# Patient Record
Sex: Female | Born: 1980
Health system: Southern US, Community
[De-identification: ages and names within clinical notes are randomized; demographics above are authoritative.]

## PROBLEM LIST (undated history)

## (undated) ENCOUNTER — Ambulatory Visit: Admission: EM | Payer: Commercial Managed Care - PPO | Source: Home / Self Care

## (undated) DIAGNOSIS — Z87442 Personal history of urinary calculi: Secondary | ICD-10-CM

## (undated) DIAGNOSIS — N941 Unspecified dyspareunia: Secondary | ICD-10-CM

## (undated) DIAGNOSIS — N946 Dysmenorrhea, unspecified: Secondary | ICD-10-CM

## (undated) DIAGNOSIS — R87629 Unspecified abnormal cytological findings in specimens from vagina: Secondary | ICD-10-CM

## (undated) DIAGNOSIS — M329 Systemic lupus erythematosus, unspecified: Secondary | ICD-10-CM

## (undated) DIAGNOSIS — M5030 Other cervical disc degeneration, unspecified cervical region: Secondary | ICD-10-CM

## (undated) DIAGNOSIS — J383 Other diseases of vocal cords: Secondary | ICD-10-CM

## (undated) DIAGNOSIS — N920 Excessive and frequent menstruation with regular cycle: Secondary | ICD-10-CM

## (undated) DIAGNOSIS — N809 Endometriosis, unspecified: Secondary | ICD-10-CM

## (undated) DIAGNOSIS — G4730 Sleep apnea, unspecified: Secondary | ICD-10-CM

## (undated) DIAGNOSIS — R51 Headache: Secondary | ICD-10-CM

## (undated) DIAGNOSIS — Z842 Family history of other diseases of the genitourinary system: Secondary | ICD-10-CM

## (undated) DIAGNOSIS — Z720 Tobacco use: Secondary | ICD-10-CM

## (undated) DIAGNOSIS — F419 Anxiety disorder, unspecified: Secondary | ICD-10-CM

## (undated) DIAGNOSIS — T7840XA Allergy, unspecified, initial encounter: Secondary | ICD-10-CM

## (undated) DIAGNOSIS — IMO0002 Reserved for concepts with insufficient information to code with codable children: Secondary | ICD-10-CM

## (undated) DIAGNOSIS — K449 Diaphragmatic hernia without obstruction or gangrene: Secondary | ICD-10-CM

## (undated) DIAGNOSIS — A749 Chlamydial infection, unspecified: Secondary | ICD-10-CM

## (undated) DIAGNOSIS — I10 Essential (primary) hypertension: Secondary | ICD-10-CM

## (undated) DIAGNOSIS — R519 Headache, unspecified: Secondary | ICD-10-CM

## (undated) DIAGNOSIS — E669 Obesity, unspecified: Secondary | ICD-10-CM

## (undated) DIAGNOSIS — K219 Gastro-esophageal reflux disease without esophagitis: Secondary | ICD-10-CM

## (undated) DIAGNOSIS — D649 Anemia, unspecified: Secondary | ICD-10-CM

## (undated) HISTORY — DX: Family history of other diseases of the genitourinary system: Z84.2

## (undated) HISTORY — DX: Headache, unspecified: R51.9

## (undated) HISTORY — DX: Unspecified dyspareunia: N94.10

## (undated) HISTORY — PX: WISDOM TOOTH EXTRACTION: SHX21

## (undated) HISTORY — DX: Endometriosis, unspecified: N80.9

## (undated) HISTORY — DX: Dysmenorrhea, unspecified: N94.6

## (undated) HISTORY — DX: Other diseases of vocal cords: J38.3

## (undated) HISTORY — DX: Anemia, unspecified: D64.9

## (undated) HISTORY — PX: DILATION AND CURETTAGE OF UTERUS: SHX78

## (undated) HISTORY — DX: Systemic lupus erythematosus, unspecified: M32.9

## (undated) HISTORY — DX: Excessive and frequent menstruation with regular cycle: N92.0

## (undated) HISTORY — DX: Headache: R51

## (undated) HISTORY — DX: Diaphragmatic hernia without obstruction or gangrene: K44.9

## (undated) HISTORY — DX: Obesity, unspecified: E66.9

## (undated) HISTORY — DX: Tobacco use: Z72.0

## (undated) HISTORY — DX: Reserved for concepts with insufficient information to code with codable children: IMO0002

## (undated) HISTORY — DX: Sleep apnea, unspecified: G47.30

## (undated) HISTORY — DX: Personal history of urinary calculi: Z87.442

## (undated) HISTORY — DX: Gastro-esophageal reflux disease without esophagitis: K21.9

## (undated) HISTORY — PX: ROOT CANAL: SHX2363

## (undated) HISTORY — DX: Allergy, unspecified, initial encounter: T78.40XA

## (undated) HISTORY — PX: BONE MARROW BIOPSY: SHX199

## (undated) HISTORY — DX: Chlamydial infection, unspecified: A74.9

## (undated) HISTORY — DX: Anxiety disorder, unspecified: F41.9

## (undated) HISTORY — DX: Essential (primary) hypertension: I10

## (undated) HISTORY — PX: OTHER SURGICAL HISTORY: SHX169

## (undated) HISTORY — DX: Unspecified abnormal cytological findings in specimens from vagina: R87.629

## (undated) HISTORY — PX: DIAGNOSTIC LAPAROSCOPY: SUR761

## (undated) NOTE — ED Provider Notes (Signed)
 Formatting of this note is different from the original.   EMERGENCY DEPARTMENT ENCOUNTER    CHIEF COMPLAINT   Chief Complaint  Patient presents with  ? Cough   HPI    Molly Stone is a 16 y.o. female who presents with cough, wheezing over the past week.  Reports that she has a history of asthma, but was recently diagnosed with COVID-19.  Reports that her grandmother had COVID-19 and just passed away last weekend from a stroke.  Believes she got COVID from her.  Reports that she has been wheezing, has now loss of voice and worsening cough, bronchospasms.  Has not Advair inhaler which she has been using daily as prescribed.  Also has an albuterol  inhaler, but has not been using it.  Reports that she is changing physicians and was not sure if this medication is something she should take or not.  No chest pain or shortness of breath.  No nausea, vomiting or diarrhea.  No abdominal pain.  No urinary symptoms.  Patient with a history of asthma, SVT.  PAST MEDICAL HISTORY   Past Medical History:  Diagnosis Date  ? Asthma   ? SVT (supraventricular tachycardia)    SURGICAL HISTORY   No past surgical history on file.  CURRENT MEDICATIONS   Current Facility-Administered Medications  Medication Dose Route Frequency Provider Last Rate Last Admin  ? predniSONE  (DELTASONE ) tablet 50 mg  50 mg Oral Once Susy LITTIE Bihari, FNP       Current Outpatient Medications  Medication Sig Dispense Refill  ? albuterol  HFA 90 mcg/actuation inhaler Inhale 2 puffs into the lungs every 6 (six) hours as needed for Wheezing.    ? azelastine  (ASTELIN ) 137 mcg (0.1 %) nasal spray 1 spray by Nasal route 2 (two) times daily. Use in each nostril as directed    ? benzonatate  (TESSALON ) 100 MG capsule Take 1 capsule (100 mg total) by mouth every 8 (eight) hours for 7 days. 21 capsule 0  ? fluticasone  propion-salmeteroL (ADVAIR) 250-50 mcg/dose diskus inhaler Inhale 1 puff into the lungs every 12 (twelve) hours.    ?  fluticasone  propionate (FLONASE ) 50 mcg/actuation nasal spray 1 spray by Nasal route daily. Spray in each nostril.    ? montelukast  (SINGULAIR ) 10 mg tablet Take 10 mg by mouth nightly.    ? nebivoloL  (BYSTOLIC ) 10 MG tablet Take 10 mg by mouth daily.    ? pantoprazole  (PROTONIX ) 20 MG tablet Take 20 mg by mouth daily.     ALLERGIES   Allergies  Allergen Reactions  ? Buprenorphine Hcl Other (See Comments)  ? Morphine  Other (See Comments)  ? Prednisone  Other (See Comments)  ? Tramadol  Other (See Comments)   FAMILY HISTORY   No family history on file.  SOCIAL HISTORY   Social History   Socioeconomic History  ? Marital status: Married    Spouse name: Not on file  ? Number of children: Not on file  ? Years of education: Not on file  ? Highest education level: Not on file  Occupational History  ? Not on file  Tobacco Use  ? Smoking status: Not on file  ? Smokeless tobacco: Not on file  Substance and Sexual Activity  ? Alcohol use: Not on file  ? Drug use: Not on file  ? Sexual activity: Not on file  Other Topics Concern  ? Not on file  Social History Narrative  ? Not on file   Social Determinants of Health   Financial Resource Strain:  Not on file  Food Insecurity: Not on file  Transportation Needs: Not on file  Social Connections: Not on file  Housing Stability: Not on file   REVIEW OF SYSTEMS   See HPI for further details.  All other systems reviewed and otherwise negative.  PHYSICAL EXAM   VITAL SIGNS: BP 123/76   Pulse 92   Temp 98.8 F (37.1 C)   Resp 20   SpO2 95%  CONST: appears comfortable.  No distress VS are noted HEAD:   [place]/AT EYES:  PERRL.EOMI. Conjunctiva normal bilaterally. Sclera anicteric.  ENT:  NP- no discharge or epistaxis. OP-mmm. No erythema or exudate. NECK:  supple. No significant lymphadenopathy. No midline tenderness, crepitus, or step-off. CV:  RRR. No M,R,G. 2 + radial and pedal pulses bilaterally.  RESPIRATORY CHEST:  Diminished  breath sounds, wheezes to the lower lobes bilaterally.  No increased work of breathing. No chestwall TTP, crepitus, or step off. ABD:  soft, NT, ND, NABS. No HSM BACK:  No midline tenderness, crepitus, or step-off. No CVAT UPPER EXTREMITY:  No deformity, clubbing, cyanosis, edema. LOWER EXTREMITY:  No deformity, clubbing, cyanosis, edema. Equal calf circumference bilaterally. NEURO: Normal mental status, grossly non-focal. LYMPHATIC:  No cervical lymphadenopathy. SKIN:  Warm and dry. No rashes.  RADIOLOGY, LABORATORY STUDIES, AND PROCEDURES  Laboratory studies reviewed and interpreted by myself with the following notable findings:    COVID positive, influenza, RSV negative.  Strep negative.  Radiology studies reviewed and interpreted by myself with the following notable findings:    CXR:  IMPRESSION:   MINIMAL INTERSTITIAL PROMINENCE OF THE LUNG BASES.  ED COURSE & MEDICAL DECISION MAKING   14 F reports today for wheezing, cough, congestion, bronchospasm, loss of voice over the past several days.  Recently had a loss of a family member last weekend, her grandmother who was positive for COVID-19.  Patient reports she tested positive few days later.  Patient reports that she is having worsening cough, congestion, wheezing.  Felt she should be checked out.  She denies any chest pain or shortness of breath.  Has been using her Advair inhaler.  On exam patient with diminished breath sounds, few expiratory wheezes.  She is positive for COVID-19.  Have recommended to her to do her inhaler treatments at home.  She has in no distress.  She is stable.  Also discussed with her that we will give her prednisone  for the next few days for asthma exacerbation.  Also Tessalon  for cough.  Patient verbalizes understanding for return precautions, she is appreciative.  She is stable. Pertinent labs & imaging studies reviewed. (See chart for details)  Medications Provided in Emergency Department:   Prednisone   Prescriptions Provided at Discharge:  Prednisone   Social Determinants of Health/Special Considerations:  None reported  Disposition:  Home  FINAL IMPRESSION   COVID-19  Wheezing Cough   Portions of this note may be dictated using Dragon Naturally Speaking voice recognition software.  Variances in spelling and vocabulary are possible and unintentional.  Not all errors are caught/corrected.  Please notify the dino if any discrepancies are noted or if the meaning of any statement is not clear.  Susy LITTIE Bihari, FNP 01/18/23 1229  Cosigned by Lamar LITTIE Salt, DO at 01/19/2023 12:14 PM EDT Electronically signed by Susy LITTIE Bihari, FNP at 01/18/2023 12:29 PM EDT Electronically signed by Lamar LITTIE Salt, DO at 01/19/2023 12:14 PM EDT

---
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
## 2004-08-25 ENCOUNTER — Emergency Department: Payer: Self-pay | Admitting: Emergency Medicine

## 2004-08-26 ENCOUNTER — Emergency Department: Payer: Self-pay | Admitting: Emergency Medicine

## 2004-09-23 ENCOUNTER — Emergency Department: Payer: Self-pay | Admitting: Emergency Medicine

## 2004-10-01 ENCOUNTER — Emergency Department: Payer: Self-pay | Admitting: Emergency Medicine

## 2005-01-29 ENCOUNTER — Observation Stay: Payer: Self-pay

## 2005-01-30 ENCOUNTER — Observation Stay: Payer: Self-pay | Admitting: Unknown Physician Specialty

## 2005-02-11 ENCOUNTER — Observation Stay: Payer: Self-pay

## 2005-02-24 ENCOUNTER — Observation Stay: Payer: Self-pay

## 2005-03-04 ENCOUNTER — Observation Stay: Payer: Self-pay

## 2005-03-07 ENCOUNTER — Observation Stay: Payer: Self-pay

## 2005-03-16 ENCOUNTER — Observation Stay: Payer: Self-pay | Admitting: Unknown Physician Specialty

## 2005-03-22 ENCOUNTER — Observation Stay: Payer: Self-pay

## 2005-03-26 ENCOUNTER — Observation Stay: Payer: Self-pay

## 2005-04-02 ENCOUNTER — Observation Stay: Payer: Self-pay | Admitting: Unknown Physician Specialty

## 2005-04-05 ENCOUNTER — Observation Stay: Payer: Self-pay | Admitting: Obstetrics & Gynecology

## 2005-04-07 ENCOUNTER — Inpatient Hospital Stay: Payer: Self-pay | Admitting: Unknown Physician Specialty

## 2005-04-10 ENCOUNTER — Emergency Department: Payer: Self-pay | Admitting: Emergency Medicine

## 2005-04-10 ENCOUNTER — Other Ambulatory Visit: Payer: Self-pay

## 2006-03-18 ENCOUNTER — Observation Stay: Payer: Self-pay

## 2006-04-15 ENCOUNTER — Observation Stay: Payer: Self-pay

## 2006-04-18 ENCOUNTER — Inpatient Hospital Stay: Payer: Self-pay

## 2006-06-14 ENCOUNTER — Emergency Department: Payer: Self-pay | Admitting: Unknown Physician Specialty

## 2006-11-02 ENCOUNTER — Emergency Department: Payer: Self-pay | Admitting: Emergency Medicine

## 2007-01-20 ENCOUNTER — Emergency Department: Payer: Self-pay | Admitting: Emergency Medicine

## 2007-01-28 ENCOUNTER — Emergency Department: Payer: Self-pay | Admitting: General Practice

## 2007-02-19 ENCOUNTER — Other Ambulatory Visit: Payer: Self-pay

## 2007-02-19 ENCOUNTER — Emergency Department: Payer: Self-pay | Admitting: Emergency Medicine

## 2007-04-09 ENCOUNTER — Emergency Department: Payer: Self-pay | Admitting: Emergency Medicine

## 2007-05-27 ENCOUNTER — Ambulatory Visit: Payer: Self-pay

## 2007-08-14 ENCOUNTER — Ambulatory Visit: Payer: Self-pay | Admitting: Family Medicine

## 2007-08-22 ENCOUNTER — Emergency Department: Payer: Self-pay | Admitting: Emergency Medicine

## 2007-08-27 ENCOUNTER — Ambulatory Visit: Payer: Self-pay | Admitting: Family Medicine

## 2007-09-15 ENCOUNTER — Ambulatory Visit: Payer: Self-pay | Admitting: Family Medicine

## 2007-09-26 ENCOUNTER — Emergency Department: Payer: Self-pay | Admitting: Emergency Medicine

## 2008-05-31 ENCOUNTER — Emergency Department: Payer: Self-pay | Admitting: Emergency Medicine

## 2009-02-02 ENCOUNTER — Emergency Department: Payer: Self-pay | Admitting: Unknown Physician Specialty

## 2009-06-09 ENCOUNTER — Emergency Department: Payer: Self-pay | Admitting: Emergency Medicine

## 2010-08-18 ENCOUNTER — Emergency Department: Payer: Self-pay | Admitting: Unknown Physician Specialty

## 2012-03-18 ENCOUNTER — Ambulatory Visit (INDEPENDENT_AMBULATORY_CARE_PROVIDER_SITE_OTHER): Payer: BC Managed Care – PPO | Admitting: Internal Medicine

## 2012-03-18 ENCOUNTER — Encounter: Payer: Self-pay | Admitting: Internal Medicine

## 2012-03-18 VITALS — BP 120/70 | HR 110 | Temp 98.7°F | Ht 68.0 in | Wt 275.2 lb

## 2012-03-18 DIAGNOSIS — N920 Excessive and frequent menstruation with regular cycle: Secondary | ICD-10-CM

## 2012-03-18 DIAGNOSIS — N63 Unspecified lump in unspecified breast: Secondary | ICD-10-CM

## 2012-03-18 DIAGNOSIS — Z8639 Personal history of other endocrine, nutritional and metabolic disease: Secondary | ICD-10-CM

## 2012-03-18 DIAGNOSIS — Z87898 Personal history of other specified conditions: Secondary | ICD-10-CM

## 2012-03-18 DIAGNOSIS — E559 Vitamin D deficiency, unspecified: Secondary | ICD-10-CM

## 2012-03-18 DIAGNOSIS — R928 Other abnormal and inconclusive findings on diagnostic imaging of breast: Secondary | ICD-10-CM

## 2012-03-18 DIAGNOSIS — E669 Obesity, unspecified: Secondary | ICD-10-CM

## 2012-03-18 DIAGNOSIS — N6459 Other signs and symptoms in breast: Secondary | ICD-10-CM

## 2012-03-18 DIAGNOSIS — K219 Gastro-esophageal reflux disease without esophagitis: Secondary | ICD-10-CM

## 2012-03-18 DIAGNOSIS — I10 Essential (primary) hypertension: Secondary | ICD-10-CM

## 2012-03-18 NOTE — Patient Instructions (Addendum)
We will refer you to Dermatology and Dr Luella Cook following your breast mammogram.  Return for fasting lipids as soon as it is convenient        Consider the Low Glycemic Index Diet and 6 smaller meals daily .  This boosts your metabolism and regulates your sugars:   7 AM Low carbohydrate Protein  Shakes (EAS Carb Control  Or Atkins ,  Available everywhere,   In  cases at [employer] )  2.5 carbs  (Add or substitute a toasted sandwhich thin w/ peanut butter)  10 AM: Protein bar by Atkins (snack size,  Chocolate lover's variety at  [employer])    Lunch: sandwich on pita bread or flatbread (Joseph's makes a pita bread and a flat bread , available at [employer] and [employer]; Toufayah makes a low carb flatbread available at [employer] and [employer]) Mission makes a low carb whole wheat tortilla available at [employer] most grocery stores   3 PM:  Mid day :  Another protein bar,  Or a  cheese stick, 1/4 cup of almonds, walnuts, pistachios, pecans, peanuts,  Macadamia nuts  6 PM  Dinner:  "mean and green:"  Meat/chicken/fish, salad, and green veggie : use ranch, vinagrette,  Blue cheese, etc  9 PM snack : Breyer's low carb fudgsicle or  ice cream bar (Carb Smart), or  Weight Watcher's ice cream bar , or another protein shake
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
## 2012-03-19 ENCOUNTER — Other Ambulatory Visit (INDEPENDENT_AMBULATORY_CARE_PROVIDER_SITE_OTHER): Payer: BC Managed Care – PPO | Admitting: *Deleted

## 2012-03-19 ENCOUNTER — Ambulatory Visit: Payer: Self-pay | Admitting: Internal Medicine

## 2012-03-19 DIAGNOSIS — E669 Obesity, unspecified: Secondary | ICD-10-CM

## 2012-03-19 DIAGNOSIS — N920 Excessive and frequent menstruation with regular cycle: Secondary | ICD-10-CM

## 2012-03-19 DIAGNOSIS — E559 Vitamin D deficiency, unspecified: Secondary | ICD-10-CM

## 2012-03-19 LAB — CBC WITH DIFFERENTIAL/PLATELET
Basophils Relative: 0.3 % (ref 0.0–3.0)
Eosinophils Absolute: 0.9 10*3/uL — ABNORMAL HIGH (ref 0.0–0.7)
Eosinophils Relative: 11.2 % — ABNORMAL HIGH (ref 0.0–5.0)
HCT: 41 % (ref 36.0–46.0)
Hemoglobin: 13.7 g/dL (ref 12.0–15.0)
Lymphs Abs: 2.3 10*3/uL (ref 0.7–4.0)
MCHC: 33.5 g/dL (ref 30.0–36.0)
MCV: 89.4 fl (ref 78.0–100.0)
Monocytes Absolute: 0.5 10*3/uL (ref 0.1–1.0)
Neutro Abs: 4.1 10*3/uL (ref 1.4–7.7)
Neutrophils Relative %: 52.2 % (ref 43.0–77.0)
Platelets: 155 10*3/uL (ref 150.0–400.0)
WBC: 7.8 10*3/uL (ref 4.5–10.5)

## 2012-03-19 LAB — LIPID PANEL
Cholesterol: 188 mg/dL (ref 0–200)
LDL Cholesterol: 125 mg/dL — ABNORMAL HIGH (ref 0–99)
Triglycerides: 51 mg/dL (ref 0.0–149.0)
VLDL: 10.2 mg/dL (ref 0.0–40.0)

## 2012-03-19 LAB — TSH: TSH: 1.39 u[IU]/mL (ref 0.35–5.50)

## 2012-03-20 LAB — COMPLETE METABOLIC PANEL WITH GFR
ALT: 16 U/L (ref 0–35)
BUN: 11 mg/dL (ref 6–23)
CO2: 24 mEq/L (ref 19–32)
Calcium: 8.8 mg/dL (ref 8.4–10.5)
Chloride: 107 mEq/L (ref 96–112)
Creat: 0.68 mg/dL (ref 0.50–1.10)
GFR, Est African American: >89 mL/min
GFR, Est Non African American: >89 mL/min
Sodium: 140 mEq/L (ref 135–145)
Total Bilirubin: 0.4 mg/dL (ref 0.3–1.2)
Total Protein: 6.6 g/dL (ref 6.0–8.3)

## 2012-03-20 LAB — VITAMIN D 25 HYDROXY (VIT D DEFICIENCY, FRACTURES): Vit D, 25-Hydroxy: 27 ng/mL — ABNORMAL LOW (ref 30–89)

## 2012-03-21 ENCOUNTER — Encounter: Payer: Self-pay | Admitting: Internal Medicine

## 2012-03-21 DIAGNOSIS — E669 Obesity, unspecified: Secondary | ICD-10-CM | POA: Insufficient documentation

## 2012-03-22 ENCOUNTER — Encounter: Payer: Self-pay | Admitting: Internal Medicine

## 2012-03-22 DIAGNOSIS — N6459 Other signs and symptoms in breast: Secondary | ICD-10-CM | POA: Insufficient documentation

## 2012-03-22 DIAGNOSIS — K219 Gastro-esophageal reflux disease without esophagitis: Secondary | ICD-10-CM | POA: Insufficient documentation

## 2012-03-22 DIAGNOSIS — I10 Essential (primary) hypertension: Secondary | ICD-10-CM | POA: Insufficient documentation

## 2012-03-22 DIAGNOSIS — Z87442 Personal history of urinary calculi: Secondary | ICD-10-CM | POA: Insufficient documentation

## 2012-03-22 NOTE — Assessment & Plan Note (Signed)
Her Pre conception weight was 180, and she has had subsequent gain with each pregnancy .  I have addressed  BMI and recommended a low glycemic index diet utilizing smaller more frequent meals to increase metabolism.  I have also recommended that patient start exercising with a goal of 30 minutes of aerobic exercise a minimum of 5 days per week. Screening for lipid disorders, thyroid and diabetes to be done today.

## 2012-03-22 NOTE — Assessment & Plan Note (Signed)
Per patient, history of.  No signs on today's exam of hypertension.,

## 2012-03-22 NOTE — Assessment & Plan Note (Addendum)
Due to the spreading macular rash on her right breast, she was sent for diagnositc mammogram and ultrasound which were negative for massess.  She will need a skin punch biopsy to rule out both inflammatory breast cancer and other nonmalignant skin conditions.  Will refer to Dr. Anthonette Legato in [place].

## 2012-03-22 NOTE — Assessment & Plan Note (Addendum)
Recommended weight loss, avoidance of fruit juices and trial of OTC omeprazole prior to swallow evaluation

## 2012-03-23 ENCOUNTER — Other Ambulatory Visit: Payer: Self-pay | Admitting: Internal Medicine

## 2012-03-23 DIAGNOSIS — R21 Rash and other nonspecific skin eruption: Secondary | ICD-10-CM

## 2012-03-25 ENCOUNTER — Telehealth: Payer: Self-pay | Admitting: *Deleted

## 2012-03-25 NOTE — Telephone Encounter (Signed)
Patient notified

## 2012-03-25 NOTE — Telephone Encounter (Signed)
Patient called and is asking if you have seen her mamm and ultrasound from last week.

## 2012-03-25 NOTE — Telephone Encounter (Signed)
Yes they were both normal.  No masses, referral to dermatology is underway

## 2012-03-25 NOTE — Telephone Encounter (Signed)
Left message asking patient to return call. 

## 2012-03-30 ENCOUNTER — Encounter: Payer: Self-pay | Admitting: Internal Medicine

## 2012-04-07 ENCOUNTER — Telehealth: Payer: Self-pay | Admitting: Internal Medicine

## 2012-04-07 NOTE — Telephone Encounter (Signed)
Caller: Liel/Patient; PCP: Duncan Dull; CB#: [phone]; ; ; Call regarding Back Pain;  Onset- 04/06/12  Afebrile. Pt c/o of back pain. Emergent s/s of Back pain s/s protocol r/o. Pt to see the provider within 24 hrs. Call disconnected. Called pt back several times no answer left her a detailed message to call for an appt and care advice.

## 2012-04-08 ENCOUNTER — Ambulatory Visit (INDEPENDENT_AMBULATORY_CARE_PROVIDER_SITE_OTHER): Payer: BC Managed Care – PPO | Admitting: Internal Medicine

## 2012-04-08 ENCOUNTER — Encounter: Payer: Self-pay | Admitting: Internal Medicine

## 2012-04-08 VITALS — BP 118/80 | HR 98 | Temp 97.9°F | Resp 18 | Wt 274.2 lb

## 2012-04-08 DIAGNOSIS — J309 Allergic rhinitis, unspecified: Secondary | ICD-10-CM

## 2012-04-08 DIAGNOSIS — M545 Low back pain, unspecified: Secondary | ICD-10-CM | POA: Insufficient documentation

## 2012-04-08 DIAGNOSIS — N6459 Other signs and symptoms in breast: Secondary | ICD-10-CM

## 2012-04-08 DIAGNOSIS — E559 Vitamin D deficiency, unspecified: Secondary | ICD-10-CM

## 2012-04-08 MED ORDER — PREDNISONE (PAK) 10 MG PO TABS: ORAL_TABLET | ORAL | Status: AC

## 2012-04-08 MED ORDER — TRAMADOL HCL 50 MG PO TABS: 50.0000 mg | ORAL_TABLET | Freq: Four times a day (QID) | ORAL | Status: AC | PRN

## 2012-04-08 MED ORDER — METHOCARBAMOL 750 MG PO TABS: 750.0000 mg | ORAL_TABLET | Freq: Every evening | ORAL | Status: AC | PRN

## 2012-04-08 NOTE — Progress Notes (Signed)
Patient ID: Molly Stone, female   DOB: 23-Mar-1981, 31 y.o.   MRN: [id number] Patient Active Problem List  Diagnosis  . Obesity (BMI 30-39.9)  . History of nephrolithiasis  . GERD (gastroesophageal reflux disease)  . Abnormal breast exam  . Lumbago syndrome  . Rhinitis, allergic  . Vitamin d deficiency    Subjective:  CC:   Chief Complaint  Patient presents with  . Back Pain    HPI:   Molly Stone is a 31 y.o. female who presents  With new onset low back pain and follow up on multiple prior issues.  The back pain started 2 days prior to presentation.  There is no history of fall or unusual activity.  She describes it as severe, , persistent,  Dull in nature and non radiating except to both sacroiliac joints.  She has no urinary frequency or dysuria and no flank pain.  She is not constipated.  No prior episodes of LBP but used to see a chiropractor for upper back issues.  Pain is improved with lying flat on back in bed. Aggravated and becomes a sharp pain with sneeze or cough.  No cough, shortness of breath.,  No numbness in legs. 2nd issue is follow up on breast rash which was evaluated 2 weeks ago.  She has been referred to dermatology after mammogram was negative for masses, but has not seen the dermatologist yet.  The rash is still present but less bothersome. Her liver and kidney dysfunction previously noted during an ER visit were reevaluated with labs and were not present.  She was noted to be slightly low in vitamin D  Deficient/  She does have some sun exposure  And no history of fracture or falls.     Past Medical History  Diagnosis Date  . Allergy   . Asthma   . Obesity (BMI 30-39.9)   . History of nephrolithiasis   . GERD (gastroesophageal reflux disease)   . Hypertension   . Jaundice, physiologic, newborn     Past Surgical History  Procedure Date  . Dilation and curettage of uterus   . Wisdom tooth extraction          The following portions of the  patient's history were reviewed and updated as appropriate: Allergies, current medications, and problem list.    Review of Systems:  The remainder of the  review of systems was negative except those addressed in the HPI,     History   Social History  . Marital Status: Married    Spouse Name: N/A    Number of Children: N/A  . Years of Education: N/A   Occupational History  . Not on file.   Social History Main Topics  . Smoking status: Current Some Day Smoker    Types: Cigarettes  . Smokeless tobacco: Never Used  . Alcohol Use: Yes     wine occassionally  . Drug Use: No  . Sexually Active: Not on file   Other Topics Concern  . Not on file   Social History Narrative  . No narrative on file    Objective:  BP 118/80  Pulse 98  Temp 97.9 F (36.6 C) (Oral)  Resp 18  Wt 274 lb 4 oz (124.399 kg)  SpO2 97%  LMP 02/29/2012  General appearance: obese, alert, cooperative and appears stated age Neck: no adenopathy, no carotid bruit, supple, symmetrical, trachea midline and thyroid not enlarged, symmetric, no tenderness/mass/nodules Back: symmetric, some paraspinous muscle spasm bilaterally thoracolumbar area,  No vertebral tenderness.  ROM restricted.Marland Kitchen No CVA tenderness. Lungs: clear to auscultation bilaterally Heart: regular rate and rhythm, S1, S2 normal, no murmur, click, rub or gallop Abdomen: soft, non-tender; bowel sounds normal; no masses,  no organomegaly Pulses: 2+ and symmetric Skin: Skin color, texture, turgor normal. Persistent breast rash noted.   Assessment and Plan:  Lumbago syndrome She has non radiating pain, likely due to strain resulting in muscle spasm, aggravated by weight and frequent lifting of her small children.  Prednisone, MR., pain reliever.  If no improvement in 2 weeks,  Plain films.   Rhinitis, allergic She is currently having congestion, sneezing and watery eyes   Resume zyrtec.   Abnormal breast exam Mammogram/ultrasound was  negative for masses given concern for inflammatory breast disease.  She has eosinophilia, relative on recent CBC.,  Referral to dermatology for biopsy made.   Vitamin d deficiency Mild but persistent.  Recommended increasing dose of Vit d to 2000 unis daily and recheck in 3 months.    Updated Medication List Outpatient Encounter Prescriptions as of 04/08/2012  Medication Sig Dispense Refill  . cetirizine (ZYRTEC) 10 MG tablet Take 10 mg by mouth daily.      . cholecalciferol (VITAMIN D) 1000 UNITS tablet Take 1,000 Units by mouth daily.      . methocarbamol (ROBAXIN-750) 750 MG tablet Take 1 tablet (750 mg total) by mouth at bedtime as needed (muscle spasm).  30 tablet  0  . predniSONE (STERAPRED UNI-PAK) 10 MG tablet 6 tablets on Day 1 , then reduce by 1 tablet daily until gone  21 tablet  0  . traMADol (ULTRAM) 50 MG tablet Take 1 tablet (50 mg total) by mouth every 6 (six) hours as needed for pain. Maximum of 6 daily  120 tablet  0     No orders of the defined types were placed in this encounter.    Return in about 2 weeks (around 04/22/2012).

## 2012-04-08 NOTE — Patient Instructions (Addendum)
I am prescribing a prednisone taper  , a muscle relaxer for bedtime use,  And tramadol to use for pain.  You may add tylenol but not mrotrin to this  Resume zyrtec for allergies.

## 2012-04-12 ENCOUNTER — Encounter: Payer: Self-pay | Admitting: Internal Medicine

## 2012-04-12 DIAGNOSIS — J309 Allergic rhinitis, unspecified: Secondary | ICD-10-CM | POA: Insufficient documentation

## 2012-04-12 DIAGNOSIS — E559 Vitamin D deficiency, unspecified: Secondary | ICD-10-CM | POA: Insufficient documentation

## 2012-04-12 DIAGNOSIS — J3089 Other allergic rhinitis: Secondary | ICD-10-CM | POA: Insufficient documentation

## 2012-04-12 NOTE — Assessment & Plan Note (Signed)
She is currently having congestion, sneezing and watery eyes   Resume zyrtec.

## 2012-04-12 NOTE — Assessment & Plan Note (Signed)
Mammogram/ultrasound was negative for masses given concern for inflammatory breast disease.  She has eosinophilia, relative on recent CBC.,  Referral to dermatology for biopsy made.

## 2012-04-12 NOTE — Assessment & Plan Note (Signed)
She has non radiating pain, likely due to strain resulting in muscle spasm, aggravated by weight and frequent lifting of her small children.  Prednisone, MR., pain reliever.  If no improvement in 2 weeks,  Plain films.

## 2012-04-12 NOTE — Assessment & Plan Note (Signed)
Mild but persistent.  Recommended increasing dose of Vit d to 2000 unis daily and recheck in 3 months.

## 2012-04-13 ENCOUNTER — Telehealth: Payer: Self-pay | Admitting: Internal Medicine

## 2012-04-13 DIAGNOSIS — M545 Low back pain, unspecified: Secondary | ICD-10-CM

## 2012-04-13 NOTE — Telephone Encounter (Signed)
Please have her get lumbar spine films prior to appt.  Ordered in [employer]

## 2012-04-13 NOTE — Telephone Encounter (Signed)
Caller: [place]/Patient is calling about lower Back Pain not improving after meds given by MD on 6-19.  PCP [place]. Pain is more intense at night. Pt has finished Prednisone. Afebrile. Pt has f/u appt on 7-1. All emergent sxs r/o per Back Symptoms Protocol.  Advised Pt to continue w/ prescribed Tramadol, apply ice switching to heat, avoid lifting and c/b if sxs worsen or didn't improve w/n 72 hrs.  Pt verbalized understanding.

## 2012-04-13 NOTE — Telephone Encounter (Signed)
Left message notifying patient that order has been entered and that she can go to [facility] office.

## 2012-04-14 ENCOUNTER — Ambulatory Visit (INDEPENDENT_AMBULATORY_CARE_PROVIDER_SITE_OTHER)
Admission: RE | Admit: 2012-04-14 | Discharge: 2012-04-14 | Disposition: A | Discharge status: Home / Self Care | Payer: BC Managed Care – PPO | Source: Ambulatory Visit | Attending: Internal Medicine | Admitting: Internal Medicine

## 2012-04-14 DIAGNOSIS — M545 Low back pain, unspecified: Secondary | ICD-10-CM

## 2012-04-20 ENCOUNTER — Ambulatory Visit (INDEPENDENT_AMBULATORY_CARE_PROVIDER_SITE_OTHER): Payer: BC Managed Care – PPO | Admitting: Internal Medicine

## 2012-04-20 ENCOUNTER — Telehealth: Payer: Self-pay | Admitting: Internal Medicine

## 2012-04-20 ENCOUNTER — Encounter: Payer: Self-pay | Admitting: Internal Medicine

## 2012-04-20 VITALS — BP 112/76 | HR 97 | Temp 98.0°F | Resp 16 | Wt 276.5 lb

## 2012-04-20 DIAGNOSIS — E669 Obesity, unspecified: Secondary | ICD-10-CM

## 2012-04-20 DIAGNOSIS — M545 Low back pain, unspecified: Secondary | ICD-10-CM

## 2012-04-20 DIAGNOSIS — N6459 Other signs and symptoms in breast: Secondary | ICD-10-CM

## 2012-04-20 NOTE — Telephone Encounter (Signed)
Does either [place] or [place] has a Psychologist, counselling programs for non diabetics?

## 2012-04-20 NOTE — Assessment & Plan Note (Addendum)
Low glycemic index diet recommended again.  Referral to [facility] tried but she does not have diabetes,  Will look for program in [place] or [place]

## 2012-04-20 NOTE — Telephone Encounter (Signed)
YES [facility] DOES.  I WILL TRY TO SCHEDULE THE PATIENT THERE

## 2012-04-20 NOTE — Assessment & Plan Note (Addendum)
With normal  X rays,  Referred to chiropractor since she has no copay with them.  The bilateral subjective  numbness, given her pannus, may be lateral cutaneous femoral nerve syndrome, given her normal exam. .

## 2012-04-20 NOTE — Patient Instructions (Addendum)
I do recommend seeing your chiropractor for your low back and losing weight which will help,  We will call you when we get the referral to the [facility]

## 2012-04-20 NOTE — Telephone Encounter (Signed)
[facility] will not see the patient for obesity.  She has to have diabetes for them to see her.  Where else would you like me to refer her

## 2012-04-20 NOTE — Assessment & Plan Note (Signed)
Mammogram was normal and reash is resolved.  Currently appears reticular with capillary rash.  Not using any steroid cream in weeks.  Dermatology appt in July

## 2012-04-20 NOTE — Progress Notes (Signed)
Patient ID: Molly Stone, female   DOB: Mar 05, 1981, 31 y.o.   MRN: [id number]   Patient Active Problem List  Diagnosis  . Obesity (BMI 30-39.9)  . History of nephrolithiasis  . GERD (gastroesophageal reflux disease)  . Abnormal breast exam  . Lumbago syndrome  . Rhinitis, allergic  . Vitamin d deficiency    Subjective:  CC:   Chief Complaint  Patient presents with  . Follow-up    one month    HPI:   Molly Stone is a 31 y.o. female who presents for follow up on multiple chronic problems. Her back pain has resolved but she continues to have episodes of bilateral numbness in thighs.  No history of trauma, falls or malignancy.  Lumbar spine films were completely normal. 2) Breast rash has improved significantly, appointment  with dermatology was moved out to July by derm office.  Not using anything on rash currently.Marland Kitchen  3) Having trouble losing weight.  She was able to maintain a normal weight until her last pregnancy.  She has not started the ow glycemic index diet due to family stressors. No signs of diabetes or thyroid abnormalities on screening labs. Not exercising regularly due to care of 4 children but is active physically without chest pain, dyspnea.    Past Medical History  Diagnosis Date  . Allergy   . Asthma   . Obesity (BMI 30-39.9)   . History of nephrolithiasis   . GERD (gastroesophageal reflux disease)   . Hypertension   . Jaundice, physiologic, newborn     Past Surgical History  Procedure Date  . Dilation and curettage of uterus   . Wisdom tooth extraction          The following portions of the patient's history were reviewed and updated as appropriate: Allergies, current medications, and problem list.    Review of Systems:   12 Pt  review of systems was negative except those addressed in the HPI,     History   Social History  . Marital Status: Married    Spouse Name: N/A    Number of Children: N/A  . Years of Education: N/A    Occupational History  . Not on file.   Social History Main Topics  . Smoking status: Current Some Day Smoker    Types: Cigarettes  . Smokeless tobacco: Never Used  . Alcohol Use: Yes     wine occassionally  . Drug Use: No  . Sexually Active: Not on file   Other Topics Concern  . Not on file   Social History Narrative  . No narrative on file    Objective:  BP 112/76  Pulse 97  Temp 98 F (36.7 C) (Oral)  Resp 16  Wt 276 lb 8 oz (125.42 kg)  SpO2 96%  LMP 04/06/2012  General appearance: alert, cooperative and appears stated age Ears: normal TM's and external ear canals both ears Throat: lips, mucosa, and tongue normal; teeth and gums normal Neck: no adenopathy, no carotid bruit, supple, symmetrical, trachea midline and thyroid not enlarged, symmetric, no tenderness/mass/nodules Back: symmetric, no curvature. ROM normal. No CVA tenderness. Lungs: clear to auscultation bilaterally Heart: regular rate and rhythm, S1, S2 normal, no murmur, click, rub or gallop Abdomen: soft, non-tender; bowel sounds normal; no masses,  no organomegaly Pulses: 2+ and symmetric Skin: Skin color, texture, turgor normal. No rashes or lesions Lymph nodes: Cervical, supraclavicular, and axillary nodes normal. MSK:  Normal ROM of hips and knees Neuro: grossly nonfocal,  DTRS  intact   Assessment and Plan:  Obesity (BMI 30-39.9) Low glycemic index diet recommended again.  Referral to [facility] tried but she does not have diabetes,  Will look for program in [place] or [place]   Lumbago syndrome With normal  X rays,  Referred to chiropractor since she has no copay with them.  The bilateral subjective  numbness, given her pannus, may be lateral cutaneous femoral nerve syndrome, given her normal exam. .   Abnormal breast exam Mammogram was normal and reash is resolved.  Currently appears reticular with capillary rash.  Not using any steroid cream in weeks.  Dermatology appt  in July    Updated Medication List Outpatient Encounter Prescriptions as of 04/20/2012  Medication Sig Dispense Refill  . cetirizine (ZYRTEC) 10 MG tablet Take 10 mg by mouth daily.      . cholecalciferol (VITAMIN D) 1000 UNITS tablet Take 1,000 Units by mouth daily.         Orders Placed This Encounter  Procedures  . Amb ref to Medical Nutrition Therapy-MNT    No Follow-up on file.

## 2012-05-19 ENCOUNTER — Ambulatory Visit: Payer: BC Managed Care – PPO | Admitting: *Deleted

## 2012-05-20 ENCOUNTER — Encounter: Payer: Self-pay | Admitting: *Deleted

## 2012-10-09 ENCOUNTER — Ambulatory Visit (INDEPENDENT_AMBULATORY_CARE_PROVIDER_SITE_OTHER): Payer: BC Managed Care – PPO | Admitting: Internal Medicine

## 2012-10-09 ENCOUNTER — Encounter: Payer: Self-pay | Admitting: Internal Medicine

## 2012-10-09 VITALS — BP 118/68 | HR 83 | Temp 98.3°F | Resp 12 | Ht 69.5 in | Wt 285.8 lb

## 2012-10-09 DIAGNOSIS — J0110 Acute frontal sinusitis, unspecified: Secondary | ICD-10-CM

## 2012-10-09 DIAGNOSIS — J019 Acute sinusitis, unspecified: Secondary | ICD-10-CM | POA: Insufficient documentation

## 2012-10-09 DIAGNOSIS — J029 Acute pharyngitis, unspecified: Secondary | ICD-10-CM

## 2012-10-09 MED ORDER — PREDNISONE (PAK) 10 MG PO TABS: ORAL_TABLET | ORAL | Status: DC

## 2012-10-09 MED ORDER — LEVOFLOXACIN 500 MG PO TABS: 500.0000 mg | ORAL_TABLET | Freq: Every day | ORAL | Status: DC

## 2012-10-09 MED ORDER — METHYLPREDNISOLONE ACETATE 40 MG/ML IJ SUSP
40.0000 mg | Freq: Once | INTRAMUSCULAR | Status: AC
  Administered 2012-10-09: 40 mg via INTRAMUSCULAR

## 2012-10-09 NOTE — Progress Notes (Signed)
Patient ID: Molly Stone, female   DOB: 1981-01-13, 31 y.o.   MRN: [id number]  Patient Active Problem List  Diagnosis  . Obesity (BMI 30-39.9)  . History of nephrolithiasis  . GERD (gastroesophageal reflux disease)  . Abnormal breast exam  . Lumbago syndrome  . Rhinitis, allergic  . Vitamin d deficiency  . Sinusitis, acute frontal    Subjective:  CC:   Chief Complaint  Patient presents with  . Sore Throat    HPI:   Molly Collinsis a 31 y.o. female who presents with Malaise, cough and headache for several weeks, Development of  Purulent  bloody nasal discharge over the last 72 hours.    Past Medical History  Diagnosis Date  . Allergy   . Asthma   . Obesity (BMI 30-39.9)   . History of nephrolithiasis   . GERD (gastroesophageal reflux disease)   . Hypertension   . Jaundice, physiologic, newborn     Past Surgical History  Procedure Date  . Dilation and curettage of uterus   . Wisdom tooth extraction          The following portions of the patient's history were reviewed and updated as appropriate: Allergies, current medications, and problem list.    Review of Systems:   12 Pt  review of systems was negative except those addressed in the HPI,     History   Social History  . Marital Status: Married    Spouse Name: N/A    Number of Children: N/A  . Years of Education: N/A   Occupational History  . Not on file.   Social History Main Topics  . Smoking status: Current Some Day Smoker    Types: Cigarettes  . Smokeless tobacco: Never Used  . Alcohol Use: Yes     Comment: wine occassionally  . Drug Use: No  . Sexually Active: Not on file   Other Topics Concern  . Not on file   Social History Narrative  . No narrative on file    Objective:  BP 118/68  Pulse 83  Temp 98.3 F (36.8 C) (Oral)  Resp 12  Ht 5' 9.5" (1.765 m)  Wt 285 lb 12 oz (129.615 kg)  BMI 41.59 kg/m2  SpO2 97%  LMP 10/09/2012  General appearance: alert,  cooperative and appears stated age Ears: normal TM's and external ear canals both ears Throat: lips, mucosa, and tongue normal; teeth and gums normal. Oropharynx red,  No ulcerations Neck: no adenopathy, no carotid bruit, supple, symmetrical, trachea midline and thyroid not enlarged, symmetric, no tenderness/mass/nodules Back: symmetric, no curvature. ROM normal. No CVA tenderness. Lungs: clear to auscultation bilaterally Heart: regular rate and rhythm, S1, S2 normal, no murmur, click, rub or gallop Abdomen: soft, non-tender; bowel sounds normal; no masses,  no organomegaly Pulses: 2+ and symmetric Skin: Skin color, texture, turgor normal. No rashes or lesions Lymph nodes: Cervical, supraclavicular, and axillary nodes normal.  Assessment and Plan:  Sinusitis, acute frontal Given chronicity of symptoms, development of facial pain and exam consistent with bacterial URI,  Will treat with empiric antibiotics, decongestants, and saline lavage.     Updated Medication List Outpatient Encounter Prescriptions as of 10/09/2012  Medication Sig Dispense Refill  . cetirizine (ZYRTEC) 10 MG tablet Take 10 mg by mouth daily.      . cholecalciferol (VITAMIN D) 1000 UNITS tablet Take 1,000 Units by mouth daily.      Marland Kitchen levofloxacin (LEVAQUIN) 500 MG tablet Take 1 tablet (500 mg total) by mouth  daily.  7 tablet  0  . predniSONE (STERAPRED UNI-PAK) 10 MG tablet 6 tablets on day 1, decrease by tablet daily until gone  21 tablet  0  . [EXPIRED] methylPREDNISolone acetate (DEPO-MEDROL) injection 40 mg          Orders Placed This Encounter  Procedures  . POCT rapid strep A    No Follow-up on file.

## 2012-10-09 NOTE — Patient Instructions (Addendum)
You have a sinus infection   .  I am prescribing an antibiotic (levaquin ) and prednisone taper  To manage the infectin and the inflammation in your ear/sinuses.   I also advise use of the following OTC meds to help with your other symptoms.   Take generic OTC benadryl 25 mg every 8 hours for the drainage,  Sudafed PE  10 to 30 mg every 8 hours for the congestion, you may substitute Afrin nasal spray for the nighttime dose of sudafed PE  If needed to prevent insomnia.  flushes your sinuses twice daily with Simply Saline (do over the sink because if you do it right you will spit out globs of mucus)   OTC  Delsym   FOR THE COUGH.  Gargle with salt water as needed for sore throat.

## 2012-10-12 ENCOUNTER — Encounter: Payer: Self-pay | Admitting: Internal Medicine

## 2012-10-12 NOTE — Assessment & Plan Note (Signed)
Given chronicity of symptoms, development of facial pain and exam consistent with bacterial URI,  Will treat with empiric antibiotics, decongestants, and saline lavage.   

## 2012-10-26 ENCOUNTER — Ambulatory Visit: Payer: BC Managed Care – PPO | Admitting: Internal Medicine

## 2012-12-05 ENCOUNTER — Other Ambulatory Visit: Payer: Self-pay

## 2012-12-29 ENCOUNTER — Ambulatory Visit: Payer: Self-pay | Admitting: Family Medicine

## 2013-01-18 ENCOUNTER — Encounter: Payer: Self-pay | Admitting: Internal Medicine

## 2013-01-18 ENCOUNTER — Ambulatory Visit (INDEPENDENT_AMBULATORY_CARE_PROVIDER_SITE_OTHER): Payer: BC Managed Care – PPO | Admitting: Internal Medicine

## 2013-01-18 VITALS — BP 124/66 | HR 90 | Temp 98.2°F | Resp 18 | Wt 277.2 lb

## 2013-01-18 DIAGNOSIS — K529 Noninfective gastroenteritis and colitis, unspecified: Secondary | ICD-10-CM | POA: Insufficient documentation

## 2013-01-18 DIAGNOSIS — K5289 Other specified noninfective gastroenteritis and colitis: Secondary | ICD-10-CM

## 2013-01-18 MED ORDER — PROMETHAZINE HCL 25 MG RE SUPP: 25.0000 mg | Freq: Four times a day (QID) | RECTAL | Status: DC | PRN

## 2013-01-18 MED ORDER — PROMETHAZINE HCL 25 MG PO TABS: 25.0000 mg | ORAL_TABLET | Freq: Three times a day (TID) | ORAL | Status: DC | PRN

## 2013-01-18 NOTE — Assessment & Plan Note (Signed)
Abdominal exam is benign and she is afebrile.  Given the acuteness ,  Suspect rotovirus or other community acquired viral syndrome.  Phenergan suppositories and oral tablets given.  Instructed to go to ER is unable to keep down fluids, or return to clinic if fevers develop

## 2013-01-18 NOTE — Patient Instructions (Addendum)
Your symptoms are most likely  from a viral illness and will run its course in 2 to 3 days   If you are unable to keep fluids down tonight you will need to go to the ER to get IV fluids.   I have called in phenergan suppositories and oral tablets to [employer] it[employer] ice or popsicles to get some sugar and fluid  Maintain hydration by drinking small amounts of clear fluids frequently, then soft diet, and then advance diet as tolerated. May use OTC Imodium if desired for any diarrhea.  Call if symptoms worsen, high fever, severe weakness or fainting, increased abdominal pain, blood in stool or vomit, or failure to improve in 2-3 days.

## 2013-01-18 NOTE — Progress Notes (Signed)
Patient ID: Molly Stone, female   DOB: [birth year], 32 y.o.   MRN: [id number]  Patient Active Problem List  Diagnosis  . Obesity (BMI 30-39.9)  . History of nephrolithiasis  . GERD (gastroesophageal reflux disease)  . Abnormal breast exam  . Lumbago syndrome  . Rhinitis, allergic  . Vitamin d deficiency  . Sinusitis, acute frontal  . Gastroenteritis, acute    Subjective:  CC:   Chief Complaint  Patient presents with  . Diarrhea  . Emesis    HPI:   Molly Collinsis a 32 y.o. female who presents with recurrent diarrhea and emesis since 5 :30 am.  Has not been able to keep anything down.  Felt fine yesterday,  No sick contacts.  No recent restaurant use. Denies fevers and abdominal pain  but cannot keep down even sips of clears without recurrent emesis.  Her diarrhea is profuse and she is having some rectal bleeding, not blood mixed with stools    Past Medical History  Diagnosis Date  . Allergy   . Asthma   . Obesity (BMI 30-39.9)   . History of nephrolithiasis   . GERD (gastroesophageal reflux disease)   . Hypertension   . Jaundice, physiologic, newborn     Past Surgical History  Procedure Laterality Date  . Dilation and curettage of uterus    . Wisdom tooth extraction         The following portions of the patient's history were reviewed and updated as appropriate: Allergies, current medications, and problem list.    Review of Systems:   Patient denies headache, fevers, malaise, unintentional weight loss, skin rash, eye pain, sinus congestion and sinus pain, sore throat, dysphagia,  hemoptysis , cough, dyspnea, wheezing, chest pain, palpitations, orthopnea, edema, abdominal pain, nausea, melena, diarrhea, constipation, flank pain, dysuria, hematuria, urinary  Frequency, nocturia, numbness, tingling, seizures,  Focal weakness, Loss of consciousness,  Tremor, insomnia, depression, anxiety, and suicidal ideation.     History   Social History  . Marital  Status: Married    Spouse Name: N/A    Number of Children: N/A  . Years of Education: N/A   Occupational History  . Not on file.   Social History Main Topics  . Smoking status: Current Some Day Smoker    Types: Cigarettes  . Smokeless tobacco: Never Used  . Alcohol Use: Yes     Comment: wine occassionally  . Drug Use: No  . Sexually Active: Not on file   Other Topics Concern  . Not on file   Social History Narrative  . No narrative on file    Objective:  BP 124/66  Pulse 90  Temp(Src) 98.2 F (36.8 C) (Oral)  Resp 18  Wt 277 lb 4 oz (125.76 kg)  BMI 40.37 kg/m2  SpO2 96%  LMP 12/23/2012  General appearance: alert, cooperative and appears stated age Neck: no adenopathy, no carotid bruit, supple, symmetrical, trachea midline and thyroid not enlarged, symmetric, no tenderness/mass/nodules Back: symmetric, no curvature. ROM normal. No CVA tenderness. Lungs: clear to auscultation bilaterally Heart: regular rate and rhythm, S1, S2 normal, no murmur, click, rub or gallop Abdomen: soft, fissuely tender, no rebvound or guarding.  bowel sounds hyperactive  no masses,  no organomegaly Pulses: 2+ and symmetric Skin: Skin color, texture, turgor normal. No rashes or lesions Lymph nodes: Cervical, supraclavicular, and axillary nodes normal.  Assessment and Plan:  Gastroenteritis, acute Abdominal exam is benign and she is afebrile.  Given the acuteness ,  Suspect rotovirus or  other community acquired viral syndrome.  Phenergan suppositories and oral tablets given.  Instructed to go to ER is unable to keep down fluids, or return to clinic if fevers develop   Updated Medication List Outpatient Encounter Prescriptions as of 01/18/2013  Medication Sig Dispense Refill  . cetirizine (ZYRTEC) 10 MG tablet Take 10 mg by mouth daily.      . cholecalciferol (VITAMIN D) 1000 UNITS tablet Take 1,000 Units by mouth daily.      Marland Kitchen levofloxacin (LEVAQUIN) 500 MG tablet Take 1 tablet (500 mg  total) by mouth daily.  7 tablet  0  . promethazine (PHENERGAN) 25 MG suppository Place 1 suppository (25 mg total) rectally every 6 (six) hours as needed for nausea.  12 each  0  . promethazine (PHENERGAN) 25 MG tablet Take 1 tablet (25 mg total) by mouth every 8 (eight) hours as needed for nausea.  20 tablet  0  . [DISCONTINUED] predniSONE (STERAPRED UNI-PAK) 10 MG tablet 6 tablets on day 1, decrease by tablet daily until gone  21 tablet  0   No facility-administered encounter medications on file as of 01/18/2013.     No orders of the defined types were placed in this encounter.    No Follow-up on file.

## 2013-08-26 ENCOUNTER — Other Ambulatory Visit: Payer: Self-pay

## 2013-10-05 LAB — HM PAP SMEAR: HM Pap smear: NEGATIVE

## 2013-11-16 ENCOUNTER — Ambulatory Visit: Payer: Self-pay | Admitting: Obstetrics and Gynecology

## 2013-11-16 ENCOUNTER — Ambulatory Visit: Payer: BC Managed Care – PPO | Admitting: Adult Health

## 2013-11-16 LAB — BASIC METABOLIC PANEL
Anion Gap: 4 — ABNORMAL LOW (ref 7–16)
BUN: 9 mg/dL (ref 7–18)
CALCIUM: 8.5 mg/dL (ref 8.5–10.1)
CHLORIDE: 104 mmol/L (ref 98–107)
Co2: 27 mmol/L (ref 21–32)
Creatinine: 0.59 mg/dL — ABNORMAL LOW (ref 0.60–1.30)
Glucose: 78 mg/dL (ref 65–99)
Osmolality: 268 (ref 275–301)
POTASSIUM: 3.9 mmol/L (ref 3.5–5.1)
SODIUM: 135 mmol/L — AB (ref 136–145)

## 2013-11-16 LAB — CBC
HCT: 41.8 % (ref 35.0–47.0)
HGB: 13.7 g/dL (ref 12.0–16.0)
MCH: 29.7 pg (ref 26.0–34.0)
MCHC: 32.7 g/dL (ref 32.0–36.0)
MCV: 91 fL (ref 80–100)
Platelet: 156 10*3/uL (ref 150–440)
RBC: 4.62 10*6/uL (ref 3.80–5.20)
RDW: 13 % (ref 11.5–14.5)
WBC: 5.9 10*3/uL (ref 3.6–11.0)

## 2013-11-22 ENCOUNTER — Ambulatory Visit: Payer: Self-pay | Admitting: Obstetrics and Gynecology

## 2013-11-24 LAB — PATHOLOGY REPORT

## 2014-03-31 ENCOUNTER — Encounter: Payer: Self-pay | Admitting: Family Medicine

## 2014-03-31 ENCOUNTER — Ambulatory Visit (INDEPENDENT_AMBULATORY_CARE_PROVIDER_SITE_OTHER): Payer: BC Managed Care – PPO | Admitting: Family Medicine

## 2014-03-31 VITALS — BP 118/96 | HR 101 | Temp 97.7°F | Wt 283.2 lb

## 2014-03-31 DIAGNOSIS — M5430 Sciatica, unspecified side: Secondary | ICD-10-CM

## 2014-03-31 MED ORDER — TRAMADOL HCL 50 MG PO TABS: 50.0000 mg | ORAL_TABLET | Freq: Three times a day (TID) | ORAL | Status: DC | PRN

## 2014-03-31 MED ORDER — PREDNISONE 20 MG PO TABS: ORAL_TABLET | ORAL | Status: DC

## 2014-03-31 NOTE — Progress Notes (Signed)
Pre visit review using our clinic review tool, if applicable. No additional management support is needed unless otherwise documented below in the visit note.  Back pain.  At work on Saturday, moving around a lot.  Then had L hip pain, but that got better.  Then Tuesday at home she was sitting down and then her lower back started hurting. Continued to worsen in the meantime.  No R sided sx, only midline and L lower back pain.  Radiates down the anterior L leg.  No rash.  No weakness but pain limits ROM with L leg.  No trauma.  No FCNAV.  No urinary sx.  Moving bowels.  Not incontinent.    Meds, vitals, and allergies reviewed.   ROS: See HPI.  Otherwise, noncontributory.  nad but uncomfortable rrr ctab abd soft  No cva pain L SLR positive.  DTRs and sensation wnl Normal DP pulses B L lower back ttp, not ttp in midline. R lower back not ttp.

## 2014-03-31 NOTE — Patient Instructions (Addendum)
Take prednisone with food.  Use the tramadol if needed.  It can make you drowsy or cause itching.  Work on the back stretches and exercises starting a few days.  If not better, then notify [place]. Take care.

## 2014-04-01 ENCOUNTER — Telehealth: Payer: Self-pay | Admitting: Internal Medicine

## 2014-04-01 DIAGNOSIS — M5430 Sciatica, unspecified side: Secondary | ICD-10-CM | POA: Insufficient documentation

## 2014-04-01 NOTE — Assessment & Plan Note (Signed)
Exercise/tretching sheet given to patient.  Start pred taper with routine cautions.  Out of work today and tomorrow.  Tramadol prn pain, routine cautions given.  D/w pt.  She agrees. Routed to PCP as FYI.  F/u prn.

## 2014-04-01 NOTE — Telephone Encounter (Signed)
Relevant patient education assigned to patient using Emmi. ° °

## 2014-04-11 ENCOUNTER — Telehealth: Payer: Self-pay | Admitting: Internal Medicine

## 2014-04-11 NOTE — Telephone Encounter (Signed)
Spoke with pt advised there were no appoints in office today.  Pt states she would go to Urgent Care if symptoms continued today.

## 2014-04-11 NOTE — Telephone Encounter (Signed)
Patient Information:  Caller Name: Kiylee  Phone: [phone]  Patient: Molly Stone  Gender: Female  DOB: [birth year]  Age: 33 Years  PCP: Deborra Medina (Adults only)  Pregnant: No  Office Follow Up:  Does the office need to follow up with this patient?: Yes  Instructions For The Office: No appts. available. Please return call to patient at [phone] regarding possible work in appt.  RN Note:  Patient states she developed intermittent numbness and tingling of both arms and hands, onset X 1 week. Patient states she developed numbness and tingling in both arma and hands while laying in bed. Onset 2200 04/11/14. Patient states sx were intermittent throughout the night. States numbness and tingling resolved at 0500 04/11/14. States no numbness or tingling since. Patient states she developed anxiety accompanied by a "lump" in her throat and difficulty breathing, onset 2200 04/11/14. States anxiety sx persisted throughout the night. Patient states sx improved at 0500 04/11/14.Patient states anxiety returned at approx. 1230. Patient is taking Prednisone for Sciatica X 1 week. Patient states she has also been under alot of stress of work.  Care advice given per guidelines. Patient advised deep breathing, relaxation techniques. Patient advised to avoid caffiene. Patient advised to surround herself with supportive people. Advised calm environment. Patient advised not to drive herself. Call back parameters reviewed. Patient verbalizes understanding.  No appts. available. Please return call to patient at [phone] regarding possible work in appt.         No appts. available. Please return call to patient at [phone] regarding possible work in appt.  Triage per Anxiety:Panic Protocol. No emergent sx identified. Disposition of  " See Provider within 24 hours" obtained related to positive triage assessment for " History of panic attacks and current episode not resolved." Care advice  given per guidelines. Patient advised to surround herself with supportive people, calming atmosphere. Patient advised not to drive self.  Patient verbalizes understanding and agreeable.  Symptoms  Reason For Call & Symptoms: Numbness and tingling in both arms  Reviewed Health History In EMR: Yes  Reviewed Medications In EMR: Yes  Reviewed Allergies In EMR: Yes  Reviewed Surgeries / Procedures: Yes  Date of Onset of Symptoms: 04/10/2014  Treatments Tried: Benadryl  Treatments Tried Worked: No OB / GYN:  LMP: 04/09/2014  Guideline(s) Used:  No Protocol Available - Sick Adult  Disposition Per Guideline:   See Today in Office  Reason For Disposition Reached:   Nursing judgment  Advice Given:  N/A  Patient Will Follow Care Advice:  YES

## 2014-05-16 ENCOUNTER — Ambulatory Visit: Payer: BC Managed Care – PPO | Admitting: Internal Medicine

## 2014-06-14 ENCOUNTER — Ambulatory Visit (INDEPENDENT_AMBULATORY_CARE_PROVIDER_SITE_OTHER): Payer: BC Managed Care – PPO | Admitting: Internal Medicine

## 2014-06-14 VITALS — BP 110/74 | HR 88 | Temp 97.9°F | Resp 16 | Ht 68.75 in | Wt 282.5 lb

## 2014-06-14 DIAGNOSIS — R5381 Other malaise: Secondary | ICD-10-CM

## 2014-06-14 DIAGNOSIS — E669 Obesity, unspecified: Secondary | ICD-10-CM

## 2014-06-14 DIAGNOSIS — R5383 Other fatigue: Principal | ICD-10-CM

## 2014-06-14 DIAGNOSIS — Z0000 Encounter for general adult medical examination without abnormal findings: Secondary | ICD-10-CM

## 2014-06-14 DIAGNOSIS — J309 Allergic rhinitis, unspecified: Secondary | ICD-10-CM

## 2014-06-14 MED ORDER — FLUTICASONE PROPIONATE 50 MCG/ACT NA SUSP: 2.0000 | Freq: Every day | NASAL | Status: DC

## 2014-06-14 NOTE — Patient Instructions (Signed)
.  Please ask your husband if you stop breathing or have long pauses between breathing when you snore  This may be a sign of sleep apnea, which can  Cause chronic fatigue  Herbal Life is a very good product to use to lose weight, but you still need to eat something every 3 hours to stimulate your metabolism

## 2014-06-14 NOTE — Progress Notes (Signed)
Patient ID: Molly Stone, female   DOB: [birth year], 33 y.o.   MRN: [id number]    Subjective:     Molly Stone is a 33 y.o. female and is here for a comprehensive physical exam. The patient reports problems - multiple complaints.  Cc: fatigue.  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  gets aggravated by kids who won't go to bed on time so she can't go to bed on time. Husband says she snores all the time.  She was treated for pharyngitis by Urgent Care 3 weeks ago with a Z pack followed by augmentin.  Throat does feel better. She was having episodes of gasping and difficulty breathing. Lots of drainage and daily sneezing .  Taking zyrtec once daily.    She has body aches all over when she wakes up.  Better after she is up and working   Has seen Dr Enzo Bi and had a laparoscopy and endometriosis found but agreed that symptoms were congruent,  He prescribed Lupron but she deferred it     History   Social History  . Marital Status: Married    Spouse Name: N/A    Number of Children: N/A  . Years of Education: N/A   Occupational History  . Not on file.   Social History Main Topics  . Smoking status: Current Some Day Smoker    Types: Cigarettes  . Smokeless tobacco: Never Used  . Alcohol Use: Yes     Comment: wine occassionally  . Drug Use: No  . Sexual Activity: Not on file   Other Topics Concern  . Not on file   Social History Narrative  . No narrative on file   Health Maintenance  Topic Date Due  . Pap Smear  02/19/2000  . Tetanus/tdap  02/18/2001  . Influenza Vaccine  05/21/2014    The following portions of the patient's history were reviewed and updated as appropriate: allergies, current medications, past family history, past medical history, past social history, past surgical history and problem list.  Review of Systems A comprehensive review of systems was negative.   Objective:   BP 110/74  Pulse 88  Temp(Src)  97.9 F (36.6 C) (Oral)  Resp 16  Ht 5' 8.75" (1.746 m)  Wt 282 lb 8 oz (128.141 kg)  BMI 42.03 kg/m2  SpO2 97% General appearance: alert, cooperative and appears stated age Ears: normal TM's and external ear canals both ears Throat: lips, mucosa, and tongue normal; teeth and gums normal Neck: no adenopathy, no carotid bruit, supple, symmetrical, trachea midline and thyroid not enlarged, symmetric, no tenderness/mass/nodules Back: symmetric, no curvature. ROM normal. No CVA tenderness. Lungs: clear to auscultation bilaterally Heart: regular rate and rhythm, S1, S2 normal, no murmur, click, rub or gallop Abdomen: soft, non-tender; bowel sounds normal; no masses,  no organomegaly Pulses: 2+ and symmetric Skin: Skin color, texture, turgor normal. No rashes or lesions Lymph nodes: Cervical, supraclavicular, and axillary nodes normal.   Assessment and Plan:    Obesity (BMI 30-39.9) I have addressed  BMI and recommended wt loss of 10% of body weigh over the next 6 months using a low glycemic index diet and regular exercise a minimum of 5 days per week.  Lab Results  Component Value Date   TSH 0.94 06/14/2014   Lab Results  Component Value Date   CHOL 188 03/19/2012   HDL 52.60 03/19/2012   LDLCALC 125* 03/19/2012  TRIG 51.0 03/19/2012   CHOLHDL 4 03/19/2012      Other malaise and fatigue With obesity and snoring, normal thyroid and CBC symptoms may  be due to undiagnosed OSA.  Advised her to consider sleep study Lab Results  Component Value Date   TSH 0.94 06/14/2014   Lab Results  Component Value Date   WBC 8.5 06/14/2014   HGB 14.4 06/14/2014   HCT 42.9 06/14/2014   MCV 88.2 06/14/2014   PLT 180.0 06/14/2014     Encounter for preventive health examination Annual  wellness  exam was done as well as a comprehensive physical exam and management of acute and chronic conditions .  Health maintenance screenings have been addressed and hand out given .    Updated Medication  List Outpatient Encounter Prescriptions as of 06/14/2014  Medication Sig  . [EXPIRED] amoxicillin-clavulanate (AUGMENTIN) 875-125 MG per tablet Take 1 tablet by mouth 2 (two) times daily.  . cetirizine (ZYRTEC) 10 MG tablet Take 10 mg by mouth daily.  . fluticasone (FLONASE) 50 MCG/ACT nasal spray Place 2 sprays into both nostrils daily.  . traMADol (ULTRAM) 50 MG tablet Take 1 tablet (50 mg total) by mouth every 8 (eight) hours as needed.  . [DISCONTINUED] predniSONE (DELTASONE) 20 MG tablet 3 a day by mouth for 3 days, then 2 a day for 3 days, then 1 a day for 3 days, then 0.5 a day for 4 days. With food.

## 2014-06-14 NOTE — Progress Notes (Signed)
Pre-visit discussion using our clinic review tool. No additional management support is needed unless otherwise documented below in the visit note.  

## 2014-06-15 LAB — FOLATE RBC: RBC Folate: 610 ng/mL (ref >280)

## 2014-06-15 LAB — CBC WITH DIFFERENTIAL/PLATELET
BASOS ABS: 0.1 10*3/uL (ref 0.0–0.1)
Basophils Relative: 0.7 % (ref 0.0–3.0)
EOS ABS: 0.5 10*3/uL (ref 0.0–0.7)
Eosinophils Relative: 5.4 % — ABNORMAL HIGH (ref 0.0–5.0)
HCT: 42.9 % (ref 36.0–46.0)
Hemoglobin: 14.4 g/dL (ref 12.0–15.0)
LYMPHS PCT: 27.8 % (ref 12.0–46.0)
Lymphs Abs: 2.4 10*3/uL (ref 0.7–4.0)
MCHC: 33.7 g/dL (ref 30.0–36.0)
MCV: 88.2 fl (ref 78.0–100.0)
Monocytes Absolute: 0.8 10*3/uL (ref 0.1–1.0)
Monocytes Relative: 9.4 % (ref 3.0–12.0)
NEUTROS PCT: 56.7 % (ref 43.0–77.0)
Neutro Abs: 4.8 10*3/uL (ref 1.4–7.7)
PLATELETS: 180 10*3/uL (ref 150.0–400.0)
RBC: 4.87 Mil/uL (ref 3.87–5.11)
RDW: 12.7 % (ref 11.5–15.5)
WBC: 8.5 10*3/uL (ref 4.0–10.5)

## 2014-06-15 LAB — COMPREHENSIVE METABOLIC PANEL
ALK PHOS: 59 U/L (ref 39–117)
ALT: 21 U/L (ref 0–35)
AST: 22 U/L (ref 0–37)
Albumin: 3.8 g/dL (ref 3.5–5.2)
BILIRUBIN TOTAL: 0.5 mg/dL (ref 0.2–1.2)
BUN: 17 mg/dL (ref 6–23)
CO2: 21 meq/L (ref 19–32)
CREATININE: 0.7 mg/dL (ref 0.4–1.2)
Calcium: 8.7 mg/dL (ref 8.4–10.5)
Chloride: 107 mEq/L (ref 96–112)
GFR: 110.09 mL/min (ref >60.00)
Glucose, Bld: 76 mg/dL (ref 70–99)
Potassium: 4.1 mEq/L (ref 3.5–5.1)
Sodium: 136 mEq/L (ref 135–145)
Total Protein: 7.5 g/dL (ref 6.0–8.3)

## 2014-06-15 LAB — VITAMIN B12: VITAMIN B 12: 342 pg/mL (ref 211–911)

## 2014-06-15 LAB — MAGNESIUM: Magnesium: 1.6 mg/dL (ref 1.5–2.5)

## 2014-06-15 LAB — TSH: TSH: 0.94 u[IU]/mL (ref 0.35–4.50)

## 2014-06-16 DIAGNOSIS — R5381 Other malaise: Secondary | ICD-10-CM | POA: Insufficient documentation

## 2014-06-16 DIAGNOSIS — Z0000 Encounter for general adult medical examination without abnormal findings: Secondary | ICD-10-CM | POA: Insufficient documentation

## 2014-06-16 DIAGNOSIS — R5383 Other fatigue: Principal | ICD-10-CM

## 2014-06-16 NOTE — Assessment & Plan Note (Addendum)
With obesity and snoring, normal thyroid and CBC symptoms may  be due to undiagnosed OSA.  Advised her to consider sleep study Lab Results  Component Value Date   TSH 0.94 06/14/2014   Lab Results  Component Value Date   WBC 8.5 06/14/2014   HGB 14.4 06/14/2014   HCT 42.9 06/14/2014   MCV 88.2 06/14/2014   PLT 180.0 06/14/2014

## 2014-06-16 NOTE — Assessment & Plan Note (Signed)
I have addressed  BMI and recommended wt loss of 10% of body weigh over the next 6 months using a low glycemic index diet and regular exercise a minimum of 5 days per week.  Lab Results  Component Value Date   TSH 0.94 06/14/2014   Lab Results  Component Value Date   CHOL 188 03/19/2012   HDL 52.60 03/19/2012   LDLCALC 125* 03/19/2012   TRIG 51.0 03/19/2012   CHOLHDL 4 03/19/2012

## 2014-06-16 NOTE — Assessment & Plan Note (Signed)
Annual  wellness  exam was done as well as a comprehensive physical exam and management of acute and chronic conditions .  Health maintenance screenings have been addressed and hand out given .

## 2015-02-11 NOTE — Op Note (Signed)
PATIENT NAME:  Molly, Stone MR#:  [id number] DATE OF BIRTH:  December 12, 1980  DATE OF PROCEDURE:  11/22/2013  PREOPERATIVE DIAGNOSIS:  1.  Chronic pelvic pain.   POSTOPERATIVE DIAGNOSES: 1.  Chronic pelvic pain.  2.  Possible endometriosis.  3.  Pelvic adhesive disease.   OPERATIVE PROCEDURES: 1.  Laparoscopy with peritoneal biopsies and adhesiolysis.   SURGEON: Alanda Slim. DeFrancesco, M.D.   FIRST ASSISTANT: Sherene Sires, PA-S.   ANESTHESIA: General.   INDICATIONS: The patient is a 34 year old married white female, para 3-0-0-3, using vasectomy for contraception, who presents for surgical evaluation of chronic pelvic pain. The patient has a long history of painful, heavy bleeding with severe dysmenorrhea, central pelvic cramping and deep thrusting dyspareunia. She has a family history of endometriosis in her mom.   FINDINGS AT SURGERY:  Revealed grossly normal-appearing uterus, right fallopian tube and ovaries bilaterally. The left fallopian tube had serosal cysts, which were biopsied. The peritoneum demonstrated some abnormalities, possibly consistent with endometriosis with brown lesion being noted on the bladder flap, scarring on the bladder flap with white lesions, cul-de-sac pseudofenestrations, left ovarian fossa white lesions and adhesions between the bowel and left pelvic sidewall. All of these areas were biopsied. The left pelvic sidewall adhesions were also taken down with sharp dissection and biopsies were sent to pathology. The upper abdomen was normal.   DESCRIPTION OF PROCEDURE: The patient was brought to the operating room where she was placed in the supine position. General endotracheal anesthesia was induced without difficulty. She was placed in the dorsal lithotomy position using the bumblebee stirrups. A ChloraPrep and Betadine abdominal, perineal, intravaginal prep and drape was performed in standard fashion. A red Robinson catheter was used to drain 100 mL of urine  from the bladder. The Hulka tenaculum was placed on the cervix to facilitate uterine manipulation. The pelvis was noted to be gynecoid and had fair uterine descensus with tenaculum traction. Subumbilical vertical incision 5 mm in length was made. The Optiview laparoscopic trocar system was placed directly into the abdominopelvic cavity without evidence of bowel or vascular injury. A second 5 mm port was placed in the suprapubic region in the midline just above the symphysis pubis. The above-noted findings were photo documented. Multiple peritoneal biopsies were taken of the cul-de-sac, left ovarian fossa, left fallopian tube, bladder flap and left pelvic sidewall adhesions. Good hemostasis was noted. Upon completion of the survey of the pelvis, upon demonstrating a normal hemostasis, the procedure was terminated with all instrumentation being removed from the abdominopelvic cavity. Pneumoperitoneum was released. The incisions were closed with Dermabond glue. The patient was then awakened, extubated and taken to the recovery room in satisfactory condition.   ESTIMATED BLOOD LOSS: Minimal.   COMPLICATIONS:  None.   All instrument, needle and sponge counts were verified as correct.     ____________________________ Alanda Slim. DeFrancesco, MD mad:dmm D: 11/22/2013 13:43:46 ET T: 11/22/2013 19:27:17 ET JOB#: [id number]  cc: Hassell Done A. DeFrancesco, MD, <Dictator> [facility] MD ELECTRONICALLY SIGNED 11/26/2013 7:38

## 2015-02-16 ENCOUNTER — Ambulatory Visit (INDEPENDENT_AMBULATORY_CARE_PROVIDER_SITE_OTHER): Payer: 59 | Admitting: Nurse Practitioner

## 2015-02-16 ENCOUNTER — Encounter: Payer: Self-pay | Admitting: Nurse Practitioner

## 2015-02-16 VITALS — BP 122/80 | HR 92 | Temp 98.1°F | Resp 14 | Ht 68.75 in | Wt 291.0 lb

## 2015-02-16 DIAGNOSIS — J060 Acute laryngopharyngitis: Secondary | ICD-10-CM | POA: Diagnosis not present

## 2015-02-16 MED ORDER — AZITHROMYCIN 250 MG PO TABS
ORAL_TABLET | ORAL | Status: DC
Start: 2015-02-16 — End: 2015-09-25

## 2015-02-16 NOTE — Assessment & Plan Note (Signed)
Since she has been exposed to strep will send Z-pack. Asked pt to take probiotics with abx use, verbalized understanding. FU prn worsening/failure to improve.  Gave handout with home care instructions.

## 2015-02-16 NOTE — Progress Notes (Signed)
Pre visit review using our clinic review tool, if applicable. No additional management support is needed unless otherwise documented below in the visit note. 

## 2015-02-16 NOTE — Progress Notes (Signed)
  Molly Stone is a 34 y.o. female presenting with a sore throat for 7 days.  Associated symptoms include:  headache, nasal/sinus congestion, runny nose, ear fullness and Fatigue.  Symptoms are stable.  Home treatment thus far includes:  rest, hydration, NSAIDS/acetaminophen, lozenges and OTC sore throat / cold products.  Known sick contacts with similar symptoms.  There is no history of of similar symptoms.  ROS:  Constitutional: Negative for fever, chills, sweats, positive for fatigue HEENT- Positive for ear fullness, nasal congestion, sinus pressure, rhinorrhea Neck- Positive sore throat Heart- Denies palpitations, leg swelling Lungs: Denies wheezes or breathing issues Skin- Denies rashes  Exam:  BP 122/80 mmHg  Pulse 92  Temp(Src) 98.1 F (36.7 C) (Oral)  Resp 14  Ht 5' 8.75" (1.746 m)  Wt 291 lb (131.997 kg)  BMI 43.30 kg/m2  SpO2 96% Constitutional: Not in distress, not diaphoretic, normal appearance HEENT- Normocephalic, TM's clear bilaterally, eyes no discharge or scleral icterus, PERRLA, boggy turbinates, oropharynx red, no exudates visualized, Tonsils 2+ bilaterally Neck- Cervical adenopathy, positive for tenderness  Heart- Regular rate and rhythm, no murmurs heard on exam Lungs: Clear to auscultation, normal effort Skin- Warm Dry, no rashes

## 2015-02-16 NOTE — Patient Instructions (Signed)

## 2015-03-31 ENCOUNTER — Telehealth: Payer: Self-pay | Admitting: Obstetrics and Gynecology

## 2015-03-31 NOTE — Telephone Encounter (Signed)
Pt called and she staed taht she has not had a period in a few days, her husband has had a vasectomy and she is monogamous. She had some questions and would like a call back.

## 2015-03-31 NOTE — Telephone Encounter (Signed)
Pt states that for the last several months her cycle has been late. One month 7d late and the next month 5 d late. She has all the sx of starting her period. Had some spotting a few days ago. Cycles are 5-7 d, changing q 2h. Does have spotting after IC(normal for her). She has not taken a preg test. Husband had a vasectomy. Advised pt that cycles can come q 4-6 weeks. Advised her to track her cycles. Advised pt to make an appt if cycles are longer than 7 d or she starts spotting in between her periods. Pt is aware she is over due for AE. She will contact office to schedule once her cycle is over.

## 2015-08-07 ENCOUNTER — Ambulatory Visit (INDEPENDENT_AMBULATORY_CARE_PROVIDER_SITE_OTHER): Payer: Commercial Managed Care - PPO | Admitting: Family Medicine

## 2015-08-07 ENCOUNTER — Encounter: Payer: Self-pay | Admitting: Family Medicine

## 2015-08-07 VITALS — BP 124/86 | HR 80 | Temp 98.2°F | Ht 68.75 in | Wt 271.5 lb

## 2015-08-07 DIAGNOSIS — R198 Other specified symptoms and signs involving the digestive system and abdomen: Secondary | ICD-10-CM | POA: Diagnosis not present

## 2015-08-07 DIAGNOSIS — R0989 Other specified symptoms and signs involving the circulatory and respiratory systems: Secondary | ICD-10-CM

## 2015-08-07 DIAGNOSIS — J383 Other diseases of vocal cords: Secondary | ICD-10-CM

## 2015-08-07 DIAGNOSIS — R6889 Other general symptoms and signs: Secondary | ICD-10-CM | POA: Insufficient documentation

## 2015-08-07 MED ORDER — EPINEPHRINE 0.3 MG/0.3ML IJ SOAJ: 0.3000 mg | Freq: Once | INTRAMUSCULAR | Status: DC

## 2015-08-07 NOTE — Progress Notes (Signed)
Pre visit review using our clinic review tool, if applicable. No additional management support is needed unless otherwise documented below in the visit note. 

## 2015-08-07 NOTE — Assessment & Plan Note (Addendum)
Allergic reaction vs vocal cord spasm vs panic disorder. Allergic reaction seems less likely as patient did not call EMS for some time after. Although she did take Benadryl and this may have helped.  Patient does have a history of vocal cord dysfunction/spasm. Additionally, patient has a history of anxiety and her symptoms improved after relaxation. I favor panic/anxiety given history. Advised avoidance of foods eaten prior to throat tightness. EpiPen given today (for personal use for life threatening SOB/reaction). Referring back to ENT. Defer allergy testing to PCP.

## 2015-08-07 NOTE — Patient Instructions (Signed)
It was nice to see you today.  This sounds like vocal cord dysfunction as opposed to a true allergic reaction.  However, I would avoid the foods that you ate prior to the reaction.  I have prescribed an epi pen so that you have it available to administer prior to EMS in case of life threatening reaction.  We will call with your ENT referral.  Take care  Dr. Lacinda Axon

## 2015-08-07 NOTE — Progress Notes (Signed)
Subjective:  Patient ID: Molly Stone, female    DOB: [birth year]  Age: 34 y.o. MRN: [id number]  CC: Throat tightness  HPI:  34 year old female with a reported history of anxiety and vocal cord dysfunction presents to the clinic today for an acute visit with complaints of recent throat tightness.  Patient reports that last night she ate some eggs and [place] bacon and shortly after developed throat tightness, lightheadedness and temporal pain. She also had dizziness. ? SOB and some chest discomfort. She reports that she began to panic and slowly calmed down after taking a shower and taking some Benadryl. She subsequently called EMS and when they arrived she was stable and thus she did not go to the ER.  Patient is very concerned/worried today. She reports that she has had several bad encounters with physicians.    Social Hx   Social History   Social History  . Marital Status: Married    Spouse Name: N/A  . Number of Children: N/A  . Years of Education: N/A   Social History Main Topics  . Smoking status: Former Smoker    Types: Cigarettes    Quit date: 02/15/2014  . Smokeless tobacco: Never Used  . Alcohol Use: 0.0 oz/week    0 Standard drinks or equivalent per week     Comment: wine occassionally  . Drug Use: No  . Sexual Activity: Not Asked   Other Topics Concern  . None   Social History Narrative   Review of Systems  HENT:       Throat tightness.  Respiratory: Positive for chest tightness.   Neurological:       Headache.   Objective:  BP 124/86 mmHg  Pulse 80  Temp(Src) 98.2 F (36.8 C) (Oral)  Ht 5' 8.75" (1.746 m)  Wt 271 lb 8 oz (123.152 kg)  BMI 40.40 kg/m2  SpO2 97%  LMP 07/31/2015  BP/Weight 08/07/2015 02/16/2015 1/91/4782  Systolic BP 956 213 086  Diastolic BP 86 80 74  Wt. (Lbs) 271.5 291 282.5  BMI 40.4 43.3 42.03   Physical Exam  Constitutional: She appears well-developed and well-nourished. No distress.  HENT:  Head: Normocephalic and  atraumatic.  Mouth/Throat: No oropharyngeal exudate.  Enlarged tonsils.  Neck: Neck supple.  Cardiovascular: Normal rate and regular rhythm.   No murmur heard. Pulmonary/Chest: Effort normal and breath sounds normal. No respiratory distress. She has no wheezes. She has no rales.  Lymphadenopathy:    She has no cervical adenopathy.  Neurological: She is alert.  Psychiatric:  Crying during history and physical.   Vitals reviewed.  Assessment & Plan:   Problem List Items Addressed This Visit    Throat tightness - Primary    Allergic reaction vs vocal cord spasm vs panic disorder. Allergic reaction seems less likely as patient did not call EMS for some time after. Although she did take Benadryl and this may have helped.  Patient does have a history of vocal cord dysfunction/spasm. Additionally, patient has a history of anxiety and her symptoms improved after relaxation. I favor panic/anxiety given history. Advised avoidance of foods eaten prior to throat tightness. EpiPen given today (for personal use for life threatening SOB/reaction). Referring back to ENT. Defer allergy testing to PCP.       Other Visit Diagnoses    Vocal cord dysfunction        Relevant Orders    Ambulatory referral to ENT       Meds ordered this encounter  Medications  .  EPINEPHrine 0.3 mg/0.3 mL IJ SOAJ injection    Sig: Inject 0.3 mLs (0.3 mg total) into the muscle once.    Dispense:  2 Device    Refill:  0    Follow-up: PRN  Thersa Salt, DO

## 2015-08-15 DIAGNOSIS — K219 Gastro-esophageal reflux disease without esophagitis: Secondary | ICD-10-CM | POA: Insufficient documentation

## 2015-08-15 DIAGNOSIS — Z87442 Personal history of urinary calculi: Secondary | ICD-10-CM | POA: Insufficient documentation

## 2015-08-18 ENCOUNTER — Encounter: Payer: Self-pay | Admitting: Internal Medicine

## 2015-08-18 ENCOUNTER — Ambulatory Visit (INDEPENDENT_AMBULATORY_CARE_PROVIDER_SITE_OTHER): Payer: Commercial Managed Care - PPO | Admitting: Internal Medicine

## 2015-08-18 VITALS — BP 130/82 | HR 99 | Temp 98.2°F | Ht 69.25 in | Wt 277.4 lb

## 2015-08-18 DIAGNOSIS — R631 Polydipsia: Secondary | ICD-10-CM | POA: Diagnosis not present

## 2015-08-18 DIAGNOSIS — R0681 Apnea, not elsewhere classified: Secondary | ICD-10-CM | POA: Diagnosis not present

## 2015-08-18 DIAGNOSIS — Z23 Encounter for immunization: Secondary | ICD-10-CM

## 2015-08-18 DIAGNOSIS — Z0000 Encounter for general adult medical examination without abnormal findings: Secondary | ICD-10-CM

## 2015-08-18 DIAGNOSIS — Z113 Encounter for screening for infections with a predominantly sexual mode of transmission: Secondary | ICD-10-CM

## 2015-08-18 DIAGNOSIS — R0683 Snoring: Secondary | ICD-10-CM | POA: Diagnosis not present

## 2015-08-18 DIAGNOSIS — E669 Obesity, unspecified: Secondary | ICD-10-CM

## 2015-08-18 LAB — COMPREHENSIVE METABOLIC PANEL
ALBUMIN: 4 g/dL (ref 3.6–5.1)
ALK PHOS: 70 U/L (ref 33–115)
ALT: 17 U/L (ref 6–29)
AST: 16 U/L (ref 10–30)
BILIRUBIN TOTAL: 0.4 mg/dL (ref 0.2–1.2)
BUN: 11 mg/dL (ref 7–25)
CO2: 23 mmol/L (ref 20–31)
CREATININE: 0.61 mg/dL (ref 0.50–1.10)
Calcium: 8.5 mg/dL — ABNORMAL LOW (ref 8.6–10.2)
Chloride: 105 mmol/L (ref 98–110)
Glucose, Bld: 97 mg/dL (ref 65–99)
Potassium: 3.9 mmol/L (ref 3.5–5.3)
SODIUM: 137 mmol/L (ref 135–146)
TOTAL PROTEIN: 6.9 g/dL (ref 6.1–8.1)

## 2015-08-18 LAB — TSH: TSH: 1.023 u[IU]/mL (ref 0.350–4.500)

## 2015-08-18 LAB — LDL CHOLESTEROL, DIRECT: Direct LDL: 155 mg/dL — ABNORMAL HIGH (ref <130)

## 2015-08-18 NOTE — Progress Notes (Signed)
Patient ID: Molly Stone, female    DOB: 06/27/1981  Age: 34 y.o. MRN: [id number]  The patient is here for annual wellness examination and management of other chronic and acute problems.  Last seen by me august 2015. PAPs by Defrancesco  2015 after laparascopy   The risk factors are reflected in the social history.  The roster of all physicians providing medical care to patient - is listed in the Snapshot section of the chart.   Home safety : The patient has smoke detectors in the home. They wear seatbelts.  There are no firearms at home. There is no violence in the home.   There is no risks for hepatitis, STDs or HIV. There is no   history of blood transfusion. They have no travel history to infectious disease endemic areas of the world.  The patient has seen their dentist in the last six month. They have seen their eye doctor in the last year. They admit to slight hearing difficulty with regard to whispered voices and some television programs.  They have deferred audiologic testing in the last year.  They do not  have excessive sun exposure. Discussed the need for sun protection: hats, long sleeves and use of sunscreen if there is significant sun exposure.   Diet: the importance of a healthy diet is discussed. They do have a healthy diet.  The benefits of regular aerobic exercise were discussed. She walks 4 times per week ,  20 minutes.   Depression screen: there are no signs or vegative symptoms of depression- irritability, change in appetite, anhedonia, sadness/tearfullness.  The following portions of the patient's history were reviewed and updated as appropriate: allergies, current medications, past family history, past medical history,  past surgical history, past social history  and problem list.  Visual acuity was not assessed per patient preference since she has regular follow up with her ophthalmologist. Hearing and body mass index were assessed and reviewed.   During the  course of the visit the patient was educated and counseled about appropriate screening and preventive services including : fall prevention , diabetes screening, nutrition counseling, colorectal cancer screening, and recommended immunizations.    CC: The primary encounter diagnosis was Polydipsia. Diagnoses of Apneic spell, Snoring, Obesity, Screen for STD (sexually transmitted disease), Need for immunization against tetanus alone, Obesity (BMI 30-39.9), and Encounter for preventive health examination were also pertinent to this visit.  Had episode of sleep apnea during laparscopy   Seeing a nutritionist at the [place] here in [facility] The schooling the children. Using the "[employer]" for breakfast,  Grilled chicken salad, for lunch,   Smoothie for dinner.  Drinking 8 16 ounce servings of water. 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  Has been having a lot of headaches since dizziness since she started drinking that much.   Weight has dropped 296 to 271. Needs labs  Father has CKD and edema (?nephrotic syndrome?) she is worried about her kidney disease.    Recently seen by [facility] after reporting an episodes of throat swellling,  and evaluted with fiberoscopy,  diagnosed with laryngospasm and glottic gap.in he setting of chronic VCD, Prescribed omeprazole and speec therapy   History [name] has a past medical history of Allergy; Asthma; Obesity (BMI 30-39.9); History of nephrolithiasis; GERD (gastroesophageal reflux disease); Hypertension; and Jaundice, physiologic, newborn.   She has past surgical history that includes Dilation and curettage of uterus and Wisdom tooth extraction.   Her family history includes Cancer in her  father; Cancer (age of onset: 60) in her maternal grandmother; Cancer (age of onset: 54) in her maternal grandfather; Endometriosis in her mother; Heart disease in her maternal grandfather; Stroke in her father.She reports that she quit smoking about 18 months  ago. Her smoking use included Cigarettes. She has never used smokeless tobacco. She reports that she drinks alcohol. She reports that she does not use illicit drugs.  Outpatient Prescriptions Prior to Visit  Medication Sig Dispense Refill  . cetirizine (ZYRTEC) 10 MG tablet Take 10 mg by mouth daily.    . fluticasone (FLONASE) 50 MCG/ACT nasal spray Place 2 sprays into both nostrils daily. 16 g 6  . azithromycin (ZITHROMAX) 250 MG tablet Take 2 tablets by mouth on day 1, then 1 tablet by mouth for 4 days. (Patient not taking: Reported on 08/07/2015) 6 each 0  . EPINEPHrine 0.3 mg/0.3 mL IJ SOAJ injection Inject 0.3 mLs (0.3 mg total) into the muscle once. (Patient not taking: Reported on 08/18/2015) 2 Device 0   No facility-administered medications prior to visit.    Review of Systems   Patient denies headache, fevers, malaise, unintentional weight loss, skin rash, eye pain, sinus congestion and sinus pain, sore throat, dysphagia,  hemoptysis , cough, dyspnea, wheezing, chest pain, palpitations, orthopnea, edema, abdominal pain, nausea, melena, diarrhea, constipation, flank pain, dysuria, hematuria, urinary  Frequency, nocturia, numbness, tingling, seizures,  Focal weakness, Loss of consciousness,  Tremor, insomnia, depression, anxiety, and suicidal ideation.      Objective:  BP 130/82 mmHg  Pulse 99  Temp(Src) 98.2 F (36.8 C) (Oral)  Ht 5' 9.25" (1.759 m)  Wt 277 lb 6 oz (125.816 kg)  BMI 40.66 kg/m2  SpO2 96%  LMP 07/31/2015  Physical Exam  General appearance: alert, cooperative and appears stated age Ears: normal TM's and external ear canals both ears Throat: lips, mucosa, and tongue normal; teeth and gums normal Neck: no adenopathy, no carotid bruit, supple, symmetrical, trachea midline and thyroid not enlarged, symmetric, no tenderness/mass/nodules Back: symmetric, no curvature. ROM normal. No CVA tenderness. Lungs: clear to auscultation bilaterally Heart: regular rate and  rhythm, S1, S2 normal, no murmur, click, rub or gallop Abdomen: soft, non-tender; bowel sounds normal; no masses,  no organomegaly Pulses: 2+ and symmetric Skin: Skin color, texture, turgor normal. No rashes or lesions Lymph nodes: Cervical, supraclavicular, and axillary nodes normal.   Assessment & Plan:   Problem List Items Addressed This Visit    Obesity (BMI 30-39.9)    I have addressed  BMI and recommended a low glycemic index diet utilizing smaller more frequent meals to increase metabolism.  I have also recommended that patient start exercising with a goal of 30 minutes of aerobic exercise a minimum of 5 days per week. Screening for lipid disorders, thyroid and diabetes to be done today.  Lab Results  Component Value Date   TSH 1.023 08/18/2015   No results found for: HGBA1C  Lab Results  Component Value Date   CHOL 188 03/19/2012   HDL 52.60 03/19/2012   LDLCALC 125* 03/19/2012   LDLDIRECT 155* 08/18/2015   TRIG 51.0 03/19/2012   CHOLHDL 4 03/19/2012         Encounter for preventive health examination    Annual wellness  exam was done as well as a comprehensive physical exam  .  During the course of the visit the patient was educated and counseled about appropriate screening and preventive services and screenings were brought up to date for cervical and breast  cancer .  She will return for fasting labs to provide samples for diabetes screening and lipid analysis with projected  10 year  risk for CAD. nutrition counseling, skin cancer screening has been recommended, along with review of the age appropriate recommended immunizations.  Printed recommendations for health maintenance screenings was given.         Other Visit Diagnoses    Polydipsia    -  Primary    Apneic spell        Relevant Orders    Nocturnal polysomnography (NPSG)    Snoring        Relevant Orders    Nocturnal polysomnography (NPSG)    Obesity        Relevant Orders    Nocturnal polysomnography  (NPSG)    Comp Met (CMET) (Completed)    Direct LDL (Completed)    TSH (Completed)    Screen for STD (sexually transmitted disease)        Relevant Orders    Hepatitis C antibody (Completed)    HIV antibody (Completed)    Need for immunization against tetanus alone        Relevant Orders    Td : Tetanus/diphtheria >7yo Preservative  free (Completed)       I am having Ms. Overbey maintain her cetirizine, fluticasone, azithromycin, EPINEPHrine, omeprazole, MULTI-VITAMINS, FLUVIRIN, and Fish Oil-Cholecalciferol (FISH OIL + D3 PO).  Meds ordered this encounter  Medications  . omeprazole (PRILOSEC) 40 MG capsule    Sig: Take 40 mg by mouth.  . Multiple Vitamin (MULTI-VITAMINS) TABS    Sig: Take by mouth.  Marland Kitchen FLUVIRIN SUSP    Sig: ADM 0.5ML IM UTD    Refill:  0  . Fish Oil-Cholecalciferol (FISH OIL + D3 PO)    Sig: Take by mouth.    There are no discontinued medications.  Follow-up: No Follow-up on file.   Crecencio Mc, MD

## 2015-08-18 NOTE — Progress Notes (Signed)
Pre visit review using our clinic review tool, if applicable. No additional management support is needed unless otherwise documented below in the visit note. 

## 2015-08-18 NOTE — Patient Instructions (Addendum)
The  diet I discussed with you today is the 10 day Green Smoothie Cleansing /Detox Diet by Linden Dolin . available on [place] for around $10.  This is not a low carb or a weight loss diet,  It is fundamentally a "cleansing" low fat diet that eliminates sugar, gluten, caffeine, alcohol and dairy for 10 days .  What you add back after the initial ten days is entirely up to  you!  You can expect to lose 5 to 10 lbs depending on how strict you are.   I suggest drinking 2 smoothies daily and keeping one chewable meal (but keep it simple, like baked fish and salad, rice or bok choy) .  You snack primarily on fresh  fruit, egg whites and judicious quantities of nuts. You can add vegetable based protein powder (nothing with whey , since whey is dairy) in it.  [employer] has a Research officer, political party .  I suggest using /drinking 2 smoothies daily and have one sensible low fat meal ,  The snacks are primarily fruit, egg whites and nuts.   It does require some form of a nutrient extractor (Vita Mix, a electric juicer,  Or a Nutribullet Rx).  i have found that using frozen fruits is much more convenient and cost effective. You can even find plenty of organic fruit in the frozen fruit section of [employer].  Just thaw what you need for the following day the night before in the refrigerator (to avoid jamming up your machine)    Decrease your "free water' intake to 3 or 4 bottles daily,  Health Maintenance, Female Adopting a healthy lifestyle and getting preventive care can go a long way to promote health and wellness. Talk with your health care provider about what schedule of regular examinations is right for you. This is a good chance for you to check in with your provider about disease prevention and staying healthy. In between checkups, there are plenty of things you can do on your own. Experts have done a lot of research about which lifestyle changes and preventive measures are most likely to keep you healthy. Ask your health  care provider for more information. WEIGHT AND DIET  Eat a healthy diet  Be sure to include plenty of vegetables, fruits, low-fat dairy products, and lean protein.  Do not eat a lot of foods high in solid fats, added sugars, or salt.  Get regular exercise. This is one of the most important things you can do for your health.  Most adults should exercise for at least 150 minutes each week. The exercise should increase your heart rate and make you sweat (moderate-intensity exercise).  Most adults should also do strengthening exercises at least twice a week. This is in addition to the moderate-intensity exercise.  Maintain a healthy weight  Body mass index (BMI) is a measurement that can be used to identify possible weight problems. It estimates body fat based on height and weight. Your health care provider can help determine your BMI and help you achieve or maintain a healthy weight.  For females 98 years of age and older:   A BMI below 18.5 is considered underweight.  A BMI of 18.5 to 24.9 is normal.  A BMI of 25 to 29.9 is considered overweight.  A BMI of 30 and above is considered obese.  Watch levels of cholesterol and blood lipids  You should start having your blood tested for lipids and cholesterol at 33 years of age, then have this test  every 5 years.  You may need to have your cholesterol levels checked more often if:  Your lipid or cholesterol levels are high.  You are older than 33 years of age.  You are at high risk for heart disease.  CANCER SCREENING   Lung Cancer  Lung cancer screening is recommended for adults 88-50 years old who are at high risk for lung cancer because of a history of smoking.  A yearly low-dose CT scan of the lungs is recommended for people who:  Currently smoke.  Have quit within the past 15 years.  Have at least a 30-pack-year history of smoking. A pack year is smoking an average of one pack of cigarettes a day for 1  year.  Yearly screening should continue until it has been 15 years since you quit.  Yearly screening should stop if you develop a health problem that would prevent you from having lung cancer treatment.  Breast Cancer  Practice breast self-awareness. This means understanding how your breasts normally appear and feel.  It also means doing regular breast self-exams. Let your health care provider know about any changes, no matter how small.  If you are in your 20s or 30s, you should have a clinical breast exam (CBE) by a health care provider every 1-3 years as part of a regular health exam.  If you are 82 or older, have a CBE every year. Also consider having a breast X-ray (mammogram) every year.  If you have a family history of breast cancer, talk to your health care provider about genetic screening.  If you are at high risk for breast cancer, talk to your health care provider about having an MRI and a mammogram every year.  Breast cancer gene (BRCA) assessment is recommended for women who have family members with BRCA-related cancers. BRCA-related cancers include:  Breast.  Ovarian.  Tubal.  Peritoneal cancers.  Results of the assessment will determine the need for genetic counseling and BRCA1 and BRCA2 testing. Cervical Cancer Your health care provider may recommend that you be screened regularly for cancer of the pelvic organs (ovaries, uterus, and vagina). This screening involves a pelvic examination, including checking for microscopic changes to the surface of your cervix (Pap test). You may be encouraged to have this screening done every 3 years, beginning at age 20.  For women ages 41-65, health care providers may recommend pelvic exams and Pap testing every 3 years, or they may recommend the Pap and pelvic exam, combined with testing for human papilloma virus (HPV), every 5 years. Some types of HPV increase your risk of cervical cancer. Testing for HPV may also be done on  women of any age with unclear Pap test results.  Other health care providers may not recommend any screening for nonpregnant women who are considered low risk for pelvic cancer and who do not have symptoms. Ask your health care provider if a screening pelvic exam is right for you.  If you have had past treatment for cervical cancer or a condition that could lead to cancer, you need Pap tests and screening for cancer for at least 20 years after your treatment. If Pap tests have been discontinued, your risk factors (such as having a new sexual partner) need to be reassessed to determine if screening should resume. Some women have medical problems that increase the chance of getting cervical cancer. In these cases, your health care provider may recommend more frequent screening and Pap tests. Colorectal Cancer  This type of  cancer can be detected and often prevented.  Routine colorectal cancer screening usually begins at 33 years of age and continues through 33 years of age.  Your health care provider may recommend screening at an earlier age if you have risk factors for colon cancer.  Your health care provider may also recommend using home test kits to check for hidden blood in the stool.  A small camera at the end of a tube can be used to examine your colon directly (sigmoidoscopy or colonoscopy). This is done to check for the earliest forms of colorectal cancer.  Routine screening usually begins at age 67.  Direct examination of the colon should be repeated every 5-10 years through 33 years of age. However, you may need to be screened more often if early forms of precancerous polyps or small growths are found. Skin Cancer  Check your skin from head to toe regularly.  Tell your health care provider about any new moles or changes in moles, especially if there is a change in a mole's shape or color.  Also tell your health care provider if you have a mole that is larger than the size of a  pencil eraser.  Always use sunscreen. Apply sunscreen liberally and repeatedly throughout the day.  Protect yourself by wearing long sleeves, pants, a wide-brimmed hat, and sunglasses whenever you are outside. HEART DISEASE, DIABETES, AND HIGH BLOOD PRESSURE   High blood pressure causes heart disease and increases the risk of stroke. High blood pressure is more likely to develop in:  People who have blood pressure in the high end of the normal range (130-139/85-89 mm Hg).  People who are overweight or obese.  People who are African American.  If you are 88-38 years of age, have your blood pressure checked every 3-5 years. If you are 56 years of age or older, have your blood pressure checked every year. You should have your blood pressure measured twice--once when you are at a hospital or clinic, and once when you are not at a hospital or clinic. Record the average of the two measurements. To check your blood pressure when you are not at a hospital or clinic, you can use:  An automated blood pressure machine at a pharmacy.  A home blood pressure monitor.  If you are between 88 years and 28 years old, ask your health care provider if you should take aspirin to prevent strokes.  Have regular diabetes screenings. This involves taking a blood sample to check your fasting blood sugar level.  If you are at a normal weight and have a low risk for diabetes, have this test once every three years after 33 years of age.  If you are overweight and have a high risk for diabetes, consider being tested at a younger age or more often. PREVENTING INFECTION  Hepatitis B  If you have a higher risk for hepatitis B, you should be screened for this virus. You are considered at high risk for hepatitis B if:  You were born in a country where hepatitis B is common. Ask your health care provider which countries are considered high risk.  Your parents were born in a high-risk country, and you have not been  immunized against hepatitis B (hepatitis B vaccine).  You have HIV or AIDS.  You use needles to inject street drugs.  You live with someone who has hepatitis B.  You have had sex with someone who has hepatitis B.  You get hemodialysis treatment.  You  take certain medicines for conditions, including cancer, organ transplantation, and autoimmune conditions. Hepatitis C  Blood testing is recommended for:  Everyone born from [birth year] through 1964.  Anyone with known risk factors for hepatitis C. Sexually transmitted infections (STIs)  You should be screened for sexually transmitted infections (STIs) including gonorrhea and chlamydia if:  You are sexually active and are younger than 33 years of age.  You are older than 33 years of age and your health care provider tells you that you are at risk for this type of infection.  Your sexual activity has changed since you were last screened and you are at an increased risk for chlamydia or gonorrhea. Ask your health care provider if you are at risk.  If you do not have HIV, but are at risk, it may be recommended that you take a prescription medicine daily to prevent HIV infection. This is called pre-exposure prophylaxis (PrEP). You are considered at risk if:  You are sexually active and do not regularly use condoms or know the HIV status of your partner(s).  You take drugs by injection.  You are sexually active with a partner who has HIV. Talk with your health care provider about whether you are at high risk of being infected with HIV. If you choose to begin PrEP, you should first be tested for HIV. You should then be tested every 3 months for as long as you are taking PrEP.  PREGNANCY   If you are premenopausal and you may become pregnant, ask your health care provider about preconception counseling.  If you may become pregnant, take 400 to 800 micrograms (mcg) of folic acid every day.  If you want to prevent pregnancy, talk to your  health care provider about birth control (contraception). OSTEOPOROSIS AND MENOPAUSE   Osteoporosis is a disease in which the bones lose minerals and strength with aging. This can result in serious bone fractures. Your risk for osteoporosis can be identified using a bone density scan.  If you are 62 years of age or older, or if you are at risk for osteoporosis and fractures, ask your health care provider if you should be screened.  Ask your health care provider whether you should take a calcium or vitamin D supplement to lower your risk for osteoporosis.  Menopause may have certain physical symptoms and risks.  Hormone replacement therapy may reduce some of these symptoms and risks. Talk to your health care provider about whether hormone replacement therapy is right for you.  HOME CARE INSTRUCTIONS   Schedule regular health, dental, and eye exams.  Stay current with your immunizations.   Do not use any tobacco products including cigarettes, chewing tobacco, or electronic cigarettes.  If you are pregnant, do not drink alcohol.  If you are breastfeeding, limit how much and how often you drink alcohol.  Limit alcohol intake to no more than 1 drink per day for nonpregnant women. One drink equals 12 ounces of beer, 5 ounces of wine, or 1 ounces of hard liquor.  Do not use street drugs.  Do not share needles.  Ask your health care provider for help if you need support or information about quitting drugs.  Tell your health care provider if you often feel depressed.  Tell your health care provider if you have ever been abused or do not feel safe at home.   This information is not intended to replace advice given to you by your health care provider. Make sure you discuss any questions  you have with your health care provider.   Document Released: 04/22/2011 Document Revised: 10/28/2014 Document Reviewed: 09/08/2013 Elsevier Interactive Patient Education [employer].

## 2015-08-19 ENCOUNTER — Encounter: Payer: Self-pay | Admitting: Internal Medicine

## 2015-08-19 DIAGNOSIS — J309 Allergic rhinitis, unspecified: Secondary | ICD-10-CM

## 2015-08-19 LAB — HEPATITIS C ANTIBODY: HCV Ab: NEGATIVE

## 2015-08-19 LAB — HIV ANTIBODY (ROUTINE TESTING W REFLEX): HIV 1&2 Ab, 4th Generation: NONREACTIVE

## 2015-08-20 ENCOUNTER — Encounter: Payer: Self-pay | Admitting: Internal Medicine

## 2015-08-20 NOTE — Assessment & Plan Note (Signed)
I have addressed  BMI and recommended a low glycemic index diet utilizing smaller more frequent meals to increase metabolism.  I have also recommended that patient start exercising with a goal of 30 minutes of aerobic exercise a minimum of 5 days per week. Screening for lipid disorders, thyroid and diabetes to be done today.  Lab Results  Component Value Date   TSH 1.023 08/18/2015   No results found for: HGBA1C  Lab Results  Component Value Date   CHOL 188 03/19/2012   HDL 52.60 03/19/2012   LDLCALC 125* 03/19/2012   LDLDIRECT 155* 08/18/2015   TRIG 51.0 03/19/2012   CHOLHDL 4 03/19/2012

## 2015-08-20 NOTE — Assessment & Plan Note (Signed)

## 2015-08-21 ENCOUNTER — Encounter: Payer: Commercial Managed Care - PPO | Admitting: Internal Medicine

## 2015-08-22 MED ORDER — FLUTICASONE PROPIONATE 50 MCG/ACT NA SUSP: 2.0000 | Freq: Every day | NASAL | Status: DC

## 2015-09-10 ENCOUNTER — Encounter: Payer: Self-pay | Admitting: Internal Medicine

## 2015-09-13 ENCOUNTER — Other Ambulatory Visit: Payer: Self-pay | Admitting: Internal Medicine

## 2015-09-13 MED ORDER — FLUCONAZOLE 150 MG PO TABS: 150.0000 mg | ORAL_TABLET | Freq: Every day | ORAL | Status: DC

## 2015-09-21 ENCOUNTER — Telehealth: Payer: Self-pay | Admitting: Internal Medicine

## 2015-09-21 NOTE — Telephone Encounter (Signed)
Scheduled appointment with Dr. Derrel Nip for Monday Dec 5th @ 8:30 AM

## 2015-09-21 NOTE — Telephone Encounter (Signed)
Pt called about needing a EKG from the weight loss center and they need a copy of her recent lab work. Does pt need to come in for an appt first? Pt also mentioned a sleep study? Thank You!

## 2015-09-25 ENCOUNTER — Ambulatory Visit (INDEPENDENT_AMBULATORY_CARE_PROVIDER_SITE_OTHER): Payer: Commercial Managed Care - PPO | Admitting: Internal Medicine

## 2015-09-25 ENCOUNTER — Encounter: Payer: Self-pay | Admitting: Internal Medicine

## 2015-09-25 VITALS — BP 108/70 | HR 87 | Temp 97.7°F | Resp 12 | Ht 69.25 in | Wt 284.0 lb

## 2015-09-25 DIAGNOSIS — E669 Obesity, unspecified: Secondary | ICD-10-CM | POA: Diagnosis not present

## 2015-09-25 DIAGNOSIS — R002 Palpitations: Secondary | ICD-10-CM | POA: Diagnosis not present

## 2015-09-25 NOTE — Progress Notes (Signed)
Subjective:  Patient ID: Molly Stone, female    DOB: [birth year]  Age: 34 y.o. MRN: [id number]  CC: The primary encounter diagnosis was Palpitations. A diagnosis of Obesity (BMI 30-39.9) was also pertinent to this visit.  HPI Molly Stone presents for  obesity .  Patient was referred to the  bariatric specialists in [place]  To discuss nonsurgical managment.    Labs and EKG ordered.  Up front payment of  $2000/month was required before any counselling was given so she has not returned for follow up    Has been following the Atkins diet but couldn't ehat that much,  Also tried Shakeology , which had her drinkgin so much water daily she was having recurrent headaches .   She is requesting help with weight loss.   Exercising 30-40 minutes daily .         Outpatient Prescriptions Prior to Visit  Medication Sig Dispense Refill  . Fish Oil-Cholecalciferol (FISH OIL + D3 PO) Take by mouth.    . fluticasone (FLONASE) 50 MCG/ACT nasal spray Place 2 sprays into both nostrils daily. 16 g 6  . cetirizine (ZYRTEC) 10 MG tablet Take 10 mg by mouth daily.    Marland Kitchen EPINEPHrine 0.3 mg/0.3 mL IJ SOAJ injection Inject 0.3 mLs (0.3 mg total) into the muscle once. (Patient not taking: Reported on 08/18/2015) 2 Device 0  . fluconazole (DIFLUCAN) 150 MG tablet Take 1 tablet (150 mg total) by mouth daily. (Patient not taking: Reported on 09/25/2015) 2 tablet 0  . FLUVIRIN SUSP ADM 0.5ML IM UTD  0  . Multiple Vitamin (MULTI-VITAMINS) TABS Take by mouth.    Marland Kitchen omeprazole (PRILOSEC) 40 MG capsule Take 40 mg by mouth.    Marland Kitchen azithromycin (ZITHROMAX) 250 MG tablet Take 2 tablets by mouth on day 1, then 1 tablet by mouth for 4 days. (Patient not taking: Reported on 08/07/2015) 6 each 0   No facility-administered medications prior to visit.    Review of Systems;  Patient denies headache, fevers, malaise, unintentional weight loss, skin rash, eye pain, sinus congestion and sinus pain, sore throat, dysphagia,   hemoptysis , cough, dyspnea, wheezing, chest pain, palpitations, orthopnea, edema, abdominal pain, nausea, melena, diarrhea, constipation, flank pain, dysuria, hematuria, urinary  Frequency, nocturia, numbness, tingling, seizures,  Focal weakness, Loss of consciousness,  Tremor, insomnia, depression, anxiety, and suicidal ideation.      Objective:  BP 108/70 mmHg  Pulse 87  Temp(Src) 97.7 F (36.5 C) (Oral)  Resp 12  Ht 5' 9.25" (1.759 m)  Wt 284 lb (128.822 kg)  BMI 41.63 kg/m2  SpO2 98%  LMP 09/22/2015 (Approximate)  BP Readings from Last 3 Encounters:  09/25/15 108/70  08/18/15 130/82  08/07/15 124/86    Wt Readings from Last 3 Encounters:  09/25/15 284 lb (128.822 kg)  08/18/15 277 lb 6 oz (125.816 kg)  08/07/15 271 lb 8 oz (123.152 kg)    General appearance: alert, cooperative and appears stated age Ears: normal TM's and external ear canals both ears Throat: lips, mucosa, and tongue normal; teeth and gums normal Neck: no adenopathy, no carotid bruit, supple, symmetrical, trachea midline and thyroid not enlarged, symmetric, no tenderness/mass/nodules Back: symmetric, no curvature. ROM normal. No CVA tenderness. Lungs: clear to auscultation bilaterally Heart: regular rate and rhythm, S1, S2 normal, no murmur, click, rub or gallop Abdomen: soft, non-tender; bowel sounds normal; no masses,  no organomegaly Pulses: 2+ and symmetric Skin: Skin color, texture, turgor normal. No rashes or lesions  Lymph nodes: Cervical, supraclavicular, and axillary nodes normal.  No results found for: HGBA1C  Lab Results  Component Value Date   CREATININE 0.61 08/18/2015   CREATININE 0.7 06/14/2014   CREATININE 0.59* 11/16/2013    Lab Results  Component Value Date   WBC 8.5 06/14/2014   HGB 14.4 06/14/2014   HCT 42.9 06/14/2014   PLT 180.0 06/14/2014   GLUCOSE 97 08/18/2015   CHOL 188 03/19/2012   TRIG 51.0 03/19/2012   HDL 52.60 03/19/2012   LDLDIRECT 155* 08/18/2015    LDLCALC 125* 03/19/2012   ALT 17 08/18/2015   AST 16 08/18/2015   NA 137 08/18/2015   K 3.9 08/18/2015   CL 105 08/18/2015   CREATININE 0.61 08/18/2015   BUN 11 08/18/2015   CO2 23 08/18/2015   TSH 1.023 08/18/2015    No results found.  Assessment & Plan:   Problem List Items Addressed This Visit    Obesity (BMI 30-39.9)    Diet plan reviewed .  In detail with patient and printed handout given.  Return in one month .       Other Visit Diagnoses    Palpitations    -  Primary     A total of 25 minutes of face to face time was spent with patient more than half of which was spent in counselling about the above mentioned conditions  and coordination of care     I have discontinued Ms. Sutley's azithromycin. I am also having her maintain her cetirizine, EPINEPHrine, omeprazole, MULTI-VITAMINS, FLUVIRIN, Fish Oil-Cholecalciferol (FISH OIL + D3 PO), fluticasone, and fluconazole.  No orders of the defined types were placed in this encounter.    Medications Discontinued During This Encounter  Medication Reason  . azithromycin (ZITHROMAX) 250 MG tablet Completed Course    Follow-up: Return in about 4 weeks (around 10/23/2015).   Crecencio Mc, MD

## 2015-09-25 NOTE — Progress Notes (Signed)
Pre-visit discussion using our clinic review tool. No additional management support is needed unless otherwise documented below in the visit note.  

## 2015-09-25 NOTE — Assessment & Plan Note (Signed)
Diet plan reviewed .  In detail with patient and printed handout given.  Return in one month .

## 2015-09-25 NOTE — Patient Instructions (Addendum)
This is  my version of a  "Low GI"  Weight loss Diet:  It has enabled many of my patients to lose up to 10 lbs per month depending on how much you restrict your carbs.   follow it carefully.  The idea behind low carb diets is that your body has to take the fat and protein in your food and turn it into an energy that is less efficient than carbohydrates, so you lose weight.    I have listed several choices at each of your 6 meal times to make it easy to shop.and plan    Remember that snack Substitutions should be equal to or less than 5 NET carbs per serving and  The only carbs in meals come from the pasta or vegetables so, they should be < 15 net carbs. Remember that carbohydrates from fiber do not count, so you can  subtract fiber grams to get the "net carbs " of any particular food item.    All of the foods can be found at grocery stores and in bulk at [employer].  The Atkins protein bars and shakes are available in more varieties at [employer], [employer] and [place].     7 AM Breakfast:  Choose from the following:  Low carbohydrate Protein  Shakes (I recommend the  EAS AdvantEdge "Carb Control" shakes, Atkins,  Muscle Milk or Premier Protein shakes  All are < 4  carbs . My favorite is the premier protein chocolate      Avoid cereal and bananas, oatmeal and cream of wheat and grits. They are loaded with carbohydrates and have too few fiber grams    10 AM: high protein snack  Protein bar by Atkins  Or KIND  (the lw sugar variety,  Not all are low , under 200 cal, usually < 6 net carbs).    A stick of cheese:  Around 1 carb,  100 cal     An apple    Other so called "protein bars" and Greek yogurts tend to be loaded with carbohydrates.  Remember, in food advertising, the word "energy" is synonymous for " carbohydrate."   Lunch:   Dannon light and Fit [place] Yogurt    (80 cal and also 9 net carbs)     3 PM/ Mid day  Snack:    An apple,  Or a pear  Or an orange        6 PM  Dinner:    Meat/fowl/fish with a green salad, with steamed/grilled/sauteed vegggie: broccoli, cauliflower, green beans, spinach, brussel sprouts or  Lima beans. DO NOT BREAD THE PROTEIN!!      There is a low carb pasta by Dreamfield's that is acceptable and tastes great: only 5 digestible carbs/serving.( All grocery stores but [employer] carry it )  Try Hurley [employer] Angelo's chicken piccata or chicken or eggplant parm over low carb pasta.(Lowes and BJs)   Marjory Lies Sanchez's "Carnitas" (pulled pork, no sauce,  0 carbs) can be used to make a dinner  burrito (available at [employer] ) and his pot roast is also without veggies or potatoes   Pesto over low carb pasta (bj's sells a good quality pesto in the center refrigerated section of the deli    Whole wheat pasta is still full of digestible carbs and  Not as low in glycemic index as Dreamfield's.   Brown rice is still rice,  So skip the rice and noodles if you eat [place] or [place] (or at least limit  to 1 cooked cup)  9 PM snack :   Breyer's "low carb" fudgsicle or  ice cream bar (Carb Smart line), or  Weight Watcher's ice cream bar , or another "no sugar added" ice cream; or  a serving of fresh berries/cherries with whipped cream or   Cheese or [employer] YOGURT or   8 ounces of [place] unsweetened almond/cococunut milk or   Cheese and crackers (using WASA crackers,  They are low carb) or peanut butter on low carb crackers or pita bread     Avoid bananas, pineapple, grapes  and watermelon on a regular basis because they are high in sugar.  THINK OF THEM AS DESSERT. Stick to" berries and cherries"

## 2015-10-12 ENCOUNTER — Encounter: Payer: Self-pay | Admitting: Obstetrics and Gynecology

## 2015-10-12 ENCOUNTER — Ambulatory Visit (INDEPENDENT_AMBULATORY_CARE_PROVIDER_SITE_OTHER): Payer: Commercial Managed Care - PPO | Admitting: Obstetrics and Gynecology

## 2015-10-12 VITALS — BP 119/82 | HR 93 | Ht 69.0 in | Wt 282.2 lb

## 2015-10-12 DIAGNOSIS — N946 Dysmenorrhea, unspecified: Secondary | ICD-10-CM

## 2015-10-12 DIAGNOSIS — Z8489 Family history of other specified conditions: Secondary | ICD-10-CM

## 2015-10-12 DIAGNOSIS — Z01419 Encounter for gynecological examination (general) (routine) without abnormal findings: Secondary | ICD-10-CM

## 2015-10-12 DIAGNOSIS — Z842 Family history of other diseases of the genitourinary system: Secondary | ICD-10-CM

## 2015-10-12 DIAGNOSIS — Z0000 Encounter for general adult medical examination without abnormal findings: Secondary | ICD-10-CM | POA: Diagnosis not present

## 2015-10-12 MED ORDER — NYSTATIN-TRIAMCINOLONE 100000-0.1 UNIT/GM-% EX OINT: 1.0000 "application " | TOPICAL_OINTMENT | Freq: Two times a day (BID) | CUTANEOUS | Status: DC

## 2015-10-12 NOTE — Patient Instructions (Signed)
1.  No Pap smear is done; due next year. 2.  Continue with healthy eating and exercise with weight loss. 3.  Return in 1 year for annual.

## 2015-10-12 NOTE — Progress Notes (Signed)
Patient ID: Molly Stone, female   DOB: [birth year], 34 y.o.   MRN: [id number] ANNUAL PREVENTATIVE CARE GYN  ENCOUNTER NOTE  Subjective:       Molly Stone is a 34 y.o. [id number] female here for a routine annual gynecologic exam.  Current complaints: 1.  A lot of d/c at times- clear pos odor    Gynecologic History Patient's last menstrual period was 09/22/2015 (approximate). Contraception: vasectomy and Nexplanon Last Pap: 09/2013 asucs/neg. Results were: abnormal Last mammogram: n/a. Results were: n/a  Obstetric History OB History  Gravida Para Term Preterm AB SAB TAB Ectopic Multiple Living  6 3 3  3 2 1   2     # Outcome Date GA Lbr Len/2nd Weight Sex Delivery Anes PTL Lv  6 Term 2007   6 lb 14.4 oz (3.13 kg) F Vag-Spont   Y  5 Term 2006   6 lb 1.6 oz (2.767 kg) M Vag-Spont     4 Term 2004   6 lb 14.4 oz (3.13 kg) F Vag-Spont   Y  3 SAB           2 SAB           1 TAB               Past Medical History  Diagnosis Date  . Allergy   . Asthma   . Obesity (BMI 30-39.9)   . History of nephrolithiasis   . GERD (gastroesophageal reflux disease)   . Hypertension   . Jaundice, physiologic, newborn   . Vaginal Pap smear, abnormal   . Anemia   . Chlamydia     h/o  . Headache     migraine  . Disorder of vocal cord     spasmodic dysphonia  . Dyspareunia, female   . Family history of endometriosis   . Heavy periods   . Dysmenorrhea   . Tobacco user   . Endometriosis     Past Surgical History  Procedure Laterality Date  . Dilation and curettage of uterus    . Wisdom tooth extraction    . Laparoscopy      Current Outpatient Prescriptions on File Prior to Visit  Medication Sig Dispense Refill  . Fish Oil-Cholecalciferol (FISH OIL + D3 PO) Take by mouth.    . fluticasone (FLONASE) 50 MCG/ACT nasal spray Place 2 sprays into both nostrils daily. 16 g 6  . Multiple Vitamin (MULTI-VITAMINS) TABS Take by mouth.     No current facility-administered medications on file  prior to visit.    Allergies  Allergen Reactions  . Morphine And Related Itching    And shaking  . Buprenorphine Hcl Itching    And shaking  . Prednisone Anxiety    Social History   Social History  . Marital Status: Married    Spouse Name: N/A  . Number of Children: N/A  . Years of Education: N/A   Occupational History  . Not on file.   Social History Main Topics  . Smoking status: Former Smoker    Types: Cigarettes    Quit date: 02/15/2014  . Smokeless tobacco: Never Used  . Alcohol Use: No     Comment: wine occassionally  . Drug Use: No  . Sexual Activity: Yes     Comment: vasectomy   Other Topics Concern  . Not on file   Social History Narrative    Family History  Problem Relation Age of Onset  . Endometriosis Mother   .  Cancer Father     benign liver CA  . Stroke Father     recurrent blood clots on the brain   . Cancer Maternal Grandmother 30    ovarian Ca, still surviving   . Osteoporosis Maternal Grandmother   . Heart disease Maternal Grandfather   . Cancer Maternal Grandfather 83    colon CA. metastatic  . Diabetes Maternal Uncle     The following portions of the patient's history were reviewed and updated as appropriate: allergies, current medications, past family history, past medical history, past social history, past surgical history and problem list.  Review of Systems ROS Review of Systems - General ROS: negative for - chills, fatigue, fever, hot flashes, night sweats, weight gain or weight loss Psychological ROS: negative for - anxiety, decreased libido, depression, mood swings, physical abuse or sexual abuse Ophthalmic ROS: negative for - blurry vision, eye pain or loss of vision ENT ROS: negative for - headaches, hearing change, visual changes or vocal changes Allergy and Immunology ROS: negative for - hives, itchy/watery eyes or seasonal allergies Hematological and Lymphatic ROS: negative for - bleeding problems, bruising, swollen  lymph nodes or weight loss Endocrine ROS: negative for - galactorrhea, hair pattern changes, hot flashes, malaise/lethargy, mood swings, palpitations, polydipsia/polyuria, skin changes, temperature intolerance or unexpected weight changes Breast ROS: negative for - new or changing breast lumps or nipple discharge Respiratory ROS: negative for - cough or shortness of breath Cardiovascular ROS: negative for - chest pain, irregular heartbeat, palpitations or shortness of breath Gastrointestinal ROS: no abdominal pain, change in bowel habits, or black or bloody stools Genito-Urinary ROS: no dysuria, trouble voiding, or hematuria Musculoskeletal ROS: negative for - joint pain or joint stiffness Neurological ROS: negative for - bowel and bladder control changes Dermatological ROS: negative for rash and skin lesion changes   Objective:   BP 119/82 mmHg  Pulse 93  Ht 5\' 9"  (1.753 m)  Wt 282 lb 3.2 oz (128.005 kg)  BMI 41.65 kg/m2  LMP 09/22/2015 (Approximate) CONSTITUTIONAL: Well-developed, well-nourished female in no acute distress.  PSYCHIATRIC: Normal mood and affect. Normal behavior. Normal judgment and thought content. [place]: Alert and oriented to person, place, and time. Normal muscle tone coordination. No cranial nerve deficit noted. HENT:  Normocephalic, atraumatic EYES: Conjunctivae and EOM are normal. NECK: Normal range of motion, supple, no masses.  Normal thyroid.  SKIN: Skin is warm and dry. No rash noted. Not diaphoretic. No erythema. No pallor. CARDIOVASCULAR: Normal heart rate noted, regular rhythm, no murmur. RESPIRATORY: Clear to auscultation bilaterally. Effort and breath sounds normal, no problems with respiration noted. BREASTS: Symmetric in size. No masses, skin changes, nipple drainage, or lymphadenopathy. ABDOMEN: Soft, normal bowel sounds, no distention noted.  No tenderness, rebound or guarding.  BLADDER: Normal PELVIC:  External Genitalia: Normal  BUS:  Normal  Vagina: Normal  Cervix: minimal cervical motion  Uterus: Normal  Adnexa: Normal  RV: External Exam NormaI  MUSCULOSKELETAL: Normal range of motion. No tenderness.  No cyanosis, clubbing, or edema.  2+ distal pulses. LYMPHATIC: No Axillary, Supraclavicular, or Inguinal Adenopathy.    Assessment:   Annual gynecologic examination 34 y.o. Contraception: vasectomy; Nexplanon bmi- 90 History of endometriosis, stable  Plan:  Pap: ? Mammogram: Not Indicated Stool Guaiac Testing:  Not Indicated Labs: thru pcp Routine preventative health maintenance measures emphasized: Exercise/Diet/Weight control, Tobacco Warnings and Alcohol/Substance use risks Return to [place], CMA Brayton Mars, MD  Note: This dictation was prepared  with Dragon dictation along with smaller phrase technology. Any transcriptional errors that result from this process are unintentional.

## 2015-10-24 ENCOUNTER — Encounter: Payer: Self-pay | Admitting: Obstetrics and Gynecology

## 2015-10-24 ENCOUNTER — Ambulatory Visit (INDEPENDENT_AMBULATORY_CARE_PROVIDER_SITE_OTHER): Payer: Commercial Managed Care - PPO | Admitting: Internal Medicine

## 2015-10-24 ENCOUNTER — Encounter: Payer: Self-pay | Admitting: Internal Medicine

## 2015-10-24 VITALS — BP 118/78 | HR 83 | Temp 98.3°F | Resp 12 | Ht 69.0 in | Wt 285.5 lb

## 2015-10-24 DIAGNOSIS — N920 Excessive and frequent menstruation with regular cycle: Secondary | ICD-10-CM | POA: Diagnosis not present

## 2015-10-24 DIAGNOSIS — R0683 Snoring: Secondary | ICD-10-CM

## 2015-10-24 DIAGNOSIS — G44229 Chronic tension-type headache, not intractable: Secondary | ICD-10-CM

## 2015-10-24 DIAGNOSIS — F411 Generalized anxiety disorder: Secondary | ICD-10-CM

## 2015-10-24 DIAGNOSIS — R5381 Other malaise: Secondary | ICD-10-CM

## 2015-10-24 DIAGNOSIS — R5383 Other fatigue: Secondary | ICD-10-CM

## 2015-10-24 MED ORDER — ESCITALOPRAM OXALATE 10 MG PO TABS: 10.0000 mg | ORAL_TABLET | Freq: Every day | ORAL | Status: DC

## 2015-10-24 MED ORDER — METHOCARBAMOL 500 MG PO TABS
500.0000 mg | ORAL_TABLET | Freq: Every evening | ORAL | Status: DC | PRN
Start: 2015-10-24 — End: 2016-07-23

## 2015-10-24 NOTE — Progress Notes (Signed)
Pre-visit discussion using our clinic review tool. No additional management support is needed unless otherwise documented below in the visit note.  

## 2015-10-24 NOTE — Patient Instructions (Addendum)
  I am starting you on a medication to help your anxiety and mood problems .  This is NOT A SEDATIVE.  It is meant to be taken daily to help improve your irritability  Please start the Lexapro (escitalopram) at 1/2 tablet daily in the evening for the first few days to avoid nausea.  You can increase to a full tablet after 4 days if you havenot developed side effects of nausea.  If the lexapro interferes with your sleep, take it in the morning instead    I have also prescribed a muscle relaxer to take at night or during the day (if it does not cause drowsiness) your headaches  Please return in  4 weeks ,  Or e mail me to let me know how it is helping your mood   If you do not get a call about the sleep study by the end of the week, et me know

## 2015-10-24 NOTE — Progress Notes (Signed)
Subjective:  Patient ID: Molly Stone, female    DOB: 1980/11/29  Age: 35 y.o. MRN: [id number]  CC: The primary encounter diagnosis was Snoring. Diagnoses of Morbid obesity, unspecified obesity type ([place]), Other fatigue, Menorrhagia with regular cycle, Malaise and fatigue, Generalized anxiety disorder, and Chronic tension-type headache, not intractable were also pertinent to this visit.  HPI Molly Stone presents for EVALUATION OF PERSISTENT HEADACHE  Has been recurrent for the past month, describes as a dull band acess forehead.  Becomes Sharp if she coughs or sneezes..  No neck stiffness,  No dizziness or visual  changes. Does not take NSAID s   FATIGUE. Feels tired all the time drained by the end of the day. Goes to bed at 10 pm,  Finding it harder to get up in the morning  Not sleeping well.  Feeling very irritable and stressed out, has 3 adolescent children,  Home schooling them,  Husband 's job takes hi away from home more than half of the week.  Patient frustrated at not being able to manage her day and participate in regular exercise.  Waking up gasping.  Husband says she snores.  sleep study has been ordered but not done .   Outpatient Prescriptions Prior to Visit  Medication Sig Dispense Refill  . Fish Oil-Cholecalciferol (FISH OIL + D3 PO) Take by mouth.    . fluticasone (FLONASE) 50 MCG/ACT nasal spray Place 2 sprays into both nostrils daily. 16 g 6  . Multiple Vitamin (MULTI-VITAMINS) TABS Take by mouth.    . nystatin-triamcinolone ointment (MYCOLOG) Apply 1 application topically 2 (two) times daily. 30 g 0  . omeprazole (PRILOSEC) 40 MG capsule TK 1 C PO D 30 MIN PRIOR TO A SUBSTANTIAL MEAL.  4   No facility-administered medications prior to visit.    Review of Systems;  Patient denies headache, fevers, malaise, unintentional weight loss, skin rash, eye pain, sinus congestion and sinus pain, sore throat, dysphagia,  hemoptysis , cough, dyspnea, wheezing, chest  pain, palpitations, orthopnea, edema, abdominal pain, nausea, melena, diarrhea, constipation, flank pain, dysuria, hematuria, urinary  Frequency, nocturia, numbness, tingling, seizures,  Focal weakness, Loss of consciousness,  Tremor, insomnia, depression, anxiety, and suicidal ideation.      Objective:  BP 118/78 mmHg  Pulse 83  Temp(Src) 98.3 F (36.8 C) (Oral)  Resp 12  Ht 5\' 9"  (1.753 m)  Wt 285 lb 8 oz (129.502 kg)  BMI 42.14 kg/m2  SpO2 97%  LMP 10/20/2015 (Exact Date)  BP Readings from Last 3 Encounters:  10/24/15 118/78  10/12/15 119/82  09/25/15 108/70    Wt Readings from Last 3 Encounters:  10/24/15 285 lb 8 oz (129.502 kg)  10/12/15 282 lb 3.2 oz (128.005 kg)  09/25/15 284 lb (128.822 kg)    General appearance: alert, cooperative and appears stated age Ears: normal TM's and external ear canals both ears Throat: lips, mucosa, and tongue normal; teeth and gums normal Neck: no adenopathy, no carotid bruit, supple, symmetrical, trachea midline and thyroid not enlarged, symmetric, no tenderness/mass/nodules Back: symmetric, no curvature. ROM normal. No CVA tenderness. Lungs: clear to auscultation bilaterally Heart: regular rate and rhythm, S1, S2 normal, no murmur, click, rub or gallop Abdomen: soft, non-tender; bowel sounds normal; no masses,  no organomegaly Pulses: 2+ and symmetric Skin: Skin color, texture, turgor normal. No rashes or lesions Lymph nodes: Cervical, supraclavicular, and axillary nodes normal.  No results found for: HGBA1C  Lab Results  Component Value Date   CREATININE  0.61 08/18/2015   CREATININE 0.7 06/14/2014   CREATININE 0.59* 11/16/2013    Lab Results  Component Value Date   WBC 6.6 10/24/2015   HGB 14.0 10/24/2015   HCT 42.2 10/24/2015   PLT 173.0 10/24/2015   GLUCOSE 97 08/18/2015   CHOL 188 03/19/2012   TRIG 51.0 03/19/2012   HDL 52.60 03/19/2012   LDLDIRECT 155* 08/18/2015   LDLCALC 125* 03/19/2012   ALT 17 08/18/2015    AST 16 08/18/2015   NA 137 08/18/2015   K 3.9 08/18/2015   CL 105 08/18/2015   CREATININE 0.61 08/18/2015   BUN 11 08/18/2015   CO2 23 08/18/2015   TSH 1.023 08/18/2015    No results found.  Assessment & Plan:   Problem List Items Addressed This Visit    Malaise and fatigue    Likely secondary to undiagnosed OSA  Causing headaches .  Sleep study ordered.       Generalized anxiety disorder    Aggravated by stress of home schooling her three children with little assistance from husband.  Trial of Lexapro       Tension headache, chronic    Adding muscle relaxer.  Avoiding nsaids given chronicity.       Relevant Medications   escitalopram (LEXAPRO) 10 MG tablet   methocarbamol (ROBAXIN) 500 MG tablet    Other Visit Diagnoses    Snoring    -  Primary    Relevant Orders    Ambulatory referral to Sleep Studies    Morbid obesity, unspecified obesity type [facility])        Relevant Orders    Ambulatory referral to Sleep Studies    Other fatigue        Relevant Orders    Ambulatory referral to Sleep Studies    Menorrhagia with regular cycle        Relevant Orders    CBC with Differential/Platelet (Completed)    IBC panel (Completed)       I am having Molly Stone start on escitalopram and methocarbamol. I am also having her maintain her MULTI-VITAMINS, Fish Oil-Cholecalciferol (FISH OIL + D3 PO), fluticasone, omeprazole, and nystatin-triamcinolone ointment.  Meds ordered this encounter  Medications  . escitalopram (LEXAPRO) 10 MG tablet    Sig: Take 1 tablet (10 mg total) by mouth daily.    Dispense:  30 tablet    Refill:  3  . methocarbamol (ROBAXIN) 500 MG tablet    Sig: Take 1 tablet (500 mg total) by mouth at bedtime as needed and may repeat dose one time if needed for muscle spasms.    Dispense:  30 tablet    Refill:  1    There are no discontinued medications.  Follow-up: No Follow-up on file.   Crecencio Mc, MD

## 2015-10-25 ENCOUNTER — Telehealth: Payer: Self-pay

## 2015-10-25 DIAGNOSIS — F411 Generalized anxiety disorder: Secondary | ICD-10-CM | POA: Insufficient documentation

## 2015-10-25 DIAGNOSIS — G44229 Chronic tension-type headache, not intractable: Secondary | ICD-10-CM | POA: Insufficient documentation

## 2015-10-25 LAB — CBC WITH DIFFERENTIAL/PLATELET
BASOS PCT: 1.6 % (ref 0.0–3.0)
Basophils Absolute: 0.1 10*3/uL (ref 0.0–0.1)
EOS PCT: 4.9 % (ref 0.0–5.0)
Eosinophils Absolute: 0.3 10*3/uL (ref 0.0–0.7)
HCT: 42.2 % (ref 36.0–46.0)
Hemoglobin: 14 g/dL (ref 12.0–15.0)
Lymphocytes Relative: 36 % (ref 12.0–46.0)
Lymphs Abs: 2.4 10*3/uL (ref 0.7–4.0)
MCHC: 33.1 g/dL (ref 30.0–36.0)
MCV: 88.2 fl (ref 78.0–100.0)
MONO ABS: 0.5 10*3/uL (ref 0.1–1.0)
MONOS PCT: 7.6 % (ref 3.0–12.0)
Neutro Abs: 3.3 10*3/uL (ref 1.4–7.7)
Neutrophils Relative %: 49.9 % (ref 43.0–77.0)
Platelets: 173 10*3/uL (ref 150.0–400.0)
RBC: 4.78 Mil/uL (ref 3.87–5.11)
RDW: 13 % (ref 11.5–15.5)
WBC: 6.6 10*3/uL (ref 4.0–10.5)

## 2015-10-25 LAB — IBC PANEL
Iron: 74 ug/dL (ref 42–145)
Saturation Ratios: 20.7 % (ref 20.0–50.0)
Transferrin: 255 mg/dL (ref 212.0–360.0)

## 2015-10-25 MED ORDER — NORETHINDRONE ACET-ETHINYL EST 1-20 MG-MCG PO TABS: 1.0000 | ORAL_TABLET | Freq: Every day | ORAL | Status: DC

## 2015-10-25 NOTE — Telephone Encounter (Signed)
Per pt she does NOT have a nexplanon.

## 2015-10-25 NOTE — Assessment & Plan Note (Signed)
Adding muscle relaxer.  Avoiding nsaids given chronicity.

## 2015-10-25 NOTE — Telephone Encounter (Signed)
Pt aware per mad micorgestin 1/20 erx. Appt for 3 month f/u made for 01/25/16.

## 2015-10-25 NOTE — Assessment & Plan Note (Signed)
Likely secondary to undiagnosed OSA  Causing headaches .  Sleep study ordered.

## 2015-10-25 NOTE — Assessment & Plan Note (Signed)
Aggravated by stress of home schooling her three children with little assistance from husband.  Trial of Lexapro

## 2015-10-28 ENCOUNTER — Encounter: Payer: Self-pay | Admitting: Internal Medicine

## 2015-10-31 ENCOUNTER — Other Ambulatory Visit: Payer: Self-pay | Admitting: Internal Medicine

## 2015-10-31 MED ORDER — ZOLPIDEM TARTRATE ER 6.25 MG PO TBCR: 6.2500 mg | EXTENDED_RELEASE_TABLET | Freq: Every evening | ORAL | Status: DC | PRN

## 2015-10-31 NOTE — Progress Notes (Signed)
Script faxed and patient notified.

## 2015-11-01 ENCOUNTER — Ambulatory Visit: Payer: Commercial Managed Care - PPO | Attending: Specialist

## 2015-11-01 DIAGNOSIS — G4733 Obstructive sleep apnea (adult) (pediatric): Secondary | ICD-10-CM | POA: Insufficient documentation

## 2015-11-15 ENCOUNTER — Telehealth: Payer: Self-pay | Admitting: Internal Medicine

## 2015-11-15 DIAGNOSIS — R0600 Dyspnea, unspecified: Secondary | ICD-10-CM

## 2015-11-15 NOTE — Telephone Encounter (Signed)
Her sleep study did not show any significant sleep apnea, but she did not sleep very well. Did she take the [place] that night? Has she taken it since then?

## 2015-11-15 NOTE — Telephone Encounter (Signed)
Patient stated the night of sleep test,she did take her Ambien, she has not take the Ambien since,she felt agitated.  Contact [phone]

## 2015-11-15 NOTE — Telephone Encounter (Signed)
Patient stated the Molly Stone had the opposite effect on her, she could not stay a sleep. Patient has not taken since.

## 2015-11-15 NOTE — Telephone Encounter (Signed)
If she did not sleep during he sleep study we may not have an accurate result.  Is she sleeping well without the [place]?

## 2015-11-15 NOTE — Telephone Encounter (Signed)
Left message for patient to return call to office. 

## 2015-11-15 NOTE — Telephone Encounter (Signed)
Patient stated that it still feels like when she is trying to sleep her throat closes up she constantly has to clear her throat.

## 2015-11-16 NOTE — Telephone Encounter (Signed)
I'd like to send her  to ENT for an evaluation before we try any more sedating medications, becaue I think she really does have sleep apnea but was not asleep long enough to qualify

## 2015-11-16 NOTE — Telephone Encounter (Signed)
Your referral is in process as requested. Our referral coordinator will call you when the appointment has been made.  

## 2015-11-16 NOTE — Telephone Encounter (Signed)
Spoke with the patient and she is agreeable to the ENT referral.  She asked to see an ENT in this area, had seen a prior one in [place] and was not impressed.  Thanks Please advise.

## 2015-11-28 ENCOUNTER — Ambulatory Visit: Payer: Commercial Managed Care - PPO | Admitting: Internal Medicine

## 2016-01-25 ENCOUNTER — Ambulatory Visit: Payer: Commercial Managed Care - PPO | Admitting: Obstetrics and Gynecology

## 2016-03-05 ENCOUNTER — Ambulatory Visit: Payer: Commercial Managed Care - PPO | Attending: Otolaryngology

## 2016-03-05 DIAGNOSIS — G4733 Obstructive sleep apnea (adult) (pediatric): Secondary | ICD-10-CM | POA: Insufficient documentation

## 2016-03-05 DIAGNOSIS — F5101 Primary insomnia: Secondary | ICD-10-CM | POA: Diagnosis not present

## 2016-03-08 ENCOUNTER — Other Ambulatory Visit: Payer: Self-pay

## 2016-03-08 MED ORDER — OMEPRAZOLE 40 MG PO CPDR: DELAYED_RELEASE_CAPSULE | ORAL | Status: DC

## 2016-03-11 ENCOUNTER — Other Ambulatory Visit: Payer: Self-pay

## 2016-03-11 MED ORDER — ESCITALOPRAM OXALATE 10 MG PO TABS: 10.0000 mg | ORAL_TABLET | Freq: Every day | ORAL | Status: DC

## 2016-05-29 ENCOUNTER — Ambulatory Visit: Payer: Commercial Managed Care - PPO | Attending: Otolaryngology

## 2016-05-29 DIAGNOSIS — F5101 Primary insomnia: Secondary | ICD-10-CM | POA: Diagnosis not present

## 2016-05-29 DIAGNOSIS — G4733 Obstructive sleep apnea (adult) (pediatric): Secondary | ICD-10-CM | POA: Diagnosis present

## 2016-07-23 ENCOUNTER — Encounter: Payer: Self-pay | Admitting: Internal Medicine

## 2016-07-23 ENCOUNTER — Ambulatory Visit (INDEPENDENT_AMBULATORY_CARE_PROVIDER_SITE_OTHER): Payer: Commercial Managed Care - PPO | Admitting: Internal Medicine

## 2016-07-23 DIAGNOSIS — Z23 Encounter for immunization: Secondary | ICD-10-CM

## 2016-07-23 DIAGNOSIS — Z0000 Encounter for general adult medical examination without abnormal findings: Secondary | ICD-10-CM

## 2016-07-23 DIAGNOSIS — N946 Dysmenorrhea, unspecified: Secondary | ICD-10-CM | POA: Diagnosis not present

## 2016-07-23 DIAGNOSIS — R5382 Chronic fatigue, unspecified: Secondary | ICD-10-CM

## 2016-07-23 DIAGNOSIS — Z9989 Dependence on other enabling machines and devices: Secondary | ICD-10-CM

## 2016-07-23 DIAGNOSIS — G4733 Obstructive sleep apnea (adult) (pediatric): Secondary | ICD-10-CM

## 2016-07-23 DIAGNOSIS — E559 Vitamin D deficiency, unspecified: Secondary | ICD-10-CM | POA: Diagnosis not present

## 2016-07-23 DIAGNOSIS — IMO0001 Reserved for inherently not codable concepts without codable children: Secondary | ICD-10-CM

## 2016-07-23 DIAGNOSIS — E6609 Other obesity due to excess calories: Secondary | ICD-10-CM

## 2016-07-23 DIAGNOSIS — Z6841 Body Mass Index (BMI) 40.0 and over, adult: Secondary | ICD-10-CM

## 2016-07-23 MED ORDER — ALPRAZOLAM 0.5 MG PO TABS: 0.5000 mg | ORAL_TABLET | Freq: Every evening | ORAL | 3 refills | Status: DC | PRN

## 2016-07-23 NOTE — Progress Notes (Signed)
Patient ID: Molly Stone, female    DOB: 10/09/1981  Age: 35 y.o. MRN: [id number]  The patient is here for annual  Non gyn examination and management of other chronic and acute problems.   The risk factors are reflected in the social history.  The roster of all physicians providing medical care to patient - is listed in the Snapshot section of the chart.  Home safety : The patient has smoke detectors in the home. They wear seatbelts.  There are no firearms at home. There is no violence in the home.   There is no risks for hepatitis, STDs or HIV. There is no   history of blood transfusion. They have no travel history to infectious disease endemic areas of the world.  The patient has seen their dentist in the last six month. They have seen their eye doctor in the last year.   Discussed the need for sun protection: hats, long sleeves and use of sunscreen if there is significant sun exposure.   Diet: the importance of a healthy diet is discussed. They do  Not have a healthy diet.  The benefits of regular aerobic exercise were discussed. She does not exercise . Regularly and only eats once daily. Rejected by the [facility]   Depression screen: there are no signs or vegative symptoms of depression- irritability, change in appetite, anhedonia, sadness/tearfullness.   The following portions of the patient's history were reviewed and updated as appropriate: allergies, current medications, past family history, past medical history,  past surgical history, past social history  and problem list.  Visual acuity was not assessed per patient preference since she has regular follow up with her ophthalmologist. Hearing and body mass index were assessed and reviewed.   During the course of the visit the patient was educated and counseled about appropriate screening and preventive services including : fall prevention , diabetes screening, nutrition counseling, colorectal cancer screening, and  recommended immunizations.    CC: The primary encounter diagnosis was Obesity, morbid ([place]). Diagnoses of Dysmenorrhea, Vitamin D deficiency, Chronic fatigue, Encounter for immunization, Encounter for preventive health examination, Class 3 obesity due to excess calories with serious comorbidity and body mass index (BMI) of 40.0 to 44.9 in adult ([place]), and OSA on CPAP were also pertinent to this visit.   OSA.  Did not sleep with ambien.  2nd time took Benadryl and slept,  But did not go into REM stage.  Went back for 3rd study no REM sleep . Wearing CPAP but wakes up with it off.  Only averaging 2 hours   History Uri has a past medical history of Allergy; Anemia; Asthma; Chlamydia; Disorder of vocal cord; Dysmenorrhea; Dyspareunia, female; Endometriosis; Family history of endometriosis; GERD (gastroesophageal reflux disease); Headache; Heavy periods; History of nephrolithiasis; Hypertension; Jaundice, physiologic, newborn; Obesity (BMI 30-39.9); Tobacco user; and Vaginal Pap smear, abnormal.   She has a past surgical history that includes Dilation and curettage of uterus; Wisdom tooth extraction; and laparoscopy.   Her family history includes Cancer in her father; Cancer (age of onset: 51) in her maternal grandmother; Cancer (age of onset: 79) in her maternal grandfather; Diabetes in her maternal uncle; Endometriosis in her mother; Heart disease in her maternal grandfather; Osteoporosis in her maternal grandmother; Stroke in her father.She reports that she has been smoking Cigarettes.  She has never used smokeless tobacco. She reports that she does not drink alcohol or use drugs.  Outpatient Medications Prior to Visit  Medication Sig Dispense Refill  .  escitalopram (LEXAPRO) 10 MG tablet Take 1 tablet (10 mg total) by mouth daily. 30 tablet 3  . Fish Oil-Cholecalciferol (FISH OIL + D3 PO) Take by mouth.    . fluticasone (FLONASE) 50 MCG/ACT nasal spray Place 2 sprays into both nostrils daily.  16 g 6  . Multiple Vitamin (MULTI-VITAMINS) TABS Take by mouth.    Marland Kitchen omeprazole (PRILOSEC) 40 MG capsule TK 1 C PO D 30 MIN PRIOR TO A SUBSTANTIAL MEAL. 90 capsule 2  . methocarbamol (ROBAXIN) 500 MG tablet Take 1 tablet (500 mg total) by mouth at bedtime as needed and may repeat dose one time if needed for muscle spasms. (Patient not taking: Reported on 07/23/2016) 30 tablet 1  . norethindrone-ethinyl estradiol (MICROGESTIN) 1-20 MG-MCG tablet Take 1 tablet by mouth daily. (Patient not taking: Reported on 07/23/2016) 3 Package 0  . nystatin-triamcinolone ointment (MYCOLOG) Apply 1 application topically 2 (two) times daily. (Patient not taking: Reported on 07/23/2016) 30 g 0  . zolpidem (AMBIEN CR) 6.25 MG CR tablet Take 1 tablet (6.25 mg total) by mouth at bedtime as needed for sleep. (Patient not taking: Reported on 07/23/2016) 15 tablet 0   No facility-administered medications prior to visit.     Review of Systems   Patient denies headache, fevers, malaise, unintentional weight loss, skin rash, eye pain, sinus congestion and sinus pain, sore throat, dysphagia,  hemoptysis , cough, dyspnea, wheezing, chest pain, palpitations, orthopnea, edema, abdominal pain, nausea, melena, diarrhea, constipation, flank pain, dysuria, hematuria, urinary  Frequency, nocturia, numbness, tingling, seizures,  Focal weakness, Loss of consciousness,  Tremor, insomnia, depression, anxiety, and suicidal ideation.      Objective:  BP 108/78   Pulse 89   Temp 98.2 F (36.8 C) (Oral)   Resp 10   Ht 5' 8.75" (1.746 m)   Wt 284 lb 8 oz (129 kg)   SpO2 97%   BMI 42.32 kg/m   Physical Exam  General appearance: alert, cooperative and appears stated age Head: Normocephalic, without obvious abnormality, atraumatic Eyes: conjunctivae/corneas clear. PERRL, EOM's intact. Fundi benign. Ears: normal TM's and external ear canals both ears Nose: Nares normal. Septum midline. Mucosa normal. No drainage or sinus  tenderness. Throat: lips, mucosa, and tongue normal; teeth and gums normal Neck: no adenopathy, no carotid bruit, no JVD, supple, symmetrical, trachea midline and thyroid not enlarged, symmetric, no tenderness/mass/nodules Lungs: clear to auscultation bilaterally Breasts: normal appearance, no masses or tenderness Heart: regular rate and rhythm, S1, S2 normal, no murmur, click, rub or gallop Abdomen: soft, non-tender; bowel sounds normal; no masses,  no organomegaly Extremities: extremities normal, atraumatic, no cyanosis or edema Pulses: 2+ and symmetric Skin: Skin color, texture, turgor normal. No rashes or lesions Neurologic: Alert and oriented X 3, normal strength and tone. Normal symmetric reflexes. Normal coordination and gait.     Assessment & Plan:   Problem List Items Addressed This Visit    Encounter for preventive health examination    Annual comprehensive preventive exam was done as well as an evaluation and management of chronic conditions .  During the course of the visit the patient was educated and counseled about appropriate screening and preventive services including :  diabetes screening, lipid analysis with projected  10 year  risk for CAD , nutrition counseling, breast, cervical and colorectal cancer screening, and recommended immunizations.  Printed recommendations for health maintenance screenings was given.      Obesity    Body mass index is 42.32 kg/m. I have addressed  BMI and recommended wt loss of 10% of body weight over the next 6 months using a low fat, fruit/vegetable based [place] diet and regular exercise a minimum of 5 days per week.        OSA on CPAP    Sleep study confirmed that patient has sleep apnea,  but will need alprazolam to help her tolerate CPAP       Dysmenorrhea    Lasting  A week per period ,  7 days of heavy bleeding,  Lighter on the last day,  Occurring usually every 28 days but sometimes off .  Has severe contractions.        Vitamin D deficiency   Relevant Orders   VITAMIN D 25 Hydroxy (Vit-D Deficiency, Fractures) (Completed)    Other Visit Diagnoses    Obesity, morbid ([place])    -  Primary   Relevant Orders   Comprehensive metabolic panel (Completed)   Lipid panel (Completed)   Chronic fatigue       Relevant Orders   TSH (Completed)   CBC w/Diff (Completed)   Encounter for immunization       Relevant Orders   Flu Vaccine QUAD 36+ mos IM (Completed)      I have discontinued Ms. Crossen's nystatin-triamcinolone ointment, methocarbamol, norethindrone-ethinyl estradiol, and zolpidem. I am also having her start on ALPRAZolam. Additionally, I am having her maintain her MULTI-VITAMINS, Fish Oil-Cholecalciferol (FISH OIL + D3 PO), fluticasone, omeprazole, and escitalopram.  Meds ordered this encounter  Medications  . ALPRAZolam (XANAX) 0.5 MG tablet    Sig: Take 1 tablet (0.5 mg total) by mouth at bedtime as needed for anxiety.    Dispense:  30 tablet    Refill:  3    Medications Discontinued During This Encounter  Medication Reason  . zolpidem (AMBIEN CR) 6.25 MG CR tablet   . nystatin-triamcinolone ointment (MYCOLOG)   . norethindrone-ethinyl estradiol (MICROGESTIN) 1-20 MG-MCG tablet   . methocarbamol (ROBAXIN) 500 MG tablet     Follow-up: Return in about 6 months (around 01/21/2017).   Crecencio Mc, MD

## 2016-07-23 NOTE — Progress Notes (Signed)
Pre-visit discussion using our clinic review tool. No additional management support is needed unless otherwise documented below in the visit note.  

## 2016-07-23 NOTE — Patient Instructions (Addendum)
You have to eat more frequently to lose weight,  And regular exercise (30 minutes 5 days per week) is BETTER than 2 hours 3 days per week  I recommend either using the Premier Protein shakes two times daily and one sensible meals  Carrots and celery in between as snacks   OR  Weight watchers.   I think the reason Molly Stone did not do your PAP was because the guidelines have changed  I am prescribign alprazolam to help you tolerate CPAP at night

## 2016-07-23 NOTE — Assessment & Plan Note (Signed)
Lasting  A week per period ,  7 days of heavy bleeding,  Lighter on the last day,  Occurring usually every 28 days but sometimes off .  Has severe contractions.

## 2016-07-24 DIAGNOSIS — G4733 Obstructive sleep apnea (adult) (pediatric): Secondary | ICD-10-CM | POA: Insufficient documentation

## 2016-07-24 LAB — CBC WITH DIFFERENTIAL/PLATELET
BASOS PCT: 0.4 % (ref 0.0–3.0)
Basophils Absolute: 0 10*3/uL (ref 0.0–0.1)
EOS ABS: 0.4 10*3/uL (ref 0.0–0.7)
Eosinophils Relative: 7.4 % — ABNORMAL HIGH (ref 0.0–5.0)
HCT: 42.9 % (ref 36.0–46.0)
HEMOGLOBIN: 14.6 g/dL (ref 12.0–15.0)
LYMPHS PCT: 31 % (ref 12.0–46.0)
Lymphs Abs: 1.9 10*3/uL (ref 0.7–4.0)
MCHC: 34.1 g/dL (ref 30.0–36.0)
MCV: 88.5 fl (ref 78.0–100.0)
MONOS PCT: 7.1 % (ref 3.0–12.0)
Monocytes Absolute: 0.4 10*3/uL (ref 0.1–1.0)
Neutro Abs: 3.2 10*3/uL (ref 1.4–7.7)
Neutrophils Relative %: 54.1 % (ref 43.0–77.0)
Platelets: 178 10*3/uL (ref 150.0–400.0)
RBC: 4.85 Mil/uL (ref 3.87–5.11)
RDW: 13.4 % (ref 11.5–15.5)
WBC: 6 10*3/uL (ref 4.0–10.5)

## 2016-07-24 LAB — COMPREHENSIVE METABOLIC PANEL
ALBUMIN: 3.9 g/dL (ref 3.5–5.2)
ALT: 16 U/L (ref 0–35)
AST: 18 U/L (ref 0–37)
Alkaline Phosphatase: 58 U/L (ref 39–117)
BILIRUBIN TOTAL: 0.5 mg/dL (ref 0.2–1.2)
BUN: 10 mg/dL (ref 6–23)
CALCIUM: 9 mg/dL (ref 8.4–10.5)
CHLORIDE: 103 meq/L (ref 96–112)
CO2: 28 mEq/L (ref 19–32)
CREATININE: 0.75 mg/dL (ref 0.40–1.20)
GFR: 93.78 mL/min (ref >60.00)
Glucose, Bld: 81 mg/dL (ref 70–99)
POTASSIUM: 4.3 meq/L (ref 3.5–5.1)
Sodium: 139 mEq/L (ref 135–145)
Total Protein: 7.4 g/dL (ref 6.0–8.3)

## 2016-07-24 LAB — LIPID PANEL
CHOLESTEROL: 195 mg/dL (ref 0–200)
HDL: 40.9 mg/dL (ref >39.00)
LDL CALC: 133 mg/dL — AB (ref 0–99)
NonHDL: 153.84
Total CHOL/HDL Ratio: 5
Triglycerides: 105 mg/dL (ref 0.0–149.0)
VLDL: 21 mg/dL (ref 0.0–40.0)

## 2016-07-24 LAB — TSH: TSH: 0.75 u[IU]/mL (ref 0.35–4.50)

## 2016-07-24 LAB — VITAMIN D 25 HYDROXY (VIT D DEFICIENCY, FRACTURES): VITD: 17.03 ng/mL — ABNORMAL LOW (ref 30.00–100.00)

## 2016-07-24 NOTE — Assessment & Plan Note (Signed)
Sleep study confirmed that patient has sleep apnea,  but will need alprazolam to help her tolerate CPAP

## 2016-07-24 NOTE — Assessment & Plan Note (Signed)
Body mass index is 42.32 kg/m. I have addressed  BMI and recommended wt loss of 10% of body weight over the next 6 months using a low fat, fruit/vegetable based [place] diet and regular exercise a minimum of 5 days per week.

## 2016-07-24 NOTE — Assessment & Plan Note (Signed)
Annual comprehensive preventive exam was done as well as an evaluation and management of chronic conditions .  During the course of the visit the patient was educated and counseled about appropriate screening and preventive services including :  diabetes screening, lipid analysis with projected  10 year  risk for CAD , nutrition counseling, breast, cervical and colorectal cancer screening, and recommended immunizations.  Printed recommendations for health maintenance screenings was given 

## 2016-07-25 ENCOUNTER — Other Ambulatory Visit: Payer: Self-pay | Admitting: Internal Medicine

## 2016-07-25 ENCOUNTER — Encounter: Payer: Self-pay | Admitting: Internal Medicine

## 2016-07-25 MED ORDER — ERGOCALCIFEROL 1.25 MG (50000 UT) PO CAPS: 50000.0000 [IU] | ORAL_CAPSULE | ORAL | 0 refills | Status: DC

## 2016-07-25 NOTE — Progress Notes (Signed)
d 

## 2016-07-26 DIAGNOSIS — S161XXA Strain of muscle, fascia and tendon at neck level, initial encounter: Secondary | ICD-10-CM | POA: Insufficient documentation

## 2016-07-26 DIAGNOSIS — Y92410 Unspecified street and highway as the place of occurrence of the external cause: Secondary | ICD-10-CM | POA: Diagnosis not present

## 2016-07-26 DIAGNOSIS — Y939 Activity, unspecified: Secondary | ICD-10-CM | POA: Diagnosis not present

## 2016-07-26 DIAGNOSIS — F17210 Nicotine dependence, cigarettes, uncomplicated: Secondary | ICD-10-CM | POA: Insufficient documentation

## 2016-07-26 DIAGNOSIS — S199XXA Unspecified injury of neck, initial encounter: Secondary | ICD-10-CM | POA: Diagnosis present

## 2016-07-26 DIAGNOSIS — I10 Essential (primary) hypertension: Secondary | ICD-10-CM | POA: Insufficient documentation

## 2016-07-26 DIAGNOSIS — M25512 Pain in left shoulder: Secondary | ICD-10-CM | POA: Diagnosis not present

## 2016-07-26 DIAGNOSIS — J45909 Unspecified asthma, uncomplicated: Secondary | ICD-10-CM | POA: Insufficient documentation

## 2016-07-26 DIAGNOSIS — Y999 Unspecified external cause status: Secondary | ICD-10-CM | POA: Diagnosis not present
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
## 2016-07-27 ENCOUNTER — Emergency Department
Admission: EM | Admit: 2016-07-27 | Discharge: 2016-07-27 | Disposition: A | Discharge status: Home / Self Care | Payer: Commercial Managed Care - PPO | Attending: Emergency Medicine | Admitting: Emergency Medicine

## 2016-07-27 ENCOUNTER — Emergency Department: Payer: Commercial Managed Care - PPO

## 2016-07-27 ENCOUNTER — Encounter: Payer: Self-pay | Admitting: Emergency Medicine

## 2016-07-27 DIAGNOSIS — S161XXA Strain of muscle, fascia and tendon at neck level, initial encounter: Secondary | ICD-10-CM | POA: Diagnosis not present

## 2016-07-27 DIAGNOSIS — M25512 Pain in left shoulder: Secondary | ICD-10-CM

## 2016-07-27 MED ORDER — CYCLOBENZAPRINE HCL 10 MG PO TABS
5.0000 mg | ORAL_TABLET | Freq: Once | ORAL | Status: AC
  Administered 2016-07-27: 5 mg via ORAL
  Filled 2016-07-27: qty 1

## 2016-07-27 MED ORDER — KETOROLAC TROMETHAMINE 30 MG/ML IJ SOLN
30.0000 mg | Freq: Once | INTRAMUSCULAR | Status: AC
  Administered 2016-07-27: 30 mg via INTRAMUSCULAR
  Filled 2016-07-27: qty 1

## 2016-07-27 MED ORDER — CYCLOBENZAPRINE HCL 5 MG PO TABS: ORAL_TABLET | ORAL | 0 refills | Status: DC

## 2016-07-27 MED ORDER — IBUPROFEN 800 MG PO TABS: 800.0000 mg | ORAL_TABLET | Freq: Three times a day (TID) | ORAL | 0 refills | Status: DC | PRN

## 2016-07-27 NOTE — Discharge Instructions (Signed)
1. Take medicines as needed for pain and muscle spasms (Motrin/Flexeril #15). 2. Wear sling as needed for comfort. 3. Return to the ER for worsening symptoms, persistent vomiting, difficulty breathing or other concerns.
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
## 2016-07-27 NOTE — ED Notes (Signed)
Upon walking into room doing hourly rounding pt stated "neck hurts". When asked pain level, pt stated "between 6 and 7." Pt states "hurts worse when I turn my head to the left." Informed pt that I will let Wells Guiles know about pain.

## 2016-07-31 ENCOUNTER — Encounter: Payer: Self-pay | Admitting: Internal Medicine

## 2016-07-31 ENCOUNTER — Telehealth: Payer: Self-pay | Admitting: *Deleted

## 2016-07-31 NOTE — Telephone Encounter (Signed)
Please schedule patient for 11;30  Friday>

## 2016-07-31 NOTE — Telephone Encounter (Signed)
Scheduled

## 2016-07-31 NOTE — Telephone Encounter (Signed)
Can we use Friday at 11.30?
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
## 2016-07-31 NOTE — Telephone Encounter (Signed)
yes

## 2016-08-02 ENCOUNTER — Ambulatory Visit (INDEPENDENT_AMBULATORY_CARE_PROVIDER_SITE_OTHER): Payer: Self-pay | Admitting: Internal Medicine

## 2016-08-02 ENCOUNTER — Encounter: Payer: Self-pay | Admitting: Internal Medicine

## 2016-08-02 VITALS — BP 140/80 | HR 98 | Temp 98.2°F | Wt 243.0 lb

## 2016-08-02 DIAGNOSIS — S134XXD Sprain of ligaments of cervical spine, subsequent encounter: Secondary | ICD-10-CM

## 2016-08-02 DIAGNOSIS — S060X0D Concussion without loss of consciousness, subsequent encounter: Secondary | ICD-10-CM

## 2016-08-02 MED ORDER — HYDROCODONE-ACETAMINOPHEN 10-325 MG PO TABS: 1.0000 | ORAL_TABLET | Freq: Three times a day (TID) | ORAL | 0 refills | Status: DC | PRN
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
## 2016-08-02 NOTE — Progress Notes (Signed)
Pre visit review using our clinic review tool, if applicable. No additional management support is needed unless otherwise documented below in the visit note. 
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
## 2016-08-04 DIAGNOSIS — S060X0A Concussion without loss of consciousness, initial encounter: Secondary | ICD-10-CM

## 2016-08-04 DIAGNOSIS — S134XXD Sprain of ligaments of cervical spine, subsequent encounter: Secondary | ICD-10-CM | POA: Insufficient documentation

## 2016-08-04 HISTORY — DX: Concussion without loss of consciousness, initial encounter: S06.0X0A

## 2016-08-04 NOTE — Assessment & Plan Note (Signed)
Continue steroid taper,  Muscle relaxer , and add soft cervical collar for neck muscle support. Addidnd vicdoin for headache pain
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
## 2016-08-05 ENCOUNTER — Encounter: Payer: Self-pay | Admitting: Internal Medicine

## 2016-10-15 ENCOUNTER — Other Ambulatory Visit: Payer: Self-pay | Admitting: Internal Medicine

## 2016-10-23 ENCOUNTER — Ambulatory Visit (INDEPENDENT_AMBULATORY_CARE_PROVIDER_SITE_OTHER): Payer: Commercial Managed Care - PPO | Admitting: Family

## 2016-10-23 ENCOUNTER — Encounter: Payer: Self-pay | Admitting: Family

## 2016-10-23 VITALS — BP 128/72 | HR 92 | Temp 98.1°F | Resp 14 | Wt 289.0 lb

## 2016-10-23 DIAGNOSIS — J0120 Acute ethmoidal sinusitis, unspecified: Secondary | ICD-10-CM

## 2016-10-23 DIAGNOSIS — J029 Acute pharyngitis, unspecified: Secondary | ICD-10-CM | POA: Diagnosis not present

## 2016-10-23 LAB — POCT RAPID STREP A (OFFICE): Rapid Strep A Screen: NEGATIVE

## 2016-10-23 MED ORDER — AMOXICILLIN 500 MG PO CAPS: 500.0000 mg | ORAL_CAPSULE | Freq: Two times a day (BID) | ORAL | 0 refills | Status: DC

## 2016-10-23 NOTE — Patient Instructions (Addendum)
Let's treat sinus infection  PROBIOTICS   However concerned that loss of grandmother and depression is playing a role  Follow up one month, sooner, if symptoms dont improve  Increase intake of clear fluids. Congestion is best treated by hydration, when mucus is wetter, it is thinner, less sticky, and easier to expel from the body, either through coughing up drainage, or by blowing your nose.   Get plenty of rest.   Use saline nasal drops and blow your nose frequently. Run a humidifier at night and elevate the head of the bed. Vicks Vapor rub will help with congestion and cough. Steam showers and sinus massage for congestion.   Use Acetaminophen or Ibuprofen as needed for fever or pain. Avoid second hand smoke. Even the smallest exposure will worsen symptoms.   Over the counter medications you can try include Delsym for cough, a decongestant for congestion, and Mucinex or Robitussin as an expectorant. Be sure to just get the plain Mucinex or Robitussin that just has one medication (Guaifenesen). We don't recommend the combination products. Note, be sure to drink two glasses of water with each dose of Mucinex as the medication will not work well without adequate hydration.   You can also try a teaspoon of honey to see if this will help reduce cough. Throat lozenges can sometimes be beneficial as well.    This illness will typically last 7 - 10 days.   Please follow up with our clinic if you develop a fever greater than 101 F, symptoms worsen, or do not resolve in the next week.

## 2016-10-23 NOTE — Progress Notes (Signed)
Subjective:    Patient ID: Molly Stone, female    DOB: December 02, 1980, 36 y.o.   MRN: [id number]  CC: Molly Stone is a 36 y.o. female who presents today for an acute visit.    HPI: CC: cough, thin congestion x one month , however 'didn't pay attention to it'. Endorses jaw pain, sinus pressure, nasal congestion, post nasal drip. No cavity, abscesses in mouth; UTD dentist.  Tried advil cold and sinus, mucinex , albuterol with some relief. No wheezing , SOB, fever.   [facility], had CXR, had antibiotic.   Grandmother recently passed away whom she cared for for past year.   Feeling better as weeks have passed. Sleeping a lot. Uses xanax for cipap machine.  No thoughts of hurting herself of anyone else.   H/o asthma as child Seasonal allergies, take zyrtec everyday.          HISTORY:  Past Medical History:  Diagnosis Date  . Allergy   . Anemia   . Asthma   . Chlamydia    h/o  . Disorder of vocal cord    spasmodic dysphonia  . Dysmenorrhea   . Dyspareunia, female   . Endometriosis   . Family history of endometriosis   . GERD (gastroesophageal reflux disease)   . Headache    migraine  . Heavy periods   . History of nephrolithiasis   . Hypertension   . Jaundice, physiologic, newborn   . Obesity (BMI 30-39.9)   . Tobacco user   . Vaginal Pap smear, abnormal    Past Surgical History:  Procedure Laterality Date  . DILATION AND CURETTAGE OF UTERUS    . laparoscopy    . WISDOM TOOTH EXTRACTION     Family History  Problem Relation Age of Onset  . Endometriosis Mother   . Cancer Father     benign liver CA  . Stroke Father     recurrent blood clots on the brain   . Cancer Maternal Grandmother 30    ovarian Ca, still surviving   . Osteoporosis Maternal Grandmother   . Heart disease Maternal Grandfather   . Cancer Maternal Grandfather 80    colon CA. metastatic  . Diabetes Maternal Uncle     Allergies: Morphine and related;  Buprenorphine hcl; and Prednisone Current Outpatient Prescriptions on File Prior to Visit  Medication Sig Dispense Refill  . ALPRAZolam (XANAX) 0.5 MG tablet Take 1 tablet (0.5 mg total) by mouth at bedtime as needed for anxiety. 30 tablet 3  . cyclobenzaprine (FLEXERIL) 5 MG tablet 1 tablet every 8 hours as needed for muscle spasms 15 tablet 0  . escitalopram (LEXAPRO) 10 MG tablet Take 1 tablet (10 mg total) by mouth daily. 30 tablet 3  . Fish Oil-Cholecalciferol (FISH OIL + D3 PO) Take by mouth.    . fluticasone (FLONASE) 50 MCG/ACT nasal spray Place 2 sprays into both nostrils daily. 16 g 6  . HYDROcodone-acetaminophen (NORCO) 10-325 MG tablet Take 1 tablet by mouth every 8 (eight) hours as needed. For severe pain 30 tablet 0  . ibuprofen (ADVIL,MOTRIN) 800 MG tablet Take 1 tablet (800 mg total) by mouth every 8 (eight) hours as needed for moderate pain. 15 tablet 0  . Multiple Vitamin (MULTI-VITAMINS) TABS Take by mouth.    Marland Kitchen omeprazole (PRILOSEC) 40 MG capsule TK 1 C PO D 30 MIN PRIOR TO A SUBSTANTIAL MEAL. 90 capsule 2  . Vitamin D, Ergocalciferol, (DRISDOL)  50000 units CAPS capsule TAKE 1 CAPSULE ONCE A WEEK 12 capsule 0   No current facility-administered medications on file prior to visit.     Social History  Substance Use Topics  . Smoking status: Current Some Day Smoker    Types: Cigarettes    Last attempt to quit: 02/15/2014  . Smokeless tobacco: Never Used  . Alcohol use No     Comment: wine occassionally    Review of Systems  Constitutional: Positive for fatigue. Negative for chills and fever.  HENT: Positive for congestion. Negative for ear pain, postnasal drip, sinus pressure, sore throat and trouble swallowing.   Respiratory: Positive for cough. Negative for shortness of breath and wheezing.   Cardiovascular: Negative for chest pain and palpitations.  Gastrointestinal: Negative for nausea and vomiting.  Psychiatric/Behavioral: Positive for sleep disturbance. Negative  for suicidal ideas. The patient is nervous/anxious.       Objective:    BP 128/72   Pulse 92   Temp 98.1 F (36.7 C) (Oral)   Resp 14   Wt 289 lb (131.1 kg)   SpO2 97%   BMI 43.94 kg/m   Wt Readings from Last 3 Encounters:  10/23/16 289 lb (131.1 kg)  08/02/16 243 lb (110.2 kg)  07/27/16 284 lb (128.8 kg)    Physical Exam  Constitutional: She appears well-developed and well-nourished.  HENT:  Head: Normocephalic and atraumatic.  Right Ear: Hearing, tympanic membrane, external ear and ear canal normal. No drainage, swelling or tenderness. No foreign bodies. Tympanic membrane is not erythematous and not bulging. No middle ear effusion. No decreased hearing is noted.  Left Ear: Hearing, tympanic membrane, external ear and ear canal normal. No drainage, swelling or tenderness. No foreign bodies. Tympanic membrane is not erythematous and not bulging.  No middle ear effusion. No decreased hearing is noted.  Nose: Nose normal. No rhinorrhea. Right sinus exhibits no maxillary sinus tenderness and no frontal sinus tenderness. Left sinus exhibits no maxillary sinus tenderness and no frontal sinus tenderness.  Mouth/Throat: Uvula is midline, oropharynx is clear and moist and mucous membranes are normal. No oropharyngeal exudate, posterior oropharyngeal edema, posterior oropharyngeal erythema or tonsillar abscesses.  Eyes: Conjunctivae are normal.  Cardiovascular: Regular rhythm, normal heart sounds and normal pulses.   Pulmonary/Chest: Effort normal and breath sounds normal. She has no wheezes. She has no rhonchi. She has no rales.  Lymphadenopathy:       Head (right side): No submental, no submandibular, no tonsillar, no preauricular, no posterior auricular and no occipital adenopathy present.       Head (left side): No submental, no submandibular, no tonsillar, no preauricular, no posterior auricular and no occipital adenopathy present.    She has no cervical adenopathy.  Neurological: She  is alert.  Skin: Skin is warm and dry.  Psychiatric: She has a normal mood and affect. Her speech is normal and behavior is normal. Thought content normal.  Vitals reviewed.      Assessment & Plan:   1. Sore throat Strep negative.  - POCT rapid strep A  2. Acute non-recurrent ethmoidal sinusitis Afebrile. Based on description of jaw pain and duration of symptoms, patient I jointly agreed to treat with antibiotics for acute sinusitis. She does not recall the prior antibiotic she had approximately 1 one month ago and I am unable to see those records. I encouraged probiotics due to recent antibiotic exposure.  We also discussed follow-up visit in one month, as I have concerns that patient grandmother's death  and fatigue is related to depression, which she agreed with. Follow-up in one month.   - amoxicillin (AMOXIL) 500 MG capsule; Take 1 capsule (500 mg total) by mouth 2 (two) times daily.  Dispense: 14 capsule; Refill: 0    I am having Ms. Schranz start on amoxicillin. I am also having her maintain her MULTI-VITAMINS, Fish Oil-Cholecalciferol (FISH OIL + D3 PO), fluticasone, omeprazole, escitalopram, ALPRAZolam, cyclobenzaprine, ibuprofen, HYDROcodone-acetaminophen, and Vitamin D (Ergocalciferol).   Meds ordered this encounter  Medications  . amoxicillin (AMOXIL) 500 MG capsule    Sig: Take 1 capsule (500 mg total) by mouth 2 (two) times daily.    Dispense:  14 capsule    Refill:  0    Order Specific Question:   Supervising Provider    Answer:   Crecencio Mc [[id number]]    Return precautions given.   Risks, benefits, and alternatives of the medications and treatment plan prescribed today were discussed, and patient expressed understanding.   Education regarding symptom management and diagnosis given to patient on AVS.  Continue to follow with TULLO, Aris Everts, MD for routine health maintenance.   Molly Stone and I agreed with plan.   Mable Paris, FNP

## 2016-10-23 NOTE — Progress Notes (Signed)
Pre visit review using our clinic review tool, if applicable. No additional management support is needed unless otherwise documented below in the visit note. 

## 2016-12-05 ENCOUNTER — Telehealth: Payer: Self-pay | Admitting: Internal Medicine

## 2016-12-05 DIAGNOSIS — S134XXD Sprain of ligaments of cervical spine, subsequent encounter: Secondary | ICD-10-CM

## 2016-12-05 DIAGNOSIS — S134XXS Sprain of ligaments of cervical spine, sequela: Secondary | ICD-10-CM

## 2016-12-05 DIAGNOSIS — R202 Paresthesia of skin: Secondary | ICD-10-CM

## 2016-12-05 DIAGNOSIS — R200 Anesthesia of skin: Secondary | ICD-10-CM
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
## 2016-12-05 NOTE — Telephone Encounter (Signed)
Patient call states she is having bilateral leg numbness in legs comes and goes all throughout the day, back pain constant and achey, pain if sit or stand too long, Neck still bothering has  numbness in arms but not all the time, still having headaches.  , sometimes feels like migraine. Ibuprofen , aleve no help.   Seen Physical therapy tried stretches, heat therapy/ice no help. Patient aware your out office will not be back in until tomorrow.  Please advise.

## 2016-12-05 NOTE — Telephone Encounter (Signed)
I have not seen her since October,  So I cannot recommend anything without seeing her  But  I Recommend she have an MRI of her cervical spine  And follow up with  me after the MRI. I will order the MRI

## 2016-12-06 NOTE — Telephone Encounter (Signed)
Patient advised of below and verbalized an understanding  

## 2016-12-18 ENCOUNTER — Ambulatory Visit
Admission: RE | Admit: 2016-12-18 | Discharge: 2016-12-18 | Disposition: A | Discharge status: Home / Self Care | Payer: Commercial Managed Care - PPO | Source: Ambulatory Visit | Attending: Internal Medicine | Admitting: Internal Medicine

## 2016-12-18 DIAGNOSIS — M50221 Other cervical disc displacement at C4-C5 level: Secondary | ICD-10-CM | POA: Diagnosis not present

## 2016-12-18 DIAGNOSIS — R200 Anesthesia of skin: Secondary | ICD-10-CM

## 2016-12-18 DIAGNOSIS — S134XXS Sprain of ligaments of cervical spine, sequela: Secondary | ICD-10-CM

## 2016-12-18 DIAGNOSIS — R202 Paresthesia of skin: Secondary | ICD-10-CM

## 2016-12-18 DIAGNOSIS — M50222 Other cervical disc displacement at C5-C6 level: Secondary | ICD-10-CM | POA: Diagnosis not present

## 2016-12-20 ENCOUNTER — Encounter: Payer: Self-pay | Admitting: Family Medicine

## 2016-12-20 ENCOUNTER — Other Ambulatory Visit: Payer: Self-pay | Admitting: Family Medicine

## 2016-12-20 ENCOUNTER — Ambulatory Visit (INDEPENDENT_AMBULATORY_CARE_PROVIDER_SITE_OTHER): Payer: Commercial Managed Care - PPO | Admitting: Family Medicine

## 2016-12-20 ENCOUNTER — Ambulatory Visit
Admission: RE | Admit: 2016-12-20 | Discharge: 2016-12-20 | Disposition: A | Discharge status: Home / Self Care | Payer: Commercial Managed Care - PPO | Source: Ambulatory Visit | Attending: Family Medicine | Admitting: Family Medicine

## 2016-12-20 VITALS — BP 120/78 | HR 95 | Temp 98.5°F | Wt 295.6 lb

## 2016-12-20 DIAGNOSIS — J42 Unspecified chronic bronchitis: Secondary | ICD-10-CM

## 2016-12-20 DIAGNOSIS — R0602 Shortness of breath: Secondary | ICD-10-CM

## 2016-12-20 DIAGNOSIS — J209 Acute bronchitis, unspecified: Secondary | ICD-10-CM | POA: Insufficient documentation

## 2016-12-20 DIAGNOSIS — R059 Cough, unspecified: Secondary | ICD-10-CM

## 2016-12-20 DIAGNOSIS — R918 Other nonspecific abnormal finding of lung field: Secondary | ICD-10-CM | POA: Diagnosis not present

## 2016-12-20 DIAGNOSIS — R002 Palpitations: Secondary | ICD-10-CM

## 2016-12-20 DIAGNOSIS — R05 Cough: Secondary | ICD-10-CM

## 2016-12-20 LAB — POCT INFLUENZA A/B
Influenza A, POC: NEGATIVE
Influenza B, POC: NEGATIVE

## 2016-12-20 LAB — POCT URINE PREGNANCY: PREG TEST UR: NEGATIVE

## 2016-12-20 MED ORDER — IOPAMIDOL (ISOVUE-370) INJECTION 76%
75.0000 mL | Freq: Once | INTRAVENOUS | Status: AC | PRN
  Administered 2016-12-20: 75 mL via INTRAVENOUS

## 2016-12-20 MED ORDER — BENZONATATE 200 MG PO CAPS: 200.0000 mg | ORAL_CAPSULE | Freq: Two times a day (BID) | ORAL | 0 refills | Status: DC | PRN

## 2016-12-20 NOTE — Patient Instructions (Signed)
Nice to see you. We are going to get a CT scan of your chest to rule out pulmonary embolism given your symptoms. If you develop any worsening symptoms, chest pain, cough productive of blood, or any new or change in symptoms please seek medical attention immediately.

## 2016-12-20 NOTE — Progress Notes (Signed)
Tommi Rumps, MD Phone: [phone]  Molly Stone is a 36 y.o. female who presents today for same-day visit.  Patient notes 6 days ago sudden onset left sided thoracic stabbing back discomfort associated with shortness of breath and palpitations. Has had a heavy sensation in her chest at times. Has had some cough though has no blood in it. Also some sneezing. No unilateral leg swelling. No recent surgeries. She did go on vacation last week and they drove 4 hours but they did get out and walk around at times. She does not have a personal history of blood clot though has family history of PE in her father and multiple PEs in a grandparent. She has had no fevers but did have some chills and felt warm at times. She has no history of hyperlipidemia, diabetes, or hypertension.  PMH: Smoker   ROS see history of present illness  Objective  Physical Exam Vitals:   12/20/16 1010  BP: 120/78  Pulse: 95  Temp: 98.5 F (36.9 C)    BP Readings from Last 3 Encounters:  12/20/16 120/78  10/23/16 128/72  08/02/16 140/80   Wt Readings from Last 3 Encounters:  12/20/16 295 lb 9.6 oz (134.1 kg)  10/23/16 289 lb (131.1 kg)  08/02/16 243 lb (110.2 kg)    Physical Exam  Constitutional: No distress.  Cardiovascular: Normal rate, regular rhythm and normal heart sounds.   Pulmonary/Chest: Effort normal and breath sounds normal. No respiratory distress. She has no wheezes. She has no rales.  Musculoskeletal: She exhibits no edema.  No midline spine tenderness, no midline spine step-off, no muscular back tenderness, not reproducible on rotation or movement of her thorax, no calf tenderness or cords palpated  Neurological: She is alert. Gait normal.  5/5 strength in bilateral biceps, triceps, grip, quads, hamstrings, plantar and dorsiflexion, sensation to light touch intact in bilateral UE and LE  Skin: She is not diaphoretic.   EKG: Normal sinus rhythm, rate 92, no ischemic  changes  Assessment/Plan: Please see individual problem list.  Shortness of breath Patient with sudden onset shortness of breath and left-sided thoracic back discomfort. This is not reproducible on exam. She does have a strong family history for VTE. Wells score of 3 given PE equally as likely to any other diagnosis given history. She had a negative rapid flu. Negative pregnancy test. Benign lung exam. Benign musculoskeletal exam. EKG was reassuring. Given concern for PE will proceed with CT angiogram of the chest. She will have a creatinine checked at the imaging center. I did discuss that if her CT was positive for PE she would be directed to the emergency room for further evaluation. She was given return precautions.   Orders Placed This Encounter  Procedures  . CT Angio Chest W/Cm &/Or Wo Cm    Standing Status:   Future    Number of Occurrences:   1    Standing Expiration Date:   03/22/2018    Order Specific Question:   If indicated for the ordered procedure, I authorize the administration of contrast media per Radiology protocol    Answer:   Yes    Order Specific Question:   Reason for Exam (SYMPTOM  OR DIAGNOSIS REQUIRED)    Answer:   sudden onset left sided thoracic back pain with shortness of breath, chest heaviness, and palpitations    Order Specific Question:   Is patient pregnant?    Answer:   No    Order Specific Question:   Preferred  imaging location?    Answer:   [facility]    Order Specific Question:   Call Results- Best Contact Number?    Answer:   [phone] hold patient    Order Specific Question:   Radiology Contrast Protocol will attach to exam    Answer:   [email or web address]  . POCT Influenza A/B  . POCT urine pregnancy  . EKG 12-Lead   Tommi Rumps, MD [place]

## 2016-12-20 NOTE — Progress Notes (Signed)
Pre visit review using our clinic review tool, if applicable. No additional management support is needed unless otherwise documented below in the visit note. 

## 2016-12-20 NOTE — Assessment & Plan Note (Addendum)
Patient with sudden onset shortness of breath and left-sided thoracic back discomfort. This is not reproducible on exam. She does have a strong family history for VTE. Wells score of 3 given PE equally as likely to any other diagnosis given history. She had a negative rapid flu. Negative pregnancy test. Benign lung exam. Benign musculoskeletal exam. EKG was reassuring. Given concern for PE will proceed with CT angiogram of the chest. She will have a creatinine checked at the imaging center. I did discuss that if her CT was positive for PE she would be directed to the emergency room for further evaluation. She was given return precautions.

## 2016-12-21 ENCOUNTER — Encounter: Payer: Self-pay | Admitting: Internal Medicine

## 2016-12-21 DIAGNOSIS — S134XXD Sprain of ligaments of cervical spine, subsequent encounter: Secondary | ICD-10-CM

## 2016-12-23 NOTE — Telephone Encounter (Signed)
  Your referral is in process to Neurology as discussed. Our referral coordinator will call you when the appointment has been made.  If you do not hear from [facility] in our office in a week,,  Please call us back

## 2017-01-07 ENCOUNTER — Encounter: Payer: Self-pay | Admitting: Internal Medicine

## 2017-01-07 ENCOUNTER — Ambulatory Visit (INDEPENDENT_AMBULATORY_CARE_PROVIDER_SITE_OTHER): Payer: Commercial Managed Care - PPO | Admitting: Internal Medicine

## 2017-01-07 VITALS — BP 102/76 | HR 89 | Temp 98.1°F | Resp 15 | Ht 68.0 in | Wt 295.2 lb

## 2017-01-07 DIAGNOSIS — R6889 Other general symptoms and signs: Secondary | ICD-10-CM | POA: Diagnosis not present

## 2017-01-07 DIAGNOSIS — B9789 Other viral agents as the cause of diseases classified elsewhere: Secondary | ICD-10-CM

## 2017-01-07 DIAGNOSIS — J069 Acute upper respiratory infection, unspecified: Secondary | ICD-10-CM

## 2017-01-07 MED ORDER — OSELTAMIVIR PHOSPHATE 75 MG PO CAPS: 75.0000 mg | ORAL_CAPSULE | Freq: Every day | ORAL | 0 refills | Status: DC

## 2017-01-07 MED ORDER — PREDNISONE 10 MG PO TABS: ORAL_TABLET | ORAL | 0 refills | Status: DC

## 2017-01-07 MED ORDER — AEROCHAMBER PLUS MISC: 2 refills | Status: DC

## 2017-01-07 MED ORDER — MONTELUKAST SODIUM 10 MG PO TABS: 10.0000 mg | ORAL_TABLET | Freq: Every day | ORAL | 3 refills | Status: DC

## 2017-01-07 MED ORDER — BENZONATATE 200 MG PO CAPS: 200.0000 mg | ORAL_CAPSULE | Freq: Two times a day (BID) | ORAL | 0 refills | Status: DC | PRN

## 2017-01-07 MED ORDER — ALBUTEROL SULFATE HFA 108 (90 BASE) MCG/ACT IN AERS: 2.0000 | INHALATION_SPRAY | Freq: Four times a day (QID) | RESPIRATORY_TRACT | 0 refills | Status: DC | PRN

## 2017-01-07 NOTE — Patient Instructions (Addendum)
You have been exposed to the flu, so you should start the tamiflu as a preventive measure TONIGHT  Adding singulair for allergies  Stop flonase given  Your  Recent nose bleeds  I will refill the albuterol MDI  FOR YOUR WHEEZING  YOU CAN ADD BENADRYL TO THE COUGH CAPSULES TO HELOP YOU REST TONIGHT  IF THE COUGH GETS WORSE YOU AND START THE PREDNISONE TAPER

## 2017-01-07 NOTE — Progress Notes (Signed)
Pre visit review using our clinic review tool, if applicable. No additional management support is needed unless otherwise documented below in the visit note. 

## 2017-01-07 NOTE — Progress Notes (Signed)
Subjective:  Patient ID: Molly Stone, female    DOB: 11-Apr-1981  Age: 36 y.o. MRN: [id number]  CC: The primary encounter diagnosis was Flu-like symptoms. A diagnosis of Viral URI with cough was also pertinent to this visit.  HPI Molly Stone presents for flu like symptoms in setting of recent exposure to infected grandfather  Who tested positive yesterday.  Flu test is negative .  Having a nosebleed . ADVISED TO STOP FLONASE.   Some wheezing when exposed to husband's smoke NEEDS TO MDI  .    Treated for bronchitis in early March by ES.  CT negative for infiltrate.  MRI  cervical spine also done in March  bc of persistent whiplash symptoms which  showed a C3 disk bulge  2 weeks ago,  Neurology referral placed. appt with [facility]ter April 16      Outpatient Medications Prior to Visit  Medication Sig Dispense Refill  . Fish Oil-Cholecalciferol (FISH OIL + D3 PO) Take by mouth.    Marland Kitchen ibuprofen (ADVIL,MOTRIN) 800 MG tablet Take 1 tablet (800 mg total) by mouth every 8 (eight) hours as needed for moderate pain. 15 tablet 0  . Multiple Vitamin (MULTI-VITAMINS) TABS Take by mouth.    . fluticasone (FLONASE) 50 MCG/ACT nasal spray Place 2 sprays into both nostrils daily. 16 g 6  . benzonatate (TESSALON) 200 MG capsule Take 1 capsule (200 mg total) by mouth 2 (two) times daily as needed for cough. (Patient not taking: Reported on 01/07/2017) 20 capsule 0  . omeprazole (PRILOSEC) 40 MG capsule TK 1 C PO D 30 MIN PRIOR TO A SUBSTANTIAL MEAL. (Patient not taking: Reported on 01/07/2017) 90 capsule 2  . Vitamin D, Ergocalciferol, (DRISDOL) 50000 units CAPS capsule TAKE 1 CAPSULE ONCE A WEEK (Patient not taking: Reported on 01/07/2017) 12 capsule 0   No facility-administered medications prior to visit.     Review of Systems;  Patient denies headache, fevers, malaise, unintentional weight loss, skin rash, eye pain, sinus congestion and sinus pain, sore throat, dysphagia,  hemoptysis , cough,  dyspnea, wheezing, chest pain, palpitations, orthopnea, edema, abdominal pain, nausea, melena, diarrhea, constipation, flank pain, dysuria, hematuria, urinary  Frequency, nocturia, numbness, tingling, seizures,  Focal weakness, Loss of consciousness,  Tremor, insomnia, depression, anxiety, and suicidal ideation.      Objective:  BP 102/76 (BP Location: Left Arm, Patient Position: Sitting, Cuff Size: Large)   Pulse 89   Temp 98.1 F (36.7 C) (Oral)   Resp 15   Ht 5\' 8"  (1.727 m)   Wt 295 lb 3.2 oz (133.9 kg)   LMP 12/08/2016 (Exact Date) Comment: neg preg test today  SpO2 97%   BMI 44.89 kg/m   BP Readings from Last 3 Encounters:  01/07/17 102/76  12/20/16 120/78  10/23/16 128/72    Wt Readings from Last 3 Encounters:  01/07/17 295 lb 3.2 oz (133.9 kg)  12/20/16 295 lb 9.6 oz (134.1 kg)  10/23/16 289 lb (131.1 kg)    General appearance: alert, cooperative and appears stated age Ears: normal TM's and external ear canals both ears Throat: lips, mucosa, and tongue normal; teeth and gums normal. Tonsillar erythema Neck: no adenopathy, no carotid bruit, supple, symmetrical, trachea midline and thyroid not enlarged, symmetric, no tenderness/mass/nodules Back: symmetric, no curvature. ROM normal. No CVA tenderness. Lungs: clear to auscultation bilaterally, no wheezing  Heart: regular rate and rhythm, S1, S2 normal, no murmur, click, rub or gallop Abdomen: soft, non-tender; bowel sounds normal; no masses,  no organomegaly Pulses: 2+ and symmetric Skin: Skin color, texture, turgor normal. No rashes or lesions Lymph nodes: Cervical, supraclavicular, and axillary nodes normal.  No results found for: HGBA1C  Lab Results  Component Value Date   CREATININE 0.75 07/23/2016   CREATININE 0.61 08/18/2015   CREATININE 0.7 06/14/2014    Lab Results  Component Value Date   WBC 6.0 07/23/2016   HGB 14.6 07/23/2016   HCT 42.9 07/23/2016   PLT 178.0 07/23/2016   GLUCOSE 81 07/23/2016    CHOL 195 07/23/2016   TRIG 105.0 07/23/2016   HDL 40.90 07/23/2016   LDLDIRECT 155 (H) 08/18/2015   LDLCALC 133 (H) 07/23/2016   ALT 16 07/23/2016   AST 18 07/23/2016   NA 139 07/23/2016   K 4.3 07/23/2016   CL 103 07/23/2016   CREATININE 0.75 07/23/2016   BUN 10 07/23/2016   CO2 28 07/23/2016   TSH 0.75 07/23/2016    Ct Angio Chest W/cm &/or Wo Cm  Result Date: 12/20/2016 CLINICAL DATA:  Short of breath, chest pain EXAM: CT ANGIOGRAPHY CHEST WITH CONTRAST TECHNIQUE: Multidetector CT imaging of the chest was performed using the standard protocol during bolus administration of intravenous contrast. Multiplanar CT image reconstructions and MIPs were obtained to evaluate the vascular anatomy. CONTRAST:  75 mL Isovue 370 IV COMPARISON:  CT chest 08/18/2010 FINDINGS: Cardiovascular: Negative for pulmonary embolism. Normal thoracic aorta. Negative for atherosclerotic calcification. Heart size normal. No pericardial effusion Mediastinum/Nodes: Negative Lungs/Pleura: 2 nodules in the right middle lobe, measuring 5 mm each. These are unchanged from the prior CT. Negative for pneumonia. Right lower lobe airspace disease has cleared since the prior study. Negative for pleural effusion or mass lesion. Upper Abdomen: Negative Musculoskeletal: Negative Review of the MIP images confirms the above findings. IMPRESSION: Negative for pulmonary embolism.  No acute abnormality in the chest Two small nodules in the right middle lobe are stable since 2011. Electronically Signed   By: Franchot Gallo M.D.   On: 12/20/2016 11:44    Assessment & Plan:   Problem List Items Addressed This Visit    Viral URI with cough    URI is most likely viral given the mild HEENT Symptoms  And normal exam.  Flu test was negative, but given her exposure,  Will treat with tamiflu x 10 days .   have explained that in viral URIS, an antibiotic will not help the symptoms and will increase the risk of developing diarrhea.,  Continue oral  and nasal decongestants,prn tylenol 650 mq 8 hrs for aches and pains,  Sinus flushes with Milta Deiters Med's rinse,  prednisone  taper for inflammation, and cough suppressant.   Advised to start antibiotic only if symptoms of bacterial sinusitis occur, and advised to take probiotic if the antibiotic is taken,  For a minimum of 3 weeks.       Relevant Medications   oseltamivir (TAMIFLU) 75 MG capsule    Other Visit Diagnoses    Flu-like symptoms    -  Primary   Relevant Orders   POC Influenza A&B (Binax test)      I have discontinued Ms. Koren's fluticasone, omeprazole, and Vitamin D (Ergocalciferol). I am also having her start on oseltamivir, montelukast, albuterol, AEROCHAMBER PLUS, and predniSONE. Additionally, I am having her maintain her MULTI-VITAMINS, Fish Oil-Cholecalciferol (FISH OIL + D3 PO), ibuprofen, RaNITidine HCl (RANITIDINE 75 PO), and benzonatate.  Meds ordered this encounter  Medications  . RaNITidine HCl (RANITIDINE 75 PO)    Sig: Take  1 tablet by mouth daily.  Marland Kitchen oseltamivir (TAMIFLU) 75 MG capsule    Sig: Take 1 capsule (75 mg total) by mouth daily.    Dispense:  10 capsule    Refill:  0  . montelukast (SINGULAIR) 10 MG tablet    Sig: Take 1 tablet (10 mg total) by mouth at bedtime.    Dispense:  30 tablet    Refill:  3  . albuterol (PROVENTIL HFA;VENTOLIN HFA) 108 (90 Base) MCG/ACT inhaler    Sig: Inhale 2 puffs into the lungs every 6 (six) hours as needed for wheezing or shortness of breath.    Dispense:  1 Inhaler    Refill:  0    Generic   Please  Use with spacer  . Spacer/Aero-Holding Chambers (AEROCHAMBER PLUS) inhaler    Sig: Use as instructed    Dispense:  1 each    Refill:  2  . predniSONE (DELTASONE) 10 MG tablet    Sig: 6 tablets on Day 1 , then reduce by 1 tablet daily until gone    Dispense:  21 tablet    Refill:  0  . benzonatate (TESSALON) 200 MG capsule    Sig: Take 1 capsule (200 mg total) by mouth 2 (two) times daily as needed for cough.     Dispense:  20 capsule    Refill:  0    Medications Discontinued During This Encounter  Medication Reason  . omeprazole (PRILOSEC) 40 MG capsule Patient has not taken in last 30 days  . Vitamin D, Ergocalciferol, (DRISDOL) 50000 units CAPS capsule Patient has not taken in last 30 days  . fluticasone (FLONASE) 50 MCG/ACT nasal spray   . benzonatate (TESSALON) 200 MG capsule Reorder    Follow-up: No Follow-up on file.   Crecencio Mc, MD

## 2017-01-08 DIAGNOSIS — B9789 Other viral agents as the cause of diseases classified elsewhere: Secondary | ICD-10-CM

## 2017-01-08 DIAGNOSIS — J069 Acute upper respiratory infection, unspecified: Secondary | ICD-10-CM | POA: Insufficient documentation

## 2017-01-08 LAB — POC INFLUENZA A&B (BINAX/QUICKVUE)
Influenza A, POC: NEGATIVE
Influenza B, POC: NEGATIVE

## 2017-01-08 NOTE — Assessment & Plan Note (Addendum)
URI is most likely viral given the mild HEENT Symptoms  And normal exam.  Flu test was negative, but given her exposure,  Will treat with tamiflu x 10 days .   have explained that in viral URIS, an antibiotic will not help the symptoms and will increase the risk of developing diarrhea.,  Continue oral and nasal decongestants,prn tylenol 650 mq 8 hrs for aches and pains,  Sinus flushes with Milta Deiters Med's rinse,  prednisone  taper for inflammation, and cough suppressant.   Advised to start antibiotic only if symptoms of bacterial sinusitis occur, and advised to take probiotic if the antibiotic is taken,  For a minimum of 3 weeks.

## 2017-01-09 ENCOUNTER — Telehealth: Payer: Self-pay | Admitting: Internal Medicine

## 2017-01-09 MED ORDER — OSELTAMIVIR PHOSPHATE 75 MG PO CAPS: 75.0000 mg | ORAL_CAPSULE | Freq: Every day | ORAL | 0 refills | Status: DC

## 2017-01-09 NOTE — Telephone Encounter (Signed)
Pt was seen this week by Dr. Derrel Nip, she has the flu. Her tamiflu was sent to her mail order and not to her pharmacy. Please send to her pharmacy, [facility] in [place].  Thanks.

## 2017-01-09 NOTE — Telephone Encounter (Signed)
Spoke with pt and informed her that her rx was resent to her local pharmacy.

## 2017-01-17 DIAGNOSIS — G4733 Obstructive sleep apnea (adult) (pediatric): Secondary | ICD-10-CM | POA: Diagnosis not present

## 2017-01-21 ENCOUNTER — Ambulatory Visit (INDEPENDENT_AMBULATORY_CARE_PROVIDER_SITE_OTHER): Payer: Commercial Managed Care - PPO | Admitting: Internal Medicine

## 2017-01-21 ENCOUNTER — Encounter: Payer: Self-pay | Admitting: Internal Medicine

## 2017-01-21 VITALS — BP 104/76 | HR 102 | Temp 98.1°F | Resp 15 | Ht 68.0 in | Wt 295.2 lb

## 2017-01-21 DIAGNOSIS — R062 Wheezing: Secondary | ICD-10-CM

## 2017-01-21 DIAGNOSIS — E559 Vitamin D deficiency, unspecified: Secondary | ICD-10-CM

## 2017-01-21 DIAGNOSIS — K2100 Gastro-esophageal reflux disease with esophagitis, without bleeding: Secondary | ICD-10-CM

## 2017-01-21 DIAGNOSIS — F411 Generalized anxiety disorder: Secondary | ICD-10-CM

## 2017-01-21 DIAGNOSIS — IMO0001 Reserved for inherently not codable concepts without codable children: Secondary | ICD-10-CM

## 2017-01-21 DIAGNOSIS — R1110 Vomiting, unspecified: Secondary | ICD-10-CM

## 2017-01-21 DIAGNOSIS — D721 Eosinophilia, unspecified: Secondary | ICD-10-CM

## 2017-01-21 DIAGNOSIS — R0602 Shortness of breath: Secondary | ICD-10-CM

## 2017-01-21 LAB — CBC WITH DIFFERENTIAL/PLATELET
BASOS PCT: 0.7 % (ref 0.0–3.0)
Basophils Absolute: 0 10*3/uL (ref 0.0–0.1)
EOS PCT: 7.3 % — AB (ref 0.0–5.0)
Eosinophils Absolute: 0.4 10*3/uL (ref 0.0–0.7)
HEMATOCRIT: 42.6 % (ref 36.0–46.0)
HEMOGLOBIN: 14.2 g/dL (ref 12.0–15.0)
LYMPHS PCT: 35.6 % (ref 12.0–46.0)
Lymphs Abs: 2.1 10*3/uL (ref 0.7–4.0)
MCHC: 33.4 g/dL (ref 30.0–36.0)
MCV: 90.9 fl (ref 78.0–100.0)
Monocytes Absolute: 0.6 10*3/uL (ref 0.1–1.0)
Monocytes Relative: 9.8 % (ref 3.0–12.0)
NEUTROS ABS: 2.8 10*3/uL (ref 1.4–7.7)
Neutrophils Relative %: 46.6 % (ref 43.0–77.0)
PLATELETS: 194 10*3/uL (ref 150.0–400.0)
RBC: 4.69 Mil/uL (ref 3.87–5.11)
RDW: 12.9 % (ref 11.5–15.5)
WBC: 6 10*3/uL (ref 4.0–10.5)

## 2017-01-21 LAB — VITAMIN D 25 HYDROXY (VIT D DEFICIENCY, FRACTURES): VITD: 22.35 ng/mL — ABNORMAL LOW (ref 30.00–100.00)

## 2017-01-21 MED ORDER — OMEPRAZOLE 20 MG PO CPDR: 20.0000 mg | DELAYED_RELEASE_CAPSULE | Freq: Every day | ORAL | 5 refills | Status: DC

## 2017-01-21 NOTE — Patient Instructions (Addendum)
I want to clarify some of your conditions over the next 6 months  1) Your reflux needs to be treated more aggressively .  I am prescribing omeprazole instead of ranitidine .  Take it daily in the morning  2) I have also ordered  Barium study of your esophagus to see if there is a stricture causing your swallowing problems    3) I don't think you have asthma. I think you have "vocal cord dysfunction"   Albuterol will  Help this,  And will only speed up your heart  Pulmonary function tests have been ordered  To clear this up.

## 2017-01-21 NOTE — Progress Notes (Signed)
Pre visit review using our clinic review tool, if applicable. No additional management support is needed unless otherwise documented below in the visit note. 

## 2017-01-21 NOTE — Progress Notes (Signed)
Subjective:  Patient ID: Molly Stone, female    DOB: Sep 10, 1981  Age: 36 y.o. MRN: [id number]  CC: The primary encounter diagnosis was Vitamin D deficiency. Diagnoses of Regurgitation, Wheezing, Eosinophilia, Shortness of breath, Generalized anxiety disorder, and Gastroesophageal reflux disease with esophagitis were also pertinent to this visit.  HPI Molly Stone presents for follow up on multiple issues complicating her ability to manage her obesity   Patient has gained 6 lbs since January   By her home scales she has lost 6 lbs using the ATkins Diet which she started on  March 20th .    Has lost 45 lbs in the past on the Atkins diet   Limiting herself to 20 mg carbs daily,  Still having acid reflux  And episodes  that cause  Her to  Regurgitate food  ,  No prior barium swallow.   has been taking ranitidine.  histoy of tobacco abuse from age 42 to 43  Lots of second hand smoke.   Drinking black coffee . Heart pounding a lot,  has not checked pulse.  Gets dizzy with it, and panics.   Has been using albuterol several times daily for report of wheezing  (told she had asthma as a child, byut no recent PFTS) told by [facility] she had spastic dysphonia in 2007   Outpatient Medications Prior to Visit  Medication Sig Dispense Refill  . albuterol (PROVENTIL HFA;VENTOLIN HFA) 108 (90 Base) MCG/ACT inhaler Inhale 2 puffs into the lungs every 6 (six) hours as needed for wheezing or shortness of breath. 1 Inhaler 0  . benzonatate (TESSALON) 200 MG capsule Take 1 capsule (200 mg total) by mouth 2 (two) times daily as needed for cough. 20 capsule 0  . Fish Oil-Cholecalciferol (FISH OIL + D3 PO) Take by mouth.    Marland Kitchen ibuprofen (ADVIL,MOTRIN) 800 MG tablet Take 1 tablet (800 mg total) by mouth every 8 (eight) hours as needed for moderate pain. 15 tablet 0  . montelukast (SINGULAIR) 10 MG tablet Take 1 tablet (10 mg total) by mouth at bedtime. 30 tablet 3  . Multiple Vitamin (MULTI-VITAMINS) TABS Take  by mouth.    . RaNITidine HCl (RANITIDINE 75 PO) Take 1 tablet by mouth daily.    Marland Kitchen Spacer/Aero-Holding Chambers (AEROCHAMBER PLUS) inhaler Use as instructed 1 each 2  . oseltamivir (TAMIFLU) 75 MG capsule Take 1 capsule (75 mg total) by mouth daily. (Patient not taking: Reported on 01/21/2017) 10 capsule 0  . predniSONE (DELTASONE) 10 MG tablet 6 tablets on Day 1 , then reduce by 1 tablet daily until gone (Patient not taking: Reported on 01/21/2017) 21 tablet 0   No facility-administered medications prior to visit.     Review of Systems;  Patient denies headache, fevers, malaise, unintentional weight loss, skin rash, eye pain, sinus congestion and sinus pain, sore throat, dysphagia,  hemoptysis , cough, dyspnea, wheezing, chest pain, palpitations, orthopnea, edema, abdominal pain, nausea, melena, diarrhea, constipation, flank pain, dysuria, hematuria, urinary  Frequency, nocturia, numbness, tingling, seizures,  Focal weakness, Loss of consciousness,  Tremor, insomnia, depression, anxiety, and suicidal ideation.      Objective:  BP 104/76   Pulse (!) 102   Temp 98.1 F (36.7 C) (Oral)   Resp 15   Ht 5\' 8"  (1.727 m)   Wt 295 lb 3.2 oz (133.9 kg)   SpO2 98%   BMI 44.89 kg/m   BP Readings from Last 3 Encounters:  01/21/17 104/76  01/07/17 102/76  12/20/16  120/78    Wt Readings from Last 3 Encounters:  01/21/17 295 lb 3.2 oz (133.9 kg)  01/07/17 295 lb 3.2 oz (133.9 kg)  12/20/16 295 lb 9.6 oz (134.1 kg)    General appearance: alert, cooperative and appears stated age Ears: normal TM's and external ear canals both ears Throat: lips, mucosa, and tongue normal; teeth and gums normal Neck: no adenopathy, no carotid bruit, supple, symmetrical, trachea midline and thyroid not enlarged, symmetric, no tenderness/mass/nodules Back: symmetric, no curvature. ROM normal. No CVA tenderness. Lungs: clear to auscultation bilaterally Heart: regular rate and rhythm, S1, S2 normal, no murmur,  click, rub or gallop Abdomen: soft, non-tender; bowel sounds normal; no masses,  no organomegaly Pulses: 2+ and symmetric Skin: Skin color, texture, turgor normal. No rashes or lesions Lymph nodes: Cervical, supraclavicular, and axillary nodes normal.  No results found for: HGBA1C  Lab Results  Component Value Date   CREATININE 0.75 07/23/2016   CREATININE 0.61 08/18/2015   CREATININE 0.7 06/14/2014    Lab Results  Component Value Date   WBC 6.0 01/21/2017   HGB 14.2 01/21/2017   HCT 42.6 01/21/2017   PLT 194.0 01/21/2017   GLUCOSE 81 07/23/2016   CHOL 195 07/23/2016   TRIG 105.0 07/23/2016   HDL 40.90 07/23/2016   LDLDIRECT 155 (H) 08/18/2015   LDLCALC 133 (H) 07/23/2016   ALT 16 07/23/2016   AST 18 07/23/2016   NA 139 07/23/2016   K 4.3 07/23/2016   CL 103 07/23/2016   CREATININE 0.75 07/23/2016   BUN 10 07/23/2016   CO2 28 07/23/2016   TSH 0.75 07/23/2016    Ct Angio Chest W/cm &/or Wo Cm  Result Date: 12/20/2016 CLINICAL DATA:  Short of breath, chest pain EXAM: CT ANGIOGRAPHY CHEST WITH CONTRAST TECHNIQUE: Multidetector CT imaging of the chest was performed using the standard protocol during bolus administration of intravenous contrast. Multiplanar CT image reconstructions and MIPs were obtained to evaluate the vascular anatomy. CONTRAST:  75 mL Isovue 370 IV COMPARISON:  CT chest 08/18/2010 FINDINGS: Cardiovascular: Negative for pulmonary embolism. Normal thoracic aorta. Negative for atherosclerotic calcification. Heart size normal. No pericardial effusion Mediastinum/Nodes: Negative Lungs/Pleura: 2 nodules in the right middle lobe, measuring 5 mm each. These are unchanged from the prior CT. Negative for pneumonia. Right lower lobe airspace disease has cleared since the prior study. Negative for pleural effusion or mass lesion. Upper Abdomen: Negative Musculoskeletal: Negative Review of the MIP images confirms the above findings. IMPRESSION: Negative for pulmonary  embolism.  No acute abnormality in the chest Two small nodules in the right middle lobe are stable since 2011. Electronically Signed   By: Franchot Gallo M.D.   On: 12/20/2016 11:44    Assessment & Plan:   Problem List Items Addressed This Visit    Generalized anxiety disorder    Episodes of racing heart likely are precipitated by overuse of albuterol. Advised to suspend MDI use until PFTs confirm or rule out asthma.       GERD (gastroesophageal reflux disease)    PPI prescribed,  Dg esophagus ordered for reports of dysphagia and regurgitation       Relevant Medications   omeprazole (PRILOSEC) 20 MG capsule   Shortness of breath    Diagnosis of asthma is in question.  PFTs ordered       Vitamin D deficiency - Primary   Relevant Orders   VITAMIN D 25 Hydroxy (Vit-D Deficiency, Fractures) (Completed)    Other Visit Diagnoses  Regurgitation       Relevant Orders   DG Esophagus   Wheezing       Relevant Orders   Pulmonary Function Test [facility] Only   Eosinophilia       Relevant Orders   CBC with Differential/Platelet (Completed)      I have discontinued Ms. Maring's predniSONE and oseltamivir. I am also having her start on omeprazole. Additionally, I am having her maintain her MULTI-VITAMINS, Fish Oil-Cholecalciferol (FISH OIL + D3 PO), ibuprofen, RaNITidine HCl (RANITIDINE 75 PO), montelukast, albuterol, AEROCHAMBER PLUS, and benzonatate.  Meds ordered this encounter  Medications  . omeprazole (PRILOSEC) 20 MG capsule    Sig: Take 1 capsule (20 mg total) by mouth daily.    Dispense:  30 capsule    Refill:  5    Medications Discontinued During This Encounter  Medication Reason  . oseltamivir (TAMIFLU) 75 MG capsule Therapy completed  . predniSONE (DELTASONE) 10 MG tablet Patient has not taken in last 30 days    Follow-up: Return in about 3 months (around 04/22/2017).   Crecencio Mc, MD

## 2017-01-22 ENCOUNTER — Other Ambulatory Visit: Payer: Self-pay | Admitting: Internal Medicine

## 2017-01-22 ENCOUNTER — Encounter: Payer: Self-pay | Admitting: Internal Medicine

## 2017-01-22 MED ORDER — ERGOCALCIFEROL 1.25 MG (50000 UT) PO CAPS: 50000.0000 [IU] | ORAL_CAPSULE | ORAL | 2 refills | Status: DC

## 2017-01-22 NOTE — Assessment & Plan Note (Signed)
Episodes of racing heart likely are precipitated by overuse of albuterol. Advised to suspend MDI use until PFTs confirm or rule out asthma.

## 2017-01-22 NOTE — Progress Notes (Signed)
drisdol 

## 2017-01-22 NOTE — Assessment & Plan Note (Signed)
PPI prescribed,  Dg esophagus ordered for reports of dysphagia and regurgitation

## 2017-01-22 NOTE — Assessment & Plan Note (Signed)
Diagnosis of asthma is in question.  PFTs ordered

## 2017-02-03 DIAGNOSIS — R202 Paresthesia of skin: Secondary | ICD-10-CM

## 2017-02-03 DIAGNOSIS — R200 Anesthesia of skin: Secondary | ICD-10-CM | POA: Diagnosis not present

## 2017-02-04 ENCOUNTER — Telehealth: Payer: Self-pay

## 2017-02-04 DIAGNOSIS — R0602 Shortness of breath: Secondary | ICD-10-CM

## 2017-02-04 NOTE — Telephone Encounter (Signed)
Can you sign off on the methacholine order. Please and thank you very much.

## 2017-02-07 ENCOUNTER — Ambulatory Visit
Admission: RE | Admit: 2017-02-07 | Discharge: 2017-02-07 | Disposition: A | Discharge status: Home / Self Care | Payer: Commercial Managed Care - PPO | Source: Ambulatory Visit | Attending: Internal Medicine | Admitting: Internal Medicine

## 2017-02-07 DIAGNOSIS — K219 Gastro-esophageal reflux disease without esophagitis: Secondary | ICD-10-CM | POA: Diagnosis not present

## 2017-02-07 DIAGNOSIS — IMO0001 Reserved for inherently not codable concepts without codable children: Secondary | ICD-10-CM

## 2017-02-07 DIAGNOSIS — R1110 Vomiting, unspecified: Secondary | ICD-10-CM | POA: Insufficient documentation

## 2017-02-09 ENCOUNTER — Encounter: Payer: Self-pay | Admitting: Internal Medicine

## 2017-02-12 ENCOUNTER — Telehealth: Payer: Self-pay | Admitting: *Deleted

## 2017-02-12 NOTE — Telephone Encounter (Signed)
Molly Stone from [facility] has requested a call for clarity on the metha cholin test that was order .  Contact [phone]

## 2017-02-13 NOTE — Telephone Encounter (Signed)
Attempted to call [facility]. No answer no voicemail. Will try again later.

## 2017-02-18 ENCOUNTER — Ambulatory Visit: Payer: Commercial Managed Care - PPO

## 2017-02-18 ENCOUNTER — Ambulatory Visit: Payer: Commercial Managed Care - PPO | Attending: Internal Medicine

## 2017-02-18 DIAGNOSIS — R0602 Shortness of breath: Secondary | ICD-10-CM | POA: Diagnosis present

## 2017-02-18 DIAGNOSIS — R062 Wheezing: Secondary | ICD-10-CM

## 2017-02-18 MED ORDER — METHACHOLINE 0.25 MG/ML NEB SOLN
2.0000 mL | Freq: Once | RESPIRATORY_TRACT | Status: AC
  Administered 2017-02-18: 0.5 mg via RESPIRATORY_TRACT
  Filled 2017-02-18: qty 2

## 2017-02-18 MED ORDER — ALBUTEROL SULFATE (2.5 MG/3ML) 0.083% IN NEBU
2.5000 mg | INHALATION_SOLUTION | Freq: Once | RESPIRATORY_TRACT | Status: DC
  Filled 2017-02-18: qty 3

## 2017-02-18 MED ORDER — SODIUM CHLORIDE 0.9 % IN NEBU
3.0000 mL | INHALATION_SOLUTION | Freq: Once | RESPIRATORY_TRACT | Status: AC
  Administered 2017-02-18: 3 mL via RESPIRATORY_TRACT

## 2017-02-18 MED ORDER — METHACHOLINE 16 MG/ML NEB SOLN
2.0000 mL | Freq: Once | RESPIRATORY_TRACT | Status: DC
Start: 2017-02-18 — End: 2019-01-22
  Filled 2017-02-18: qty 2

## 2017-02-18 MED ORDER — METHACHOLINE 4 MG/ML NEB SOLN
2.0000 mL | Freq: Once | RESPIRATORY_TRACT | Status: DC
Start: 2017-02-18 — End: 2019-01-22
  Filled 2017-02-18: qty 2

## 2017-02-18 MED ORDER — METHACHOLINE 0.0625 MG/ML NEB SOLN
2.0000 mL | Freq: Once | RESPIRATORY_TRACT | Status: AC
  Administered 2017-02-18: 0.125 mg via RESPIRATORY_TRACT
  Filled 2017-02-18: qty 2

## 2017-02-18 MED ORDER — METHACHOLINE 1 MG/ML NEB SOLN
2.0000 mL | Freq: Once | RESPIRATORY_TRACT | Status: AC
  Administered 2017-02-18: 2 mg via RESPIRATORY_TRACT
  Filled 2017-02-18: qty 2

## 2017-02-18 NOTE — Telephone Encounter (Signed)
Spoke with Larene Beach at [facility] and she stated that the pt came in today and had the methacholin testing done. She stated that the pt so fine this morning when she came in with no wheezing but that the pt did have a reaction to the test today and was fine by the time she left. Larene Beach stated that you should be able to see the results in epic by now just the dictation may not be there.

## 2017-02-19 ENCOUNTER — Encounter: Payer: Self-pay | Admitting: Internal Medicine

## 2017-02-19 DIAGNOSIS — J45909 Unspecified asthma, uncomplicated: Secondary | ICD-10-CM

## 2017-02-20 NOTE — Telephone Encounter (Signed)
  Your referral is in process as discussed to pulmonology. Our referral coordinator will call you when the appointment has been made.  If you do not hear from [facility] in our office in a week,  Please call us back

## 2017-02-25 ENCOUNTER — Ambulatory Visit: Payer: Commercial Managed Care - PPO | Attending: Internal Medicine

## 2017-02-25 DIAGNOSIS — R0602 Shortness of breath: Secondary | ICD-10-CM | POA: Diagnosis not present

## 2017-03-03 DIAGNOSIS — M4800 Spinal stenosis, site unspecified: Secondary | ICD-10-CM | POA: Diagnosis not present

## 2017-03-03 DIAGNOSIS — R202 Paresthesia of skin: Secondary | ICD-10-CM | POA: Diagnosis not present

## 2017-03-03 DIAGNOSIS — R200 Anesthesia of skin: Secondary | ICD-10-CM | POA: Diagnosis not present

## 2017-03-05 DIAGNOSIS — M4800 Spinal stenosis, site unspecified: Secondary | ICD-10-CM | POA: Insufficient documentation

## 2017-03-07 ENCOUNTER — Other Ambulatory Visit: Payer: Self-pay | Admitting: Neurology

## 2017-03-07 DIAGNOSIS — G8929 Other chronic pain: Secondary | ICD-10-CM

## 2017-03-07 DIAGNOSIS — M546 Pain in thoracic spine: Principal | ICD-10-CM

## 2017-03-14 ENCOUNTER — Ambulatory Visit: Payer: Commercial Managed Care - PPO | Admitting: Internal Medicine

## 2017-03-19 ENCOUNTER — Ambulatory Visit
Admission: RE | Admit: 2017-03-19 | Discharge: 2017-03-19 | Disposition: A | Discharge status: Home / Self Care | Payer: Commercial Managed Care - PPO | Source: Ambulatory Visit | Attending: Neurology | Admitting: Neurology

## 2017-03-19 DIAGNOSIS — G8929 Other chronic pain: Secondary | ICD-10-CM

## 2017-03-19 DIAGNOSIS — M546 Pain in thoracic spine: Principal | ICD-10-CM

## 2017-03-24 ENCOUNTER — Encounter: Payer: Self-pay | Admitting: Internal Medicine

## 2017-03-24 ENCOUNTER — Ambulatory Visit (INDEPENDENT_AMBULATORY_CARE_PROVIDER_SITE_OTHER): Payer: Commercial Managed Care - PPO | Admitting: Internal Medicine

## 2017-03-24 VITALS — BP 142/80 | HR 113 | Resp 16 | Ht 68.0 in | Wt 297.0 lb

## 2017-03-24 DIAGNOSIS — J4540 Moderate persistent asthma, uncomplicated: Secondary | ICD-10-CM | POA: Diagnosis not present

## 2017-03-24 MED ORDER — ALBUTEROL SULFATE HFA 108 (90 BASE) MCG/ACT IN AERS: 2.0000 | INHALATION_SPRAY | Freq: Four times a day (QID) | RESPIRATORY_TRACT | 3 refills | Status: DC | PRN

## 2017-03-24 MED ORDER — BUDESONIDE-FORMOTEROL FUMARATE 160-4.5 MCG/ACT IN AERO: 2.0000 | INHALATION_SPRAY | Freq: Two times a day (BID) | RESPIRATORY_TRACT | 0 refills | Status: DC

## 2017-03-24 MED ORDER — BUDESONIDE-FORMOTEROL FUMARATE 160-4.5 MCG/ACT IN AERO: 2.0000 | INHALATION_SPRAY | Freq: Two times a day (BID) | RESPIRATORY_TRACT | 5 refills | Status: DC

## 2017-03-24 NOTE — Progress Notes (Signed)
Name: Molly Stone MRN: [id number] DOB: 27-May-1981     CONSULTATION DATE: 03/24/2017  REFERRING MD :  Derrel Nip  CHIEF COMPLAINT:  ASTHMA  HISTORY OF PRESENT ILLNESS:  36 yo obese white female with ongoing cough and chest congestion Has been dx with childhood asthma Does NOT take any inhalers at this time Smokes occasionally A lot of second hand smoke exposure Childhood asthma-lots of ER visits as child +excezma +allergic rhinitis +cough and wheezing +previosu h/o URI and FLU Last prednisone awhile ago-responds well No previous intubations    I have Independently reviewed images of  CT chest on 12/2016   on 03/24/2017  Interpretation:CT chest in 12/2016 shows small subcentimeter pulm nodules that have not increased in size since 2011  PAST MEDICAL HISTORY :   has a past medical history of Allergy; Anemia; Asthma; Chlamydia; Disorder of vocal cord; Dysmenorrhea; Dyspareunia, female; Endometriosis; Family history of endometriosis; GERD (gastroesophageal reflux disease); Headache; Heavy periods; History of nephrolithiasis; Hypertension; Jaundice, physiologic, newborn; Obesity (BMI 30-39.9); Tobacco user; and Vaginal Pap smear, abnormal.  has a past surgical history that includes Dilation and curettage of uterus; Wisdom tooth extraction; and laparoscopy. Prior to Admission medications   Medication Sig Start Date End Date Taking? Authorizing Provider  fluticasone (FLONASE) 50 MCG/ACT nasal spray Place 1 spray into both nostrils daily. 06/14/14  Yes [provider]  montelukast (SINGULAIR) 10 MG tablet Take 1 tablet (10 mg total) by mouth at bedtime. 01/07/17  Yes Crecencio Mc, MD  Multiple Vitamin (MULTI-VITAMINS) TABS Take by mouth.   Yes [provider]  omeprazole (PRILOSEC) 20 MG capsule Take 1 capsule (20 mg total) by mouth daily. 01/21/17  Yes Crecencio Mc, MD  Spacer/Aero-Holding Chambers (AEROCHAMBER PLUS) inhaler Use as instructed 01/07/17  Yes Crecencio Mc, MD   Allergies  Allergen Reactions  . Morphine And Related Itching    And shaking  . Buprenorphine Hcl Itching    And shaking  . Prednisone Anxiety    FAMILY HISTORY:  family history includes Cancer in her father; Cancer (age of onset: 74) in her maternal grandmother; Cancer (age of onset: 64) in her maternal grandfather; Diabetes in her maternal uncle; Endometriosis in her mother; Heart disease in her maternal grandfather; Osteoporosis in her maternal grandmother; Stroke in her father. SOCIAL HISTORY:  reports that she has been smoking Cigarettes.  She has never used smokeless tobacco. She reports that she does not drink alcohol or use drugs.  REVIEW OF SYSTEMS:   Constitutional: Negative for fever, chills, weight loss, malaise/fatigue and diaphoresis.  HENT: Negative for hearing loss, ear pain, nosebleeds, congestion, sore throat, neck pain, tinnitus and ear discharge.   Eyes: Negative for blurred vision, double vision, photophobia, pain, discharge and redness.  Respiratory: +cough, hemoptysis, -sputum production, +shortness of breath, +wheezing and -stridor.   Cardiovascular: Negative for chest pain, palpitations, orthopnea, claudication, leg swelling and PND.  Gastrointestinal: Negative for heartburn, nausea, vomiting, abdominal pain, diarrhea, constipation, blood in stool and melena.  Genitourinary: Negative for dysuria, urgency, frequency, hematuria and flank pain.  Musculoskeletal: Negative for myalgias, back pain, joint pain and falls.  Skin: Negative for itching and rash.  Neurological: Negative for dizziness, tingling, tremors, sensory change, speech change, focal weakness, seizures, loss of consciousness, weakness and headaches.  Endo/Heme/Allergies: Negative for environmental allergies and polydipsia. Does not bruise/bleed easily.  ALL OTHER ROS ARE NEGATIVE   BP (!) 142/80 (BP Location: Left Arm, Cuff Size: Normal)   Pulse (!) 113  Resp 16   Ht 5\' 8"  (1.727 m)    Wt 297 lb (134.7 kg)   SpO2 95%   BMI 45.16 kg/m   Physical Examination:   GENERAL:NAD, no fevers, chills, no weakness no fatigue HEAD: Normocephalic, atraumatic.  EYES: Pupils equal, round, reactive to light. Extraocular muscles intact. No scleral icterus.  MOUTH: Moist mucosal membrane.   EAR, NOSE, THROAT: Clear without exudates. No external lesions.  NECK: Supple. No thyromegaly. No nodules. No JVD.  PULMONARY:CTA B/L no wheezes, no crackles, no rhonchi CARDIOVASCULAR: S1 and S2. Regular rate and rhythm. No murmurs, rubs, or gallops. No edema.  GASTROINTESTINAL: Soft, nontender, nondistended. No masses. Positive bowel sounds.  MUSCULOSKELETAL: No swelling, clubbing, or edema. Range of motion full in all extremities.  NEUROLOGIC: Cranial nerves II through XII are intact. No gross focal neurological deficits.  SKIN: No ulceration, lesions, rashes, or cyanosis. Skin warm and dry. Turgor intact.  PSYCHIATRIC: Mood, affect within normal limits. The patient is awake, alert and oriented x 3. Insight, judgment intact.     PFT's WNL Methacholine challenge +reactive airways disease in small airways  ASSESSMENT / PLAN:  36 yo morbidly obese white female with SOB/wheezing/Cough related to moderate persistent ASTHMA with allergic rhinitis with small benign pulmonary nodules  1.SOB/Wheezing-related to ASTHMA, obesity and deconditioned state  2.ASTHMA-moderate persistent -no need for systemic steroids at this time -will need to start IC/LABA combination with Symbicort 160 BID -albuterol as needed -Smoking cessation HIGHLY recommended -avoid allergens  3.Alergic rhinitis Will start OTC antihistamine and assess  4.Morbid obesity-recommend weight loss -will need to assess for OSA at next visit  5.Decondioned state -recommend exercise daily   6.pulmonaru Nodules-benign at this time No need for further CT scans unless patient continues to smoke  Patient satisfied with Plan of  action and management. All questions answered Follow up in 3 months   Molly Stone Patricia Pesa, M.D.  [facility]  Medical Director [place] Director [facility] Cardio-Pulmonary Department

## 2017-03-24 NOTE — Patient Instructions (Signed)
Start symbicort 160 Albuterol as needed STOP SMOKING!!!

## 2017-03-25 ENCOUNTER — Other Ambulatory Visit: Payer: Self-pay | Admitting: *Deleted

## 2017-03-25 MED ORDER — ALBUTEROL SULFATE HFA 108 (90 BASE) MCG/ACT IN AERS: 2.0000 | INHALATION_SPRAY | Freq: Four times a day (QID) | RESPIRATORY_TRACT | 3 refills | Status: DC | PRN

## 2017-03-26 ENCOUNTER — Emergency Department: Payer: Commercial Managed Care - PPO

## 2017-03-26 ENCOUNTER — Telehealth: Payer: Self-pay | Admitting: Internal Medicine

## 2017-03-26 ENCOUNTER — Encounter: Payer: Self-pay | Admitting: *Deleted

## 2017-03-26 ENCOUNTER — Emergency Department
Admission: EM | Admit: 2017-03-26 | Discharge: 2017-03-26 | Disposition: A | Discharge status: Home / Self Care | Payer: Commercial Managed Care - PPO | Attending: Emergency Medicine | Admitting: Emergency Medicine

## 2017-03-26 DIAGNOSIS — J45909 Unspecified asthma, uncomplicated: Secondary | ICD-10-CM | POA: Insufficient documentation

## 2017-03-26 DIAGNOSIS — J40 Bronchitis, not specified as acute or chronic: Secondary | ICD-10-CM

## 2017-03-26 DIAGNOSIS — F17210 Nicotine dependence, cigarettes, uncomplicated: Secondary | ICD-10-CM | POA: Diagnosis not present

## 2017-03-26 DIAGNOSIS — R05 Cough: Secondary | ICD-10-CM | POA: Diagnosis not present

## 2017-03-26 DIAGNOSIS — I10 Essential (primary) hypertension: Secondary | ICD-10-CM | POA: Insufficient documentation

## 2017-03-26 DIAGNOSIS — R0602 Shortness of breath: Secondary | ICD-10-CM | POA: Insufficient documentation

## 2017-03-26 DIAGNOSIS — R0789 Other chest pain: Secondary | ICD-10-CM | POA: Diagnosis present

## 2017-03-26 LAB — BASIC METABOLIC PANEL
Anion gap: 6 (ref 5–15)
BUN: 11 mg/dL (ref 6–20)
CALCIUM: 8.8 mg/dL — AB (ref 8.9–10.3)
CO2: 25 mmol/L (ref 22–32)
Chloride: 108 mmol/L (ref 101–111)
Creatinine, Ser: 0.69 mg/dL (ref 0.44–1.00)
GLUCOSE: 126 mg/dL — AB (ref 65–99)
Potassium: 3.9 mmol/L (ref 3.5–5.1)
Sodium: 139 mmol/L (ref 135–145)

## 2017-03-26 LAB — CBC
HCT: 40.4 % (ref 35.0–47.0)
HEMOGLOBIN: 14 g/dL (ref 12.0–16.0)
MCH: 30.6 pg (ref 26.0–34.0)
MCHC: 34.7 g/dL (ref 32.0–36.0)
MCV: 88.2 fL (ref 80.0–100.0)
PLATELETS: 151 10*3/uL (ref 150–440)
RBC: 4.58 MIL/uL (ref 3.80–5.20)
RDW: 13 % (ref 11.5–14.5)
WBC: 5.8 10*3/uL (ref 3.6–11.0)

## 2017-03-26 LAB — TROPONIN I

## 2017-03-26 MED ORDER — HYDROCOD POLST-CPM POLST ER 10-8 MG/5ML PO SUER: 5.0000 mL | Freq: Two times a day (BID) | ORAL | 0 refills | Status: DC

## 2017-03-26 MED ORDER — METHYLPREDNISOLONE 4 MG PO TBPK: ORAL_TABLET | ORAL | 0 refills | Status: DC

## 2017-03-26 MED ORDER — HYDROCOD POLST-CPM POLST ER 10-8 MG/5ML PO SUER
5.0000 mL | Freq: Once | ORAL | Status: AC
  Administered 2017-03-26: 5 mL via ORAL
  Filled 2017-03-26: qty 5

## 2017-03-26 NOTE — Telephone Encounter (Signed)
Patient is going to ER now her father is driving her 66.

## 2017-03-26 NOTE — ED Provider Notes (Signed)
[facility] Emergency Department Provider Note       Time seen: ----------------------------------------- 5:22 PM on 03/26/2017 -----------------------------------------     I have reviewed the triage vital signs and the nursing notes.   HISTORY   Chief Complaint Shortness of Breath    HPI Molly Stone is a 36 y.o. female who presents to the ED for shortness of breath and tightness in the chest. Patient started Symbicort this week and used a peak flow meter today which was in the red. She was told by her doctor to come to the ER for evaluation. She denies fevers, chills, vomiting or diarrhea. Pain is currently 5 out of 10 in the chest.   Past Medical History:  Diagnosis Date  . Allergy   . Anemia   . Asthma   . Chlamydia    h/o  . Disorder of vocal cord    spasmodic dysphonia  . Dysmenorrhea   . Dyspareunia, female   . Endometriosis   . Family history of endometriosis   . GERD (gastroesophageal reflux disease)   . Headache    migraine  . Heavy periods   . History of nephrolithiasis   . Hypertension   . Jaundice, physiologic, newborn   . Obesity (BMI 30-39.9)   . Tobacco user   . Vaginal Pap smear, abnormal     Patient Active Problem List   Diagnosis Date Noted  . Shortness of breath 12/20/2016  . Whiplash injury syndrome, subsequent encounter 08/04/2016  . Concussion without loss of consciousness 08/04/2016  . OSA on CPAP 07/24/2016  . Generalized anxiety disorder 10/25/2015  . Tension headache, chronic 10/25/2015  . Family history of endometriosis 10/12/2015  . Dysmenorrhea 10/12/2015  . Acid reflux 08/15/2015  . H/O renal calculi 08/15/2015  . Obesity 08/15/2015  . Other malaise and fatigue 06/16/2014  . Encounter for preventive health examination 06/16/2014  . Malaise and fatigue 06/16/2014  . Sciatica 04/01/2014  . Vitamin D deficiency 04/12/2012  . 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  . History of nephrolithiasis   . GERD  (gastroesophageal reflux disease)     Past Surgical History:  Procedure Laterality Date  . DILATION AND CURETTAGE OF UTERUS    . laparoscopy    . WISDOM TOOTH EXTRACTION      Allergies Morphine and related; Buprenorphine hcl; and Prednisone  Social History Social History  Substance Use Topics  . Smoking status: Current Some Day Smoker    Types: Cigarettes    Last attempt to quit: 02/15/2014  . Smokeless tobacco: Never Used  . Alcohol use No     Comment: wine occassionally    Review of Systems Constitutional: Negative for fever. Eyes: Negative for vision changes ENT:  Negative for congestion, positive for sore throat Cardiovascular: Positive for chest tightness Respiratory: Positive for shortness of breath and cough Gastrointestinal: Negative for abdominal pain, vomiting and diarrhea. Genitourinary: Negative for dysuria. Musculoskeletal: Negative for back pain. Skin: Negative for rash. Neurological: Negative for headaches, focal weakness or numbness.  All systems negative/normal/unremarkable except as stated in the HPI  ____________________________________________   PHYSICAL EXAM:  VITAL SIGNS: ED Triage Vitals  Enc Vitals Group     BP 03/26/17 1630 123/61     Pulse Rate 03/26/17 1630 (!) 115     Resp 03/26/17 1616 20     Temp 03/26/17 1630 97.6 F (36.4 C)     Temp Source 03/26/17 1630 Oral     SpO2 03/26/17 1616 96 %  Weight 03/26/17 1631 297 lb (134.7 kg)     Height 03/26/17 1631 5\' 8"  (1.727 m)     Head Circumference --      Peak Flow --      Pain Score 03/26/17 1630 5     Pain Loc --      Pain Edu? --      Excl. in [place]? --     Constitutional: Alert and oriented. Well appearing and in no distress. Eyes: Conjunctivae are normal. Normal extraocular movements. ENT   Head: Normocephalic and atraumatic.   Nose: No congestion/rhinnorhea.   Mouth/Throat: Mucous membranes are moist.   Neck: No stridor. Cardiovascular: Normal rate, regular  rhythm. No murmurs, rubs, or gallops. Respiratory: Normal respiratory effort without tachypnea nor retractions. Breath sounds are clear and equal bilaterally. No wheezes/rales/rhonchi.Lungs are clear even to forced expiration Gastrointestinal: Soft and nontender. Normal bowel sounds Musculoskeletal: Nontender with normal range of motion in extremities. No lower extremity tenderness nor edema. Neurologic:  Normal speech and language. No gross focal neurologic deficits are appreciated.  Skin:  Skin is warm, dry and intact. No rash noted. Psychiatric: Mood and affect are normal. Speech and behavior are normal.  ____________________________________________  EKG: Interpreted by me. Sinus tachycardia with rate 110 bpm, normal PR interval, normal QRS, normal QT.  ____________________________________________  ED COURSE:  Pertinent labs & imaging results that were available during my care of the patient were reviewed by me and considered in my medical decision making (see chart for details). Patient presents for shortness of breath and cough, we will assess with labs and imaging as indicated.   Procedures ____________________________________________   LABS (pertinent positives/negatives)  Labs Reviewed  BASIC METABOLIC PANEL - Abnormal; Notable for the following:       Result Value   Glucose, Bld 126 (*)    Calcium 8.8 (*)    All other components within normal limits  CBC  TROPONIN I    RADIOLOGY Images were viewed by me  Chest x-ray IMPRESSION: No active cardiopulmonary disease. ____________________________________________  FINAL ASSESSMENT AND PLAN  Bronchitis  Plan: Patient's labs and imaging were dictated above. Patient had presented for Cough and congestion which is likely from bronchitis. She'll be discharged with steroids as well as Tussionex. Advised at this point she does not require antibiotics.   Earleen Newport, MD   Note: This note was generated in part  or whole with voice recognition software. Voice recognition is usually quite accurate but there are transcription errors that can and very often do occur. I apologize for any typographical errors that were not detected and corrected.     Earleen Newport, MD 03/26/17 [phone]

## 2017-03-26 NOTE — Telephone Encounter (Signed)
fyi

## 2017-03-26 NOTE — Telephone Encounter (Signed)
Pt called and stated that she was c/o bronchitis symptoms and saw Dr. Mortimer Fries on Monday and he said that it was just asthma. Pt states that she has been coughing up yellow and green mucous. Pt is also feeling very weak and can hardly move. When she lays down she feels as though she can't breathe. Pt does have an appt tomorrow. Sent call to [facility] triage.  Call pt @ [phone]

## 2017-03-26 NOTE — Telephone Encounter (Signed)
Patient Name: Molly Stone DOB: 14-Jan-1981 Initial Comment Caller states that since Saturday night she has been having issues of breathing. She is complaining of bronchitis symptoms but was told was more by doctor was asthma, today she feels worse and feels weak. She is having shortness of breathe and she also had green yellow mucus. Her chest hurts from coughing. Nurse Assessment Nurse: Vallery Sa, RN, [place] Date/Time (Eastern Time): 03/26/2017 2:54:36 PM Confirm and document reason for call. If symptomatic, describe symptoms. ---Caller states she is not having a severe breathing difficulty or blueness around her lips. She has Asthma and feels that it is worse today despite taking her breathing medications. No fever. Alert and responsive. Does the patient have any new or worsening symptoms? ---Yes Will a triage be completed? ---Yes Related visit to physician within the last 2 weeks? ---Yes Does the PT have any chronic conditions? (i.e. diabetes, asthma, etc.) ---Yes List chronic conditions. ---Asthma, Sleep Apnea, Allergies, Overweight, Acid Reflux Is the patient pregnant or possibly pregnant? (Ask all females between the ages of 66-55) ---No Is this a behavioral health or substance abuse call? ---No Guidelines Guideline Title Affirmed Question Affirmed Notes Asthma Attack Peak flow rate less than 50% of baseline level (RED zone) Final Disposition User Go to ED Now Vallery Sa, RN, [facility] - ED Disagree/Comply: Comply

## 2017-03-26 NOTE — Telephone Encounter (Signed)
Patient in ED.

## 2017-03-26 NOTE — ED Triage Notes (Signed)
Pt to triage via wheelchair.  Pt has sob and tightness in chest  Pt started symbicort this week and  used peak flow today and was in the red.  Pt told by pmd to come to er for eval    Pt alert.

## 2017-03-26 NOTE — ED Notes (Signed)
Esign not working, pt verbalized understanding of discharge instructions. Pt has no questions at this time

## 2017-03-27 ENCOUNTER — Ambulatory Visit (INDEPENDENT_AMBULATORY_CARE_PROVIDER_SITE_OTHER): Payer: Commercial Managed Care - PPO | Admitting: Family

## 2017-03-27 ENCOUNTER — Encounter: Payer: Self-pay | Admitting: Family

## 2017-03-27 DIAGNOSIS — J4540 Moderate persistent asthma, uncomplicated: Secondary | ICD-10-CM

## 2017-03-27 DIAGNOSIS — J45909 Unspecified asthma, uncomplicated: Secondary | ICD-10-CM | POA: Insufficient documentation

## 2017-03-27 NOTE — Assessment & Plan Note (Signed)
Pleased to see  patient is feeling better today after emergency room visit yesterday. SaO2 95%. She is in no acute respiratory distress this morning. We discussed continuing the prednisone as well as inhalers. She will continue to follow up with pulmonology. She will also let us know if she does not continue to get better.

## 2017-03-27 NOTE — Progress Notes (Signed)
Subjective:    Patient ID: Molly Stone, female    DOB: September 13, 1981, 36 y.o.   MRN: [id number]  CC: Molly Stone is a 36 y.o. female who presents today for an acute visit.    HPI: ED yesterday for cough as thought she was having a panic attack.  Recently started on symbicort for asthma and tested with PFTs, saw Dr Mortimer Fries 4 days ago when started on symbicort.   Started on prednisone, albuterol, and tussionex. Improved today.   Endorses post nasal drip, sore throat.  Wheezing and SOB better today, responds to  'Rescue inhaler' . No fever, sinus congestion, ear pain  smoker  ED 6/6 CXR no acute findings  HISTORY:  Past Medical History:  Diagnosis Date  . Allergy   . Anemia   . Asthma   . Chlamydia    h/o  . Disorder of vocal cord    spasmodic dysphonia  . Dysmenorrhea   . Dyspareunia, female   . Endometriosis   . Family history of endometriosis   . GERD (gastroesophageal reflux disease)   . Headache    migraine  . Heavy periods   . History of nephrolithiasis   . Hypertension   . Jaundice, physiologic, newborn   . Obesity (BMI 30-39.9)   . Tobacco user   . Vaginal Pap smear, abnormal    Past Surgical History:  Procedure Laterality Date  . DILATION AND CURETTAGE OF UTERUS    . laparoscopy    . WISDOM TOOTH EXTRACTION     Family History  Problem Relation Age of Onset  . Endometriosis Mother   . Cancer Father        benign liver CA  . Stroke Father        recurrent blood clots on the brain   . Cancer Maternal Grandmother 30       ovarian Ca, still surviving   . Osteoporosis Maternal Grandmother   . Heart disease Maternal Grandfather   . Cancer Maternal Grandfather 80       colon CA. metastatic  . Diabetes Maternal Uncle     Allergies: Morphine and related; Buprenorphine hcl; and Prednisone Current Outpatient Prescriptions on File Prior to Visit  Medication Sig Dispense Refill  . albuterol (PROAIR HFA) 108 (90 Base) MCG/ACT inhaler Inhale 2 puffs  into the lungs every 6 (six) hours as needed for wheezing or shortness of breath. 18 g 3  . albuterol (PROVENTIL HFA;VENTOLIN HFA) 108 (90 Base) MCG/ACT inhaler Inhale 2 puffs into the lungs every 6 (six) hours as needed for wheezing or shortness of breath. 18 g 3  . budesonide-formoterol (SYMBICORT) 160-4.5 MCG/ACT inhaler Inhale 2 puffs into the lungs 2 (two) times daily. 1 Inhaler 5  . chlorpheniramine-HYDROcodone (TUSSIONEX PENNKINETIC ER) 10-8 MG/5ML SUER Take 5 mLs by mouth 2 (two) times daily. 140 mL 0  . fluticasone (FLONASE) 50 MCG/ACT nasal spray Place 1 spray into both nostrils daily.    . methylPREDNISolone (MEDROL) 4 MG TBPK tablet Dispense Medrol Dosepak as directed 21 tablet 0  . montelukast (SINGULAIR) 10 MG tablet Take 1 tablet (10 mg total) by mouth at bedtime. 30 tablet 3  . Multiple Vitamin (MULTI-VITAMINS) TABS Take by mouth.    Marland Kitchen omeprazole (PRILOSEC) 20 MG capsule Take 1 capsule (20 mg total) by mouth daily. 30 capsule 5  . Spacer/Aero-Holding Chambers (AEROCHAMBER PLUS) inhaler Use as instructed 1 each 2   Current Facility-Administered Medications on File Prior to Visit  Medication Dose Route  Frequency Provider Last Rate Last Dose  . methacholine (PROVOCHOLINE) inhaler solution 8 mg  2 mL Inhalation Once Crecencio Mc, MD       Followed by  . methacholine (PROVOCHOLINE) inhaler solution 32 mg  2 mL Inhalation Once Crecencio Mc, MD       Followed by  . albuterol (PROVENTIL) (2.5 MG/3ML) 0.083% nebulizer solution 2.5 mg  2.5 mg Nebulization Once Crecencio Mc, MD        Social History  Substance Use Topics  . Smoking status: Current Some Day Smoker    Types: Cigarettes    Last attempt to quit: 02/15/2014  . Smokeless tobacco: Never Used  . Alcohol use No     Comment: wine occassionally    Review of Systems  Constitutional: Negative for chills and fever.  HENT: Positive for congestion, postnasal drip and sore throat. Negative for sinus pain and sinus  pressure.   Respiratory: Positive for cough, shortness of breath and wheezing.   Cardiovascular: Negative for chest pain and palpitations.  Gastrointestinal: Negative for nausea and vomiting.      Objective:    BP 126/78   Pulse 92   Temp 97.9 F (36.6 C) (Oral)   Ht 5\' 8"  (1.727 m)   Wt 298 lb 12.8 oz (135.5 kg)   LMP 03/04/2017 (Approximate)   SpO2 95%   BMI 45.43 kg/m    Physical Exam  Constitutional: She appears well-developed and well-nourished.  HENT:  Head: Normocephalic and atraumatic.  Right Ear: Hearing, tympanic membrane, external ear and ear canal normal. No drainage, swelling or tenderness. No foreign bodies. Tympanic membrane is not erythematous and not bulging. No middle ear effusion. No decreased hearing is noted.  Left Ear: Hearing, tympanic membrane, external ear and ear canal normal. No drainage, swelling or tenderness. No foreign bodies. Tympanic membrane is not erythematous and not bulging.  No middle ear effusion. No decreased hearing is noted.  Nose: Nose normal. No rhinorrhea. Right sinus exhibits no maxillary sinus tenderness and no frontal sinus tenderness. Left sinus exhibits no maxillary sinus tenderness and no frontal sinus tenderness.  Mouth/Throat: Uvula is midline, oropharynx is clear and moist and mucous membranes are normal. No oropharyngeal exudate, posterior oropharyngeal edema, posterior oropharyngeal erythema or tonsillar abscesses.  Eyes: Conjunctivae are normal.  Cardiovascular: Regular rhythm, normal heart sounds and normal pulses.   Pulmonary/Chest: Effort normal and breath sounds normal. She has no wheezes. She has no rhonchi. She has no rales.  Lymphadenopathy:       Head (right side): No submental, no submandibular, no tonsillar, no preauricular, no posterior auricular and no occipital adenopathy present.       Head (left side): No submental, no submandibular, no tonsillar, no preauricular, no posterior auricular and no occipital  adenopathy present.    She has no cervical adenopathy.  Neurological: She is alert.  Skin: Skin is warm and dry.  Psychiatric: She has a normal mood and affect. Her speech is normal and behavior is normal. Thought content normal.  Vitals reviewed.      Assessment & Plan:   Problem List Items Addressed This Visit      Respiratory   Asthma    Pleased to see  patient is feeling better today after emergency room visit yesterday. SaO2 95%. She is in no acute respiratory distress this morning. We discussed continuing the prednisone as well as inhalers. She will continue to follow up with pulmonology. She will also let us know if she  does not continue to get better.           I am having Ms. Belcastro maintain her MULTI-VITAMINS, montelukast, AEROCHAMBER PLUS, omeprazole, fluticasone, albuterol, budesonide-formoterol, albuterol, chlorpheniramine-HYDROcodone, and methylPREDNISolone.   No orders of the defined types were placed in this encounter.   Return precautions given.   Risks, benefits, and alternatives of the medications and treatment plan prescribed today were discussed, and patient expressed understanding.   Education regarding symptom management and diagnosis given to patient on AVS.  Continue to follow with Crecencio Mc, MD for routine health maintenance.   Molly Stone and I agreed with plan.   Mable Paris, FNP

## 2017-03-27 NOTE — Progress Notes (Signed)
Pre visit review using our clinic review tool, if applicable. No additional management support is needed unless otherwise documented below in the visit note. 

## 2017-03-27 NOTE — Patient Instructions (Addendum)
Glad you are feeling better  Let me know if you don't continue to feel better. [id number]\\\\\\\\\\\\\\\\\\\

## 2017-04-02 ENCOUNTER — Encounter: Payer: Self-pay | Admitting: Family

## 2017-04-03 ENCOUNTER — Other Ambulatory Visit: Payer: Self-pay | Admitting: Family

## 2017-04-03 DIAGNOSIS — J029 Acute pharyngitis, unspecified: Secondary | ICD-10-CM

## 2017-04-03 MED ORDER — AMOXICILLIN 500 MG PO CAPS: 500.0000 mg | ORAL_CAPSULE | Freq: Two times a day (BID) | ORAL | 0 refills | Status: DC

## 2017-04-03 NOTE — Progress Notes (Signed)
amox

## 2017-04-04 ENCOUNTER — Encounter: Payer: Self-pay | Admitting: Family

## 2017-04-14 ENCOUNTER — Encounter: Payer: Self-pay | Admitting: Internal Medicine

## 2017-04-17 DIAGNOSIS — G4733 Obstructive sleep apnea (adult) (pediatric): Secondary | ICD-10-CM | POA: Diagnosis not present

## 2017-05-07 ENCOUNTER — Other Ambulatory Visit: Payer: Self-pay | Admitting: Obstetrics and Gynecology

## 2017-05-07 ENCOUNTER — Encounter: Payer: Self-pay | Admitting: Obstetrics and Gynecology

## 2017-05-07 ENCOUNTER — Ambulatory Visit (INDEPENDENT_AMBULATORY_CARE_PROVIDER_SITE_OTHER): Payer: Commercial Managed Care - PPO | Admitting: Obstetrics and Gynecology

## 2017-05-07 VITALS — BP 135/76 | HR 106 | Ht 68.0 in | Wt 297.7 lb

## 2017-05-07 DIAGNOSIS — N946 Dysmenorrhea, unspecified: Secondary | ICD-10-CM | POA: Diagnosis not present

## 2017-05-07 DIAGNOSIS — Z01419 Encounter for gynecological examination (general) (routine) without abnormal findings: Secondary | ICD-10-CM | POA: Diagnosis not present

## 2017-05-07 DIAGNOSIS — Z842 Family history of other diseases of the genitourinary system: Secondary | ICD-10-CM

## 2017-05-07 DIAGNOSIS — R300 Dysuria: Secondary | ICD-10-CM

## 2017-05-07 LAB — POCT URINALYSIS DIPSTICK
BILIRUBIN UA: NEGATIVE
Glucose, UA: NEGATIVE
KETONES UA: NEGATIVE
Leukocytes, UA: NEGATIVE
Nitrite, UA: NEGATIVE
PH UA: 6 (ref 5.0–8.0)
PROTEIN UA: NEGATIVE
SPEC GRAV UA: 1.02 (ref 1.010–1.025)
Urobilinogen, UA: 0.2 E.U./dL

## 2017-05-07 NOTE — Progress Notes (Signed)
Patient ID: Molly Stone, female   DOB: 1980/10/30, 36 y.o.   MRN: [id number] ANNUAL PREVENTATIVE CARE GYN  ENCOUNTER NOTE  Subjective:       Molly Stone is a 36 y.o. [id number] female here for a routine annual gynecologic exam.  Current complaints: 1. Difficulty with weight loss 2. History of endometriosis  Patient currently is experiencing a challenge with weight loss efforts. She has seen bariatric medicine and may have stated she is not a candidate for weight loss surgery. They sent her to a nutritionist, and she feels that she is unable to comply with the type of dietary modification they are recommending for weight loss as it was too expensive. Patient reports being very active and has been achieving at least 12,000 steps a day in activity monitoring. She reports eating healthy. Thyroid studies have been normal.  The patient is reporting regular menses which are heavy and crampy. She is taking nonsteroidal anti-inflammatory medication on occasion for this.   Gynecologic History Patient's last menstrual period was 05/01/2017 (exact date). Contraception: vasectomy Last Pap: 09/2013 asucs/neg. Results were: abnormal Last mammogram: n/a. Results were: n/a  Obstetric History OB History  Gravida Para Term Preterm AB Living  6 3 3   3 2   SAB TAB Ectopic Multiple Live Births  2 1     2     # Outcome Date GA Lbr Len/2nd Weight Sex Delivery Anes PTL Lv  6 Term 2007   6 lb 14.4 oz (3.13 kg) F Vag-Spont   LIV  5 Term 2006   6 lb 1.6 oz (2.767 kg) M Vag-Spont     4 Term 2004   6 lb 14.4 oz (3.13 kg) F Vag-Spont   LIV  3 SAB           2 SAB           1 TAB               Past Medical History:  Diagnosis Date  . Allergy   . Anemia   . Asthma   . Chlamydia    h/o  . Disorder of vocal cord    spasmodic dysphonia  . Dysmenorrhea   . Dyspareunia, female   . Endometriosis   . Family history of endometriosis   . GERD (gastroesophageal reflux disease)   . Headache    migraine   . Heavy periods   . History of nephrolithiasis   . Hypertension   . Jaundice, physiologic, newborn   . Obesity (BMI 30-39.9)   . Tobacco user   . Vaginal Pap smear, abnormal     Past Surgical History:  Procedure Laterality Date  . DILATION AND CURETTAGE OF UTERUS    . laparoscopy    . WISDOM TOOTH EXTRACTION      Current Outpatient Prescriptions on File Prior to Visit  Medication Sig Dispense Refill  . albuterol (PROVENTIL HFA;VENTOLIN HFA) 108 (90 Base) MCG/ACT inhaler Inhale 2 puffs into the lungs every 6 (six) hours as needed for wheezing or shortness of breath. 18 g 3  . budesonide-formoterol (SYMBICORT) 160-4.5 MCG/ACT inhaler Inhale 2 puffs into the lungs 2 (two) times daily. 1 Inhaler 5  . fluticasone (FLONASE) 50 MCG/ACT nasal spray Place 1 spray into both nostrils daily.    . montelukast (SINGULAIR) 10 MG tablet Take 1 tablet (10 mg total) by mouth at bedtime. 30 tablet 3  . Multiple Vitamin (MULTI-VITAMINS) TABS Take by mouth.    Marland Kitchen omeprazole (PRILOSEC) 20  MG capsule Take 1 capsule (20 mg total) by mouth daily. 30 capsule 5   Current Facility-Administered Medications on File Prior to Visit  Medication Dose Route Frequency Provider Last Rate Last Dose  . methacholine (PROVOCHOLINE) inhaler solution 8 mg  2 mL Inhalation Once Crecencio Mc, MD       Followed by  . methacholine (PROVOCHOLINE) inhaler solution 32 mg  2 mL Inhalation Once Crecencio Mc, MD       Followed by  . albuterol (PROVENTIL) (2.5 MG/3ML) 0.083% nebulizer solution 2.5 mg  2.5 mg Nebulization Once Crecencio Mc, MD        Allergies  Allergen Reactions  . Morphine And Related Itching    And shaking  . Buprenorphine Hcl Itching    And shaking  . Prednisone Anxiety    Social History   Social History  . Marital status: Married    Spouse name: N/A  . Number of children: N/A  . Years of education: N/A   Occupational History  . Not on file.   Social History Main Topics  . Smoking  status: Current Some Day Smoker    Types: Cigarettes    Last attempt to quit: 02/15/2014  . Smokeless tobacco: Never Used  . Alcohol use No     Comment: wine occassionally  . Drug use: No  . Sexual activity: Yes     Comment: vasectomy   Other Topics Concern  . Not on file   Social History Narrative  . No narrative on file    Family History  Problem Relation Age of Onset  . Endometriosis Mother   . Cancer Father        benign liver CA  . Stroke Father        recurrent blood clots on the brain   . Cancer Maternal Grandmother 30       ovarian Ca, still surviving   . Osteoporosis Maternal Grandmother   . Heart disease Maternal Grandfather   . Cancer Maternal Grandfather 66       colon CA. metastatic  . Diabetes Maternal Uncle     The following portions of the patient's history were reviewed and updated as appropriate: allergies, current medications, past family history, past medical history, past social history, past surgical history and problem list.  Review of Systems Review of Systems  Constitutional: Negative.        Difficulty with weight loss; frustrated with attempts at losing weight  HENT: Negative.   Eyes: Negative.   Respiratory: Negative.   Cardiovascular: Negative.   Gastrointestinal: Negative.   Genitourinary:       Menstrual cycles heavy and painful; uses ibuprofen for relief  Musculoskeletal: Negative.   Skin: Negative.   Neurological: Negative.   Endo/Heme/Allergies: Negative.   Psychiatric/Behavioral: Negative.       Objective:   BP 135/76   Pulse (!) 106   Ht 5\' 8"  (1.727 m)   Wt 297 lb 11.2 oz (135 kg)   LMP 05/01/2017 (Exact Date)   BMI 45.27 kg/m  CONSTITUTIONAL: Well-developed, well-nourished female in no acute distress.  PSYCHIATRIC: Normal mood and affect. Normal behavior. Normal judgment and thought content. [place]: Alert and oriented to person, place, and time. Normal muscle tone coordination. No cranial nerve deficit  noted. HENT:  Normocephalic, atraumatic EYES: Conjunctivae and EOM are normal. NECK: Normal range of motion, supple, no masses.  Normal thyroid.  SKIN: Skin is warm and dry. No rash noted. Not diaphoretic. No erythema. No  pallor. CARDIOVASCULAR: Normal heart rate noted, regular rhythm, no murmur. RESPIRATORY: Clear to auscultation bilaterally. Effort and breath sounds normal, no problems with respiration noted. BREASTS: Symmetric in size. No masses, skin changes, nipple drainage, or lymphadenopathy. ABDOMEN: Soft, normal bowel sounds, no distention noted.  No tenderness, rebound or guarding.  BLADDER: Normal PELVIC:  External Genitalia: Normal  BUS: Normal  Vagina: Normal  Cervix: minimal cervical motion tenderness; no lesions  Uterus: Normal; midplane, normal size and shape, mobile, mildly tender  Adnexa: Normal; nonpalpable and nontender  RV: External Exam NormaI  MUSCULOSKELETAL: Normal range of motion. No tenderness.  No cyanosis, clubbing, or edema.  2+ distal pulses. LYMPHATIC: No Axillary, Supraclavicular, or Inguinal Adenopathy.    Assessment:   Annual gynecologic examination 36 y.o. Contraception: vasectomy bmi- 45 Difficulty with weight loss History of endometriosis, stable  Plan:  Pap: pap/hpv Mammogram: Not Indicated Stool Guaiac Testing:  Not Indicated Labs: thru pcp Routine preventative health maintenance measures emphasized: Exercise/Diet/Weight control, Tobacco Warnings and Alcohol/Substance use risks  Consider Mirena IUD for management of menorrhagia and dysmenorrhea Return to [place], [place]  Brayton Mars, MD   Note: This dictation was prepared with Dragon dictation along with smaller phrase technology. Any transcriptional errors that result from this process are unintentional.

## 2017-05-07 NOTE — Patient Instructions (Addendum)
1. Pap smear is done 2. Self breast exam encouraged 3. Continue with healthy eating and exercise with attempts at controlled weight loss. 4. Screening labs are to be obtained for primary care 5. Return in 1 year for annual exam 6. Notify us if you would like to have the Mirena IUD inserted for management of heavy bleeding and painful menses  Levonorgestrel intrauterine device (IUD) What is this medicine? LEVONORGESTREL IUD (LEE voe nor jes trel) is a contraceptive (birth control) device. The device is placed inside the uterus by a healthcare professional. It is used to prevent pregnancy. This device can also be used to treat heavy bleeding that occurs during your period. This medicine may be used for other purposes; ask your health care provider or pharmacist if you have questions. COMMON BRAND NAME(S): Minette Headland What should I tell my health care provider before I take this medicine? They need to know if you have any of these conditions: -abnormal Pap smear -cancer of the breast, uterus, or cervix -diabetes -endometritis -genital or pelvic infection now or in the past -have more than one sexual partner or your partner has more than one partner -heart disease -history of an ectopic or tubal pregnancy -immune system problems -IUD in place -liver disease or tumor -problems with blood clots or take blood-thinners -seizures -use intravenous drugs -uterus of unusual shape -vaginal bleeding that has not been explained -an unusual or allergic reaction to levonorgestrel, other hormones, silicone, or polyethylene, medicines, foods, dyes, or preservatives -pregnant or trying to get pregnant -breast-feeding How should I use this medicine? This device is placed inside the uterus by a health care professional. Talk to your pediatrician regarding the use of this medicine in children. Special care may be needed. Overdosage: If you think you have taken too much of this  medicine contact a poison control center or emergency room at once. NOTE: This medicine is only for you. Do not share this medicine with others. What if I miss a dose? This does not apply. Depending on the brand of device you have inserted, the device will need to be replaced every 3 to 5 years if you wish to continue using this type of birth control. What may interact with this medicine? Do not take this medicine with any of the following medications: -amprenavir -bosentan -fosamprenavir This medicine may also interact with the following medications: -aprepitant -armodafinil -barbiturate medicines for inducing sleep or treating seizures -bexarotene -boceprevir -griseofulvin -medicines to treat seizures like carbamazepine, ethotoin, felbamate, oxcarbazepine, phenytoin, topiramate -modafinil -pioglitazone -rifabutin -rifampin -rifapentine -some medicines to treat HIV infection like atazanavir, efavirenz, indinavir, lopinavir, nelfinavir, tipranavir, ritonavir -St. John's wort -warfarin This list may not describe all possible interactions. Give your health care provider a list of all the medicines, herbs, non-prescription drugs, or dietary supplements you use. Also tell them if you smoke, drink alcohol, or use illegal drugs. Some items may interact with your medicine. What should I watch for while using this medicine? Visit your doctor or health care professional for regular check ups. See your doctor if you or your partner has sexual contact with others, becomes HIV positive, or gets a sexual transmitted disease. This product does not protect you against HIV infection (AIDS) or other sexually transmitted diseases. You can check the placement of the IUD yourself by reaching up to the top of your vagina with clean fingers to feel the threads. Do not pull on the threads. It is a good habit to check placement after each  menstrual period. Call your doctor right away if you feel more of the IUD  than just the threads or if you cannot feel the threads at all. The IUD may come out by itself. You may become pregnant if the device comes out. If you notice that the IUD has come out use a backup birth control method like condoms and call your health care provider. Using tampons will not change the position of the IUD and are okay to use during your period. This IUD can be safely scanned with magnetic resonance imaging (MRI) only under specific conditions. Before you have an MRI, tell your healthcare provider that you have an IUD in place, and which type of IUD you have in place. What side effects may I notice from receiving this medicine? Side effects that you should report to your doctor or health care professional as soon as possible: -allergic reactions like skin rash, itching or hives, swelling of the face, lips, or tongue -fever, flu-like symptoms -genital sores -high blood pressure -no menstrual period for 6 weeks during use -pain, swelling, warmth in the leg -pelvic pain or tenderness -severe or sudden headache -signs of pregnancy -stomach cramping -sudden shortness of breath -trouble with balance, talking, or walking -unusual vaginal bleeding, discharge -yellowing of the eyes or skin Side effects that usually do not require medical attention (report to your doctor or health care professional if they continue or are bothersome): -acne -breast pain -change in sex drive or performance -changes in weight -cramping, dizziness, or faintness while the device is being inserted -headache -irregular menstrual bleeding within first 3 to 6 months of use -nausea This list may not describe all possible side effects. Call your doctor for medical advice about side effects. You may report side effects to FDA at 1-800-FDA-1088. Where should I keep my medicine? This does not apply. NOTE: This sheet is a summary. It may not cover all possible information. If you have questions about this  medicine, talk to your doctor, pharmacist, or health care provider.  2018 Elsevier/Gold Standard (2016-07-19 14:14:56) Health Maintenance, Female Adopting a healthy lifestyle and getting preventive care can go a long way to promote health and wellness. Talk with your health care provider about what schedule of regular examinations is right for you. This is a good chance for you to check in with your provider about disease prevention and staying healthy. In between checkups, there are plenty of things you can do on your own. Experts have done a lot of research about which lifestyle changes and preventive measures are most likely to keep you healthy. Ask your health care provider for more information. Weight and diet Eat a healthy diet  Be sure to include plenty of vegetables, fruits, low-fat dairy products, and lean protein.  Do not eat a lot of foods high in solid fats, added sugars, or salt.  Get regular exercise. This is one of the most important things you can do for your health. ? Most adults should exercise for at least 150 minutes each week. The exercise should increase your heart rate and make you sweat (moderate-intensity exercise). ? Most adults should also do strengthening exercises at least twice a week. This is in addition to the moderate-intensity exercise.  Maintain a healthy weight  Body mass index (BMI) is a measurement that can be used to identify possible weight problems. It estimates body fat based on height and weight. Your health care provider can help determine your BMI and help you achieve or maintain  a healthy weight.  For females 30 years of age and older: ? A BMI below 18.5 is considered underweight. ? A BMI of 18.5 to 24.9 is normal. ? A BMI of 25 to 29.9 is considered overweight. ? A BMI of 30 and above is considered obese.  Watch levels of cholesterol and blood lipids  You should start having your blood tested for lipids and cholesterol at 35 years of age,  then have this test every 5 years.  You may need to have your cholesterol levels checked more often if: ? Your lipid or cholesterol levels are high. ? You are older than 35 years of age. ? You are at high risk for heart disease.  Cancer screening Lung Cancer  Lung cancer screening is recommended for adults 35-73 years old who are at high risk for lung cancer because of a history of smoking.  A yearly low-dose CT scan of the lungs is recommended for people who: ? Currently smoke. ? Have quit within the past 15 years. ? Have at least a 30-pack-year history of smoking. A pack year is smoking an average of one pack of cigarettes a day for 1 year.  Yearly screening should continue until it has been 15 years since you quit.  Yearly screening should stop if you develop a health problem that would prevent you from having lung cancer treatment.  Breast Cancer  Practice breast self-awareness. This means understanding how your breasts normally appear and feel.  It also means doing regular breast self-exams. Let your health care provider know about any changes, no matter how small.  If you are in your 20s or 30s, you should have a clinical breast exam (CBE) by a health care provider every 1-3 years as part of a regular health exam.  If you are 54 or older, have a CBE every year. Also consider having a breast X-ray (mammogram) every year.  If you have a family history of breast cancer, talk to your health care provider about genetic screening.  If you are at high risk for breast cancer, talk to your health care provider about having an MRI and a mammogram every year.  Breast cancer gene (BRCA) assessment is recommended for women who have family members with BRCA-related cancers. BRCA-related cancers include: ? Breast. ? Ovarian. ? Tubal. ? Peritoneal cancers.  Results of the assessment will determine the need for genetic counseling and BRCA1 and BRCA2 testing.  Cervical Cancer Your  health care provider may recommend that you be screened regularly for cancer of the pelvic organs (ovaries, uterus, and vagina). This screening involves a pelvic examination, including checking for microscopic changes to the surface of your cervix (Pap test). You may be encouraged to have this screening done every 3 years, beginning at age 19.  For women ages 57-65, health care providers may recommend pelvic exams and Pap testing every 3 years, or they may recommend the Pap and pelvic exam, combined with testing for human papilloma virus (HPV), every 5 years. Some types of HPV increase your risk of cervical cancer. Testing for HPV may also be done on women of any age with unclear Pap test results.  Other health care providers may not recommend any screening for nonpregnant women who are considered low risk for pelvic cancer and who do not have symptoms. Ask your health care provider if a screening pelvic exam is right for you.  If you have had past treatment for cervical cancer or a condition that could lead to  cancer, you need Pap tests and screening for cancer for at least 20 years after your treatment. If Pap tests have been discontinued, your risk factors (such as having a new sexual partner) need to be reassessed to determine if screening should resume. Some women have medical problems that increase the chance of getting cervical cancer. In these cases, your health care provider may recommend more frequent screening and Pap tests.  Colorectal Cancer  This type of cancer can be detected and often prevented.  Routine colorectal cancer screening usually begins at 35 years of age and continues through 35 years of age.  Your health care provider may recommend screening at an earlier age if you have risk factors for colon cancer.  Your health care provider may also recommend using home test kits to check for hidden blood in the stool.  A small camera at the end of a tube can be used to examine your  colon directly (sigmoidoscopy or colonoscopy). This is done to check for the earliest forms of colorectal cancer.  Routine screening usually begins at age 18.  Direct examination of the colon should be repeated every 5-10 years through 35 years of age. However, you may need to be screened more often if early forms of precancerous polyps or small growths are found.  Skin Cancer  Check your skin from head to toe regularly.  Tell your health care provider about any new moles or changes in moles, especially if there is a change in a mole's shape or color.  Also tell your health care provider if you have a mole that is larger than the size of a pencil eraser.  Always use sunscreen. Apply sunscreen liberally and repeatedly throughout the day.  Protect yourself by wearing long sleeves, pants, a wide-brimmed hat, and sunglasses whenever you are outside.  Heart disease, diabetes, and high blood pressure  High blood pressure causes heart disease and increases the risk of stroke. High blood pressure is more likely to develop in: ? People who have blood pressure in the high end of the normal range (130-139/85-89 mm Hg). ? People who are overweight or obese. ? People who are African American.  If you are 64-4 years of age, have your blood pressure checked every 3-5 years. If you are 74 years of age or older, have your blood pressure checked every year. You should have your blood pressure measured twice-once when you are at a hospital or clinic, and once when you are not at a hospital or clinic. Record the average of the two measurements. To check your blood pressure when you are not at a hospital or clinic, you can use: ? An automated blood pressure machine at a pharmacy. ? A home blood pressure monitor.  If you are between 50 years and 34 years old, ask your health care provider if you should take aspirin to prevent strokes.  Have regular diabetes screenings. This involves taking a blood sample  to check your fasting blood sugar level. ? If you are at a normal weight and have a low risk for diabetes, have this test once every three years after 35 years of age. ? If you are overweight and have a high risk for diabetes, consider being tested at a younger age or more often. Preventing infection Hepatitis B  If you have a higher risk for hepatitis B, you should be screened for this virus. You are considered at high risk for hepatitis B if: ? You were born in a country  where hepatitis B is common. Ask your health care provider which countries are considered high risk. ? Your parents were born in a high-risk country, and you have not been immunized against hepatitis B (hepatitis B vaccine). ? You have HIV or AIDS. ? You use needles to inject street drugs. ? You live with someone who has hepatitis B. ? You have had sex with someone who has hepatitis B. ? You get hemodialysis treatment. ? You take certain medicines for conditions, including cancer, organ transplantation, and autoimmune conditions.  Hepatitis C  Blood testing is recommended for: ? Everyone born from [birth year] through 1964. ? Anyone with known risk factors for hepatitis C.  Sexually transmitted infections (STIs)  You should be screened for sexually transmitted infections (STIs) including gonorrhea and chlamydia if: ? You are sexually active and are younger than 35 years of age. ? You are older than 35 years of age and your health care provider tells you that you are at risk for this type of infection. ? Your sexual activity has changed since you were last screened and you are at an increased risk for chlamydia or gonorrhea. Ask your health care provider if you are at risk.  If you do not have HIV, but are at risk, it may be recommended that you take a prescription medicine daily to prevent HIV infection. This is called pre-exposure prophylaxis (PrEP). You are considered at risk if: ? You are sexually active and do not  regularly use condoms or know the HIV status of your partner(s). ? You take drugs by injection. ? You are sexually active with a partner who has HIV.  Talk with your health care provider about whether you are at high risk of being infected with HIV. If you choose to begin PrEP, you should first be tested for HIV. You should then be tested every 3 months for as long as you are taking PrEP. Pregnancy  If you are premenopausal and you may become pregnant, ask your health care provider about preconception counseling.  If you may become pregnant, take 400 to 800 micrograms (mcg) of folic acid every day.  If you want to prevent pregnancy, talk to your health care provider about birth control (contraception). Osteoporosis and menopause  Osteoporosis is a disease in which the bones lose minerals and strength with aging. This can result in serious bone fractures. Your risk for osteoporosis can be identified using a bone density scan.  If you are 100 years of age or older, or if you are at risk for osteoporosis and fractures, ask your health care provider if you should be screened.  Ask your health care provider whether you should take a calcium or vitamin D supplement to lower your risk for osteoporosis.  Menopause may have certain physical symptoms and risks.  Hormone replacement therapy may reduce some of these symptoms and risks. Talk to your health care provider about whether hormone replacement therapy is right for you. Follow these instructions at home:  Schedule regular health, dental, and eye exams.  Stay current with your immunizations.  Do not use any tobacco products including cigarettes, chewing tobacco, or electronic cigarettes.  If you are pregnant, do not drink alcohol.  If you are breastfeeding, limit how much and how often you drink alcohol.  Limit alcohol intake to no more than 1 drink per day for nonpregnant women. One drink equals 12 ounces of beer, 5 ounces of wine, or  1 ounces of hard liquor.  Do not use  street drugs.  Do not share needles.  Ask your health care provider for help if you need support or information about quitting drugs.  Tell your health care provider if you often feel depressed.  Tell your health care provider if you have ever been abused or do not feel safe at home. This information is not intended to replace advice given to you by your health care provider. Make sure you discuss any questions you have with your health care provider. Document Released: 04/22/2011 Document Revised: 03/14/2016 Document Reviewed: 07/11/2015 Elsevier Interactive Patient Education  [employer].

## 2017-05-09 LAB — URINE CULTURE: ORGANISM ID, BACTERIA: NO GROWTH

## 2017-05-15 LAB — PAP IG AND HPV HIGH-RISK
HPV, high-risk: NEGATIVE
PAP Smear Comment: 0

## 2017-06-02 DIAGNOSIS — R200 Anesthesia of skin: Secondary | ICD-10-CM | POA: Diagnosis not present

## 2017-06-02 DIAGNOSIS — R202 Paresthesia of skin: Secondary | ICD-10-CM | POA: Diagnosis not present

## 2017-06-05 ENCOUNTER — Other Ambulatory Visit: Payer: Self-pay | Admitting: Neurology

## 2017-06-05 DIAGNOSIS — R202 Paresthesia of skin: Principal | ICD-10-CM

## 2017-06-05 DIAGNOSIS — R200 Anesthesia of skin: Secondary | ICD-10-CM

## 2017-06-09 ENCOUNTER — Ambulatory Visit: Payer: Commercial Managed Care - PPO

## 2017-06-11 ENCOUNTER — Ambulatory Visit (INDEPENDENT_AMBULATORY_CARE_PROVIDER_SITE_OTHER): Payer: Commercial Managed Care - PPO | Admitting: Family

## 2017-06-11 ENCOUNTER — Ambulatory Visit: Payer: Self-pay | Admitting: Obstetrics and Gynecology

## 2017-06-11 ENCOUNTER — Encounter: Payer: Self-pay | Admitting: Family

## 2017-06-11 VITALS — BP 110/78 | HR 115 | Temp 97.9°F | Resp 12 | Wt 287.5 lb

## 2017-06-11 DIAGNOSIS — F411 Generalized anxiety disorder: Secondary | ICD-10-CM

## 2017-06-11 MED ORDER — BUSPIRONE HCL 7.5 MG PO TABS: 7.5000 mg | ORAL_TABLET | Freq: Two times a day (BID) | ORAL | 2 refills | Status: DC

## 2017-06-11 NOTE — Progress Notes (Signed)
Subjective:    Patient ID: Molly Stone, female    DOB: [birth year], 36 y.o.   MRN: [id number]  CC: [name] is a 36 y.o. female who presents today for an acute visit.    HPI: CC: anxiety x one month, worsening.  Health and 'doctors appointments ' have her more anxious. Home life going well. Having panic attacks. Used to be able to splash water on face and would feel better, and now coping mechanisms are not working.  Working hard to loose weight and frustrated by slow results.    Wakes up at night and cannot breath with cipap machine due to anxiety.  No thoughts of hurting herself or anyone else.   H/o depression when she was caring for grandmother; had been on lexapro at that time, which didn't work  No alcohol use.   HISTORY:  Past Medical History:  Diagnosis Date  . Allergy   . Anemia   . Asthma   . Chlamydia    h/o  . Disorder of vocal cord    spasmodic dysphonia  . Dysmenorrhea   . Dyspareunia, female   . Endometriosis   . Family history of endometriosis   . GERD (gastroesophageal reflux disease)   . Headache    migraine  . Heavy periods   . History of nephrolithiasis   . Hypertension   . Jaundice, physiologic, newborn   . Obesity (BMI 30-39.9)   . Tobacco user   . Vaginal Pap smear, abnormal    Past Surgical History:  Procedure Laterality Date  . DILATION AND CURETTAGE OF UTERUS    . laparoscopy    . WISDOM TOOTH EXTRACTION     Family History  Problem Relation Age of Onset  . Endometriosis Mother   . Cancer Father        benign liver CA  . Stroke Father        recurrent blood clots on the brain   . Cancer Maternal Grandmother 30       ovarian Ca, still surviving   . Osteoporosis Maternal Grandmother   . Ovarian cancer Maternal Grandmother   . Heart disease Maternal Grandfather   . Cancer Maternal Grandfather 15       colon CA. metastatic  . Diabetes Maternal Uncle   . Heart disease Paternal Grandmother   . Breast cancer Neg Hx   .  Colon cancer Neg Hx     Allergies: Morphine and related; Tramadol hcl; Buprenorphine hcl; and Prednisone Current Outpatient Prescriptions on File Prior to Visit  Medication Sig Dispense Refill  . albuterol (PROVENTIL HFA;VENTOLIN HFA) 108 (90 Base) MCG/ACT inhaler Inhale 2 puffs into the lungs every 6 (six) hours as needed for wheezing or shortness of breath. 18 g 3  . budesonide-formoterol (SYMBICORT) 160-4.5 MCG/ACT inhaler Inhale 2 puffs into the lungs 2 (two) times daily. 1 Inhaler 5  . fluticasone (FLONASE) 50 MCG/ACT nasal spray Place 1 spray into both nostrils daily.    . montelukast (SINGULAIR) 10 MG tablet Take 1 tablet (10 mg total) by mouth at bedtime. 30 tablet 3  . Multiple Vitamin (MULTI-VITAMINS) TABS Take by mouth.    Marland Kitchen omeprazole (PRILOSEC) 20 MG capsule Take 1 capsule (20 mg total) by mouth daily. 30 capsule 5   Current Facility-Administered Medications on File Prior to Visit  Medication Dose Route Frequency Provider Last Rate Last Dose  . methacholine (PROVOCHOLINE) inhaler solution 8 mg  2 mL Inhalation Once Crecencio Mc, MD  Followed by  . methacholine (PROVOCHOLINE) inhaler solution 32 mg  2 mL Inhalation Once Crecencio Mc, MD       Followed by  . albuterol (PROVENTIL) (2.5 MG/3ML) 0.083% nebulizer solution 2.5 mg  2.5 mg Nebulization Once Crecencio Mc, MD        Social History  Substance Use Topics  . Smoking status: Former Smoker    Types: Cigarettes    Quit date: 10/2016  . Smokeless tobacco: Never Used  . Alcohol use No     Comment: wine occassionally    Review of Systems  Constitutional: Negative for chills and fever.  Respiratory: Negative for cough.   Cardiovascular: Negative for chest pain and palpitations.  Gastrointestinal: Negative for nausea and vomiting.  Psychiatric/Behavioral: Positive for sleep disturbance. Negative for suicidal ideas. The patient is nervous/anxious.       Objective:    BP 110/78 (BP Location: Left Arm,  Patient Position: Sitting, Cuff Size: Large)   Pulse (!) 115   Temp 97.9 F (36.6 C) (Oral)   Resp 12   Wt 287 lb 8 oz (130.4 kg)   SpO2 96%   BMI 43.71 kg/m    Physical Exam  Constitutional: She appears well-developed and well-nourished.  Eyes: Conjunctivae are normal.  Cardiovascular: Normal rate, regular rhythm, normal heart sounds and normal pulses.   Pulmonary/Chest: Effort normal and breath sounds normal. She has no wheezes. She has no rhonchi. She has no rales.  Neurological: She is alert.  Skin: Skin is warm and dry.  Psychiatric: She has a normal mood and affect. Her speech is normal and behavior is normal. Thought content normal.  Vitals reviewed.      Assessment & Plan:   Problem List Items Addressed This Visit      Other   Generalized anxiety disorder - Primary    Worsening. Will trial buspar. Follow up 6 weeks, sooner if needed.       Relevant Medications   busPIRone (BUSPAR) 7.5 MG tablet         I am having Ms. Scharnhorst maintain her MULTI-VITAMINS, montelukast, omeprazole, fluticasone, albuterol, budesonide-formoterol, and gabapentin.   Meds ordered this encounter  Medications  . gabapentin (NEURONTIN) 300 MG capsule    Sig: Take 1 tab twice a day for a week then increase to 1 tab three times a day,    Return precautions given.   Risks, benefits, and alternatives of the medications and treatment plan prescribed today were discussed, and patient expressed understanding.   Education regarding symptom management and diagnosis given to patient on AVS.  Continue to follow with Crecencio Mc, MD for routine health maintenance.   Molly Stone and I agreed with plan.   Mable Paris, FNP  I spent 25 min face to face w/ pt. Of which greater than 50% of time was spent counseling regarding anxiety and treatment options.

## 2017-06-11 NOTE — Patient Instructions (Signed)
Trial of buspar  Follow up in couple weeks and let me know how you are doing

## 2017-06-12 NOTE — Assessment & Plan Note (Addendum)
Worsening. Will trial buspar. Declines referral to counseling.Follow up 6 weeks, sooner if needed.

## 2017-06-17 DIAGNOSIS — R202 Paresthesia of skin: Secondary | ICD-10-CM | POA: Diagnosis not present

## 2017-06-17 DIAGNOSIS — R200 Anesthesia of skin: Secondary | ICD-10-CM | POA: Diagnosis not present

## 2017-06-25 ENCOUNTER — Ambulatory Visit (INDEPENDENT_AMBULATORY_CARE_PROVIDER_SITE_OTHER): Payer: Commercial Managed Care - PPO | Admitting: Family

## 2017-06-25 ENCOUNTER — Encounter: Payer: Self-pay | Admitting: Family

## 2017-06-25 DIAGNOSIS — F411 Generalized anxiety disorder: Secondary | ICD-10-CM | POA: Diagnosis not present

## 2017-06-25 DIAGNOSIS — Z23 Encounter for immunization: Secondary | ICD-10-CM | POA: Diagnosis not present

## 2017-06-25 NOTE — Progress Notes (Signed)
Subjective:    Patient ID: Molly Stone, female    DOB: 1981/08/28, 36 y.o.   MRN: [id number]  CC: Molly Stone is a 36 y.o. female who presents today for follow up.   HPI: Doing well on buspar. Anxiety is better controlled. No panic attacks. Sleeping well and feels like her 'old self.'  No thoughts of hurting herself or anyone else.     HISTORY:  Past Medical History:  Diagnosis Date  . Allergy   . Anemia   . Asthma   . Chlamydia    h/o  . Disorder of vocal cord    spasmodic dysphonia  . Dysmenorrhea   . Dyspareunia, female   . Endometriosis   . Family history of endometriosis   . GERD (gastroesophageal reflux disease)   . Headache    migraine  . Heavy periods   . History of nephrolithiasis   . Hypertension   . Jaundice, physiologic, newborn   . Obesity (BMI 30-39.9)   . Tobacco user   . Vaginal Pap smear, abnormal    Past Surgical History:  Procedure Laterality Date  . DILATION AND CURETTAGE OF UTERUS    . laparoscopy    . WISDOM TOOTH EXTRACTION     Family History  Problem Relation Age of Onset  . Endometriosis Mother   . Cancer Father        benign liver CA  . Stroke Father        recurrent blood clots on the brain   . Cancer Maternal Grandmother 30       ovarian Ca, still surviving   . Osteoporosis Maternal Grandmother   . Ovarian cancer Maternal Grandmother   . Heart disease Maternal Grandfather   . Cancer Maternal Grandfather 5       colon CA. metastatic  . Diabetes Maternal Uncle   . Heart disease Paternal Grandmother   . Breast cancer Neg Hx   . Colon cancer Neg Hx     Allergies: Morphine and related; Tramadol hcl; Buprenorphine hcl; and Prednisone Current Outpatient Prescriptions on File Prior to Visit  Medication Sig Dispense Refill  . albuterol (PROVENTIL HFA;VENTOLIN HFA) 108 (90 Base) MCG/ACT inhaler Inhale 2 puffs into the lungs every 6 (six) hours as needed for wheezing or shortness of breath. 18 g 3  .  budesonide-formoterol (SYMBICORT) 160-4.5 MCG/ACT inhaler Inhale 2 puffs into the lungs 2 (two) times daily. 1 Inhaler 5  . busPIRone (BUSPAR) 7.5 MG tablet Take 1 tablet (7.5 mg total) by mouth 2 (two) times daily. 60 tablet 2  . fluticasone (FLONASE) 50 MCG/ACT nasal spray Place 1 spray into both nostrils daily.    Marland Kitchen gabapentin (NEURONTIN) 300 MG capsule Take 1 tab twice a day for a week then increase to 1 tab three times a day,    . montelukast (SINGULAIR) 10 MG tablet Take 1 tablet (10 mg total) by mouth at bedtime. 30 tablet 3  . Multiple Vitamin (MULTI-VITAMINS) TABS Take by mouth.    Marland Kitchen omeprazole (PRILOSEC) 20 MG capsule Take 1 capsule (20 mg total) by mouth daily. 30 capsule 5   Current Facility-Administered Medications on File Prior to Visit  Medication Dose Route Frequency Provider Last Rate Last Dose  . methacholine (PROVOCHOLINE) inhaler solution 8 mg  2 mL Inhalation Once Crecencio Mc, MD       Followed by  . methacholine (PROVOCHOLINE) inhaler solution 32 mg  2 mL Inhalation Once Crecencio Mc, MD  Followed by  . albuterol (PROVENTIL) (2.5 MG/3ML) 0.083% nebulizer solution 2.5 mg  2.5 mg Nebulization Once Crecencio Mc, MD        Social History  Substance Use Topics  . Smoking status: Former Smoker    Types: Cigarettes    Quit date: 10/2016  . Smokeless tobacco: Never Used  . Alcohol use No     Comment: wine occassionally    Review of Systems  Constitutional: Negative for chills and fever.  Respiratory: Negative for cough.   Cardiovascular: Negative for chest pain and palpitations.  Gastrointestinal: Negative for nausea and vomiting.  Psychiatric/Behavioral: The patient is nervous/anxious.       Objective:    BP 124/72   Pulse 93   Temp 99 F (37.2 C) (Oral)   Resp 16   Wt 284 lb 6.4 oz (129 kg)   SpO2 97%   BMI 43.24 kg/m  BP Readings from Last 3 Encounters:  06/25/17 124/72  06/11/17 110/78  05/07/17 135/76   Wt Readings from Last 3  Encounters:  06/25/17 284 lb 6.4 oz (129 kg)  06/11/17 287 lb 8 oz (130.4 kg)  05/07/17 297 lb 11.2 oz (135 kg)    Physical Exam  Constitutional: She appears well-developed and well-nourished.  Eyes: Conjunctivae are normal.  Cardiovascular: Normal rate, regular rhythm, normal heart sounds and normal pulses.   Pulmonary/Chest: Effort normal and breath sounds normal. She has no wheezes. She has no rhonchi. She has no rales.  Neurological: She is alert.  Skin: Skin is warm and dry.  Psychiatric: She has a normal mood and affect. Her speech is normal and behavior is normal. Thought content normal.  Vitals reviewed.      Assessment & Plan:   Problem List Items Addressed This Visit      Other   Generalized anxiety disorder    Pleased to see that much Improved. Will continue current regimen on BuSpar.       Other Visit Diagnoses    Need for immunization against influenza       Relevant Orders   Flu Vaccine QUAD 36+ mos IM (Completed)       I am having Ms. Traeger maintain her MULTI-VITAMINS, montelukast, omeprazole, fluticasone, albuterol, budesonide-formoterol, gabapentin, and busPIRone.   No orders of the defined types were placed in this encounter.   Return precautions given.   Risks, benefits, and alternatives of the medications and treatment plan prescribed today were discussed, and patient expressed understanding.   Education regarding symptom management and diagnosis given to patient on AVS.  Continue to follow with Crecencio Mc, MD for routine health maintenance.   Molly Stone and I agreed with plan.   Mable Paris, FNP

## 2017-06-25 NOTE — Patient Instructions (Signed)
Glad you are doing well!   Let me now if you need anything.

## 2017-06-25 NOTE — Assessment & Plan Note (Addendum)
Pleased to see that much Improved. Will continue current regimen on BuSpar.

## 2017-06-26 ENCOUNTER — Encounter: Payer: Self-pay | Admitting: Family

## 2017-07-07 ENCOUNTER — Ambulatory Visit: Payer: Commercial Managed Care - PPO | Admitting: Internal Medicine

## 2017-07-07 DIAGNOSIS — R200 Anesthesia of skin: Secondary | ICD-10-CM | POA: Diagnosis not present

## 2017-07-07 DIAGNOSIS — R202 Paresthesia of skin: Secondary | ICD-10-CM | POA: Diagnosis not present

## 2017-07-25 ENCOUNTER — Ambulatory Visit (INDEPENDENT_AMBULATORY_CARE_PROVIDER_SITE_OTHER): Payer: Commercial Managed Care - PPO | Admitting: Internal Medicine

## 2017-07-25 ENCOUNTER — Encounter: Payer: Self-pay | Admitting: Internal Medicine

## 2017-07-25 DIAGNOSIS — G44229 Chronic tension-type headache, not intractable: Secondary | ICD-10-CM

## 2017-07-25 DIAGNOSIS — S134XXD Sprain of ligaments of cervical spine, subsequent encounter: Secondary | ICD-10-CM | POA: Diagnosis not present

## 2017-07-25 DIAGNOSIS — Z6841 Body Mass Index (BMI) 40.0 and over, adult: Secondary | ICD-10-CM

## 2017-07-25 MED ORDER — AMITRIPTYLINE HCL 25 MG PO TABS: 25.0000 mg | ORAL_TABLET | Freq: Every day | ORAL | 1 refills | Status: DC

## 2017-07-25 MED ORDER — FLUTICASONE PROPIONATE 50 MCG/ACT NA SUSP: 1.0000 | Freq: Every day | NASAL | 5 refills | Status: DC

## 2017-07-25 NOTE — Patient Instructions (Addendum)
I agree with your neurologist that a change from gabapentin to Elavil may help your headaches as well as your numbness.  Start with 25 mg daily in the evening one hour prior to bedtime    You can increase the dose to 50 mg daily . After a week if you do not feel any different.     Continue the KETO diet    I recommend Low Carb high Protein premixed Shake in the morning intsead of fasting :   Premier Protein  Atkins Advantage Muscle Milk EAS AdvantEdge   All of these are available at [employer], [place],  [place]  And taste good    30 minutes of cardio every day (not walking !)
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
## 2017-07-27 ENCOUNTER — Emergency Department: Payer: Commercial Managed Care - PPO

## 2017-07-27 ENCOUNTER — Encounter: Payer: Self-pay | Admitting: Emergency Medicine

## 2017-07-27 ENCOUNTER — Emergency Department
Admission: EM | Admit: 2017-07-27 | Discharge: 2017-07-27 | Disposition: A | Discharge status: Home / Self Care | Payer: Commercial Managed Care - PPO | Attending: Student in an Organized Health Care Education/Training Program | Admitting: Student in an Organized Health Care Education/Training Program

## 2017-07-27 DIAGNOSIS — I10 Essential (primary) hypertension: Secondary | ICD-10-CM | POA: Diagnosis not present

## 2017-07-27 DIAGNOSIS — Z885 Allergy status to narcotic agent status: Secondary | ICD-10-CM | POA: Diagnosis not present

## 2017-07-27 DIAGNOSIS — Z79899 Other long term (current) drug therapy: Secondary | ICD-10-CM | POA: Insufficient documentation

## 2017-07-27 DIAGNOSIS — N201 Calculus of ureter: Secondary | ICD-10-CM

## 2017-07-27 DIAGNOSIS — J45909 Unspecified asthma, uncomplicated: Secondary | ICD-10-CM | POA: Diagnosis not present

## 2017-07-27 DIAGNOSIS — R109 Unspecified abdominal pain: Secondary | ICD-10-CM

## 2017-07-27 DIAGNOSIS — Z87891 Personal history of nicotine dependence: Secondary | ICD-10-CM | POA: Diagnosis not present

## 2017-07-27 DIAGNOSIS — M549 Dorsalgia, unspecified: Secondary | ICD-10-CM | POA: Diagnosis not present

## 2017-07-27 DIAGNOSIS — Z87442 Personal history of urinary calculi: Secondary | ICD-10-CM | POA: Insufficient documentation

## 2017-07-27 DIAGNOSIS — N1330 Unspecified hydronephrosis: Secondary | ICD-10-CM | POA: Diagnosis not present

## 2017-07-27 LAB — CBC WITH DIFFERENTIAL/PLATELET
BASOS PCT: 1 %
Basophils Absolute: 0 10*3/uL (ref 0–0.1)
Eosinophils Absolute: 0.5 10*3/uL (ref 0–0.7)
Eosinophils Relative: 7 %
HEMATOCRIT: 43.1 % (ref 35.0–47.0)
HEMOGLOBIN: 14.8 g/dL (ref 12.0–16.0)
Lymphocytes Relative: 34 %
Lymphs Abs: 2.2 10*3/uL (ref 1.0–3.6)
MCH: 30.6 pg (ref 26.0–34.0)
MCHC: 34.3 g/dL (ref 32.0–36.0)
MCV: 89.3 fL (ref 80.0–100.0)
MONO ABS: 0.6 10*3/uL (ref 0.2–0.9)
MONOS PCT: 9 %
NEUTROS ABS: 3.1 10*3/uL (ref 1.4–6.5)
NEUTROS PCT: 49 %
Platelets: 168 10*3/uL (ref 150–440)
RBC: 4.83 MIL/uL (ref 3.80–5.20)
RDW: 13 % (ref 11.5–14.5)
WBC: 6.5 10*3/uL (ref 3.6–11.0)

## 2017-07-27 LAB — COMPREHENSIVE METABOLIC PANEL
ALBUMIN: 3.8 g/dL (ref 3.5–5.0)
ALK PHOS: 63 U/L (ref 38–126)
ALT: 24 U/L (ref 14–54)
AST: 23 U/L (ref 15–41)
Anion gap: 7 (ref 5–15)
BUN: 12 mg/dL (ref 6–20)
CALCIUM: 8.7 mg/dL — AB (ref 8.9–10.3)
CO2: 24 mmol/L (ref 22–32)
Chloride: 104 mmol/L (ref 101–111)
Creatinine, Ser: 0.81 mg/dL (ref 0.44–1.00)
GFR calc Af Amer: >60 mL/min (ref >60)
Glucose, Bld: 104 mg/dL — ABNORMAL HIGH (ref 65–99)
Potassium: 3.6 mmol/L (ref 3.5–5.1)
Sodium: 135 mmol/L (ref 135–145)
Total Bilirubin: 0.6 mg/dL (ref 0.3–1.2)
Total Protein: 7.9 g/dL (ref 6.5–8.1)

## 2017-07-27 LAB — URINALYSIS, COMPLETE (UACMP) WITH MICROSCOPIC
BACTERIA UA: NONE SEEN
Bilirubin Urine: NEGATIVE
Glucose, UA: NEGATIVE mg/dL
Hgb urine dipstick: NEGATIVE
KETONES UR: NEGATIVE mg/dL
Nitrite: NEGATIVE
PROTEIN: NEGATIVE mg/dL
Specific Gravity, Urine: 1.019 (ref 1.005–1.030)
pH: 6 (ref 5.0–8.0)

## 2017-07-27 LAB — POCT PREGNANCY, URINE: Preg Test, Ur: NEGATIVE

## 2017-07-27 MED ORDER — HYDROCODONE-ACETAMINOPHEN 5-325 MG PO TABS
1.0000 | ORAL_TABLET | Freq: Once | ORAL | Status: AC
  Administered 2017-07-27: 1 via ORAL
  Filled 2017-07-27: qty 1

## 2017-07-27 MED ORDER — SODIUM CHLORIDE 0.9 % IV BOLUS (SEPSIS)
1000.0000 mL | Freq: Once | INTRAVENOUS | Status: AC
  Administered 2017-07-27: 1000 mL via INTRAVENOUS

## 2017-07-27 MED ORDER — HYDROCODONE-ACETAMINOPHEN 5-325 MG PO TABS: 1.0000 | ORAL_TABLET | ORAL | 0 refills | Status: DC | PRN

## 2017-07-27 MED ORDER — PROMETHAZINE HCL 25 MG/ML IJ SOLN
12.5000 mg | Freq: Four times a day (QID) | INTRAMUSCULAR | Status: DC | PRN
  Administered 2017-07-27: 12.5 mg via INTRAVENOUS
  Filled 2017-07-27 (×2): qty 1

## 2017-07-27 MED ORDER — FENTANYL CITRATE (PF) 100 MCG/2ML IJ SOLN
100.0000 ug | INTRAMUSCULAR | Status: DC | PRN
  Administered 2017-07-27 (×2): 100 ug via INTRAVENOUS
  Filled 2017-07-27 (×3): qty 2

## 2017-07-27 MED ORDER — PROMETHAZINE HCL 12.5 MG PO TABS: 12.5000 mg | ORAL_TABLET | Freq: Four times a day (QID) | ORAL | 0 refills | Status: DC | PRN

## 2017-07-27 MED ORDER — KETOROLAC TROMETHAMINE 30 MG/ML IJ SOLN
15.0000 mg | Freq: Once | INTRAMUSCULAR | Status: AC
  Administered 2017-07-27: 15 mg via INTRAVENOUS
  Filled 2017-07-27: qty 1

## 2017-07-27 NOTE — ED Triage Notes (Signed)
Pt to ED via POV with c/o severe RT flank/back pain that started an hour ago. Pt denies any GU symptoms.

## 2017-07-27 NOTE — Assessment & Plan Note (Signed)
Changing gabapentin to Elavil qhs

## 2017-07-27 NOTE — Assessment & Plan Note (Signed)
She has no objective evidence of permanent damage or disability by recent Neurology evaluation which included MRI of brain and cervical spine.

## 2017-07-27 NOTE — ED Provider Notes (Signed)
[facility] Emergency Department Provider Note    First MD Initiated Contact with Patient 07/27/17 1413     (approximate)  I have reviewed the triage vital signs and the nursing notes.   HISTORY  Chief Complaint Flank Pain    HPI Molly Stone is a 36 y.o. female presents with chief complaint of severe cramping right flank and back pain that started early this morning. States she has a history of kidney stones but this feels different. No recent admissions to the hospital. Denies any dysuria or hematuria. Has been struggling with some constipation over the past several weeks. Denies any chest pain or shortness of breath. No fevers.   Past Medical History:  Diagnosis Date  . Allergy   . Anemia   . Asthma   . Chlamydia    h/o  . Disorder of vocal cord    spasmodic dysphonia  . Dysmenorrhea   . Dyspareunia, female   . Endometriosis   . Family history of endometriosis   . GERD (gastroesophageal reflux disease)   . Headache    migraine  . Heavy periods   . History of nephrolithiasis   . Hypertension   . Jaundice, physiologic, newborn   . Obesity (BMI 30-39.9)   . Tobacco user   . Vaginal Pap smear, abnormal    Family History  Problem Relation Age of Onset  . Endometriosis Mother   . Cancer Father        benign liver CA  . Stroke Father        recurrent blood clots on the brain   . Cancer Maternal Grandmother 30       ovarian Ca, still surviving   . Osteoporosis Maternal Grandmother   . Ovarian cancer Maternal Grandmother   . Heart disease Maternal Grandfather   . Cancer Maternal Grandfather 61       colon CA. metastatic  . Diabetes Maternal Uncle   . Heart disease Paternal Grandmother   . Breast cancer Neg Hx   . Colon cancer Neg Hx    Past Surgical History:  Procedure Laterality Date  . DILATION AND CURETTAGE OF UTERUS    . laparoscopy    . WISDOM TOOTH EXTRACTION     Patient Active Problem List   Diagnosis Date Noted    . Asthma 03/27/2017  . Shortness of breath 12/20/2016  . Whiplash injury syndrome, subsequent encounter 08/04/2016  . Concussion without loss of consciousness 08/04/2016  . OSA on CPAP 07/24/2016  . Generalized anxiety disorder 10/25/2015  . Tension headache, chronic 10/25/2015  . Family history of endometriosis 10/12/2015  . Dysmenorrhea 10/12/2015  . Acid reflux 08/15/2015  . H/O renal calculi 08/15/2015  . Obesity 08/15/2015  . Other malaise and fatigue 06/16/2014  . Encounter for preventive health examination 06/16/2014  . Malaise and fatigue 06/16/2014  . Sciatica 04/01/2014  . Vitamin D deficiency 04/12/2012  . 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  . History of nephrolithiasis   . GERD (gastroesophageal reflux disease)       Prior to Admission medications   Medication Sig Start Date End Date Taking? Authorizing Provider  budesonide-formoterol (SYMBICORT) 160-4.5 MCG/ACT inhaler Inhale 2 puffs into the lungs 2 (two) times daily. 03/24/17  Yes Kasa, Maretta Bees, MD  busPIRone (BUSPAR) 7.5 MG tablet Take 7.5 mg by mouth 2 (two) times daily.   Yes [provider]  fluticasone (FLONASE) 50 MCG/ACT nasal spray Place 1 spray into both nostrils daily. Patient taking differently: Place 2 sprays  into both nostrils daily.  07/25/17  Yes Crecencio Mc, MD  montelukast (SINGULAIR) 10 MG tablet Take 1 tablet (10 mg total) by mouth at bedtime. 01/07/17  Yes Crecencio Mc, MD  Multiple Vitamin (MULTI-VITAMINS) TABS Take 1 tablet by mouth daily.    Yes [provider]  omeprazole (PRILOSEC) 20 MG capsule Take 1 capsule (20 mg total) by mouth daily. 01/21/17  Yes Crecencio Mc, MD  albuterol (PROVENTIL HFA;VENTOLIN HFA) 108 (90 Base) MCG/ACT inhaler Inhale 2 puffs into the lungs every 6 (six) hours as needed for wheezing or shortness of breath. 03/24/17   Flora Lipps, MD  amitriptyline (ELAVIL) 25 MG tablet Take 1 tablet (25 mg total) by mouth at bedtime. Patient not taking: Reported on  07/27/2017 07/25/17   Crecencio Mc, MD    Allergies Morphine and related; Tramadol hcl; Buprenorphine hcl; and Prednisone    Social History Social History  Substance Use Topics  . Smoking status: Former Smoker    Types: Cigarettes    Quit date: 10/2016  . Smokeless tobacco: Never Used  . Alcohol use No     Comment: wine occassionally    Review of Systems Patient denies headaches, rhinorrhea, blurry vision, numbness, shortness of breath, chest pain, edema, cough, abdominal pain, nausea, vomiting, diarrhea, dysuria, fevers, rashes or hallucinations unless otherwise stated above in HPI. ____________________________________________   PHYSICAL EXAM:  VITAL SIGNS: Vitals:   07/27/17 1404 07/27/17 1442  BP: (!) 146/69 127/89  Pulse: 84 77  Resp: 16 16  Temp: 97.8 F (36.6 C)   SpO2: 98% 99%    Constitutional: Alert and oriented. Well appearing and in no acute distress. Eyes: Conjunctivae are normal.  Head: Atraumatic. Nose: No congestion/rhinnorhea. Mouth/Throat: Mucous membranes are moist.   Neck: No stridor. Painless ROM.  Cardiovascular: Normal rate, regular rhythm. Grossly normal heart sounds.  Good peripheral circulation. Respiratory: Normal respiratory effort.  No retractions. Lungs CTAB. Gastrointestinal: Soft and nontender. No distention. No abdominal bruits. + right CVA tenderness. Musculoskeletal: No lower extremity tenderness nor edema.  No joint effusions. Neurologic:  Normal speech and language. No gross focal neurologic deficits are appreciated. No facial droop Skin:  Skin is warm, dry and intact. No rash noted. Psychiatric: Mood and affect are normal. Speech and behavior are normal.  ____________________________________________   LABS (all labs ordered are listed, but only abnormal results are displayed)  Results for orders placed or performed during the hospital encounter of 07/27/17 (from the past 24 hour(s))  Urinalysis, Complete w Microscopic      Status: Abnormal   Collection Time: 07/27/17  2:18 PM  Result Value Ref Range   Color, Urine YELLOW (A) YELLOW   APPearance HAZY (A) CLEAR   Specific Gravity, Urine 1.019 1.005 - 1.030   pH 6.0 5.0 - 8.0   Glucose, UA NEGATIVE NEGATIVE mg/dL   Hgb urine dipstick NEGATIVE NEGATIVE   Bilirubin Urine NEGATIVE NEGATIVE   Ketones, ur NEGATIVE NEGATIVE mg/dL   Protein, ur NEGATIVE NEGATIVE mg/dL   Nitrite NEGATIVE NEGATIVE   Leukocytes, UA TRACE (A) NEGATIVE   RBC / HPF 0-5 0 - 5 RBC/hpf   WBC, UA 0-5 0 - 5 WBC/hpf   Bacteria, UA NONE SEEN NONE SEEN   Squamous Epithelial / LPF 0-5 (A) NONE SEEN   Mucus PRESENT   CBC with Differential/Platelet     Status: None   Collection Time: 07/27/17  2:18 PM  Result Value Ref Range   WBC 6.5 3.6 - 11.0  K/uL   RBC 4.83 3.80 - 5.20 MIL/uL   Hemoglobin 14.8 12.0 - 16.0 g/dL   HCT 43.1 35.0 - 47.0 %   MCV 89.3 80.0 - 100.0 fL   MCH 30.6 26.0 - 34.0 pg   MCHC 34.3 32.0 - 36.0 g/dL   RDW 13.0 11.5 - 14.5 %   Platelets 168 150 - 440 K/uL   Neutrophils Relative % 49 %   Neutro Abs 3.1 1.4 - 6.5 K/uL   Lymphocytes Relative 34 %   Lymphs Abs 2.2 1.0 - 3.6 K/uL   Monocytes Relative 9 %   Monocytes Absolute 0.6 0.2 - 0.9 K/uL   Eosinophils Relative 7 %   Eosinophils Absolute 0.5 0 - 0.7 K/uL   Basophils Relative 1 %   Basophils Absolute 0.0 0 - 0.1 K/uL  Comprehensive metabolic panel     Status: Abnormal   Collection Time: 07/27/17  2:18 PM  Result Value Ref Range   Sodium 135 135 - 145 mmol/L   Potassium 3.6 3.5 - 5.1 mmol/L   Chloride 104 101 - 111 mmol/L   CO2 24 22 - 32 mmol/L   Glucose, Bld 104 (H) 65 - 99 mg/dL   BUN 12 6 - 20 mg/dL   Creatinine, Ser 0.81 0.44 - 1.00 mg/dL   Calcium 8.7 (L) 8.9 - 10.3 mg/dL   Total Protein 7.9 6.5 - 8.1 g/dL   Albumin 3.8 3.5 - 5.0 g/dL   AST 23 15 - 41 U/L   ALT 24 14 - 54 U/L   Alkaline Phosphatase 63 38 - 126 U/L   Total Bilirubin 0.6 0.3 - 1.2 mg/dL   GFR calc non Af Amer >60 >60 mL/min   GFR  calc Af Amer >60 >60 mL/min   Anion gap 7 5 - 15  Pregnancy, urine POC     Status: None   Collection Time: 07/27/17  2:27 PM  Result Value Ref Range   Preg Test, Ur NEGATIVE NEGATIVE   ____________________________________________  EKG____________________________________________  RADIOLOGY  I personally reviewed all radiographic images ordered to evaluate for the above acute complaints and reviewed radiology reports and findings.  These findings were personally discussed with the patient.  Please see medical record for radiology report.  ____________________________________________   PROCEDURES  Procedure(s) performed:  Procedures    Critical Care performed: no ____________________________________________   INITIAL IMPRESSION / ASSESSMENT AND PLAN / ED COURSE  Pertinent labs & imaging results that were available during my care of the patient were reviewed by me and considered in my medical decision making (see chart for details).  DDX: pyelo, Stone, cholelithiasis, msk strain  Molly Stone is a 36 y.o. who presents to the ED with right flank pain. No fevers, no systemic symptoms. - urinary symptoms. Denies trauma or injury. Afebrile in ED. Exam as above. Flank TTP, otherwise abdominal exam is benign. No peritoneal signs. Possible kidney Stone, cystitis, or pyelonephritis.   Checking urine. UPT negative UA without evidence of infection CT Stone with 49mm right UVJ Stone with moder hydro. Clinical picture is not consistent with appendicitis, diverticulitis, pancreatitis, cholecystitis, bowel perforation, aortic dissection, splenic injury or acute abdominal process at this time.  Pain improved, tolerating PO. Repeat ABD exam benign, will plan supportive treatment and early follow up for recheck.       ____________________________________________   FINAL CLINICAL IMPRESSION(S) / ED DIAGNOSES  Final diagnoses:  Ureterolithiasis  Right flank pain      NEW  MEDICATIONS STARTED DURING  THIS VISIT:  New Prescriptions   No medications on file     Note:  This document was prepared using Dragon voice recognition software and may include unintentional dictation errors.    Merlyn Lot, MD 07/27/17 2016

## 2017-07-27 NOTE — Assessment & Plan Note (Signed)
I have congratulated her in reduction of   BMI and encouraged  Continued weight loss with goal of 10% of body weigh over the next 6 months using a low glycemic index diet and regular exercise a minimum of 5 days per week.    

## 2017-07-30 ENCOUNTER — Ambulatory Visit (INDEPENDENT_AMBULATORY_CARE_PROVIDER_SITE_OTHER): Payer: Commercial Managed Care - PPO

## 2017-07-30 ENCOUNTER — Encounter: Payer: Self-pay | Admitting: Internal Medicine

## 2017-07-30 ENCOUNTER — Ambulatory Visit (INDEPENDENT_AMBULATORY_CARE_PROVIDER_SITE_OTHER): Payer: Commercial Managed Care - PPO | Admitting: Internal Medicine

## 2017-07-30 VITALS — BP 114/76 | HR 79 | Temp 98.0°F | Resp 16 | Ht 68.0 in | Wt 285.4 lb

## 2017-07-30 DIAGNOSIS — R10A1 Flank pain, right side: Secondary | ICD-10-CM

## 2017-07-30 DIAGNOSIS — R350 Frequency of micturition: Secondary | ICD-10-CM | POA: Diagnosis not present

## 2017-07-30 DIAGNOSIS — Z87898 Personal history of other specified conditions: Secondary | ICD-10-CM

## 2017-07-30 DIAGNOSIS — R109 Unspecified abdominal pain: Secondary | ICD-10-CM | POA: Diagnosis not present

## 2017-07-30 DIAGNOSIS — N201 Calculus of ureter: Secondary | ICD-10-CM | POA: Diagnosis not present

## 2017-07-30 LAB — URINALYSIS, MICROSCOPIC ONLY: RBC / HPF: NONE SEEN (ref 0–?)

## 2017-07-30 LAB — POCT URINALYSIS DIPSTICK
BILIRUBIN UA: NEGATIVE
Glucose, UA: NEGATIVE
Ketones, UA: NEGATIVE
Leukocytes, UA: NEGATIVE
NITRITE UA: NEGATIVE
Protein, UA: NEGATIVE
RBC UA: NEGATIVE
Spec Grav, UA: <=1.005 — AB (ref 1.010–1.025)
Urobilinogen, UA: 0.2 E.U./dL
pH, UA: 6.5 (ref 5.0–8.0)

## 2017-07-30 NOTE — Patient Instructions (Signed)
I am checking to make sure you do not have a UTI from the stone  Strain your urine and if you find any particulate matter , bring it back to [place] in the container

## 2017-07-30 NOTE — Assessment & Plan Note (Signed)
3 mm obstructing stone at the  right UVJconfirmed  by CT scan done Oct 7.  Symptoms have improved.  I have ordered a 1 view of pelvis and Urine studies to rule out infection .  Strainer given.

## 2017-07-30 NOTE — Progress Notes (Signed)
Subjective:  Patient ID: Molly Stone, female    DOB: 1981/09/05  Age: 36 y.o. MRN: [id number]  CC: The primary encounter diagnosis was Urinary frequency. Diagnoses of Calculi, ureter, H/O right flank pain, and Acute right flank pain were also pertinent to this visit.  HPI [name] presents for ER follow up for kidney stone. Patient developed severe right sdied flank pain on Sunday "worse than all 3 childbirths together")  CT scan confirmed an obstructing 3 mm right UVJ stone with moderate hydronephrosis .  Was NOT given a strainer or flomax  So she has Has not been straining urine and has not seen a stone in the commode.  HOwever her pain has imporved significantly ,  Not taking pain meds. And she notes increased urination without blood or dysuria.  Has had some mild nausea and mild right lower back pain since then.  No fevers or chills.     Outpatient Medications Prior to Visit  Medication Sig Dispense Refill  . albuterol (PROVENTIL HFA;VENTOLIN HFA) 108 (90 Base) MCG/ACT inhaler Inhale 2 puffs into the lungs every 6 (six) hours as needed for wheezing or shortness of breath. 18 g 3  . amitriptyline (ELAVIL) 25 MG tablet Take 1 tablet (25 mg total) by mouth at bedtime. 90 tablet 1  . budesonide-formoterol (SYMBICORT) 160-4.5 MCG/ACT inhaler Inhale 2 puffs into the lungs 2 (two) times daily. 1 Inhaler 5  . busPIRone (BUSPAR) 7.5 MG tablet Take 7.5 mg by mouth 2 (two) times daily.    . fluticasone (FLONASE) 50 MCG/ACT nasal spray Place 1 spray into both nostrils daily. (Patient taking differently: Place 2 sprays into both nostrils daily. ) 15.8 g 5  . HYDROcodone-acetaminophen (NORCO) 5-325 MG tablet Take 1 tablet by mouth every 4 (four) hours as needed for moderate pain. 6 tablet 0  . montelukast (SINGULAIR) 10 MG tablet Take 1 tablet (10 mg total) by mouth at bedtime. 30 tablet 3  . Multiple Vitamin (MULTI-VITAMINS) TABS Take 1 tablet by mouth daily.     Marland Kitchen omeprazole (PRILOSEC)  20 MG capsule Take 1 capsule (20 mg total) by mouth daily. 30 capsule 5  . promethazine (PHENERGAN) 12.5 MG tablet Take 1 tablet (12.5 mg total) by mouth every 6 (six) hours as needed for nausea or vomiting. 12 tablet 0   Facility-Administered Medications Prior to Visit  Medication Dose Route Frequency Provider Last Rate Last Dose  . methacholine (PROVOCHOLINE) inhaler solution 8 mg  2 mL Inhalation Once Crecencio Mc, MD       Followed by  . methacholine (PROVOCHOLINE) inhaler solution 32 mg  2 mL Inhalation Once Crecencio Mc, MD       Followed by  . albuterol (PROVENTIL) (2.5 MG/3ML) 0.083% nebulizer solution 2.5 mg  2.5 mg Nebulization Once Crecencio Mc, MD        Review of Systems;  Patient denies headache, fevers, malaise, unintentional weight loss, skin rash, eye pain, sinus congestion and sinus pain, sore throat, dysphagia,  hemoptysis , cough, dyspnea, wheezing, chest pain, palpitations, orthopnea, edema, abdominal pain, nausea, melena, diarrhea, constipation, flank pain, dysuria, hematuria, urinary  Frequency, nocturia, numbness, tingling, seizures,  Focal weakness, Loss of consciousness,  Tremor, insomnia, depression, anxiety, and suicidal ideation.      Objective:  BP 114/76 (BP Location: Left Arm, Patient Position: Sitting, Cuff Size: Large)   Pulse 79   Temp 98 F (36.7 C) (Oral)   Resp 16   Ht 5\' 8"  (1.727 m)  Wt 285 lb 6.4 oz (129.5 kg)   SpO2 97%   BMI 43.39 kg/m   BP Readings from Last 3 Encounters:  07/30/17 114/76  07/27/17 119/76  07/25/17 122/62    Wt Readings from Last 3 Encounters:  07/30/17 285 lb 6.4 oz (129.5 kg)  07/27/17 280 lb (127 kg)  07/25/17 287 lb 12.8 oz (130.5 kg)    General appearance: alert, cooperative and appears stated age  Neck: no adenopathy, no carotid bruit, supple, symmetrical, trachea midline and thyroid not enlarged, symmetric, no tenderness/mass/nodules Back: symmetric, no curvature. ROM normal. No CVA  tenderness. Lungs: clear to auscultation bilaterally Heart: regular rate and rhythm, S1, S2 normal, no murmur, click, rub or gallop Abdomen: soft, non-tender; bowel sounds normal; no masses,  no organomegaly Back mild right CVA tenderness no guiarding  Pulses: 2+ and symmetric Skin: Skin color, texture, turgor normal. No rashes or lesions Lymph nodes: Cervical, supraclavicular, and axillary nodes normal.  No results found for: HGBA1C  Lab Results  Component Value Date   CREATININE 0.81 07/27/2017   CREATININE 0.69 03/26/2017   CREATININE 0.75 07/23/2016    Lab Results  Component Value Date   WBC 6.5 07/27/2017   HGB 14.8 07/27/2017   HCT 43.1 07/27/2017   PLT 168 07/27/2017   GLUCOSE 104 (H) 07/27/2017   CHOL 195 07/23/2016   TRIG 105.0 07/23/2016   HDL 40.90 07/23/2016   LDLDIRECT 155 (H) 08/18/2015   LDLCALC 133 (H) 07/23/2016   ALT 24 07/27/2017   AST 23 07/27/2017   NA 135 07/27/2017   K 3.6 07/27/2017   CL 104 07/27/2017   CREATININE 0.81 07/27/2017   BUN 12 07/27/2017   CO2 24 07/27/2017   TSH 0.75 07/23/2016    Ct Renal Stone Study  Result Date: 07/27/2017 CLINICAL DATA:  Severe right flank and back pain for 1 hour. EXAM: CT ABDOMEN AND PELVIS WITHOUT CONTRAST TECHNIQUE: Multidetector CT imaging of the abdomen and pelvis was performed following the standard protocol without IV contrast. COMPARISON:  08/18/2010 unenhanced CT abdomen/pelvis. FINDINGS: Lower chest: No significant pulmonary nodules or acute consolidative airspace disease. Hepatobiliary: Normal liver with no liver mass. Normal gallbladder with no radiopaque cholelithiasis. No biliary ductal dilatation. Pancreas: Normal, with no mass or duct dilation. Spleen: Normal size. No mass. Adrenals/Urinary Tract: Normal adrenals. Obstructing 3 mm right ureterovesical junction stone with mild right hydroureteronephrosis. No additional renal or ureteral stones. Normal caliber left ureter with no left hydronephrosis.  No contour deforming renal mass. Normal bladder. Stomach/Bowel: Small hiatal hernia. Otherwise grossly normal nondistended stomach. Normal caliber small bowel with no small bowel wall thickening. Normal appendix . Normal large bowel with no diverticulosis, large bowel wall thickening or pericolonic fat stranding. Vascular/Lymphatic: Normal caliber abdominal aorta. No pathologically enlarged lymph nodes in the abdomen or pelvis. Reproductive: Grossly normal uterus.  No adnexal mass. Other: No pneumoperitoneum, ascites or focal fluid collection. Musculoskeletal: No aggressive appearing focal osseous lesions. IMPRESSION: 1. Obstructing 3 mm right UVJ stone with mild right hydroureteronephrosis. 2. Small hiatal hernia. Electronically Signed   By: Ilona Sorrel M.D.   On: 07/27/2017 16:48    Assessment & Plan:   Problem List Items Addressed This Visit    Calculi, ureter    3 mm obstructing stone at the  right UVJconfirmed  by CT scan done Oct 7.  Symptoms have improved.  I have ordered a 1 view of pelvis and Urine studies to rule out infection .  Strainer given.  Relevant Orders   DG Pelvis 1-2 Views    Other Visit Diagnoses    Urinary frequency    -  Primary   Relevant Orders   POCT Urinalysis Dipstick (Completed)   Urine Culture   Urine Microscopic Only   H/O right flank pain       Acute right flank pain          I am having Ms. Fraiser maintain her MULTI-VITAMINS, montelukast, omeprazole, albuterol, budesonide-formoterol, fluticasone, amitriptyline, busPIRone, HYDROcodone-acetaminophen, and promethazine.  No orders of the defined types were placed in this encounter.   There are no discontinued medications.  Follow-up: No Follow-up on file.   Crecencio Mc, MD

## 2017-08-01 LAB — URINE CULTURE
MICRO NUMBER: 81129473
Result:: NO GROWTH
SPECIMEN QUALITY:: ADEQUATE

## 2017-08-01 MED ORDER — PROMETHAZINE HCL 12.5 MG PO TABS: 12.5000 mg | ORAL_TABLET | Freq: Four times a day (QID) | ORAL | 0 refills | Status: DC | PRN

## 2017-08-01 NOTE — Progress Notes (Signed)
08/04/2017 3:26 PM   Molly Stone [birth year] [id number]  Referring provider: Crecencio Mc, MD [place], [place] [address]  Chief Complaint  Patient presents with  . New Patient (Initial Visit)    kidney stone referred by ER    HPI: Patient is a 36 year old Caucasian female who presents/is referred by Dr. Deborra Medina for nephrolithiasis.  Patient states the onset of the pain was one weeks ago.   It was sharp.  It lasted for several hours.  The pain was located in the right flank and radiated to right waist.  The pain was a 10/10.  Nothing made the pain better.   Nothing made the pain worse.  She did have nausea or vomiting, she denies fevers and chills.    In the ED, her UA was unremarkable.   Her WBC count was 6.5.  Her serum creatinine was 0.81.  Her urine culture was negative.  CT Renal Stone study completed on 07/27/2017 noted an obstructing 3 mm right UVJ stone with mild right hydroureteronephrosis.  Small hiatal hernia.  Today, she is experiencing frequency and nocturia.  She is not experiencing right flank pain or nausea at this time.  She is not having dysuria, gross hematuria or suprapubic pain.  She has not had fevers, chills, nausea or vomiting.    She does have a prior history of stones.  She passed that stone.      PMH: Past Medical History:  Diagnosis Date  . Allergy   . Anemia   . Anxiety   . Asthma   . Chlamydia    h/o  . Disorder of vocal cord    spasmodic dysphonia  . Dysmenorrhea   . Dyspareunia, female   . Endometriosis   . Family history of endometriosis   . GERD (gastroesophageal reflux disease)   . Headache    migraine  . Heavy periods   . History of nephrolithiasis   . Hypertension   . Jaundice, physiologic, newborn   . Obesity (BMI 30-39.9)   . Sleep apnea   . Tobacco user   . Vaginal Pap smear, abnormal     Surgical History: Past Surgical History:  Procedure Laterality Date  . DILATION AND  CURETTAGE OF UTERUS    . laparoscopy    . WISDOM TOOTH EXTRACTION      Home Medications:  Allergies as of 08/04/2017      Reactions   Morphine And Related Itching   And shaking   Tramadol Hcl    anxious   Buprenorphine Hcl Itching   And shaking   Prednisone Anxiety      Medication List       Accurate as of 08/04/17  3:26 PM. Always use your most recent med list.          albuterol 108 (90 Base) MCG/ACT inhaler Commonly known as:  PROVENTIL HFA;VENTOLIN HFA Inhale 2 puffs into the lungs every 6 (six) hours as needed for wheezing or shortness of breath.   amitriptyline 25 MG tablet Commonly known as:  ELAVIL Take 1 tablet (25 mg total) by mouth at bedtime.   budesonide-formoterol 160-4.5 MCG/ACT inhaler Commonly known as:  SYMBICORT Inhale 2 puffs into the lungs 2 (two) times daily.   busPIRone 7.5 MG tablet Commonly known as:  BUSPAR Take 7.5 mg by mouth 2 (two) times daily.   fexofenadine-pseudoephedrine 60-120 MG 12 hr tablet Commonly known as:  ALLEGRA-D Take by mouth.  fluticasone 50 MCG/ACT nasal spray Commonly known as:  FLONASE Place 1 spray into both nostrils daily.   gabapentin 300 MG capsule Commonly known as:  NEURONTIN Take 1 tab twice a day for a week then increase to 1 tab three times a day,   HYDROcodone-acetaminophen 5-325 MG tablet Commonly known as:  NORCO Take 1 tablet by mouth every 4 (four) hours as needed for moderate pain.   montelukast 10 MG tablet Commonly known as:  SINGULAIR Take 1 tablet (10 mg total) by mouth at bedtime.   MULTI-VITAMINS Tabs Take 1 tablet by mouth daily.   multivitamin capsule Take by mouth.   omeprazole 20 MG capsule Commonly known as:  PRILOSEC Take 1 capsule (20 mg total) by mouth daily.   promethazine 12.5 MG tablet Commonly known as:  PHENERGAN Take 1 tablet (12.5 mg total) by mouth every 6 (six) hours as needed for nausea or vomiting.       Allergies:  Allergies  Allergen Reactions  .  Morphine And Related Itching    And shaking  . Tramadol Hcl     anxious  . Buprenorphine Hcl Itching    And shaking  . Prednisone Anxiety    Family History: Family History  Problem Relation Age of Onset  . Endometriosis Mother   . Cancer Father        benign liver CA  . Stroke Father        recurrent blood clots on the brain   . Cancer Maternal Grandmother 30       ovarian Ca, still surviving   . Osteoporosis Maternal Grandmother   . Ovarian cancer Maternal Grandmother   . Heart disease Maternal Grandfather   . Cancer Maternal Grandfather 45       colon CA. metastatic  . Diabetes Maternal Uncle   . Heart disease Paternal Grandmother   . Breast cancer Neg Hx   . Colon cancer Neg Hx   . Kidney cancer Neg Hx   . Bladder Cancer Neg Hx     Social History:  reports that she quit smoking about 9 months ago. Her smoking use included Cigarettes. She has never used smokeless tobacco. She reports that she drinks alcohol. She reports that she does not use drugs.  ROS: UROLOGY Frequent Urination?: Yes Hard to postpone urination?: No Burning/pain with urination?: No Get up at night to urinate?: Yes Leakage of urine?: No Urine stream starts and stops?: No Trouble starting stream?: No Do you have to strain to urinate?: No Blood in urine?: No Urinary tract infection?: No Sexually transmitted disease?: No Injury to kidneys or bladder?: No Painful intercourse?: Yes Weak stream?: No Currently pregnant?: No Vaginal bleeding?: Yes Last menstrual period?: n  Gastrointestinal Nausea?: No Vomiting?: No Indigestion/heartburn?: No Diarrhea?: No Constipation?: No  Constitutional Fever: No Night sweats?: No Weight loss?: No Fatigue?: No  Skin Skin rash/lesions?: No Itching?: No  Eyes Blurred vision?: No Double vision?: No  Ears/Nose/Throat Sore throat?: No Sinus problems?: No  Hematologic/Lymphatic Swollen glands?: No Easy bruising?: No  Cardiovascular Leg  swelling?: No Chest pain?: No  Respiratory Cough?: No Shortness of breath?: No  Endocrine Excessive thirst?: No  Musculoskeletal Back pain?: No Joint pain?: No  Neurological Headaches?: No Dizziness?: No  Psychologic Depression?: No Anxiety?: No  Physical Exam: BP 122/81   Pulse 80   Ht 5\' 8"  (1.727 m)   Wt 284 lb 6.4 oz (129 kg)   LMP 08/01/2017   BMI 43.24 kg/m   Constitutional:  Well nourished. Alert and oriented, No acute distress. HEENT: [place] AT, moist mucus membranes. Trachea midline, no masses. Cardiovascular: No clubbing, cyanosis, or edema. Respiratory: Normal respiratory effort, no increased work of breathing. GI: Abdomen is soft, non tender, non distended, no abdominal masses. Liver and spleen not palpable.  No hernias appreciated.  Stool sample for occult testing is not indicated.   GU: No CVA tenderness.  No bladder fullness or masses.   Skin: No rashes, bruises or suspicious lesions. Lymph: No cervical or inguinal adenopathy. Neurologic: Grossly intact, no focal deficits, moving all 4 extremities. Psychiatric: Normal mood and affect.  Laboratory Data: Lab Results  Component Value Date   WBC 6.5 07/27/2017   HGB 14.8 07/27/2017   HCT 43.1 07/27/2017   MCV 89.3 07/27/2017   PLT 168 07/27/2017    Lab Results  Component Value Date   CREATININE 0.81 07/27/2017    Lab Results  Component Value Date   AST 23 07/27/2017   Lab Results  Component Value Date   ALT 24 07/27/2017   I have reviewed the labs.   Pertinent Imaging: CLINICAL DATA:  Severe right flank and back pain for 1 hour.  EXAM: CT ABDOMEN AND PELVIS WITHOUT CONTRAST  TECHNIQUE: Multidetector CT imaging of the abdomen and pelvis was performed following the standard protocol without IV contrast.  COMPARISON:  08/18/2010 unenhanced CT abdomen/pelvis.  FINDINGS: Lower chest: No significant pulmonary nodules or acute consolidative airspace disease.  Hepatobiliary:  Normal liver with no liver mass. Normal gallbladder with no radiopaque cholelithiasis. No biliary ductal dilatation.  Pancreas: Normal, with no mass or duct dilation.  Spleen: Normal size. No mass.  Adrenals/Urinary Tract: Normal adrenals. Obstructing 3 mm right ureterovesical junction stone with mild right hydroureteronephrosis. No additional renal or ureteral stones. Normal caliber left ureter with no left hydronephrosis. No contour deforming renal mass. Normal bladder.  Stomach/Bowel: Small hiatal hernia. Otherwise grossly normal nondistended stomach. Normal caliber small bowel with no small bowel wall thickening. Normal appendix . Normal large bowel with no diverticulosis, large bowel wall thickening or pericolonic fat stranding.  Vascular/Lymphatic: Normal caliber abdominal aorta. No pathologically enlarged lymph nodes in the abdomen or pelvis.  Reproductive: Grossly normal uterus.  No adnexal mass.  Other: No pneumoperitoneum, ascites or focal fluid collection.  Musculoskeletal: No aggressive appearing focal osseous lesions.  IMPRESSION: 1. Obstructing 3 mm right UVJ stone with mild right hydroureteronephrosis. 2. Small hiatal hernia.   I have independently reviewed the films.    Assessment & Plan:   1. Right ureteral stone  - asymptomatic at this time - most likely passed   - will obtain a RUS to evaluate for hydronephrosis  - Advised to contact our office or seek treatment in the ED if becomes febrile or pain/ vomiting are difficult control in order to arrange for emergent/urgent intervention   2. Right hydronephrosis  - obtain RUS to ensure the hydronephrosis has resolved.    3. Microscopic hematuria  - continue to monitor the patient's UA after the treatment/passage of the stone to ensure the hematuria has resolved  - if hematuria persists, we will pursue a hematuria workup with CT Urogram and cystoscopy if appropriate.  4. History of  nephrolithiasis  - will offer 24 hour urine work up once RUS confirms hydronephrosis has resolved   Return for I will call patient with results.  These notes generated with voice recognition software. I apologize for typographical errors.  Zara Council, PA-C  [facility] [address]  [place], [place] [address] [phone]

## 2017-08-01 NOTE — Telephone Encounter (Signed)
Pt called and stated that she is still having pain and is really nauseous. Please advise, thank you!  Call pt @ [phone]

## 2017-08-04 ENCOUNTER — Ambulatory Visit (INDEPENDENT_AMBULATORY_CARE_PROVIDER_SITE_OTHER): Payer: Commercial Managed Care - PPO | Admitting: Urology

## 2017-08-04 ENCOUNTER — Encounter: Payer: Self-pay | Admitting: Urology

## 2017-08-04 VITALS — BP 122/81 | HR 80 | Ht 68.0 in | Wt 284.4 lb

## 2017-08-04 DIAGNOSIS — N201 Calculus of ureter: Secondary | ICD-10-CM | POA: Diagnosis not present

## 2017-08-04 DIAGNOSIS — N132 Hydronephrosis with renal and ureteral calculous obstruction: Secondary | ICD-10-CM | POA: Diagnosis not present

## 2017-08-04 DIAGNOSIS — Z87442 Personal history of urinary calculi: Secondary | ICD-10-CM | POA: Diagnosis not present

## 2017-08-04 DIAGNOSIS — R3129 Other microscopic hematuria: Secondary | ICD-10-CM

## 2017-08-18 ENCOUNTER — Ambulatory Visit
Admission: RE | Admit: 2017-08-18 | Discharge: 2017-08-18 | Disposition: A | Discharge status: Home / Self Care | Payer: Commercial Managed Care - PPO | Source: Ambulatory Visit | Attending: Urology | Admitting: Urology

## 2017-08-18 DIAGNOSIS — Z87442 Personal history of urinary calculi: Secondary | ICD-10-CM | POA: Diagnosis not present

## 2017-08-18 DIAGNOSIS — R3129 Other microscopic hematuria: Secondary | ICD-10-CM

## 2017-08-18 DIAGNOSIS — R3121 Asymptomatic microscopic hematuria: Secondary | ICD-10-CM | POA: Diagnosis not present

## 2017-08-18 DIAGNOSIS — N132 Hydronephrosis with renal and ureteral calculous obstruction: Secondary | ICD-10-CM

## 2017-08-18 DIAGNOSIS — N201 Calculus of ureter: Secondary | ICD-10-CM

## 2017-08-18 DIAGNOSIS — R944 Abnormal results of kidney function studies: Secondary | ICD-10-CM | POA: Diagnosis not present

## 2017-08-19 ENCOUNTER — Telehealth: Payer: Self-pay | Admitting: Family Medicine

## 2017-08-19 NOTE — Telephone Encounter (Signed)
Patient notified and will do the 24 hour urine. She states she only had HTN when she was pregnant for a short time.

## 2017-08-19 NOTE — Telephone Encounter (Signed)
-----   Message from Nori Riis, PA-C sent at 08/19/2017  9:01 AM EDT ----- Please let Molly Stone know that her RUS did not show any swelling in her kidneys, so the stone must have passed.  I would like her to pursue a 24 hour urine study at this time.  The ultrasound also demonstrated that her right kidney was smaller than her left kidney.  The reason for this is unknown at this time.  It may be a normal variant.  Does she have a history of HTN?  We can discuss this further once her 24 hour urine study is completed.

## 2017-08-25 ENCOUNTER — Other Ambulatory Visit: Payer: Commercial Managed Care - PPO

## 2017-08-25 DIAGNOSIS — N200 Calculus of kidney: Secondary | ICD-10-CM | POA: Diagnosis not present

## 2017-09-02 ENCOUNTER — Other Ambulatory Visit: Payer: Self-pay | Admitting: Urology

## 2017-09-04 ENCOUNTER — Telehealth: Payer: Self-pay

## 2017-09-04 NOTE — Telephone Encounter (Signed)
Spoke with pt in reference to litholink results. Made aware of dietary modifications. Made aware will mail out results. Also made aware if wants to pursue dietary changes then will need redo study in 6 weeks. Pt voiced understanding of whole conversation.

## 2017-09-04 NOTE — Telephone Encounter (Signed)
-----   Message from Nori Riis, PA-C sent at 09/02/2017  3:29 PM EST ----- Please let Molly Stone know that her Litholink results are available.  We will mail her the results.  She needs to follow a low oxalate diet and have 800 mg to 1200 mg of dietary calcium.  She needs to reduce her meat consumption to 0.8 to 1.3 gm/kg/day.  The [facility] site has more information on the dietary requirements for her.  Please call if any questions.  We need to repeat the study in 6 weeks if she wishes to follow the dietary changes.

## 2017-09-24 ENCOUNTER — Ambulatory Visit: Payer: Self-pay | Admitting: *Deleted

## 2017-09-24 NOTE — Telephone Encounter (Signed)
There  Are no  Signs  Of  Any  Infection  No  Redness   Swelling  Pain or  Pus  . Pt  Was  Advised  Not to  Cut around the  Nail  Or to pull  On the  Nail or  The  Skin  Around it .  Pt was  Advised  To  Wear open  Shoes  If possible  Avoid   Tight  Shoes . And  To let us  Know if any  Redness  Pain  Ors  Swelling    Reason for Disposition . Bruised toenail  Answer Assessment - Initial Assessment Questions 1. MECHANISM: "How did the injury happen?"      PT   Was  Wearing   Tight  Shoes     And  Did  Lots  Of  Walking  At  [place] fair  About  5  Weeks  Ago. Pt  Reports  She  Noticed  Shoes  Were  Tight  And  painfull  But  Continued  To  Walk .  2. ONSET: "When did the injury happen?" (Minutes or hours ago)      Approx  5  Weeks  ago 3. LOCATION: "What part of the toe is injured?" "Is the nail damaged?"       R toenail  is  discolred   Black and  Purple  L  TOE NAIL  IS  NOT  AS BAD WITH  1/2 NAIL  BED  AVAILABLE  4. APPEARANCE of TOE INJURY: "What does the injury look like?"  Basically  It is  Just the nail  Beds  Toes  Below   Are  Normal        5. SEVERITY: "Can you use the foot normally?" "Can you walk?"       Normal  Function   Able  To  Walk    6. SIZE: For cuts, bruises, or swelling, ask: "How large is it?" (e.g., inches or centimeters;  entire toe)       Just  The  Nail  Bed   7. PAIN: "Is there pain?" If so, ask: "How bad is the pain?"   (e.g., Scale 1-10; or mild, moderate, severe)       No  pain 8. TETANUS: For any breaks in the skin, ask: "When was the last tetanus booster?"     No 9. DIABETES: "Do you have a history of diabetes or poor circulation in the feet?"      No 10. OTHER SYMPTOMS: "Do you have any other symptoms?"        NO11. PREGNANCY: "Is there any chance you are pregnant?" "When was your last menstrual period?"        lmP  3   WEEKS   AGO   NOT  PERIOD  Protocols used: TOE INJURY-A-AH

## 2017-10-01 ENCOUNTER — Other Ambulatory Visit: Payer: Self-pay | Admitting: Family

## 2017-10-01 DIAGNOSIS — F411 Generalized anxiety disorder: Secondary | ICD-10-CM

## 2017-10-11 ENCOUNTER — Encounter: Payer: Self-pay | Admitting: Urology

## 2017-10-13 ENCOUNTER — Other Ambulatory Visit: Payer: Self-pay | Admitting: Internal Medicine

## 2017-10-17 DIAGNOSIS — J069 Acute upper respiratory infection, unspecified: Secondary | ICD-10-CM | POA: Diagnosis not present

## 2017-10-20 ENCOUNTER — Other Ambulatory Visit: Payer: Self-pay

## 2017-10-20 MED ORDER — OMEPRAZOLE 20 MG PO CPDR: 20.0000 mg | DELAYED_RELEASE_CAPSULE | Freq: Every day | ORAL | 1 refills | Status: DC

## 2017-10-22 ENCOUNTER — Other Ambulatory Visit: Payer: Self-pay | Admitting: Urology

## 2017-10-22 DIAGNOSIS — Z87442 Personal history of urinary calculi: Secondary | ICD-10-CM

## 2017-10-22 DIAGNOSIS — N201 Calculus of ureter: Secondary | ICD-10-CM

## 2017-10-22 NOTE — Progress Notes (Signed)
I have [facility] a [facility] to [facility].

## 2017-10-24 DIAGNOSIS — J019 Acute sinusitis, unspecified: Secondary | ICD-10-CM | POA: Diagnosis not present

## 2017-10-30 ENCOUNTER — Other Ambulatory Visit: Payer: Self-pay | Admitting: Internal Medicine

## 2017-10-30 DIAGNOSIS — F411 Generalized anxiety disorder: Secondary | ICD-10-CM

## 2017-10-31 ENCOUNTER — Other Ambulatory Visit: Payer: Self-pay | Admitting: Internal Medicine

## 2017-11-10 DIAGNOSIS — S90221A Contusion of right lesser toe(s) with damage to nail, initial encounter: Secondary | ICD-10-CM | POA: Diagnosis not present

## 2017-11-12 DIAGNOSIS — J320 Chronic maxillary sinusitis: Secondary | ICD-10-CM | POA: Diagnosis not present

## 2017-12-21 DIAGNOSIS — J45909 Unspecified asthma, uncomplicated: Secondary | ICD-10-CM | POA: Diagnosis not present

## 2017-12-21 DIAGNOSIS — R05 Cough: Secondary | ICD-10-CM | POA: Diagnosis not present

## 2017-12-21 DIAGNOSIS — R0789 Other chest pain: Secondary | ICD-10-CM | POA: Diagnosis not present

## 2017-12-23 ENCOUNTER — Telehealth: Payer: Self-pay | Admitting: Internal Medicine

## 2017-12-23 DIAGNOSIS — F411 Generalized anxiety disorder: Secondary | ICD-10-CM

## 2017-12-23 MED ORDER — BUSPIRONE HCL 15 MG PO TABS
15.0000 mg | ORAL_TABLET | Freq: Three times a day (TID) | ORAL | 1 refills | Status: DC
Start: 2017-12-23 — End: 2018-02-26

## 2017-12-23 NOTE — Telephone Encounter (Signed)
Copied from [place] [id number]. Topic: Inquiry >> Dec 23, 2017  2:52 PM Molly Stone wrote: Reason for CRM: pt is going thru some personal issues and is calling to see if she can increase her intake of  busPIRone (BUSPAR) 7.5 MG tablet [[id number]] or if she can have something for anxiety, pt has a lot coming at her she states and needs to see what she can do to calm herself down, contact pt to advise

## 2017-12-23 NOTE — Telephone Encounter (Signed)
I have increased the buspirone to 15 mg . She can use it up ot 3 times daily   rx sent for 15 mg dose

## 2017-12-23 NOTE — Telephone Encounter (Signed)
Please advise 

## 2017-12-24 NOTE — Telephone Encounter (Signed)
Spoke with pt and informed her of the medication change and that the rx has been sent to [facility] in [place]. Pt gave a verbal understanding.

## 2017-12-30 ENCOUNTER — Encounter: Payer: Self-pay | Admitting: Internal Medicine

## 2018-01-02 ENCOUNTER — Other Ambulatory Visit: Payer: Self-pay | Admitting: Internal Medicine

## 2018-01-02 MED ORDER — OSELTAMIVIR PHOSPHATE 75 MG PO CAPS: 75.0000 mg | ORAL_CAPSULE | Freq: Every day | ORAL | 0 refills | Status: DC

## 2018-01-07 ENCOUNTER — Telehealth: Payer: Self-pay | Admitting: Internal Medicine

## 2018-01-07 MED ORDER — OSELTAMIVIR PHOSPHATE 75 MG PO CAPS: 75.0000 mg | ORAL_CAPSULE | Freq: Two times a day (BID) | ORAL | 0 refills | Status: DC

## 2018-01-07 NOTE — Telephone Encounter (Signed)
Patient notified

## 2018-01-07 NOTE — Telephone Encounter (Signed)
Patient requesting refill on tamiflu, Pt says that she received 10 pills she's been taking 2 a day for 5 days. Pt says that she has 1 more pill left for today.  Per Email you stated ok to refill if taking prophylaxis dose and then develops symptoms patient has been regular dose for flu.

## 2018-01-07 NOTE — Telephone Encounter (Signed)
Tried to reach patient by phone no answer , Need to know if patient took prophylaxis dose the entire 8 days or has she been taking twice daily? PEC may advise.

## 2018-01-07 NOTE — Telephone Encounter (Signed)
done

## 2018-01-07 NOTE — Telephone Encounter (Signed)
Please advise. Thank you

## 2018-01-07 NOTE — Telephone Encounter (Signed)
Pt says that she received 10 pills she's been taking 2 a day for 5 days. Pt says that she has 1 more pill left for today.

## 2018-01-07 NOTE — Telephone Encounter (Signed)
Copied from [place]. Topic: Quick Communication - Rx Refill/Question >> Jan 07, 2018 10:44 AM Cecelia Byars, NT wrote: Medication:  oseltamivir (TAMIFLU) 75 MG capsule  Has the patient contacted their pharmacy? {yes  (Agent: If no, request that the patient contact the pharmacy for the refill.) Preferred Pharmacy (with phone number or street name): [facility], [address] [phone] (Phone) [phone] (Fax Agent: Please be advised that RX refills may take up to 3 business days. We ask that you follow-up with your pharmacy. Per patient email encounter on 12/30/17 with Dr Derrel Nip , patient is calling to update that she is not feeling better and needs a refill on the above medication.

## 2018-02-10 ENCOUNTER — Ambulatory Visit: Payer: Self-pay

## 2018-02-10 DIAGNOSIS — L559 Sunburn, unspecified: Secondary | ICD-10-CM | POA: Diagnosis not present

## 2018-02-10 NOTE — Telephone Encounter (Signed)
Patient called in with c/o "blister from sunburn." She says "about 2-3 days ago I got sunburn in various spots. The main thing is the blister that formed on my left lower leg, shin. It bursted and oozing yellow, clear fluid. It's about the size of a soft ball. I did and e-visit and the doctor scared me when he said to watch for fevers. I haven't had a fever that I know of. I've been washing the blister, applying antibiotic ointment and keeping a dressing on it. The pain is mild, 2-3 and it may be the sunburn around it hurting." She denies any other symptoms, lightheaded and nausea. According to protocol, see PCP within 3 days, no availability with PCP, appointment scheduled for Thursday at 1315 with Almira Coaster, PA, care advice given, patient verbalized understanding.   Reason for Disposition . Blister larger than 1 inch (2.5 cm)  Answer Assessment - Initial Assessment Questions 1. APPEARANCE of SKIN: "What does it look like?" "Where is the sunburn located?"      Fading red 2. BLISTERS: "Are there any blisters?" If so, ask: "Where are they located?" "How big are they?" "Are they closed or broken?"      Yes to left shin 3. EXTENT: "How much of the body has a sunburn?" "How large is the area of blistering?" (can compare to the size of their palm)     Certain spots; both calf muscles, shin, back 4. ONSET: "When did the sun exposure occur?" (Hours or days ago)      2-3 days ago 5. PAIN: "How painful is the sunburn?"  (Scale 1-10; mild, moderate or severe)     2-3 6. OTHER SYMPTOMS: "Do you have any other symptoms?" (e.g., lightheadedness, nausea)    No 7. PREGNANCY: "Is there any chance you are pregnant?" "When was your last menstrual period?"     No  Protocols used: SUNBURN-A-AH

## 2018-02-12 ENCOUNTER — Ambulatory Visit: Payer: Commercial Managed Care - PPO | Admitting: Family Medicine

## 2018-02-12 ENCOUNTER — Encounter: Payer: Self-pay | Admitting: Family Medicine

## 2018-02-12 VITALS — BP 118/68 | HR 95 | Temp 98.0°F | Wt 298.2 lb

## 2018-02-12 DIAGNOSIS — L03115 Cellulitis of right lower limb: Secondary | ICD-10-CM

## 2018-02-12 DIAGNOSIS — L559 Sunburn, unspecified: Secondary | ICD-10-CM | POA: Diagnosis not present

## 2018-02-12 MED ORDER — MUPIROCIN 2 % EX OINT: 1.0000 "application " | TOPICAL_OINTMENT | Freq: Two times a day (BID) | CUTANEOUS | 0 refills | Status: DC

## 2018-02-12 MED ORDER — DOXYCYCLINE HYCLATE 100 MG PO TABS: 100.0000 mg | ORAL_TABLET | Freq: Two times a day (BID) | ORAL | 0 refills | Status: DC

## 2018-02-12 MED ORDER — CEPHALEXIN 500 MG PO CAPS: 500.0000 mg | ORAL_CAPSULE | Freq: Four times a day (QID) | ORAL | 0 refills | Status: DC

## 2018-02-12 NOTE — Progress Notes (Signed)
Subjective:    Patient ID: Molly Stone, female    DOB: 06-05-1981, 37 y.o.   MRN: [id number]  HPI  Molly Stone is a 37 year old female who presents with a sunburn that occurred one week ago. She was recently at the beach and noticed erythema and blistering of her lower right leg. She reported that her dog ruptured a blister on her lower leg and has now develop a sore in this area. Ruptured blistered with associated erythema, and clear/yellow drainage is present. Associated pain with mild edema and warmth have been present. She denies fever, chills, sweats, or myalgias. She reports feeling nauseated as she was worried about this wound. She contacted a "teledoc" through her insurance and was provided instructions to keep area clean and dry, use mupirocin ointment and wrap with a guaze.  She has completed this however did not use nonstick guaze which has caused discomfort.   She did wear sunscreen and reapplied one time during sun exposure at the beach. She reports that she was not in the sun more than one hour however was sunburned on her chest and lower legs.  She reports having a reaction to the "sun" more than 5 years ago that resolved spontaneously.   Review of Systems  Constitutional: Negative for chills, fatigue and fever.  Respiratory: Negative for cough, shortness of breath and wheezing.   Cardiovascular: Negative for chest pain and palpitations.  Gastrointestinal: Negative for abdominal pain, diarrhea, nausea and vomiting.  Genitourinary: Negative for dysuria.  Musculoskeletal: Negative for myalgias.  Skin: Negative for rash.       Ruptured blister and sore on right lower leg  Neurological: Negative for dizziness, weakness and headaches.  Psychiatric/Behavioral:       Denies depressed or anxious mood   Past Medical History:  Diagnosis Date  . Allergy   . Anemia   . Anxiety   . Asthma   . Chlamydia    h/o  . Disorder of vocal cord    spasmodic dysphonia  .  Dysmenorrhea   . Dyspareunia, female   . Endometriosis   . Family history of endometriosis   . GERD (gastroesophageal reflux disease)   . Headache    migraine  . Heavy periods   . History of nephrolithiasis   . Hypertension   . Jaundice, physiologic, newborn   . Obesity (BMI 30-39.9)   . Sleep apnea   . Tobacco user   . Vaginal Pap smear, abnormal      Social History   Socioeconomic History  . Marital status: Married    Spouse name: Not on file  . Number of children: Not on file  . Years of education: Not on file  . Highest education level: Not on file  Occupational History  . Not on file  Social Needs  . Financial resource strain: Not on file  . Food insecurity:    Worry: Not on file    Inability: Not on file  . Transportation needs:    Medical: Not on file    Non-medical: Not on file  Tobacco Use  . Smoking status: Former Smoker    Types: Cigarettes    Last attempt to quit: 10/2016    Years since quitting: 1.3  . Smokeless tobacco: Never Used  Substance and Sexual Activity  . Alcohol use: Yes    Alcohol/week: 0.0 oz    Comment: wine occassionally  . Drug use: No  . Sexual activity: Yes    Comment: vasectomy  Lifestyle  . Physical activity:    Days per week: Not on file    Minutes per session: Not on file  . Stress: Not on file  Relationships  . Social connections:    Talks on phone: Not on file    Gets together: Not on file    Attends religious service: Not on file    Active member of club or organization: Not on file    Attends meetings of clubs or organizations: Not on file    Relationship status: Not on file  . Intimate partner violence:    Fear of current or ex partner: Not on file    Emotionally abused: Not on file    Physically abused: Not on file    Forced sexual activity: Not on file  Other Topics Concern  . Not on file  Social History Narrative  . Not on file    Past Surgical History:  Procedure Laterality Date  . DILATION AND  CURETTAGE OF UTERUS    . laparoscopy    . WISDOM TOOTH EXTRACTION      Family History  Problem Relation Age of Onset  . Endometriosis Mother   . Cancer Father        benign liver CA  . Stroke Father        recurrent blood clots on the brain   . Cancer Maternal Grandmother 30       ovarian Ca, still surviving   . Osteoporosis Maternal Grandmother   . Ovarian cancer Maternal Grandmother   . Heart disease Maternal Grandfather   . Cancer Maternal Grandfather 66       colon CA. metastatic  . Diabetes Maternal Uncle   . Heart disease Paternal Grandmother   . Breast cancer Neg Hx   . Colon cancer Neg Hx   . Kidney cancer Neg Hx   . Bladder Cancer Neg Hx     Allergies  Allergen Reactions  . Morphine And Related Itching    And shaking  . Tramadol Hcl     anxious  . Buprenorphine Hcl Itching    And shaking  . Prednisone Anxiety    Current Outpatient Medications on File Prior to Visit  Medication Sig Dispense Refill  . albuterol (PROVENTIL HFA;VENTOLIN HFA) 108 (90 Base) MCG/ACT inhaler Inhale 2 puffs into the lungs every 6 (six) hours as needed for wheezing or shortness of breath. 18 g 3  . budesonide-formoterol (SYMBICORT) 160-4.5 MCG/ACT inhaler Inhale 2 puffs into the lungs 2 (two) times daily. 1 Inhaler 5  . busPIRone (BUSPAR) 15 MG tablet Take 1 tablet (15 mg total) by mouth 3 (three) times daily. 90 tablet 1  . fexofenadine-pseudoephedrine (ALLEGRA-D) 60-120 MG 12 hr tablet Take by mouth.    . fluticasone (FLONASE) 50 MCG/ACT nasal spray Place 1 spray into both nostrils daily. (Patient taking differently: Place 2 sprays into both nostrils daily. ) 15.8 g 5  . montelukast (SINGULAIR) 10 MG tablet TAKE 1 TABLET AT BEDTIME 30 tablet 3  . Multiple Vitamin (MULTI-VITAMINS) TABS Take 1 tablet by mouth daily.     . Multiple Vitamin (MULTIVITAMIN) capsule Take by mouth.    Marland Kitchen omeprazole (PRILOSEC) 20 MG capsule Take 1 capsule (20 mg total) by mouth daily. 90 capsule 1   Current  Facility-Administered Medications on File Prior to Visit  Medication Dose Route Frequency Provider Last Rate Last Dose  . methacholine (PROVOCHOLINE) inhaler solution 8 mg  2 mL Inhalation Once Crecencio Mc, MD  Followed by  . methacholine (PROVOCHOLINE) inhaler solution 32 mg  2 mL Inhalation Once Crecencio Mc, MD       Followed by  . albuterol (PROVENTIL) (2.5 MG/3ML) 0.083% nebulizer solution 2.5 mg  2.5 mg Nebulization Once Crecencio Mc, MD        BP 118/68 (BP Location: Left Arm, Patient Position: Sitting, Cuff Size: Large)   Pulse 95   Temp 98 F (36.7 C) (Oral)   Wt 298 lb 4 oz (135.3 kg)   SpO2 96%   BMI 45.35 kg/m       Objective:   Physical Exam  Constitutional: She is oriented to person, place, and time. She appears well-developed and well-nourished.  Eyes: Pupils are equal, round, and reactive to light. No scleral icterus.  Neck: Neck supple.  Cardiovascular: Normal rate, regular rhythm and intact distal pulses.  Pulmonary/Chest: Effort normal and breath sounds normal. She has no wheezes. She has no rales.  Abdominal: Soft. Bowel sounds are normal. There is no tenderness.  Musculoskeletal: She exhibits no edema.  Lymphadenopathy:    She has no cervical adenopathy.  Neurological: She is alert and oriented to person, place, and time.  Skin: Skin is warm and dry. No rash noted.  Erythema with scaling present on chest without vesiculation.  Erythema on lower legs bilaterally. Ruptured blistered on right lower leg approximately 3 cm x 3 cm with surrounding erythema, warmth, and clear/yellow drainage present  Psychiatric: She has a normal mood and affect. Her behavior is normal. Judgment and thought content normal.      Assessment & Plan:  1. Cellulitis of right lower extremity Exam and history are most consistent with cellulitis that developed after dog ruptured blister on right lower leg. Will initiate a course of cephalexin and advised patient to keep  area clean with soap/water, apply mupirocin, wrap with Telfa, and follow up if symptoms do not improve with treatment, worsen such as increasing erythema/red streaks from area, or fever develops.   - mupirocin ointment (BACTROBAN) 2 %; Place 1 application into the nose 2 (two) times daily.  Dispense: 22 g; Refill: 0 - cephALEXin (KEFLEX) 500 MG capsule; Take 1 capsule (500 mg total) by mouth 4 (four) times daily.  Dispense: 20 capsule; Refill: 0  2. Sunburn Healing; we reviewed the importance of using sunscreen and avoiding prolonged sun exposure. Further discussed the importance of hydration. She voiced understanding and agreed with plan.  Delano Metz, FNP-C

## 2018-02-12 NOTE — Patient Instructions (Signed)
Please take medication as directed.  Keep area clean, wash with soap/water, apply mupirocin, and cover with nonstick guaze.  If symptoms do not improve with treatment, worsen, or you develop fever or red streaks from the area, please follow up for further evaluation and treatment.   Cellulitis, Adult Cellulitis is a skin infection. The infected area is usually red and sore. This condition occurs most often in the arms and lower legs. It is very important to get treated for this condition. Follow these instructions at home:  Take over-the-counter and prescription medicines only as told by your doctor.  If you were prescribed an antibiotic medicine, take it as told by your doctor. Do not stop taking the antibiotic even if you start to feel better.  Drink enough fluid to keep your pee (urine) clear or pale yellow.  Do not touch or rub the infected area.  Raise (elevate) the infected area above the level of your heart while you are sitting or lying down.  Place warm or cold wet cloths (warm or cold compresses) on the infected area. Do this as told by your doctor.  Keep all follow-up visits as told by your doctor. This is important. These visits let your doctor make sure your infection is not getting worse. Contact a doctor if:  You have a fever.  Your symptoms do not get better after 1-2 days of treatment.  Your bone or joint under the infected area starts to hurt after the skin has healed.  Your infection comes back. This can happen in the same area or another area.  You have a swollen bump in the infected area.  You have new symptoms.  You feel ill and also have muscle aches and pains. Get help right away if:  Your symptoms get worse.  You feel very sleepy.  You throw up (vomit) or have watery poop (diarrhea) for a long time.  There are red streaks coming from the infected area.  Your red area gets larger.  Your red area turns darker. This information is not intended  to replace advice given to you by your health care provider. Make sure you discuss any questions you have with your health care provider. Document Released: 03/25/2008 Document Revised: 03/14/2016 Document Reviewed: 08/16/2015 Elsevier Interactive Patient Education  2018 [employer].

## 2018-02-17 ENCOUNTER — Ambulatory Visit: Payer: Self-pay | Admitting: *Deleted

## 2018-02-17 ENCOUNTER — Encounter: Payer: Self-pay | Admitting: Internal Medicine

## 2018-02-17 NOTE — Telephone Encounter (Signed)
FYI

## 2018-02-17 NOTE — Telephone Encounter (Signed)
Pt called asking how long should it take for the redness to completely go away from her dx of cellulitis. Pt states that the redness has decreased some but not completely. The pain is better in her legs but sometimes when she is walking, her right leg may hurt a little. Denies fever, or streaking or drainage. Advised pt to finish her antibiotic and if she doesn't see the redness fading by the end of the week to give [place] a call back or have fever and more pain.  She voiced understanding. She thinks that she can quickly get a reaction to the sun. Which it happened a few years ago and it was on her breast. That cleared up then.   Per protocol she will cont to treat this at home.   Reason for Disposition . [1] Taking antibiotic AND [2] cellulitis symptoms are BETTER AND [3] no fever  Answer Assessment - Initial Assessment Questions 1. SYMPTOM: "What's the main symptom you're concerned about?" (e.g., redness, swelling, pain, fever, weakness)     Pt had a question regarding the redness 2. CELLULITIS LOCATION: "Where is the cellulitis  located?" (e.g., hand, arm, foot, leg, face)     Both legs but the right one is worst than the left one  4. BETTER-SAME-WORSE: "Are you getting better, staying the same, or getting worse compared to the day you started the antibiotics?"   5.  PAIN: Do you have any pain?"  If so, "How bad is the pain?"  (e.g., Scale 1-10; mild, moderate, or severe)    - MILD (1-3): doesn't interfere with normal activities     - MODERATE (4-7): interferes with normal activities or awakens from sleep    - SEVERE (8-10): excruciating pain, unable to do any normal activities       Better, pain not as bad now 6.  FEVER: "Do you have a fever?" If so, ask: "What is it, how was it measured and when did it start?"     no 7. OTHER SYMPTOMS: "Do you have any other symptoms?" (e.g., pus coming from a wound, red streaks, weakness) 8.  DIAGNOSIS DATE: "When was the cellulitis diagnosed?" "By whom?"   9.  ANTIBIOTIC NAME: "What antibiotic(s) are you taking?"  "How many times per day?" (Be sure the patient is receiving the antibiotic as directed).      Cephalexin, doxycline hyclate 10. ANTIBIOTIC DATE: "When was the antibiotic started?"       Last Thursday 11. FOLLOW-UP APPOINTMENT: "Do you have follow-up appointment with your doctor?"       no  Protocols used: CELLULITIS ON ANTIBIOTIC FOLLOW-UP CALL-A-AH

## 2018-02-17 NOTE — Telephone Encounter (Signed)
FYI. Also it looks like the patient sent a mychart message to Dr. Derrel Nip today as well.

## 2018-02-26 ENCOUNTER — Other Ambulatory Visit: Payer: Self-pay | Admitting: Internal Medicine

## 2018-02-26 DIAGNOSIS — F411 Generalized anxiety disorder: Secondary | ICD-10-CM

## 2018-03-14 DIAGNOSIS — B029 Zoster without complications: Secondary | ICD-10-CM | POA: Diagnosis not present

## 2018-03-17 ENCOUNTER — Encounter: Payer: Self-pay | Admitting: Family Medicine

## 2018-03-17 ENCOUNTER — Ambulatory Visit: Payer: Commercial Managed Care - PPO | Admitting: Family Medicine

## 2018-03-17 VITALS — BP 124/68 | HR 99 | Temp 99.1°F | Wt 300.0 lb

## 2018-03-17 DIAGNOSIS — B029 Zoster without complications: Secondary | ICD-10-CM | POA: Diagnosis not present

## 2018-03-17 NOTE — Progress Notes (Signed)
Patient ID: Molly Stone, female   DOB: [birth year], 37 y.o.   MRN: [id number]   PCP: Crecencio Mc, MD  Subjective:  Molly Stone is a 37 y.o. year old very pleasant female patient who presents with a Rash: Initial distribution: Left shoulder and upper left back Prior history of this rash: No Discomfort associated: Mild burning intermittently Associated symptoms:  Mild pruritus  Change in rash:  Started with one "bump that looked like a pimple" Denies:  Fever, chills, sweats, myalgias, difficulty swallowing, hoarseness, SOB, N/V Abdominal pain, or tightening throat. No new exposures of soaps, lotions, laundry detergent, fabric softeners, foods, medications, herbal supplements, STDs, plants, animals, or insects.  -started: 5 days ago, symptoms are improving -previous treatments: Valtrex has provided benefit -sick contacts/travel/risks: denies flu exposure. No recent sick contact exposure.  -Hx of: allergies She contacted a provider (teledoc) through her work 4 days ago who provided valtrex 1000 mg TID x  7 days.   ROS-denies fever, SOB, NVD  Pertinent Past Medical History- Asthma, OSA, history of chicken pox twice per patient as a child  Medications- reviewed  Current Outpatient Medications  Medication Sig Dispense Refill  . albuterol (PROVENTIL HFA;VENTOLIN HFA) 108 (90 Base) MCG/ACT inhaler Inhale 2 puffs into the lungs every 6 (six) hours as needed for wheezing or shortness of breath. 18 g 3  . budesonide-formoterol (SYMBICORT) 160-4.5 MCG/ACT inhaler Inhale 2 puffs into the lungs 2 (two) times daily. 1 Inhaler 5  . busPIRone (BUSPAR) 15 MG tablet TAKE 1 TABLET(15 MG) BY MOUTH THREE TIMES DAILY 90 tablet 1  . cetirizine (ZYRTEC) 10 MG tablet Take 10 mg by mouth daily.    . fexofenadine-pseudoephedrine (ALLEGRA-D) 60-120 MG 12 hr tablet Take by mouth.    . fluticasone (FLONASE) 50 MCG/ACT nasal spray Place 1 spray into both nostrils daily. (Patient taking differently:  Place 2 sprays into both nostrils daily. ) 15.8 g 5  . montelukast (SINGULAIR) 10 MG tablet TAKE 1 TABLET AT BEDTIME 30 tablet 3  . Multiple Vitamin (MULTI-VITAMINS) TABS Take 1 tablet by mouth daily.     . Multiple Vitamin (MULTIVITAMIN) capsule Take by mouth.    . mupirocin ointment (BACTROBAN) 2 % Place 1 application into the nose 2 (two) times daily. 22 g 0  . omeprazole (PRILOSEC) 20 MG capsule Take 1 capsule (20 mg total) by mouth daily. 90 capsule 1  . valACYclovir (VALTREX) 1000 MG tablet TK 1 T PO TID FOR 1 WK  0   No current facility-administered medications for this visit.    Facility-Administered Medications Ordered in Other Visits  Medication Dose Route Frequency Provider Last Rate Last Dose  . methacholine (PROVOCHOLINE) inhaler solution 8 mg  2 mL Inhalation Once Crecencio Mc, MD       Followed by  . methacholine (PROVOCHOLINE) inhaler solution 32 mg  2 mL Inhalation Once Crecencio Mc, MD       Followed by  . albuterol (PROVENTIL) (2.5 MG/3ML) 0.083% nebulizer solution 2.5 mg  2.5 mg Nebulization Once Crecencio Mc, MD        Objective: BP 124/68 (BP Location: Left Arm, Patient Position: Sitting, Cuff Size: Large)   Pulse (!) 110   Temp 99.1 F (37.3 C) (Oral)   Wt 300 lb (136.1 kg)   SpO2 98%   BMI 45.61 kg/m  Retake of Pulse: 99; Temp: 98  Gen: NAD, resting comfortably HEENT: Oropharynx is clear and moist CV: RRR no murmurs rubs or gallops  Lungs: CTAB no crackles, wheeze, rhonchi Abdomen: soft/nontender/nondistended/normal bowel sounds. No rebound or guarding.  Ext: no edema Skin: warm, dry; erythematous papules in a dermatomal pattern approximately at T1 that does not cross the midline with areas that are crusted. No new vesicles present.  Neuro: grossly normal, moves all extremities  Assessment/Plan: 1. Herpes zoster without complication Symptoms are most consistent with herpes zoster that is improving. She is taking Valtrex as prescribed without  adverse effects per patient. Advised her to continue Valtrex and provided return precautions.   Finally, we reviewed reasons to return to care including if symptoms worsen or persist or new concerns arise- once again particularly rash that does not improve,  shortness of breath, N/V, or fever. Further advised patient to keep a diary of food choices and also use dye/scent free products as she reports history of eczema like rash that is not present today..  Reviewed importance of calling 911immediately if symptoms of SOB, difficulty swallowing, or throat tightening.   Laurita Quint, FNP

## 2018-03-17 NOTE — Patient Instructions (Addendum)
Please complete Valtrex as directed by your previous provider.  If symptoms do not improve, worsen, or new symptoms develop, please follow up for further evaluation.   Shingles Shingles is an infection that causes a painful skin rash and fluid-filled blisters. Shingles is caused by the same virus that causes chickenpox. Shingles only develops in people who:  Have had chickenpox.  Have gotten the chickenpox vaccine. (This is rare.)  The first symptoms of shingles may be itching, tingling, or pain in an area on your skin. A rash will follow in a few days or weeks. The rash is usually on one side of the body in a bandlike or beltlike pattern. Over time, the rash turns into fluid-filled blisters that break open, scab over, and dry up. Medicines may:  Help you manage pain.  Help you recover more quickly.  Help to prevent long-term problems.  Follow these instructions at home: Medicines  Take medicines only as told by your doctor.  Apply an anti-itch or numbing cream to the affected area as told by your doctor. Blister and Rash Care  Take a cool bath or put cool compresses on the area of the rash or blisters as told by your doctor. This may help with pain and itching.  Keep your rash covered with a loose bandage (dressing). Wear loose-fitting clothing.  Keep your rash and blisters clean with mild soap and cool water or as told by your doctor.  Check your rash every day for signs of infection. These include redness, swelling, and pain that lasts or gets worse.  Do not pick your blisters.  Do not scratch your rash. General instructions  Rest as told by your doctor.  Keep all follow-up visits as told by your doctor. This is important.  Until your blisters scab over, your infection can cause chickenpox in people who have never had it or been vaccinated against it. To prevent this from happening, avoid touching other people or being around other people,  especially: ? Babies. ? Pregnant women. ? Children who have eczema. ? Elderly people who have transplants. ? People who have chronic illnesses, such as leukemia or AIDS. Contact a doctor if:  Your pain does not get better with medicine.  Your pain does not get better after the rash heals.  Your rash looks infected. Signs of infection include: ? Redness. ? Swelling. ? Pain that lasts or gets worse. Get help right away if:  The rash is on your face or nose.  You have pain in your face, pain around your eye area, or loss of feeling on one side of your face.  You have ear pain or you have ringing in your ear.  You have loss of taste.  Your condition gets worse. This information is not intended to replace advice given to you by your health care provider. Make sure you discuss any questions you have with your health care provider. Document Released: 03/25/2008 Document Revised: 06/02/2016 Document Reviewed: 07/19/2014 Elsevier Interactive Patient Education  [employer].

## 2018-03-18 ENCOUNTER — Encounter: Payer: Self-pay | Admitting: Family Medicine

## 2018-03-25 ENCOUNTER — Ambulatory Visit: Payer: Commercial Managed Care - PPO | Admitting: Internal Medicine

## 2018-03-25 ENCOUNTER — Encounter: Payer: Self-pay | Admitting: Internal Medicine

## 2018-03-25 DIAGNOSIS — M25512 Pain in left shoulder: Secondary | ICD-10-CM

## 2018-03-25 MED ORDER — DICLOFENAC SODIUM 75 MG PO TBEC: 75.0000 mg | DELAYED_RELEASE_TABLET | Freq: Two times a day (BID) | ORAL | 0 refills | Status: DC

## 2018-03-25 NOTE — Patient Instructions (Signed)
Diclofenac 75 mg twice daily for 2 weeks  Ok to add tylenol up to 2000 mg daily if needed (in divided doses)   If no improvement,  Referral to sports medicine for rule out of rotator cuff syndrome
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
## 2018-03-26 ENCOUNTER — Encounter: Payer: Self-pay | Admitting: Internal Medicine

## 2018-03-26 DIAGNOSIS — M25512 Pain in left shoulder: Secondary | ICD-10-CM | POA: Insufficient documentation

## 2018-03-26 NOTE — Assessment & Plan Note (Signed)
Trapeziums muscle strain vs rotator cuff tendonitis.  NSAID,  Rest 2 weeks.

## 2018-04-09 ENCOUNTER — Encounter: Payer: Self-pay | Admitting: Internal Medicine

## 2018-04-09 DIAGNOSIS — M545 Low back pain: Secondary | ICD-10-CM | POA: Diagnosis not present

## 2018-04-10 ENCOUNTER — Telehealth: Payer: Self-pay

## 2018-04-10 NOTE — Telephone Encounter (Signed)
Copied from [id number]. Topic: Appointment Scheduling - Scheduling Inquiry for Clinic >> Apr 10, 2018  3:06 PM Scherrie Gerlach wrote: Reason for CRM: pt sent a mychart message and Dr Derrel Nip advised her to make an appt. Pt has been trying for 2 days to schedule. Can she have the 4:30 appt on Monday? I cannot override, because technically it is not really the day before. Thank you!!!

## 2018-04-10 NOTE — Telephone Encounter (Signed)
Spoke with pt and scheduled her for Monday 04/14/2018 at 11am okay per Dr. Derrel Nip.

## 2018-04-13 ENCOUNTER — Encounter: Payer: Self-pay | Admitting: Internal Medicine

## 2018-04-13 ENCOUNTER — Ambulatory Visit: Payer: Commercial Managed Care - PPO | Admitting: Internal Medicine

## 2018-04-13 VITALS — BP 120/86 | HR 94 | Temp 98.3°F | Resp 15 | Ht 68.0 in | Wt 302.0 lb

## 2018-04-13 DIAGNOSIS — R1031 Right lower quadrant pain: Secondary | ICD-10-CM | POA: Diagnosis not present

## 2018-04-13 DIAGNOSIS — Z87898 Personal history of other specified conditions: Secondary | ICD-10-CM

## 2018-04-13 DIAGNOSIS — R350 Frequency of micturition: Secondary | ICD-10-CM

## 2018-04-13 LAB — POCT URINALYSIS DIPSTICK
BILIRUBIN UA: NEGATIVE
Blood, UA: NEGATIVE
Glucose, UA: NEGATIVE
KETONES UA: NEGATIVE
Leukocytes, UA: NEGATIVE
NITRITE UA: NEGATIVE
PROTEIN UA: NEGATIVE
Spec Grav, UA: <=1.005 — AB (ref 1.010–1.025)
Urobilinogen, UA: 0.2 E.U./dL
pH, UA: 6.5 (ref 5.0–8.0)

## 2018-04-13 LAB — CBC WITH DIFFERENTIAL/PLATELET
BASOS ABS: 0 10*3/uL (ref 0.0–0.1)
Basophils Relative: 0.8 % (ref 0.0–3.0)
Eosinophils Absolute: 0.4 10*3/uL (ref 0.0–0.7)
Eosinophils Relative: 8.3 % — ABNORMAL HIGH (ref 0.0–5.0)
HCT: 41.7 % (ref 36.0–46.0)
Hemoglobin: 14.2 g/dL (ref 12.0–15.0)
LYMPHS ABS: 2.2 10*3/uL (ref 0.7–4.0)
Lymphocytes Relative: 40.8 % (ref 12.0–46.0)
MCHC: 34 g/dL (ref 30.0–36.0)
MCV: 89.9 fl (ref 78.0–100.0)
MONO ABS: 0.6 10*3/uL (ref 0.1–1.0)
Monocytes Relative: 10.8 % (ref 3.0–12.0)
NEUTROS PCT: 39.3 % — AB (ref 43.0–77.0)
Neutro Abs: 2.1 10*3/uL (ref 1.4–7.7)
Platelets: 189 10*3/uL (ref 150.0–400.0)
RBC: 4.64 Mil/uL (ref 3.87–5.11)
RDW: 14.1 % (ref 11.5–15.5)
WBC: 5.4 10*3/uL (ref 4.0–10.5)

## 2018-04-13 LAB — SEDIMENTATION RATE: Sed Rate: 24 mm/hr — ABNORMAL HIGH (ref 0–20)

## 2018-04-13 LAB — URINALYSIS, MICROSCOPIC ONLY: RBC / HPF: NONE SEEN (ref 0–?)

## 2018-04-13 MED ORDER — TAMSULOSIN HCL 0.4 MG PO CAPS: 0.4000 mg | ORAL_CAPSULE | Freq: Every day | ORAL | 0 refills | Status: DC

## 2018-04-13 NOTE — Patient Instructions (Addendum)
Labs today to look for ongoing inflammation/infection    The next time you have episode of flank pain,  Bring me a urine sample using the sterile  Container, and start straining your urine    If there is blood in it without infection , this suggests a stone,  And you can start the Flomax  To relax the ureter.   If the pain does not improve after 24-48 hours,  We will get a CT

## 2018-04-13 NOTE — Progress Notes (Signed)
Subjective:  Patient ID: Molly Stone, female    DOB: 1981-03-02  Age: 37 y.o. MRN: [id number]  CC: The encounter diagnosis was Urinary frequency.  HPI Molly Stone presents for evaluation of episodic  Right flank pain ,  Right sided abdominal pain radiating to mid back and increased urinary frequency that has now resolved since she made the appointment .   This is her 6th visit in 9 months.   She has a istory of renal stones,  Saw urology in Oct 2018. Renal ultrasound was done,  No stones were seen but right kidney was  smaller (suggestive of RAS?")  However she has no history of hypertension .  She denies hematuria nausea , dysuria.   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  She  is blaming her children for her "stress" and making disparaging remarks about her children.  All appear sullen and bored.   Outpatient Medications Prior to Visit  Medication Sig Dispense Refill  . albuterol (PROVENTIL HFA;VENTOLIN HFA) 108 (90 Base) MCG/ACT inhaler Inhale 2 puffs into the lungs every 6 (six) hours as needed for wheezing or shortness of breath. 18 g 3  . budesonide-formoterol (SYMBICORT) 160-4.5 MCG/ACT inhaler Inhale 2 puffs into the lungs 2 (two) times daily. 1 Inhaler 5  . busPIRone (BUSPAR) 15 MG tablet TAKE 1 TABLET(15 MG) BY MOUTH THREE TIMES DAILY 90 tablet 1  . cetirizine (ZYRTEC) 10 MG tablet Take 10 mg by mouth daily.    . diclofenac (VOLTAREN) 75 MG EC tablet Take 1 tablet (75 mg total) by mouth 2 (two) times daily. 60 tablet 0  . fexofenadine-pseudoephedrine (ALLEGRA-D) 60-120 MG 12 hr tablet Take by mouth.    . fluticasone (FLONASE) 50 MCG/ACT nasal spray Place 1 spray into both nostrils daily. (Patient taking differently: Place 2 sprays into both nostrils daily. ) 15.8 g 5  . montelukast (SINGULAIR) 10 MG tablet TAKE 1 TABLET AT BEDTIME 30 tablet 3  . Multiple Vitamin (MULTIVITAMIN) capsule Take by mouth.    . mupirocin  ointment (BACTROBAN) 2 % Place 1 application into the nose 2 (two) times daily. 22 g 0  . omeprazole (PRILOSEC) 20 MG capsule Take 1 capsule (20 mg total) by mouth daily. 90 capsule 1  . Multiple Vitamin (MULTI-VITAMINS) TABS Take 1 tablet by mouth daily.      Facility-Administered Medications Prior to Visit  Medication Dose Route Frequency Provider Last Rate Last Dose  . methacholine (PROVOCHOLINE) inhaler solution 8 mg  2 mL Inhalation Once Crecencio Mc, MD       Followed by  . methacholine (PROVOCHOLINE) inhaler solution 32 mg  2 mL Inhalation Once Crecencio Mc, MD       Followed by  . albuterol (PROVENTIL) (2.5 MG/3ML) 0.083% nebulizer solution 2.5 mg  2.5 mg Nebulization Once Crecencio Mc, MD        Review of Systems;  Patient denies headache, fevers, malaise, unintentional weight loss, skin rash, eye pain, sinus congestion and sinus pain, sore throat, dysphagia,  hemoptysis , cough, dyspnea, wheezing, chest pain, palpitations, orthopnea, edema, abdominal pain, nausea, melena, diarrhea, constipation, flank pain, dysuria, hematuria, urinary  Frequency, nocturia, numbness, tingling, seizures,  Focal weakness, Loss of consciousness,  Tremor, insomnia, depression, , and suicidal ideation.      Objective:  BP 120/86 (BP Location: Left Arm, Patient Position: Sitting, Cuff Size: Normal)   Pulse 94   Temp 98.3 F (  36.8 C) (Oral)   Resp 15   Ht 5\' 8"  (1.727 m)   Wt (!) 302 lb (137 kg)   SpO2 97%   BMI 45.92 kg/m   BP Readings from Last 3 Encounters:  04/13/18 120/86  03/25/18 106/70  03/17/18 124/68    Wt Readings from Last 3 Encounters:  04/13/18 (!) 302 lb (137 kg)  03/25/18 (!) 300 lb 6.4 oz (136.3 kg)  03/17/18 300 lb (136.1 kg)    General appearance: alert, anxious, cooperative and appears stated age Back: symmetric, no curvature. ROM normal. No CVA tenderness. Lungs: clear to auscultation bilaterally Heart: regular rate and rhythm, S1, S2 normal, no murmur,  click, rub or gallop Abdomen: soft, non-tender; bowel sounds normal; no masses,  no organomegaly Pulses: 2+ and symmetric Skin: Skin color, texture, turgor normal. No rashes or lesions Lymph nodes: Cervical, supraclavicular, and axillary nodes normal.  No results found for: HGBA1C  Lab Results  Component Value Date   CREATININE 0.81 07/27/2017   CREATININE 0.69 03/26/2017   CREATININE 0.75 07/23/2016    Lab Results  Component Value Date   WBC 6.5 07/27/2017   HGB 14.8 07/27/2017   HCT 43.1 07/27/2017   PLT 168 07/27/2017   GLUCOSE 104 (H) 07/27/2017   CHOL 195 07/23/2016   TRIG 105.0 07/23/2016   HDL 40.90 07/23/2016   LDLDIRECT 155 (H) 08/18/2015   LDLCALC 133 (H) 07/23/2016   ALT 24 07/27/2017   AST 23 07/27/2017   NA 135 07/27/2017   K 3.6 07/27/2017   CL 104 07/27/2017   CREATININE 0.81 07/27/2017   BUN 12 07/27/2017   CO2 24 07/27/2017   TSH 0.75 07/23/2016    US Renal  Result Date: 08/19/2017 CLINICAL DATA:  Microscopic hematuria. One month history of right flank pain EXAM: RENAL / URINARY TRACT ULTRASOUND COMPLETE COMPARISON:  CT abdomen and pelvis July 27, 2017 FINDINGS: Right Kidney: Length: 9.9 cm. Echogenicity and renal cortical thickness are within normal limits. No mass, perinephric fluid, or hydronephrosis visualized. No sonographically demonstrable calculus or ureterectasis. Left Kidney: Length: 12.2 cm. Echogenicity and renal cortical thickness are within normal limits. No mass, perinephric fluid, or hydronephrosis visualized. No sonographically demonstrable calculus or ureterectasis. Bladder: Appears normal for degree of bladder distention. IMPRESSION: 1. Currently no hydronephrosis on either side. No renal calculi identified. Kidneys appear normal bilaterally. 2. Right kidney smaller than left kidney. Clinical significance of this finding is uncertain. This discrepancy in size could be indicative of a degree of renal artery stenosis on the right. In this  regard, question whether patient is hypertensive. Electronically Signed   By: Lowella Grip III M.D.   On: 08/19/2017 08:25    Assessment & Plan:   Problem List Items Addressed This Visit    None    Visit Diagnoses    Urinary frequency    -  Primary   Relevant Orders   POCT Urinalysis Dipstick (Completed)   Urine Culture   Urine Microscopic Only      I have discontinued Haly M. Masini's MULTI-VITAMINS. I am also having her maintain her albuterol, budesonide-formoterol, fluticasone, fexofenadine-pseudoephedrine, multivitamin, montelukast, omeprazole, mupirocin ointment, busPIRone, cetirizine, and diclofenac.  No orders of the defined types were placed in this encounter.   Medications Discontinued During This Encounter  Medication Reason  . Multiple Vitamin (MULTI-VITAMINS) TABS Duplicate    Follow-up: No follow-ups on file.   Crecencio Mc, MD

## 2018-04-14 ENCOUNTER — Ambulatory Visit
Admission: RE | Admit: 2018-04-14 | Discharge: 2018-04-14 | Disposition: A | Discharge status: Home / Self Care | Payer: Commercial Managed Care - PPO | Source: Ambulatory Visit | Attending: Internal Medicine | Admitting: Internal Medicine

## 2018-04-14 DIAGNOSIS — N83201 Unspecified ovarian cyst, right side: Secondary | ICD-10-CM | POA: Insufficient documentation

## 2018-04-14 DIAGNOSIS — R1031 Right lower quadrant pain: Secondary | ICD-10-CM | POA: Diagnosis not present

## 2018-04-14 DIAGNOSIS — Z87898 Personal history of other specified conditions: Secondary | ICD-10-CM | POA: Insufficient documentation

## 2018-04-14 LAB — URINE CULTURE
MICRO NUMBER: 90751487
RESULT: NO GROWTH
SPECIMEN QUALITY: ADEQUATE

## 2018-04-14 MED ORDER — IOPAMIDOL (ISOVUE-300) INJECTION 61%
100.0000 mL | Freq: Once | INTRAVENOUS | Status: AC | PRN
  Administered 2018-04-14: 100 mL via INTRAVENOUS

## 2018-04-14 NOTE — Assessment & Plan Note (Addendum)
Intermittent, and recurrent   With no signs of infection on current UA, no hematuria,  And negative renal ultrasound in October 2018. Marland Kitchen However, she has an elevated ESR .  CT abd pelvis ordered to rule out appendicitis,  ovarian cyst,  Other etiologies not seen on ultrasound.  advised to strain urine and start flomax if pain becomes colicky or otherwise reminiscent of calculi.

## 2018-04-20 ENCOUNTER — Encounter: Payer: Self-pay | Admitting: Internal Medicine

## 2018-04-22 ENCOUNTER — Encounter: Payer: Self-pay | Admitting: Obstetrics and Gynecology

## 2018-05-03 ENCOUNTER — Other Ambulatory Visit: Payer: Self-pay | Admitting: Internal Medicine

## 2018-05-12 ENCOUNTER — Encounter: Payer: Self-pay | Admitting: Obstetrics and Gynecology

## 2018-05-18 ENCOUNTER — Ambulatory Visit: Payer: Commercial Managed Care - PPO | Admitting: Internal Medicine

## 2018-06-01 DIAGNOSIS — J385 Laryngeal spasm: Secondary | ICD-10-CM | POA: Diagnosis not present

## 2018-06-01 DIAGNOSIS — K219 Gastro-esophageal reflux disease without esophagitis: Secondary | ICD-10-CM | POA: Diagnosis not present

## 2018-06-02 ENCOUNTER — Other Ambulatory Visit: Payer: Self-pay | Admitting: Internal Medicine

## 2018-06-09 ENCOUNTER — Other Ambulatory Visit: Payer: Self-pay

## 2018-06-09 ENCOUNTER — Emergency Department
Admission: EM | Admit: 2018-06-09 | Discharge: 2018-06-09 | Disposition: A | Discharge status: Home / Self Care | Payer: Commercial Managed Care - PPO | Attending: Emergency Medicine | Admitting: Emergency Medicine

## 2018-06-09 ENCOUNTER — Encounter: Payer: Self-pay | Admitting: Emergency Medicine

## 2018-06-09 DIAGNOSIS — I10 Essential (primary) hypertension: Secondary | ICD-10-CM | POA: Diagnosis not present

## 2018-06-09 DIAGNOSIS — Z79899 Other long term (current) drug therapy: Secondary | ICD-10-CM | POA: Diagnosis not present

## 2018-06-09 DIAGNOSIS — R609 Edema, unspecified: Secondary | ICD-10-CM | POA: Insufficient documentation

## 2018-06-09 DIAGNOSIS — R600 Localized edema: Secondary | ICD-10-CM | POA: Diagnosis not present

## 2018-06-09 DIAGNOSIS — J45909 Unspecified asthma, uncomplicated: Secondary | ICD-10-CM | POA: Insufficient documentation

## 2018-06-09 LAB — BASIC METABOLIC PANEL
Anion gap: 5 (ref 5–15)
BUN: 14 mg/dL (ref 6–20)
CALCIUM: 8.5 mg/dL — AB (ref 8.9–10.3)
CO2: 22 mmol/L (ref 22–32)
Chloride: 110 mmol/L (ref 98–111)
Creatinine, Ser: 0.62 mg/dL (ref 0.44–1.00)
GFR calc Af Amer: >60 mL/min (ref >60)
GFR calc non Af Amer: >60 mL/min (ref >60)
Glucose, Bld: 96 mg/dL (ref 70–99)
Potassium: 4 mmol/L (ref 3.5–5.1)
Sodium: 137 mmol/L (ref 135–145)

## 2018-06-09 LAB — CBC WITH DIFFERENTIAL/PLATELET
BASOS PCT: 1 %
Basophils Absolute: 0 10*3/uL (ref 0–0.1)
Eosinophils Absolute: 0.4 10*3/uL (ref 0–0.7)
Eosinophils Relative: 8 %
HEMATOCRIT: 38.8 % (ref 35.0–47.0)
Hemoglobin: 13.2 g/dL (ref 12.0–16.0)
Lymphocytes Relative: 36 %
Lymphs Abs: 1.9 10*3/uL (ref 1.0–3.6)
MCH: 30.6 pg (ref 26.0–34.0)
MCHC: 34.1 g/dL (ref 32.0–36.0)
MCV: 89.6 fL (ref 80.0–100.0)
MONOS PCT: 16 %
Monocytes Absolute: 0.8 10*3/uL (ref 0.2–0.9)
NEUTROS ABS: 2.2 10*3/uL (ref 1.4–6.5)
Neutrophils Relative %: 41 %
Platelets: 174 10*3/uL (ref 150–440)
RBC: 4.33 MIL/uL (ref 3.80–5.20)
RDW: 13.3 % (ref 11.5–14.5)
WBC: 5.3 10*3/uL (ref 3.6–11.0)

## 2018-06-09 MED ORDER — HYDROCHLOROTHIAZIDE 12.5 MG PO CAPS: 12.5000 mg | ORAL_CAPSULE | Freq: Every day | ORAL | 0 refills | Status: DC
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
## 2018-06-09 NOTE — ED Triage Notes (Addendum)
Patient ambulatory to triage with steady gait, without difficulty or distress noted ; pt reports trip to [place] 2wks ago, walking a lot; having ache to legs since; st feels like a toothache; pt denies any accomp symptoms

## 2018-06-09 NOTE — Discharge Instructions (Addendum)
Your exam and labs are essentially normal at this time. Take the fluid pill as directed. Follow-up with your provider for ongoing symptoms.

## 2018-06-11 ENCOUNTER — Ambulatory Visit
Admission: EM | Admit: 2018-06-11 | Discharge: 2018-06-11 | Disposition: A | Discharge status: Home / Self Care | Payer: Commercial Managed Care - PPO | Attending: Family Medicine | Admitting: Family Medicine

## 2018-06-11 ENCOUNTER — Emergency Department
Admission: EM | Admit: 2018-06-11 | Discharge: 2018-06-12 | Disposition: A | Discharge status: Home / Self Care | Payer: Commercial Managed Care - PPO | Attending: Emergency Medicine | Admitting: Emergency Medicine

## 2018-06-11 ENCOUNTER — Emergency Department: Payer: Commercial Managed Care - PPO

## 2018-06-11 ENCOUNTER — Encounter: Payer: Self-pay | Admitting: Gynecology

## 2018-06-11 ENCOUNTER — Other Ambulatory Visit: Payer: Self-pay

## 2018-06-11 ENCOUNTER — Encounter: Payer: Self-pay | Admitting: Emergency Medicine

## 2018-06-11 DIAGNOSIS — Z79899 Other long term (current) drug therapy: Secondary | ICD-10-CM | POA: Insufficient documentation

## 2018-06-11 DIAGNOSIS — Z87891 Personal history of nicotine dependence: Secondary | ICD-10-CM | POA: Diagnosis not present

## 2018-06-11 DIAGNOSIS — R5383 Other fatigue: Secondary | ICD-10-CM | POA: Diagnosis not present

## 2018-06-11 DIAGNOSIS — J45909 Unspecified asthma, uncomplicated: Secondary | ICD-10-CM | POA: Diagnosis not present

## 2018-06-11 DIAGNOSIS — M79605 Pain in left leg: Secondary | ICD-10-CM | POA: Diagnosis present

## 2018-06-11 DIAGNOSIS — M79604 Pain in right leg: Secondary | ICD-10-CM | POA: Diagnosis not present

## 2018-06-11 DIAGNOSIS — I10 Essential (primary) hypertension: Secondary | ICD-10-CM | POA: Insufficient documentation

## 2018-06-11 LAB — FIBRIN DERIVATIVES D-DIMER (ARMC ONLY): Fibrin derivatives D-dimer (ARMC): 371.23 ng/mL (FEU) (ref 0.00–499.00)

## 2018-06-11 MED ORDER — CALCIUM CARBONATE ANTACID 500 MG PO CHEW
500.0000 mg | CHEWABLE_TABLET | Freq: Once | ORAL | Status: AC
  Administered 2018-06-12: 500 mg via ORAL
  Filled 2018-06-11: qty 3

## 2018-06-11 MED ORDER — CALCIUM CARBONATE 1250 (500 CA) MG PO TABS: 1.0000 | ORAL_TABLET | Freq: Once | ORAL | Status: DC

## 2018-06-11 NOTE — ED Triage Notes (Addendum)
Patient ambulatory to triage with steady gait, without difficulty or distress noted; pt reports she was sent over by [facility] to have d-dimer performed as their machine was "not working"; pt seen recently here for aching to legs and st symptoms persist with no accomp symptoms, no swelling; pt was sent to [facility] by PCP because of family hx of blood clots; d-dimer results are in chart

## 2018-06-11 NOTE — ED Triage Notes (Signed)
Per patient c/o feeling weak and tired for the past few days. Per patient took blood pressure at [facility] and reading show 159/100.

## 2018-06-11 NOTE — ED Notes (Signed)
Spoke with Dr Quentin Cornwall regarding pt's presenting c/o; order obtained for u/s only at this time

## 2018-06-11 NOTE — ED Provider Notes (Signed)
[facility] ____________________________________________  Time seen: Approximately 5:28 PM  I have reviewed the triage vital signs and the nursing notes.   HISTORY  Chief Complaint Hypertension   HPI Molly Stone is a 37 y.o. female past medical history of GERD, nephrolithiasis, malaise and fatigue, endometriosis, laryngeal spasms and obesity presenting for evaluation of fatigue present for the last few days.  States that today she had more energy and was feeling better, but reports that she was walking through [place] approximately 1 hour ago she felt flushed and very heated.  States that she then checked her blood pressure is 159/100.  States at this time she came in just to be evaluated to make sure she was okay.  States currently she feels fine.  Reports however over the last 3 to 4 days she has felt more fatigued, intermittent cough but states similar to her allergies, and has had intermittent extremity swelling.  States she was seen in ER 2 days ago for the peripheral swelling that is present only after being on her feet for extended periods of time and then quickly resolves with prompting her legs up.  States that she is feeling better from ER visit that her legs are no longer swollen.  States she was given in the ER HCTZ 12.5 mg to use as needed but states that she has not taken this medication yet.  Again states currently she feels fine.  Denies aggravating or alleviating factors.  States did feel somewhat anxious during the episode but denies anxiety currently.  States resting heart rate normally in the 90s, currently 115.  Denies any chest pain, shortness of breath, palpitations, chest pain with deep breaths, abdominal pain, syncope or near syncope.  No paresthesias.  Patient again states that she has been much more fatigued than her normal.  States that she does follow with her primary regularly and has regular labs including thyroid which are normal except for her  vitamin D is low and taken over-the-counter supplements.  Reports she has previously had a stress test and cardiology evaluation with negative findings.  Reports extensive family medical history, including father and grandmother and great aunt with clotting disorder with multiple blood clots.  No personal history of blood clots. Denies recent sickness. Denies recent antibiotic use.   Patient's last menstrual period was 05/21/2018 (exact date).Denies pregnancy.  Crecencio Mc, MD: PCP   Past Medical History:  Diagnosis Date  . Allergy   . Anemia   . Anxiety   . Asthma   . Chlamydia    h/o  . Disorder of vocal cord    spasmodic dysphonia  . Dysmenorrhea   . Dyspareunia, female   . Endometriosis   . Family history of endometriosis   . GERD (gastroesophageal reflux disease)   . Headache    migraine  . Heavy periods   . History of nephrolithiasis   . Hypertension   . Jaundice, physiologic, newborn   . Obesity (BMI 30-39.9)   . Sleep apnea   . Tobacco user   . Vaginal Pap smear, abnormal     Patient Active Problem List   Diagnosis Date Noted  . History of right flank pain 04/14/2018  . Acute pain of left shoulder 03/26/2018  . Calculi, ureter 07/30/2017  . Asthma 03/27/2017  . Shortness of breath 12/20/2016  . Whiplash injury syndrome, subsequent encounter 08/04/2016  . Concussion without loss of consciousness 08/04/2016  . OSA on CPAP 07/24/2016  . Generalized anxiety disorder 10/25/2015  .  Tension headache, chronic 10/25/2015  . Family history of endometriosis 10/12/2015  . Dysmenorrhea 10/12/2015  . Acid reflux 08/15/2015  . H/O renal calculi 08/15/2015  . Obesity 08/15/2015  . Other malaise and fatigue 06/16/2014  . Encounter for preventive health examination 06/16/2014  . Malaise and fatigue 06/16/2014  . Sciatica 04/01/2014  . Vitamin D deficiency 04/12/2012  . 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  . History of nephrolithiasis   . GERD (gastroesophageal reflux  disease)     Past Surgical History:  Procedure Laterality Date  . DILATION AND CURETTAGE OF UTERUS    . laparoscopy    . WISDOM TOOTH EXTRACTION       No current facility-administered medications for this encounter.   Current Outpatient Medications:  .  albuterol (PROVENTIL HFA;VENTOLIN HFA) 108 (90 Base) MCG/ACT inhaler, Inhale 2 puffs into the lungs every 6 (six) hours as needed for wheezing or shortness of breath., Disp: 18 g, Rfl: 3 .  budesonide-formoterol (SYMBICORT) 160-4.5 MCG/ACT inhaler, Inhale 2 puffs into the lungs 2 (two) times daily., Disp: 1 Inhaler, Rfl: 5 .  cetirizine (ZYRTEC) 10 MG tablet, Take 10 mg by mouth daily., Disp: , Rfl:  .  diazepam (VALIUM) 2 MG tablet, Take 2 mg by mouth every 6 (six) hours as needed for anxiety., Disp: , Rfl:  .  hydrochlorothiazide (MICROZIDE) 12.5 MG capsule, Take 1 capsule (12.5 mg total) by mouth daily., Disp: 30 capsule, Rfl: 0 .  montelukast (SINGULAIR) 10 MG tablet, TAKE 1 TABLET AT BEDTIME, Disp: 90 tablet, Rfl: 1 .  Multiple Vitamin (MULTIVITAMIN) capsule, Take by mouth., Disp: , Rfl:  .  busPIRone (BUSPAR) 15 MG tablet, TAKE 1 TABLET(15 MG) BY MOUTH THREE TIMES DAILY, Disp: 90 tablet, Rfl: 1 .  diclofenac (VOLTAREN) 75 MG EC tablet, Take 1 tablet (75 mg total) by mouth 2 (two) times daily., Disp: 60 tablet, Rfl: 0 .  fexofenadine-pseudoephedrine (ALLEGRA-D) 60-120 MG 12 hr tablet, Take by mouth., Disp: , Rfl:  .  fluticasone (FLONASE) 50 MCG/ACT nasal spray, Place 1 spray into both nostrils daily. (Patient taking differently: Place 2 sprays into both nostrils daily. ), Disp: 15.8 g, Rfl: 5 .  mupirocin ointment (BACTROBAN) 2 %, Place 1 application into the nose 2 (two) times daily., Disp: 22 g, Rfl: 0 .  omeprazole (PRILOSEC) 20 MG capsule, Take 1 capsule (20 mg total) by mouth daily., Disp: 90 capsule, Rfl: 1 .  pantoprazole (PROTONIX) 40 MG tablet, TK 1 T PO ONCE D, Disp: , Rfl: 2 .  tamsulosin (FLOMAX) 0.4 MG CAPS capsule, Take  1 capsule (0.4 mg total) by mouth daily. For kidney stone, Disp: 30 capsule, Rfl: 0  Facility-Administered Medications Ordered in Other Encounters:  .  [COMPLETED] sodium chloride 0.9 % nebulizer solution 3 mL, 3 mL, Nebulization, Once, 3 mL at 02/18/17 0848 **FOLLOWED BY** [COMPLETED] methacholine (PROVOCHOLINE) inhaler solution 0.125 mg, 2 mL, Inhalation, Once, 0.125 mg at 02/18/17 0848 **FOLLOWED BY** [COMPLETED] methacholine (PROVOCHOLINE) inhaler solution 0.5 mg, 2 mL, Inhalation, Once, 0.5 mg at 02/18/17 0855 **FOLLOWED BY** [COMPLETED] methacholine (PROVOCHOLINE) inhaler solution 2 mg, 2 mL, Inhalation, Once, 2 mg at 02/18/17 0902 **FOLLOWED BY** methacholine (PROVOCHOLINE) inhaler solution 8 mg, 2 mL, Inhalation, Once **FOLLOWED BY** methacholine (PROVOCHOLINE) inhaler solution 32 mg, 2 mL, Inhalation, Once **FOLLOWED BY** albuterol (PROVENTIL) (2.5 MG/3ML) 0.083% nebulizer solution 2.5 mg, 2.5 mg, Nebulization, Once, Crecencio Mc, MD  Allergies Morphine and related; Tramadol hcl; Buprenorphine hcl; and Prednisone  Family History  Problem Relation Age of Onset  .  Endometriosis Mother   . Cancer Father        benign liver CA  . Stroke Father        recurrent blood clots on the brain   . Cancer Maternal Grandmother 30       ovarian Ca, still surviving   . Osteoporosis Maternal Grandmother   . Ovarian cancer Maternal Grandmother   . Heart disease Maternal Grandfather   . Cancer Maternal Grandfather 5       colon CA. metastatic  . Diabetes Maternal Uncle   . Heart disease Paternal Grandmother   . Breast cancer Neg Hx   . Colon cancer Neg Hx   . Kidney cancer Neg Hx   . Bladder Cancer Neg Hx     Social History Social History   Tobacco Use  . Smoking status: Former Smoker    Types: Cigarettes    Last attempt to quit: 10/2016    Years since quitting: 1.6  . Smokeless tobacco: Never Used  Substance Use Topics  . Alcohol use: Yes    Alcohol/week: 0.0 standard drinks     Comment: wine occassionally  . Drug use: No    Review of Systems Constitutional: No fever/chills Eyes: No visual changes. ENT: No sore throat. Cardiovascular: Denies chest pain. Respiratory: Denies shortness of breath. Gastrointestinal: No abdominal pain.  No nausea, no vomiting.  No diarrhea. Genitourinary: Negative for dysuria. Musculoskeletal: Negative for back pain. Skin: Negative for rash. Neurological: Negative for headaches, focal weakness or numbness.   ____________________________________________   PHYSICAL EXAM:  VITAL SIGNS: ED Triage Vitals [06/11/18 1721]  Enc Vitals Group     BP      Pulse      Resp      Temp      Temp src      SpO2      Weight (!) 301 lb (136.5 kg)     Height      Head Circumference      Peak Flow      Pain Score 0     Pain Loc      Pain Edu?      Excl. in [place]?    Vitals:   06/11/18 1721 06/11/18 1728 06/11/18 1758  BP:  132/78   Pulse:  (!) 115 (!) 107  Resp:  18   Temp:  97.8 F (36.6 C)   TempSrc:  Oral   SpO2:  98%   Weight: (!) 301 lb (136.5 kg)        Constitutional: Alert and oriented. Well appearing and in no acute distress. Eyes: Conjunctivae are normal. PERRL. EOMI. ENT      Head: Normocephalic and atraumatic.      Nose: No congestion/rhinnorhea.      Mouth/Throat: Mucous membranes are moist.Oropharynx non-erythematous. Neck: No stridor. Supple without meningismus.  Hematological/Lymphatic/Immunilogical: No cervical lymphadenopathy.  No thyromegaly noted. Cardiovascular: tachycardia, regular rhythm. Grossly normal heart sounds.  Good peripheral circulation. Respiratory: Normal respiratory effort without tachypnea nor retractions. Breath sounds are clear and equal bilaterally. No wheezes, rales, rhonchi. Gastrointestinal: Soft and nontender.  Musculoskeletal:  Steady gait. Bilateral pedal pulses equal and easily palpated.      Right lower leg:  No tenderness or edema.      Left lower leg:  No tenderness or  edema.  Neurologic:  Normal speech and language. No gross focal neurologic deficits are appreciated. Speech is normal. No gait instability.  Skin:  Skin is warm, dry and intact. No rash noted.  Psychiatric: Mood and affect are normal. Speech and behavior are normal. Patient exhibits appropriate insight and judgment   ___________________________________________   LABS (all labs ordered are listed, but only abnormal results are displayed)  Labs Reviewed  FIBRIN DERIVATIVES D-DIMER ([place])     Labs reviewed from most recent ER visit from 06/09/2018 CBC and BMP overall are unremarkable. __________________________________________________________________________________   PROCEDURES Procedures   INITIAL IMPRESSION / ASSESSMENT AND PLAN / ED COURSE  Pertinent labs & imaging results that were available during my care of the patient were reviewed by me and considered in my medical decision making (see chart for details).  Overall well-appearing patient.  No acute distress.  Chief complaint of recent fatigue though reports today was feeling better until episode of flushed sensation and elevated blood pressure.  States symptoms have since resolved.  Patient does acknowledge possible anxiety contributing factor.  Patient stating overwhelming fatigue, strong family history of clotting disorder of blood clots, and tachycardic currently in urgent care will evaluate d-dimer and discussed this in detail with patient and she agreed to this plan.  Reviewed previous labs from ER, will not repeat same labs at this time.  Patient reexamined, sitting calmly in room.  Patient denies any symptoms at this time other than feeling fatigued.  States otherwise feels well.  Heart rate rechecked, 96 manually.  D-dimer initial report negative.  Per labs they need to rerun the control.  Discussed with patient will call with final result.  Encourage supportive care, close primary follow-up, discussed overall weight loss.   Discussed strict follow-up and return parameters.  Patient lab later confirmed negative and discussed with patient via phone.  Discussed follow up with Primary care physician this week. Discussed follow up and return parameters including no resolution or any worsening concerns. Patient verbalized understanding and agreed to plan.   ____________________________________________   FINAL CLINICAL IMPRESSION(S) / ED DIAGNOSES  Final diagnoses:  Fatigue, unspecified type     ED Discharge Orders    None       Note: This dictation was prepared with Dragon dictation along with smaller phrase technology. Any transcriptional errors that result from this process are unintentional.         Marylene Land, NP 06/11/18 1939

## 2018-06-11 NOTE — ED Provider Notes (Signed)
[facility] Emergency Department Provider Note   First MD Initiated Contact with Patient 06/11/18 2349     (approximate)  I have reviewed the triage vital signs and the nursing notes.   HISTORY  Chief Complaint Leg Pain    HPI Molly Stone is a 37 y.o. female below list of chronic medical conditions furred to the emergency department for d-dimer with bilateral lower extremity muscle aches predominantly at night.  Patient denies any chest pain or shortness of breath no dyspnea.  Patient states that she was concerned because she has a family history of DVTs.  Patient denies any lower extremity swelling or pain at this time.   Past Medical History:  Diagnosis Date  . Allergy   . Anemia   . Anxiety   . Asthma   . Chlamydia    h/o  . Disorder of vocal cord    spasmodic dysphonia  . Dysmenorrhea   . Dyspareunia, female   . Endometriosis   . Family history of endometriosis   . GERD (gastroesophageal reflux disease)   . Headache    migraine  . Heavy periods   . History of nephrolithiasis   . Hypertension   . Jaundice, physiologic, newborn   . Obesity (BMI 30-39.9)   . Sleep apnea   . Tobacco user   . Vaginal Pap smear, abnormal     Patient Active Problem List   Diagnosis Date Noted  . History of right flank pain 04/14/2018  . Acute pain of left shoulder 03/26/2018  . Calculi, ureter 07/30/2017  . Asthma 03/27/2017  . Shortness of breath 12/20/2016  . Whiplash injury syndrome, subsequent encounter 08/04/2016  . Concussion without loss of consciousness 08/04/2016  . OSA on CPAP 07/24/2016  . Generalized anxiety disorder 10/25/2015  . Tension headache, chronic 10/25/2015  . Family history of endometriosis 10/12/2015  . Dysmenorrhea 10/12/2015  . Acid reflux 08/15/2015  . H/O renal calculi 08/15/2015  . Obesity 08/15/2015  . Other malaise and fatigue 06/16/2014  . Encounter for preventive health examination 06/16/2014  . Malaise and  fatigue 06/16/2014  . Sciatica 04/01/2014  . Vitamin D deficiency 04/12/2012  . 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  . History of nephrolithiasis   . GERD (gastroesophageal reflux disease)     Past Surgical History:  Procedure Laterality Date  . DILATION AND CURETTAGE OF UTERUS    . laparoscopy    . WISDOM TOOTH EXTRACTION      Prior to Admission medications   Medication Sig Start Date End Date Taking? Authorizing Provider  albuterol (PROVENTIL HFA;VENTOLIN HFA) 108 (90 Base) MCG/ACT inhaler Inhale 2 puffs into the lungs every 6 (six) hours as needed for wheezing or shortness of breath. 03/24/17   Flora Lipps, MD  budesonide-formoterol (SYMBICORT) 160-4.5 MCG/ACT inhaler Inhale 2 puffs into the lungs 2 (two) times daily. 03/24/17   Flora Lipps, MD  busPIRone (BUSPAR) 15 MG tablet TAKE 1 TABLET(15 MG) BY MOUTH THREE TIMES DAILY 02/26/18   Crecencio Mc, MD  cetirizine (ZYRTEC) 10 MG tablet Take 10 mg by mouth daily.    [provider]  diazepam (VALIUM) 2 MG tablet Take 2 mg by mouth every 6 (six) hours as needed for anxiety.    [provider]  diclofenac (VOLTAREN) 75 MG EC tablet Take 1 tablet (75 mg total) by mouth 2 (two) times daily. 03/25/18   Crecencio Mc, MD  fexofenadine-pseudoephedrine (ALLEGRA-D) 60-120 MG 12 hr tablet Take by mouth.  [provider]  fluticasone (FLONASE) 50 MCG/ACT nasal spray Place 1 spray into both nostrils daily. Patient taking differently: Place 2 sprays into both nostrils daily.  07/25/17   Crecencio Mc, MD  hydrochlorothiazide (MICROZIDE) 12.5 MG capsule Take 1 capsule (12.5 mg total) by mouth daily. 06/09/18 07/09/18  Menshew, Dannielle Karvonen, PA-C  montelukast (SINGULAIR) 10 MG tablet TAKE 1 TABLET AT BEDTIME 06/02/18   Crecencio Mc, MD  Multiple Vitamin (MULTIVITAMIN) capsule Take by mouth.    [provider]  mupirocin ointment (BACTROBAN) 2 % Place 1 application into the nose 2 (two) times daily. 02/12/18   Delano Metz, FNP  omeprazole (PRILOSEC) 20 MG capsule Take 1 capsule (20 mg total) by mouth daily. 10/20/17   Crecencio Mc, MD  pantoprazole (PROTONIX) 40 MG tablet TK 1 T PO ONCE D 06/01/18   [provider]  tamsulosin (FLOMAX) 0.4 MG CAPS capsule Take 1 capsule (0.4 mg total) by mouth daily. For kidney stone 04/13/18   Crecencio Mc, MD    Allergies Morphine and related; Tramadol hcl; Buprenorphine hcl; and Prednisone  Family History  Problem Relation Age of Onset  . Endometriosis Mother   . Cancer Father        benign liver CA  . Stroke Father        recurrent blood clots on the brain   . Cancer Maternal Grandmother 30       ovarian Ca, still surviving   . Osteoporosis Maternal Grandmother   . Ovarian cancer Maternal Grandmother   . Heart disease Maternal Grandfather   . Cancer Maternal Grandfather 34       colon CA. metastatic  . Diabetes Maternal Uncle   . Heart disease Paternal Grandmother   . Breast cancer Neg Hx   . Colon cancer Neg Hx   . Kidney cancer Neg Hx   . Bladder Cancer Neg Hx     Social History Social History   Tobacco Use  . Smoking status: Former Smoker    Types: Cigarettes    Last attempt to quit: 10/2016    Years since quitting: 1.6  . Smokeless tobacco: Never Used  Substance Use Topics  . Alcohol use: Yes    Alcohol/week: 0.0 standard drinks    Comment: wine occassionally  . Drug use: No    Review of Systems Constitutional: No fever/chills Eyes: No visual changes. ENT: No sore throat. Cardiovascular: Denies chest pain. Respiratory: Denies shortness of breath. Gastrointestinal: No abdominal pain.  No nausea, no vomiting.  No diarrhea.  No constipation. Genitourinary: Negative for dysuria. Musculoskeletal: Negative for neck pain.  Negative for back pain.  Positive bilateral lower extremity pain Integumentary: Negative for rash. Neurological: Negative for headaches, focal weakness or  numbness.   ____________________________________________   PHYSICAL EXAM:  VITAL SIGNS: ED Triage Vitals  Enc Vitals Group     BP 06/11/18 2155 134/75     Pulse Rate 06/11/18 2155 (!) 109     Resp 06/11/18 2155 18     Temp 06/11/18 2155 98 F (36.7 C)     Temp Source 06/11/18 2155 Oral     SpO2 06/11/18 2155 97 %     Weight 06/11/18 2154 (!) 136.5 kg (300 lb 14.9 oz)     Height 06/11/18 2154 1.727 m (5\' 8" )     Head Circumference --      Peak Flow --      Pain Score 06/11/18 2154 0  Pain Loc --      Pain Edu? --      Excl. in [place]? --     Constitutional: Alert and oriented. Well appearing and in no acute distress. Eyes: Conjunctivae are normal. PERRL. EOMI. Head: Atraumatic.Marland Kitchen Mouth/Throat: Mucous membranes are moist.  Oropharynx non-erythematous. Neck: No stridor.  No meningeal signs.   Cardiovascular: Normal rate, regular rhythm. Good peripheral circulation. Grossly normal heart sounds. Respiratory: Normal respiratory effort.  No retractions. Lungs CTAB. Gastrointestinal: Soft and nontender. No distention.  Musculoskeletal: No lower extremity tenderness nor edema. No gross deformities of extremities. Neurologic:  Normal speech and language. No gross focal neurologic deficits are appreciated.  Skin:  Skin is warm, dry and intact. No rash noted. Psychiatric: Mood and affect are normal. Speech and behavior are normal.    RADIOLOGY I, [place], personally viewed and evaluated these images (plain radiographs) as part of my medical decision making, as well as reviewing the written report by the radiologist.  ED MD interpretation: No evidence of DVT bilateral lower extremity ultrasound per radiologist.  Official radiology report(s): US Venous Img Lower Bilateral  Result Date: 06/11/2018 CLINICAL DATA:  Aching in both legs for 3 weeks. EXAM: BILATERAL LOWER EXTREMITY VENOUS DOPPLER ULTRASOUND TECHNIQUE: Gray-scale sonography with graded compression, as well as  color Doppler and duplex ultrasound were performed to evaluate the lower extremity deep venous systems from the level of the common femoral vein and including the common femoral, femoral, profunda femoral, popliteal and calf veins including the posterior tibial, peroneal and gastrocnemius veins when visible. The superficial great saphenous vein was also interrogated. Spectral Doppler was utilized to evaluate flow at rest and with distal augmentation maneuvers in the common femoral, femoral and popliteal veins. COMPARISON:  None. FINDINGS: RIGHT LOWER EXTREMITY Common Femoral Vein: No evidence of thrombus. Normal compressibility, respiratory phasicity and response to augmentation. Saphenofemoral Junction: No evidence of thrombus. Normal compressibility and flow on color Doppler imaging. Profunda Femoral Vein: No evidence of thrombus. Normal compressibility and flow on color Doppler imaging. Femoral Vein: No evidence of thrombus. Normal compressibility, respiratory phasicity and response to augmentation. Popliteal Vein: No evidence of thrombus. Normal compressibility, respiratory phasicity and response to augmentation. Calf Veins: No evidence of thrombus. Normal compressibility and flow on color Doppler imaging. Superficial Great Saphenous Vein: No evidence of thrombus. Normal compressibility. Venous Reflux:  None. Other Findings:  None. LEFT LOWER EXTREMITY Common Femoral Vein: No evidence of thrombus. Normal compressibility, respiratory phasicity and response to augmentation. Saphenofemoral Junction: No evidence of thrombus. Normal compressibility and flow on color Doppler imaging. Profunda Femoral Vein: No evidence of thrombus. Normal compressibility and flow on color Doppler imaging. Femoral Vein: No evidence of thrombus. Normal compressibility, respiratory phasicity and response to augmentation. Popliteal Vein: No evidence of thrombus. Normal compressibility, respiratory phasicity and response to augmentation.  Calf Veins: No evidence of thrombus. Normal compressibility and flow on color Doppler imaging. Superficial Great Saphenous Vein: No evidence of thrombus. Normal compressibility. Venous Reflux:  None. Other Findings:  None. IMPRESSION: No evidence of deep venous thrombosis. Electronically Signed   By: Ashley Royalty M.D.   On: 06/11/2018 23:25      Procedures   ____________________________________________   INITIAL IMPRESSION / ASSESSMENT AND PLAN / ED COURSE  As part of my medical decision making, I reviewed the following data within the electronic MEDICAL RECORD NUMBER   37 year old female present with above-stated history and physical exam secondary to referral from [facility] urgent care for d-dimer.  D-dimer on  record -[id number].  Patient's ultrasound revealed no evidence of DVT.  Patient noted to be hypocalcemic with calcium of 8.5.  Patient admits to history of hypocalcemia for which she takes vitamin D. ____________________________________________  FINAL CLINICAL IMPRESSION(S) / ED DIAGNOSES  Final diagnoses:  Bilateral leg pain  Hypocalcemia     MEDICATIONS GIVEN DURING THIS VISIT:  Medications  calcium carbonate (OS-CAL - dosed in mg of elemental calcium) tablet 500 mg of elemental calcium (has no administration in time range)     ED Discharge Orders    None       Note:  This document was prepared using Dragon voice recognition software and may include unintentional dictation errors.    Gregor Hams, MD 06/12/18 Dyann Kief

## 2018-06-11 NOTE — Discharge Instructions (Addendum)
Continue supportive care as discussed.  Follow-up closely with primary care.  Return to urgent care as needed.

## 2018-06-12 ENCOUNTER — Telehealth (HOSPITAL_COMMUNITY): Payer: Self-pay

## 2018-06-12 NOTE — Telephone Encounter (Signed)
Labs within normal limits. Attempted to reach patient. No answer.

## 2018-07-02 DIAGNOSIS — J385 Laryngeal spasm: Secondary | ICD-10-CM | POA: Diagnosis not present

## 2018-07-09 ENCOUNTER — Ambulatory Visit: Payer: Commercial Managed Care - PPO | Admitting: Gastroenterology

## 2018-07-09 ENCOUNTER — Other Ambulatory Visit: Payer: Self-pay

## 2018-07-09 ENCOUNTER — Encounter: Payer: Self-pay | Admitting: Gastroenterology

## 2018-07-09 VITALS — BP 119/85 | HR 114 | Ht 68.0 in | Wt 293.0 lb

## 2018-07-09 DIAGNOSIS — R1310 Dysphagia, unspecified: Secondary | ICD-10-CM

## 2018-07-09 DIAGNOSIS — K219 Gastro-esophageal reflux disease without esophagitis: Secondary | ICD-10-CM | POA: Diagnosis not present

## 2018-07-09 NOTE — Progress Notes (Signed)
Molly Bellows MD, MRCP(U.K) [address]  [facility]  [place], [place] [address]  Main: [phone]  Fax: [phone]   Gastroenterology Consultation  Referring Provider:     Beverly Gust, MD Primary Care Physician:  Crecencio Mc, MD Primary Gastroenterologist:  Dr. Jonathon Stone  Reason for Consultation:     GERD, dysphagia         HPI:   Molly Stone is a 37 y.o. y/o female referred for consultation & management  by Dr. Derrel Nip, Aris Everts, MD.     She has been referred for GERD.   Reflux:  Onset : She says that she has had GERD since 2004, since then it has always been "bad" , burning sensation in the throat , very seldom has no symptoms. Since 2004 has gained atleast 20 lbs.   She takes 40 mg protonix once a day before breakfast ,does wake up in the night at times with symptoms. Did have some laryngospasm in her sleep   No opiods. She has her dinner at 4-5 pm and goes to bed at 11 pm .   No family history of esophageal cancer.    She says she has had issues with swallowing for "years" , sporadic, once a month, affects her "spit" , food also gets stuck and doe snot go down. Liquids go down fine.   She has seasonal allergies. Never had an endoscopy .      Past Medical History:  Diagnosis Date  . Allergy   . Anemia   . Anxiety   . Asthma   . Chlamydia    h/o  . Disorder of vocal cord    spasmodic dysphonia  . Dysmenorrhea   . Dyspareunia, female   . Endometriosis   . Family history of endometriosis   . GERD (gastroesophageal reflux disease)   . Headache    migraine  . Heavy periods   . History of nephrolithiasis   . Hypertension   . Jaundice, physiologic, newborn   . Obesity (BMI 30-39.9)   . Sleep apnea   . Tobacco user   . Vaginal Pap smear, abnormal     Past Surgical History:  Procedure Laterality Date  . DILATION AND CURETTAGE OF UTERUS    . laparoscopy    . WISDOM TOOTH EXTRACTION      Prior to Admission medications     Medication Sig Start Date End Date Taking? Authorizing Provider  albuterol (PROVENTIL HFA;VENTOLIN HFA) 108 (90 Base) MCG/ACT inhaler Inhale 2 puffs into the lungs every 6 (six) hours as needed for wheezing or shortness of breath. 03/24/17   Flora Lipps, MD  budesonide-formoterol (SYMBICORT) 160-4.5 MCG/ACT inhaler Inhale 2 puffs into the lungs 2 (two) times daily. 03/24/17   Flora Lipps, MD  busPIRone (BUSPAR) 15 MG tablet TAKE 1 TABLET(15 MG) BY MOUTH THREE TIMES DAILY 02/26/18   Crecencio Mc, MD  cetirizine (ZYRTEC) 10 MG tablet Take 10 mg by mouth daily.    [provider]  diazepam (VALIUM) 2 MG tablet Take 2 mg by mouth every 6 (six) hours as needed for anxiety.    [provider]  diclofenac (VOLTAREN) 75 MG EC tablet Take 1 tablet (75 mg total) by mouth 2 (two) times daily. 03/25/18   Crecencio Mc, MD  fexofenadine-pseudoephedrine (ALLEGRA-D) 60-120 MG 12 hr tablet Take by mouth.    [provider]  fluticasone (FLONASE) 50 MCG/ACT nasal spray Place 1 spray into both nostrils daily. Patient taking differently:  Place 2 sprays into both nostrils daily.  07/25/17   Crecencio Mc, MD  hydrochlorothiazide (MICROZIDE) 12.5 MG capsule Take 1 capsule (12.5 mg total) by mouth daily. 06/09/18 07/09/18  Menshew, Dannielle Karvonen, PA-C  montelukast (SINGULAIR) 10 MG tablet TAKE 1 TABLET AT BEDTIME 06/02/18   Crecencio Mc, MD  Multiple Vitamin (MULTIVITAMIN) capsule Take by mouth.    [provider]  mupirocin ointment (BACTROBAN) 2 % Place 1 application into the nose 2 (two) times daily. 02/12/18   Delano Metz, FNP  omeprazole (PRILOSEC) 20 MG capsule Take 1 capsule (20 mg total) by mouth daily. 10/20/17   Crecencio Mc, MD  pantoprazole (PROTONIX) 40 MG tablet TK 1 T PO ONCE D 06/01/18   [provider]  tamsulosin (FLOMAX) 0.4 MG CAPS capsule Take 1 capsule (0.4 mg total) by mouth daily. For kidney stone 04/13/18   Crecencio Mc, MD    Family  History  Problem Relation Age of Onset  . Endometriosis Mother   . Cancer Father        benign liver CA  . Stroke Father        recurrent blood clots on the brain   . Cancer Maternal Grandmother 30       ovarian Ca, still surviving   . Osteoporosis Maternal Grandmother   . Ovarian cancer Maternal Grandmother   . Heart disease Maternal Grandfather   . Cancer Maternal Grandfather 38       colon CA. metastatic  . Diabetes Maternal Uncle   . Heart disease Paternal Grandmother   . Breast cancer Neg Hx   . Colon cancer Neg Hx   . Kidney cancer Neg Hx   . Bladder Cancer Neg Hx      Social History   Tobacco Use  . Smoking status: Former Smoker    Types: Cigarettes    Last attempt to quit: 10/2016    Years since quitting: 1.7  . Smokeless tobacco: Never Used  Substance Use Topics  . Alcohol use: Yes    Alcohol/week: 0.0 standard drinks    Comment: wine occassionally  . Drug use: No    Allergies as of 07/09/2018 - Review Complete 06/11/2018  Allergen Reaction Noted  . Morphine and related Itching 03/18/2012  . Tramadol hcl  06/11/2017  . Buprenorphine hcl Itching 08/18/2015  . Prednisone Anxiety 06/14/2014    Review of Systems:    All systems reviewed and negative except where noted in HPI.   Physical Exam:  There were no vitals taken for this visit. No LMP recorded. Psych:  Alert and cooperative. Normal mood and affect. General:   Alert,  Well-developed, well-nourished, pleasant and cooperative in NAD Head:  Normocephalic and atraumatic. Eyes:  Sclera clear, no icterus.   Conjunctiva pink. Ears:  Normal auditory acuity. Nose:  No deformity, discharge, or lesions. Mouth:  No deformity or lesions,oropharynx pink & moist. Neck:  Supple; no masses or thyromegaly. Lungs:  Respirations even and unlabored.  Clear throughout to auscultation.   No wheezes, crackles, or rhonchi. No acute distress. Heart:  Regular rate and rhythm; no murmurs, clicks, rubs, or  gallops. Abdomen:  Normal bowel sounds.  No bruits.  Soft, non-tender and non-distended without masses, hepatosplenomegaly or hernias noted.  No guarding or rebound tenderness.    Msk:  Symmetrical without gross deformities. Good, equal movement & strength bilaterally. Pulses:  Normal pulses noted. Extremities:  No clubbing or edema.  No cyanosis. Neurologic:  Alert and oriented  x3;  grossly normal neurologically. Skin:  Intact without significant lesions or rashes. No jaundice. Lymph Nodes:  No significant cervical adenopathy. Psych:  Alert and cooperative. Normal mood and affect.  Imaging Studies: US Venous Img Lower Bilateral  Result Date: 06/11/2018 CLINICAL DATA:  Aching in both legs for 3 weeks. EXAM: BILATERAL LOWER EXTREMITY VENOUS DOPPLER ULTRASOUND TECHNIQUE: Gray-scale sonography with graded compression, as well as color Doppler and duplex ultrasound were performed to evaluate the lower extremity deep venous systems from the level of the common femoral vein and including the common femoral, femoral, profunda femoral, popliteal and calf veins including the posterior tibial, peroneal and gastrocnemius veins when visible. The superficial great saphenous vein was also interrogated. Spectral Doppler was utilized to evaluate flow at rest and with distal augmentation maneuvers in the common femoral, femoral and popliteal veins. COMPARISON:  None. FINDINGS: RIGHT LOWER EXTREMITY Common Femoral Vein: No evidence of thrombus. Normal compressibility, respiratory phasicity and response to augmentation. Saphenofemoral Junction: No evidence of thrombus. Normal compressibility and flow on color Doppler imaging. Profunda Femoral Vein: No evidence of thrombus. Normal compressibility and flow on color Doppler imaging. Femoral Vein: No evidence of thrombus. Normal compressibility, respiratory phasicity and response to augmentation. Popliteal Vein: No evidence of thrombus. Normal compressibility, respiratory  phasicity and response to augmentation. Calf Veins: No evidence of thrombus. Normal compressibility and flow on color Doppler imaging. Superficial Great Saphenous Vein: No evidence of thrombus. Normal compressibility. Venous Reflux:  None. Other Findings:  None. LEFT LOWER EXTREMITY Common Femoral Vein: No evidence of thrombus. Normal compressibility, respiratory phasicity and response to augmentation. Saphenofemoral Junction: No evidence of thrombus. Normal compressibility and flow on color Doppler imaging. Profunda Femoral Vein: No evidence of thrombus. Normal compressibility and flow on color Doppler imaging. Femoral Vein: No evidence of thrombus. Normal compressibility, respiratory phasicity and response to augmentation. Popliteal Vein: No evidence of thrombus. Normal compressibility, respiratory phasicity and response to augmentation. Calf Veins: No evidence of thrombus. Normal compressibility and flow on color Doppler imaging. Superficial Great Saphenous Vein: No evidence of thrombus. Normal compressibility. Venous Reflux:  None. Other Findings:  None. IMPRESSION: No evidence of deep venous thrombosis. Electronically Signed   By: Ashley Royalty M.D.   On: 06/11/2018 23:25    Assessment and Plan:   Molly Stone is a 37 y.o. y/o female has been referred for GERD and dysphagia.   GERD : Counseled on life style changes, suggest to use PPI first thing in the morning on empty stomach and eat 30 minutes after. Advised on the use of a wedge pillow at night , avoid meals for 2 hours prior to bed time. Weight loss  Aim to use at the lowest dose for the shortest period of time .  Will plan to r/o esophageal strictures and EOE on EGD. Will increase protonix to 40 mg BID  I have discussed alternative options, risks & benefits,  which include, but are not limited to, bleeding, infection, perforation,respiratory complication & drug reaction.  The patient agrees with this plan & written consent will be obtained.      Follow up in 4 weeks   Dr Molly Bellows MD,MRCP(U.K)

## 2018-07-14 ENCOUNTER — Ambulatory Visit: Payer: Commercial Managed Care - PPO | Admitting: Internal Medicine

## 2018-07-15 ENCOUNTER — Telehealth: Payer: Self-pay | Admitting: Obstetrics & Gynecology

## 2018-07-15 NOTE — Telephone Encounter (Signed)
Patient is schedule 08/04/18 with [facility]

## 2018-07-15 NOTE — Telephone Encounter (Signed)
We have received records from [facility] for transfer GYN care. Called and left voicemail for patient to call back to be schedule

## 2018-07-16 ENCOUNTER — Telehealth: Payer: Self-pay

## 2018-07-16 NOTE — Telephone Encounter (Signed)
Pt called to reschedule EGD procedure to a later date due to falling ill. Pt agreed to reschedule procedure to 08-07-18. I have called [facility] to inform.

## 2018-07-27 DIAGNOSIS — J069 Acute upper respiratory infection, unspecified: Secondary | ICD-10-CM | POA: Diagnosis not present

## 2018-08-04 ENCOUNTER — Ambulatory Visit (INDEPENDENT_AMBULATORY_CARE_PROVIDER_SITE_OTHER): Payer: Commercial Managed Care - PPO | Admitting: Obstetrics & Gynecology

## 2018-08-04 ENCOUNTER — Other Ambulatory Visit (HOSPITAL_COMMUNITY)
Admission: RE | Admit: 2018-08-04 | Discharge: 2018-08-04 | Disposition: A | Discharge status: Home / Self Care | Payer: Commercial Managed Care - PPO | Source: Ambulatory Visit | Attending: Obstetrics & Gynecology | Admitting: Obstetrics & Gynecology

## 2018-08-04 ENCOUNTER — Encounter: Payer: Self-pay | Admitting: Obstetrics & Gynecology

## 2018-08-04 VITALS — BP 120/80 | Ht 68.0 in | Wt 290.0 lb

## 2018-08-04 DIAGNOSIS — N946 Dysmenorrhea, unspecified: Secondary | ICD-10-CM | POA: Diagnosis not present

## 2018-08-04 DIAGNOSIS — Z0000 Encounter for general adult medical examination without abnormal findings: Secondary | ICD-10-CM

## 2018-08-04 DIAGNOSIS — N858 Other specified noninflammatory disorders of uterus: Secondary | ICD-10-CM | POA: Diagnosis not present

## 2018-08-04 DIAGNOSIS — N920 Excessive and frequent menstruation with regular cycle: Secondary | ICD-10-CM | POA: Insufficient documentation

## 2018-08-04 DIAGNOSIS — Z01419 Encounter for gynecological examination (general) (routine) without abnormal findings: Secondary | ICD-10-CM

## 2018-08-04 DIAGNOSIS — Z124 Encounter for screening for malignant neoplasm of cervix: Secondary | ICD-10-CM

## 2018-08-04 NOTE — Patient Instructions (Signed)
Endometrial Ablation Endometrial ablation is a procedure that destroys the thin inner layer of the lining of the uterus (endometrium). This procedure may be done:  To stop heavy periods.  To stop bleeding that is causing anemia.  To control irregular bleeding.  To treat bleeding caused by small tumors (fibroids) in the endometrium.  This procedure is often an alternative to major surgery, such as removal of the uterus and cervix (hysterectomy). As a result of this procedure:  You may not be able to have children. However, if you are premenopausal (you have not gone through menopause): ? You may still have a small chance of getting pregnant. ? You will need to use a reliable method of birth control after the procedure to prevent pregnancy.  You may stop having a menstrual period, or you may have only a small amount of bleeding during your period. Menstruation may return several years after the procedure.  Tell a health care provider about:  Any allergies you have.  All medicines you are taking, including vitamins, herbs, eye drops, creams, and over-the-counter medicines.  Any problems you or family members have had with the use of anesthetic medicines.  Any blood disorders you have.  Any surgeries you have had.  Any medical conditions you have. What are the risks? Generally, this is a safe procedure. However, problems may occur, including:  A hole (perforation) in the uterus or bowel.  Infection of the uterus, bladder, or vagina.  Bleeding.  Damage to other structures or organs.  An air bubble in the lung (air embolus).  Problems with pregnancy after the procedure.  Failure of the procedure.  Decreased ability to diagnose cancer in the endometrium.  What happens before the procedure?  You will have tests of your endometrium to make sure there are no pre-cancerous cells or cancer cells present.  You may have an ultrasound of the uterus.  You may be given  medicines to thin the endometrium.  Ask your health care provider about: ? Changing or stopping your regular medicines. This is especially important if you take diabetes medicines or blood thinners. ? Taking medicines such as aspirin and ibuprofen. These medicines can thin your blood. Do not take these medicines before your procedure if your doctor tells you not to.  Plan to have someone take you home from the hospital or clinic. What happens during the procedure?  You will lie on an exam table with your feet and legs supported as in a pelvic exam.  To lower your risk of infection: ? Your health care team will wash or sanitize their hands and put on germ-free (sterile) gloves. ? Your genital area will be washed with soap.  An IV tube will be inserted into one of your veins.  You will be given a medicine to help you relax (sedative).  A surgical instrument with a light and camera (resectoscope) will be inserted into your vagina and moved into your uterus. This allows your surgeon to see inside your uterus.  Endometrial tissue will be removed using one of the following methods: ? Radiofrequency. This method uses a radiofrequency-alternating electric current to remove the endometrium. ? Cryotherapy. This method uses extreme cold to freeze the endometrium. ? Heated-free liquid. This method uses a heated saltwater (saline) solution to remove the endometrium. ? Microwave. This method uses high-energy microwaves to heat up the endometrium and remove it. ? Thermal balloon. This method involves inserting a catheter with a balloon tip into the uterus. The balloon tip is   filled with heated fluid to remove the endometrium. The procedure may vary among health care providers and hospitals. What happens after the procedure?  Your blood pressure, heart rate, breathing rate, and blood oxygen level will be monitored until the medicines you were given have worn off.  As tissue healing occurs, you may  notice vaginal bleeding for 4-6 weeks after the procedure. You may also experience: ? Cramps. ? Thin, watery vaginal discharge that is light pink or brown in color. ? A need to urinate more frequently than usual. ? Nausea.  Do not drive for 24 hours if you were given a sedative.  Do not have sex or insert anything into your vagina until your health care provider approves. Summary  Endometrial ablation is done to treat the many causes of heavy menstrual bleeding.  The procedure may be done only after medications have been tried to control the bleeding.  Plan to have someone take you home from the hospital or clinic. This information is not intended to replace advice given to you by your health care provider. Make sure you discuss any questions you have with your health care provider. Document Released: 08/16/2004 Document Revised: 10/24/2016 Document Reviewed: 10/24/2016 [employer] Interactive Patient Education  2017 [employer]  

## 2018-08-04 NOTE — Progress Notes (Signed)
HPI:      Ms. Molly Stone is a 37 y.o. [phone] who LMP was Patient's last menstrual period was 07/20/2018., she presents today for her annual examination. The patient has no complaints today. The patient is sexually active. Her last pap: was normal. The patient does perform self breast exams.  There is notable family history of breast or ovarian cancer in her family.  The patient has regular exercise: yes.  The patient denies current symptoms of depression.  Weight stable but a concern.  Pt has heavy periods that are severely painful on a a monthly basis for years.  Was told she may have endometriosis.  Lap 2015, no bx evidence for endometriosis.  Has avoided estrogen due to Ol[place]o and Ol[place]o DVT risk.  Concerned about cancer risk.  Does not like idea of IUD.  GYN History: Contraception: vasectomy  PMHx: Past Medical History:  Diagnosis Date  . Allergy   . Anemia   . Anxiety   . Asthma   . Chlamydia    h/o  . Disorder of vocal cord    spasmodic dysphonia  . Dysmenorrhea   . Dyspareunia, female   . Endometriosis   . Family history of endometriosis   . GERD (gastroesophageal reflux disease)   . Headache    migraine  . Heavy periods   . History of nephrolithiasis   . Hypertension   . Jaundice, physiologic, newborn   . Obesity (BMI 30-39.9)   . Sleep apnea   . Tobacco user   . Vaginal Pap smear, abnormal    Past Surgical History:  Procedure Laterality Date  . DILATION AND CURETTAGE OF UTERUS    . laparoscopy    . WISDOM TOOTH EXTRACTION     Family History  Problem Relation Age of Onset  . Endometriosis Mother   . Cancer Father        benign liver CA  . Stroke Father        recurrent blood clots on the brain   . Cancer Maternal Grandmother 30       ovarian Ca, still surviving   . Osteoporosis Maternal Grandmother   . Ovarian cancer Maternal Grandmother   . Heart disease Maternal Grandfather   . Cancer Maternal Grandfather 39       colon CA. metastatic  . Diabetes Maternal  Uncle   . Heart disease Paternal Grandmother   . Breast cancer Neg Hx   . Colon cancer Neg Hx   . Kidney cancer Neg Hx   . Bladder Cancer Neg Hx    Social History   Tobacco Use  . Smoking status: Former Smoker    Types: Cigarettes    Last attempt to quit: 10/2016    Years since quitting: 1.7  . Smokeless tobacco: Never Used  Substance Use Topics  . Alcohol use: Yes    Alcohol/week: 0.0 standard drinks    Comment: wine occassionally  . Drug use: No    Current Outpatient Medications:  .  albuterol (PROVENTIL HFA;VENTOLIN HFA) 108 (90 Base) MCG/ACT inhaler, Inhale 2 puffs into the lungs every 6 (six) hours as needed for wheezing or shortness of breath., Disp: 18 g, Rfl: 3 .  budesonide-formoterol (SYMBICORT) 160-4.5 MCG/ACT inhaler, Inhale 2 puffs into the lungs 2 (two) times daily., Disp: 1 Inhaler, Rfl: 5 .  busPIRone (BUSPAR) 15 MG tablet, TAKE 1 TABLET(15 MG) BY MOUTH THREE TIMES DAILY, Disp: 90 tablet, Rfl: 1 .  cetirizine (ZYRTEC) 10 MG tablet, Take 10 mg by  mouth daily., Disp: , Rfl:  .  diazepam (VALIUM) 2 MG tablet, Take 2 mg by mouth every 6 (six) hours as needed for anxiety., Disp: , Rfl:  .  diclofenac (VOLTAREN) 75 MG EC tablet, Take 1 tablet (75 mg total) by mouth 2 (two) times daily., Disp: 60 tablet, Rfl: 0 .  fexofenadine-pseudoephedrine (ALLEGRA-D) 60-120 MG 12 hr tablet, Take by mouth., Disp: , Rfl:  .  fluticasone (FLONASE) 50 MCG/ACT nasal spray, Place 1 spray into both nostrils daily. (Patient taking differently: Place 2 sprays into both nostrils daily. ), Disp: 15.8 g, Rfl: 5 .  hydrochlorothiazide (MICROZIDE) 12.5 MG capsule, Take 1 capsule (12.5 mg total) by mouth daily., Disp: 30 capsule, Rfl: 0 .  montelukast (SINGULAIR) 10 MG tablet, TAKE 1 TABLET AT BEDTIME, Disp: 90 tablet, Rfl: 1 .  Multiple Vitamin (MULTIVITAMIN) capsule, Take by mouth., Disp: , Rfl:  .  mupirocin ointment (BACTROBAN) 2 %, Place 1 application into the nose 2 (two) times daily., Disp: 22  g, Rfl: 0 .  omeprazole (PRILOSEC) 40 MG capsule, omeprazole 40 mg capsule,delayed release, Disp: , Rfl:  .  pantoprazole (PROTONIX) 40 MG tablet, TK 1 T PO ONCE D, Disp: , Rfl: 2 .  tamsulosin (FLOMAX) 0.4 MG CAPS capsule, Take 1 capsule (0.4 mg total) by mouth daily. For kidney stone (Patient not taking: Reported on 07/09/2018), Disp: 30 capsule, Rfl: 0 No current facility-administered medications for this visit.   Facility-Administered Medications Ordered in Other Visits:  .  [COMPLETED] sodium chloride 0.9 % nebulizer solution 3 mL, 3 mL, Nebulization, Once, 3 mL at 02/18/17 0848 **FOLLOWED BY** [COMPLETED] methacholine (PROVOCHOLINE) inhaler solution 0.125 mg, 2 mL, Inhalation, Once, 0.125 mg at 02/18/17 0848 **FOLLOWED BY** [COMPLETED] methacholine (PROVOCHOLINE) inhaler solution 0.5 mg, 2 mL, Inhalation, Once, 0.5 mg at 02/18/17 0855 **FOLLOWED BY** [COMPLETED] methacholine (PROVOCHOLINE) inhaler solution 2 mg, 2 mL, Inhalation, Once, 2 mg at 02/18/17 0902 **FOLLOWED BY** methacholine (PROVOCHOLINE) inhaler solution 8 mg, 2 mL, Inhalation, Once **FOLLOWED BY** methacholine (PROVOCHOLINE) inhaler solution 32 mg, 2 mL, Inhalation, Once **FOLLOWED BY** albuterol (PROVENTIL) (2.5 MG/3ML) 0.083% nebulizer solution 2.5 mg, 2.5 mg, Nebulization, Once, Crecencio Mc, MD Allergies: Morphine and related; Tramadol hcl; Buprenorphine hcl; and Prednisone  Review of Systems  Constitutional: Negative for chills, fever and malaise/fatigue.  HENT: Negative for congestion, sinus pain and sore throat.   Eyes: Negative for blurred vision and pain.  Respiratory: Negative for cough and wheezing.   Cardiovascular: Negative for chest pain and leg swelling.  Gastrointestinal: Negative for abdominal pain, constipation, diarrhea, heartburn, nausea and vomiting.  Genitourinary: Negative for dysuria, frequency, hematuria and urgency.  Musculoskeletal: Negative for back pain, joint pain, myalgias and neck pain.  Skin:  Negative for itching and rash.  Neurological: Negative for dizziness, tremors and weakness.  Endo/Heme/Allergies: Does not bruise/bleed easily.  Psychiatric/Behavioral: Negative for depression. The patient is not nervous/anxious and does not have insomnia.     Objective: BP 120/80   Ht 5\' 8"  (1.727 m)   Wt 290 lb (131.5 kg)   LMP 07/20/2018   BMI 44.09 kg/m   Filed Weights   08/04/18 1520  Weight: 290 lb (131.5 kg)   Body mass index is 44.09 kg/m. Physical Exam  Constitutional: She is oriented to person, place, and time. She appears well-developed and well-nourished. No distress.  Genitourinary: Rectum normal, vagina normal and uterus normal. Pelvic exam was performed with patient supine. There is no rash or lesion on the right labia. There is  no rash or lesion on the left labia. Vagina exhibits no lesion. No bleeding in the vagina. Right adnexum does not display mass and does not display tenderness. Left adnexum does not display mass and does not display tenderness. Cervix does not exhibit motion tenderness, lesion, friability or polyp.   Uterus is mobile and midaxial. Uterus is not enlarged or exhibiting a mass.  HENT:  Head: Normocephalic and atraumatic. Head is without laceration.  Right Ear: Hearing normal.  Left Ear: Hearing normal.  Nose: No epistaxis.  No foreign bodies.  Mouth/Throat: Uvula is midline, oropharynx is clear and moist and mucous membranes are normal.  Eyes: Pupils are equal, round, and reactive to light.  Neck: Normal range of motion. Neck supple. No thyromegaly present.  Cardiovascular: Normal rate and regular rhythm. Exam reveals no gallop and no friction rub.  No murmur heard. Pulmonary/Chest: Effort normal and breath sounds normal. No respiratory distress. She has no wheezes. Right breast exhibits no mass, no skin change and no tenderness. Left breast exhibits no mass, no skin change and no tenderness.  Abdominal: Soft. Bowel sounds are normal. She  exhibits no distension. There is no tenderness. There is no rebound.  Musculoskeletal: Normal range of motion.  Neurological: She is alert and oriented to person, place, and time. No cranial nerve deficit.  Skin: Skin is warm and dry.  Psychiatric: She has a normal mood and affect. Judgment normal.  Vitals reviewed.   Assessment:  ANNUAL EXAM 1. Annual physical exam   2. Screening for cervical cancer   3. Menorrhagia with regular cycle   4. Dysmenorrhea    Screening Plan:            1.  Cervical Screening-  Pap smear done today  2. Breast screening- Exam annually and mammogram>40 planned   3. Colonoscopy every 10 years, Hemoccult testing - after age 61  4. Labs managed by PCP  5. Counseling for contraception: vasectomy   6. Menorrhagia with regular cycle - EMB - Surgical pathology - Patient has abnormal uterine bleeding. Treatment option for menorrhagia or menometrorrhagia discussed in great detail with the patient.  Options include hormonal therapy, IUD therapy such as Mirena, D&C, Ablation, and Hysterectomy.  The pros and cons of each option discussed with patient. - Patient was told that it is normal to have menstrual bleeding after an endometrial ablation, only about 60-800% of patients become amenorrheic, 10% of patients have normal or light periods, and 10% of patients have no change in their bleeding pattern and may need further intervention.  She was told she will observe her periods for a few months after her ablation to see what her periods will be like; it is recommended to wait until at least three months after the procedure before making conclusions about how periods are going to be like after an ablation.  May schedule after next period for Nov 6.  7. Dysmenorrhea   Endometrial Biopsy After discussion with the patient regarding her abnormal uterine bleeding I recommended that she proceed with an endometrial biopsy for further diagnosis. The risks, benefits,  alternatives, and indications for an endometrial biopsy were discussed with the patient in detail. She understood the risks including infection, bleeding, cervical laceration and uterine perforation.  Verbal consent was obtained.   PROCEDURE NOTE:  Pipelle endometrial biopsy was performed using aseptic technique with iodine preparation.  The uterus was sounded to a length of 7 cm.  Adequate sampling was obtained with minimal blood loss.  The patient  tolerated the procedure well.  Disposition will be pending pathology.    F/U    For procedure  Return in about 1 year (around 08/05/2019) for Annual.  Barnett Applebaum, MD, [facility], [facility] 08/04/2018  4:04 PM

## 2018-08-05 ENCOUNTER — Telehealth: Payer: Self-pay | Admitting: Obstetrics & Gynecology

## 2018-08-05 NOTE — Telephone Encounter (Signed)
Patient is aware of H&P at [facility] on Thursday, 08/20/18 @ 4:10pm w/ Dr Kenton Kingfisher, and procedure on Wednesday, 08/26/18 @ 9:50am.

## 2018-08-05 NOTE — Telephone Encounter (Signed)
-----   Message from Gae Dry, MD sent at 08/04/2018  4:02 PM EDT ----- Regarding: procedure Surgery Booking Request Patient Full Name:  Molly Stone  MRN: [id number]  DOB: 1981/08/27  Surgeon: Hoyt Koch, MD  Requested Surgery Date and Time: NOV 6 Primary Diagnosis AND Code: Menorrhagia, Dysmenorrhea Secondary Diagnosis and Code:  Surgical Procedure: IN OFFICE ENDOMETRIAL ABLATION w HYSTEROSCOPY L&D Notification: No Admission Status: OFFICE Length of Surgery: 30 min Special Case Needs: Roselyn Reef H&P: yes (date)

## 2018-08-05 NOTE — Telephone Encounter (Signed)
Procedure costs and [employer] info were discussed w/ patient.

## 2018-08-06 ENCOUNTER — Encounter: Payer: Self-pay | Admitting: *Deleted

## 2018-08-07 ENCOUNTER — Encounter
Admission: RE | Disposition: A | Discharge status: Home / Self Care | Payer: Self-pay | Source: Ambulatory Visit | Attending: Gastroenterology

## 2018-08-07 ENCOUNTER — Encounter: Payer: Self-pay | Admitting: *Deleted

## 2018-08-07 ENCOUNTER — Other Ambulatory Visit: Payer: Self-pay

## 2018-08-07 ENCOUNTER — Ambulatory Visit
Admission: RE | Admit: 2018-08-07 | Discharge: 2018-08-07 | Disposition: A | Discharge status: Home / Self Care | Payer: Commercial Managed Care - PPO | Source: Ambulatory Visit | Attending: Gastroenterology | Admitting: Gastroenterology

## 2018-08-07 ENCOUNTER — Ambulatory Visit: Payer: Commercial Managed Care - PPO | Admitting: Certified Registered Nurse Anesthetist

## 2018-08-07 DIAGNOSIS — G4730 Sleep apnea, unspecified: Secondary | ICD-10-CM | POA: Diagnosis not present

## 2018-08-07 DIAGNOSIS — Z6841 Body Mass Index (BMI) 40.0 and over, adult: Secondary | ICD-10-CM | POA: Diagnosis not present

## 2018-08-07 DIAGNOSIS — J45909 Unspecified asthma, uncomplicated: Secondary | ICD-10-CM | POA: Diagnosis not present

## 2018-08-07 DIAGNOSIS — K228 Other specified diseases of esophagus: Secondary | ICD-10-CM | POA: Diagnosis not present

## 2018-08-07 DIAGNOSIS — K219 Gastro-esophageal reflux disease without esophagitis: Secondary | ICD-10-CM | POA: Diagnosis not present

## 2018-08-07 DIAGNOSIS — I10 Essential (primary) hypertension: Secondary | ICD-10-CM | POA: Diagnosis not present

## 2018-08-07 DIAGNOSIS — K222 Esophageal obstruction: Secondary | ICD-10-CM | POA: Diagnosis not present

## 2018-08-07 DIAGNOSIS — E669 Obesity, unspecified: Secondary | ICD-10-CM | POA: Insufficient documentation

## 2018-08-07 DIAGNOSIS — Z79899 Other long term (current) drug therapy: Secondary | ICD-10-CM | POA: Diagnosis not present

## 2018-08-07 DIAGNOSIS — K449 Diaphragmatic hernia without obstruction or gangrene: Secondary | ICD-10-CM

## 2018-08-07 DIAGNOSIS — F419 Anxiety disorder, unspecified: Secondary | ICD-10-CM | POA: Insufficient documentation

## 2018-08-07 DIAGNOSIS — Z87891 Personal history of nicotine dependence: Secondary | ICD-10-CM | POA: Insufficient documentation

## 2018-08-07 DIAGNOSIS — R1310 Dysphagia, unspecified: Secondary | ICD-10-CM

## 2018-08-07 HISTORY — PX: ESOPHAGOGASTRODUODENOSCOPY (EGD) WITH PROPOFOL: SHX5813

## 2018-08-07 SURGERY — ESOPHAGOGASTRODUODENOSCOPY (EGD) WITH PROPOFOL
Anesthesia: General

## 2018-08-07 MED ORDER — PROPOFOL 10 MG/ML IV BOLUS
INTRAVENOUS | Status: DC | PRN
  Administered 2018-08-07: 70 mg via INTRAVENOUS

## 2018-08-07 MED ORDER — LIDOCAINE HCL (CARDIAC) PF 100 MG/5ML IV SOSY
PREFILLED_SYRINGE | INTRAVENOUS | Status: DC | PRN
  Administered 2018-08-07: 50 mg via INTRAVENOUS

## 2018-08-07 MED ORDER — SODIUM CHLORIDE 0.9 % IV SOLN
INTRAVENOUS | Status: DC
  Administered 2018-08-07: 10:00:00 via INTRAVENOUS

## 2018-08-07 MED ORDER — PROPOFOL 500 MG/50ML IV EMUL
INTRAVENOUS | Status: DC | PRN
  Administered 2018-08-07: 175 ug/kg/min via INTRAVENOUS

## 2018-08-07 NOTE — Anesthesia Post-op Follow-up Note (Signed)
Anesthesia QCDR form completed.        

## 2018-08-07 NOTE — H&P (Signed)
Jonathon Bellows, MD [address] [place], [place], [place], [address] [address], [place], [place], [place], [address] Phone: [phone]  Fax: [phone]  Primary Care Physician:  Crecencio Mc, MD   Pre-Procedure History & Physical: HPI:  Molly Stone is a 37 y.o. female is here for an endoscopy    Past Medical History:  Diagnosis Date  . Allergy   . Anemia   . Anxiety   . Asthma   . Chlamydia    h/o  . Disorder of vocal cord    spasmodic dysphonia  . Dysmenorrhea   . Dyspareunia, female   . Endometriosis   . Family history of endometriosis   . GERD (gastroesophageal reflux disease)   . Headache    migraine  . Heavy periods   . History of nephrolithiasis   . Hypertension   . Jaundice, physiologic, newborn   . Obesity (BMI 30-39.9)   . Sleep apnea   . Tobacco user   . Vaginal Pap smear, abnormal     Past Surgical History:  Procedure Laterality Date  . DILATION AND CURETTAGE OF UTERUS    . laparoscopy    . WISDOM TOOTH EXTRACTION      Prior to Admission medications   Medication Sig Start Date End Date Taking? Authorizing Provider  albuterol (PROVENTIL HFA;VENTOLIN HFA) 108 (90 Base) MCG/ACT inhaler Inhale 2 puffs into the lungs every 6 (six) hours as needed for wheezing or shortness of breath. 03/24/17  Yes Kasa, Maretta Bees, MD  budesonide-formoterol (SYMBICORT) 160-4.5 MCG/ACT inhaler Inhale 2 puffs into the lungs 2 (two) times daily. 03/24/17  Yes Flora Lipps, MD  diazepam (VALIUM) 2 MG tablet Take 2 mg by mouth every 6 (six) hours as needed for anxiety.   Yes [provider]  fluticasone (FLONASE) 50 MCG/ACT nasal spray Place 1 spray into both nostrils daily. Patient taking differently: Place 2 sprays into both nostrils daily.  07/25/17  Yes Crecencio Mc, MD  montelukast (SINGULAIR) 10 MG tablet TAKE 1 TABLET AT BEDTIME 06/02/18  Yes Crecencio Mc, MD  Multiple Vitamin (MULTIVITAMIN) capsule Take by mouth.   Yes [provider]   pantoprazole (PROTONIX) 40 MG tablet TK 1 T PO ONCE D 06/01/18  Yes [provider]  busPIRone (BUSPAR) 15 MG tablet TAKE 1 TABLET(15 MG) BY MOUTH THREE TIMES DAILY Patient not taking: Reported on 08/07/2018 02/26/18   Crecencio Mc, MD  cetirizine (ZYRTEC) 10 MG tablet Take 10 mg by mouth daily.    [provider]  diclofenac (VOLTAREN) 75 MG EC tablet Take 1 tablet (75 mg total) by mouth 2 (two) times daily. Patient not taking: Reported on 08/07/2018 03/25/18   Crecencio Mc, MD  fexofenadine-pseudoephedrine (ALLEGRA-D) 60-120 MG 12 hr tablet Take by mouth.    [provider]  hydrochlorothiazide (MICROZIDE) 12.5 MG capsule Take 1 capsule (12.5 mg total) by mouth daily. 06/09/18 07/09/18  Menshew, Dannielle Karvonen, PA-C  mupirocin ointment (BACTROBAN) 2 % Place 1 application into the nose 2 (two) times daily. Patient not taking: Reported on 08/07/2018 02/12/18   Delano Metz, FNP  omeprazole (PRILOSEC) 40 MG capsule omeprazole 40 mg capsule,delayed release 01/04/16   [provider]  tamsulosin (FLOMAX) 0.4 MG CAPS capsule Take 1 capsule (0.4 mg total) by mouth daily. For kidney stone Patient not taking: Reported on 07/09/2018 04/13/18   Crecencio Mc, MD    Allergies as of 07/09/2018 - Review Complete 07/09/2018  Allergen  Reaction Noted  . Morphine and related Itching 03/18/2012  . Tramadol hcl  06/11/2017  . Buprenorphine hcl Itching 08/18/2015  . Prednisone Anxiety 06/14/2014    Family History  Problem Relation Age of Onset  . Endometriosis Mother   . Cancer Father        benign liver CA  . Stroke Father        recurrent blood clots on the brain   . Cancer Maternal Grandmother 30       ovarian Ca, still surviving   . Osteoporosis Maternal Grandmother   . Ovarian cancer Maternal Grandmother   . Heart disease Maternal Grandfather   . Cancer Maternal Grandfather 36       colon CA. metastatic  . Diabetes Maternal Uncle   . Heart disease  Paternal Grandmother   . Breast cancer Neg Hx   . Colon cancer Neg Hx   . Kidney cancer Neg Hx   . Bladder Cancer Neg Hx     Social History   Socioeconomic History  . Marital status: Married    Spouse name: Not on file  . Number of children: Not on file  . Years of education: Not on file  . Highest education level: Not on file  Occupational History  . Not on file  Social Needs  . Financial resource strain: Not on file  . Food insecurity:    Worry: Not on file    Inability: Not on file  . Transportation needs:    Medical: Not on file    Non-medical: Not on file  Tobacco Use  . Smoking status: Former Smoker    Types: Cigarettes    Last attempt to quit: 10/2016    Years since quitting: 1.7  . Smokeless tobacco: Never Used  Substance and Sexual Activity  . Alcohol use: Yes    Alcohol/week: 0.0 standard drinks    Comment: wine occassionally  . Drug use: No  . Sexual activity: Yes    Birth control/protection: None    Comment: vasectomy  Lifestyle  . Physical activity:    Days per week: Not on file    Minutes per session: Not on file  . Stress: Not on file  Relationships  . Social connections:    Talks on phone: Not on file    Gets together: Not on file    Attends religious service: Not on file    Active member of club or organization: Not on file    Attends meetings of clubs or organizations: Not on file    Relationship status: Not on file  . Intimate partner violence:    Fear of current or ex partner: Not on file    Emotionally abused: Not on file    Physically abused: Not on file    Forced sexual activity: Not on file  Other Topics Concern  . Not on file  Social History Narrative  . Not on file    Review of Systems: See HPI, otherwise negative ROS  Physical Exam: BP 135/81   Pulse (!) 103   Temp (!) 97.3 F (36.3 C) (Tympanic)   Resp 18   Ht 5\' 8"  (1.727 m)   Wt 131.5 kg   LMP 07/20/2018 Comment: negative preg test 08/07/18  SpO2 95%   BMI 44.09  kg/m  General:   Alert,  pleasant and cooperative in NAD Head:  Normocephalic and atraumatic. Neck:  Supple; no masses or thyromegaly. Lungs:  Clear throughout to auscultation, normal respiratory effort.  Heart:  +S1, +S2, Regular rate and rhythm, No edema. Abdomen:  Soft, nontender and nondistended. Normal bowel sounds, without guarding, and without rebound.   Neurologic:  Alert and  oriented x4;  grossly normal neurologically.  Impression/Plan: Molly Stone is here for an endoscopy  to be performed for  evaluation of dysphagia    Risks, benefits, limitations, and alternatives regarding endoscopy+/- dilation  have been reviewed with the patient.  Questions have been answered.  All parties agreeable.   Jonathon Bellows, MD  08/07/2018, 10:43 AM\

## 2018-08-07 NOTE — Anesthesia Procedure Notes (Signed)
Date/Time: 08/07/2018 10:51 AM Performed by: Johnna Acosta, CRNA Pre-anesthesia Checklist: Patient identified, Emergency Drugs available, Suction available, Patient being monitored and Timeout performed Patient Re-evaluated:Patient Re-evaluated prior to induction Oxygen Delivery Method: Nasal cannula Preoxygenation: Pre-oxygenation with 100% oxygen Induction Type: IV induction

## 2018-08-07 NOTE — Transfer of Care (Signed)
Immediate Anesthesia Transfer of Care Note  Patient: Molly Stone  Procedure(s) Performed: ESOPHAGOGASTRODUODENOSCOPY (EGD) WITH PROPOFOL (N/A )  Patient Location: PACU  Anesthesia Type:General  Level of Consciousness: awake and alert   Airway & Oxygen Therapy: Patient Spontanous Breathing and Patient connected to nasal cannula oxygen  Post-op Assessment: Report given to RN and Post -op Vital signs reviewed and stable  Post vital signs: Reviewed and stable  Last Vitals:  Vitals Value Taken Time  BP 120/85 08/07/2018 11:08 AM  Temp 36.4 C 08/07/2018 11:08 AM  Pulse 87 08/07/2018 11:10 AM  Resp 20 08/07/2018 11:10 AM  SpO2 97 % 08/07/2018 11:10 AM  Vitals shown include unvalidated device data.  Last Pain:  Vitals:   08/07/18 1108  TempSrc: Tympanic  PainSc: 0-No pain         Complications: No apparent anesthesia complications

## 2018-08-07 NOTE — Op Note (Signed)
[facility] Gastroenterology Patient Name: Molly Stone Procedure Date: 08/07/2018 10:49 AM MRN: [id number] Account #: [id number] Date of Birth: 06-Nov-1980 Admit Type: Outpatient Age: 37 Room: [facility] ENDO ROOM 4 Gender: Female Note Status: Finalized Procedure:            Upper GI endoscopy Indications:          Dysphagia Providers:            Jonathon Bellows MD, MD Referring MD:         Deborra Medina, MD (Referring MD) Medicines:            Monitored Anesthesia Care Complications:        No immediate complications. Procedure:            Pre-Anesthesia Assessment:                       - Prior to the procedure, a History and Physical was                        performed, and patient medications, allergies and                        sensitivities were reviewed. The patient's tolerance of                        previous anesthesia was reviewed.                       - The risks and benefits of the procedure and the                        sedation options and risks were discussed with the                        patient. All questions were answered and informed                        consent was obtained.                       - ASA Grade Assessment: II - A patient with mild                        systemic disease.                       After obtaining informed consent, the endoscope was                        passed under direct vision. Throughout the procedure,                        the patient's blood pressure, pulse, and oxygen                        saturations were monitored continuously. The Endoscope                        was introduced through the mouth, and advanced to the  third part of duodenum. The upper GI endoscopy was                        accomplished with ease. The patient tolerated the                        procedure well. Findings:      The stomach was normal.      The examined duodenum was normal.      A 3 cm hiatal hernia  was present.      One benign-appearing, intrinsic mild stenosis was found at the       gastroesophageal junction. This stenosis measured 1.6 cm (inner       diameter) x 1 cm (in length). The stenosis was traversed. A TTS dilator       was passed through the scope. Dilation with a 15-16.5-18 mm balloon       dilator was performed to 18 mm. The dilation site was examined following       endoscope reinsertion and showed no change.      The cardia and gastric fundus were normal on retroflexion.      Normal mucosa was found in the entire esophagus. Biopsies were taken       with a cold forceps for histology. Impression:           - Normal stomach.                       - Normal examined duodenum.                       - 3 cm hiatal hernia.                       - Benign-appearing esophageal stenosis. Dilated.                       - No specimens collected. Recommendation:       - Discharge patient to home (with escort).                       - Resume previous diet.                       - Continue present medications.                       - Await pathology results.                       - Return to my office in 2 weeks. Procedure Code(s):    --- Professional ---                       [phone], Esophagogastroduodenoscopy, flexible, transoral;                        with transendoscopic balloon dilation of esophagus                        (less than 30 mm diameter)                       43239, 59, Esophagogastroduodenoscopy, flexible,  transoral; with biopsy, single or multiple Diagnosis Code(s):    --- Professional ---                       K44.9, Diaphragmatic hernia without obstruction or                        gangrene                       K22.2, Esophageal obstruction                       R13.10, Dysphagia, unspecified CPT copyright 2018 [employer]. All rights reserved. The codes documented in this report are preliminary and upon coder review may   be revised to meet current compliance requirements. Jonathon Bellows, MD Jonathon Bellows MD, MD 08/07/2018 11:06:05 AM This report has been signed electronically. Number of Addenda: 0 Note Initiated On: 08/07/2018 10:49 AM      [facility]

## 2018-08-09 NOTE — Anesthesia Preprocedure Evaluation (Signed)
Anesthesia Evaluation  Patient identified by MRN, date of birth, ID band Patient awake    Reviewed: Allergy & Precautions, H&P , NPO status , Patient's Chart, lab work & pertinent test results  Airway Mallampati: III       Dental   Pulmonary neg pulmonary ROS, asthma , sleep apnea , former smoker,           Cardiovascular hypertension, negative cardio ROS       Neuro/Psych  Headaches, PSYCHIATRIC DISORDERS Anxiety  Neuromuscular disease (sciatica) negative neurological ROS  negative psych ROS   GI/Hepatic negative GI ROS, Neg liver ROS, GERD  ,  Endo/Other  negative endocrine ROS  Renal/GU negative Renal ROS  negative genitourinary   Musculoskeletal   Abdominal   Peds  Hematology negative hematology ROS (+) Blood dyscrasia, anemia ,   Anesthesia Other Findings Past Medical History: No date: Allergy No date: Anemia No date: Anxiety No date: Asthma No date: Chlamydia     Comment:  h/o No date: Disorder of vocal cord     Comment:  spasmodic dysphonia No date: Dysmenorrhea No date: Dyspareunia, female No date: Endometriosis No date: Family history of endometriosis No date: GERD (gastroesophageal reflux disease) No date: Headache     Comment:  migraine No date: Heavy periods No date: History of nephrolithiasis No date: Hypertension No date: Jaundice, physiologic, newborn No date: Obesity (BMI 30-39.9) No date: Sleep apnea No date: Tobacco user No date: Vaginal Pap smear, abnormal  Past Surgical History: No date: DILATION AND CURETTAGE OF UTERUS No date: laparoscopy No date: WISDOM TOOTH EXTRACTION  BMI    Body Mass Index:  44.09 kg/m      Reproductive/Obstetrics negative OB ROS                             Anesthesia Physical Anesthesia Plan  ASA: III  Anesthesia Plan: General   Post-op Pain Management:    Induction:   PONV Risk Score and Plan: Propofol infusion  and TIVA  Airway Management Planned:   Additional Equipment:   Intra-op Plan:   Post-operative Plan:   Informed Consent: I have reviewed the patients History and Physical, chart, labs and discussed the procedure including the risks, benefits and alternatives for the proposed anesthesia with the patient or authorized representative who has indicated his/her understanding and acceptance.   Dental Advisory Given  Plan Discussed with: Anesthesiologist, CRNA and Surgeon  Anesthesia Plan Comments:         Anesthesia Quick Evaluation

## 2018-08-09 NOTE — Anesthesia Postprocedure Evaluation (Signed)
Anesthesia Post Note  Patient: Molly Stone  Procedure(s) Performed: ESOPHAGOGASTRODUODENOSCOPY (EGD) WITH PROPOFOL (N/A )  Patient location during evaluation: PACU Anesthesia Type: General Level of consciousness: awake and alert Pain management: pain level controlled Vital Signs Assessment: post-procedure vital signs reviewed and stable Respiratory status: spontaneous breathing, nonlabored ventilation and respiratory function stable Cardiovascular status: blood pressure returned to baseline and stable Postop Assessment: no apparent nausea or vomiting Anesthetic complications: no     Last Vitals:  Vitals:   08/07/18 1118 08/07/18 1128  BP: 131/84 126/74  Pulse: 89 92  Resp: (!) 23 15  Temp:    SpO2: 98% 99%    Last Pain:  Vitals:   08/08/18 0949  TempSrc:   PainSc: [place]

## 2018-08-10 ENCOUNTER — Encounter: Payer: Self-pay | Admitting: Gastroenterology

## 2018-08-10 LAB — SURGICAL PATHOLOGY

## 2018-08-11 LAB — CYTOLOGY - PAP: Diagnosis: NEGATIVE

## 2018-08-16 ENCOUNTER — Encounter: Payer: Self-pay | Admitting: Gastroenterology

## 2018-08-17 ENCOUNTER — Encounter: Payer: Self-pay | Admitting: Gastroenterology

## 2018-08-17 ENCOUNTER — Ambulatory Visit: Payer: Commercial Managed Care - PPO | Admitting: Gastroenterology

## 2018-08-17 VITALS — BP 118/80 | HR 93 | Ht 68.0 in | Wt 295.8 lb

## 2018-08-17 DIAGNOSIS — K219 Gastro-esophageal reflux disease without esophagitis: Secondary | ICD-10-CM | POA: Diagnosis not present

## 2018-08-17 MED ORDER — PANTOPRAZOLE SODIUM 40 MG PO TBEC: 40.0000 mg | DELAYED_RELEASE_TABLET | Freq: Two times a day (BID) | ORAL | 2 refills | Status: DC

## 2018-08-17 NOTE — Progress Notes (Signed)
Jonathon Bellows MD, MRCP(U.K) [address]  [place]  [place], [place] [address]  Main: [phone]  Fax: [phone]   Primary Care Physician: Crecencio Mc, MD  Primary Gastroenterologist:  Dr. Jonathon Bellows   No chief complaint on file.   HPI: SYMPHONI HELBLING is a 37 y.o. female  Summary of history :  Here to see me as a follow up for GERD. Since 2004 . Since 2004 has gained atleast 20 lbs.   She took  40 mg protonix once a day before breakfast ,does wake up in the night at times with symptoms. No family history of esophageal cancer. She says she has had issues with swallowing for "years" , sporadic, once a month, affects her "spit" , food also gets stuck and doe snot go down. Liquids go down fine. She has seasonal allergies.    Interval history   07/09/2018-  08/17/2018  08/07/18: EGD: 3 cm hiatal hernia seen , schatzkis ring seen , dilated with 18 F dilator with minimal effect . Esophageal biopsies showed inflammation and no EOE.    After increasing her dose of PPI to BID all her symptoms have resolved.  Current Outpatient Medications  Medication Sig Dispense Refill  . albuterol (PROVENTIL HFA;VENTOLIN HFA) 108 (90 Base) MCG/ACT inhaler Inhale 2 puffs into the lungs every 6 (six) hours as needed for wheezing or shortness of breath. 18 g 3  . budesonide-formoterol (SYMBICORT) 160-4.5 MCG/ACT inhaler Inhale 2 puffs into the lungs 2 (two) times daily. 1 Inhaler 5  . busPIRone (BUSPAR) 15 MG tablet TAKE 1 TABLET(15 MG) BY MOUTH THREE TIMES DAILY (Patient not taking: Reported on 08/07/2018) 90 tablet 1  . cetirizine (ZYRTEC) 10 MG tablet Take 10 mg by mouth daily.    . diazepam (VALIUM) 2 MG tablet Take 2 mg by mouth every 6 (six) hours as needed for anxiety.    . diclofenac (VOLTAREN) 75 MG EC tablet Take 1 tablet (75 mg total) by mouth 2 (two) times daily. (Patient not taking: Reported on 08/07/2018) 60 tablet 0  . fexofenadine-pseudoephedrine (ALLEGRA-D) 60-120 MG  12 hr tablet Take by mouth.    . fluticasone (FLONASE) 50 MCG/ACT nasal spray Place 1 spray into both nostrils daily. (Patient taking differently: Place 2 sprays into both nostrils daily. ) 15.8 g 5  . hydrochlorothiazide (MICROZIDE) 12.5 MG capsule Take 1 capsule (12.5 mg total) by mouth daily. 30 capsule 0  . montelukast (SINGULAIR) 10 MG tablet TAKE 1 TABLET AT BEDTIME 90 tablet 1  . Multiple Vitamin (MULTIVITAMIN) capsule Take by mouth.    . mupirocin ointment (BACTROBAN) 2 % Place 1 application into the nose 2 (two) times daily. (Patient not taking: Reported on 08/07/2018) 22 g 0  . omeprazole (PRILOSEC) 40 MG capsule omeprazole 40 mg capsule,delayed release    . pantoprazole (PROTONIX) 40 MG tablet TK 1 T PO ONCE D  2  . tamsulosin (FLOMAX) 0.4 MG CAPS capsule Take 1 capsule (0.4 mg total) by mouth daily. For kidney stone (Patient not taking: Reported on 07/09/2018) 30 capsule 0   No current facility-administered medications for this visit.    Facility-Administered Medications Ordered in Other Visits  Medication Dose Route Frequency Provider Last Rate Last Dose  . methacholine (PROVOCHOLINE) inhaler solution 8 mg  2 mL Inhalation Once Crecencio Mc, MD       Followed by  . methacholine (PROVOCHOLINE) inhaler solution 32 mg  2 mL Inhalation Once Crecencio Mc, MD  Followed by  . albuterol (PROVENTIL) (2.5 MG/3ML) 0.083% nebulizer solution 2.5 mg  2.5 mg Nebulization Once Crecencio Mc, MD        Allergies as of 08/17/2018 - Review Complete 08/07/2018  Allergen Reaction Noted  . Buprenorphine hcl Itching 08/18/2015  . Morphine and related Itching 03/18/2012  . Prednisone Anxiety 06/14/2014  . Tramadol hcl Other (See Comments) 06/11/2017    ROS:  General: Negative for anorexia, weight loss, fever, chills, fatigue, weakness. ENT: Negative for hoarseness, difficulty swallowing , nasal congestion. CV: Negative for chest pain, angina, palpitations, dyspnea on exertion,  peripheral edema.  Respiratory: Negative for dyspnea at rest, dyspnea on exertion, cough, sputum, wheezing.  GI: See history of present illness. GU:  Negative for dysuria, hematuria, urinary incontinence, urinary frequency, nocturnal urination.  Endo: Negative for unusual weight change.    Physical Examination:   LMP 07/20/2018   General: Well-nourished, well-developed in no acute distress.  Eyes: No icterus. Conjunctivae pink. Mouth: Oropharyngeal mucosa moist and pink , no lesions erythema or exudate. Lungs: Clear to auscultation bilaterally. Non-labored. Heart: Regular rate and rhythm, no murmurs rubs or gallops.  Abdomen: Bowel sounds are normal, nontender, nondistended, no hepatosplenomegaly or masses, no abdominal bruits or hernia , no rebound or guarding.   Extremities: No lower extremity edema. No clubbing or deformities. Neuro: Alert and oriented x 3.  Grossly intact. Skin: Warm and dry, no jaundice.   Psych: Alert and cooperative, normal mood and affect.   Imaging Studies: No results found.  Assessment and Plan:   CHRISTIE VISCOMI is a 37 y.o. y/o female for GERD and dysphagia. All her symptoms have resolved after increasing Protonix to 40 mg BID. We discussed again about life style changes for GERD and need to lose weight . My plan is to decrease her dose of PPI at her next visit , in the interim she will try and lose weight to make my plan effective.    Dr Jonathon Bellows  MD,MRCP [facility]) Follow up in 6 months

## 2018-08-20 ENCOUNTER — Ambulatory Visit (INDEPENDENT_AMBULATORY_CARE_PROVIDER_SITE_OTHER): Payer: Commercial Managed Care - PPO | Admitting: Obstetrics & Gynecology

## 2018-08-20 ENCOUNTER — Encounter: Payer: Self-pay | Admitting: Obstetrics & Gynecology

## 2018-08-20 VITALS — BP 120/80 | Ht 68.0 in | Wt 290.0 lb

## 2018-08-20 DIAGNOSIS — N920 Excessive and frequent menstruation with regular cycle: Secondary | ICD-10-CM | POA: Diagnosis not present

## 2018-08-20 DIAGNOSIS — N946 Dysmenorrhea, unspecified: Secondary | ICD-10-CM

## 2018-08-20 MED ORDER — MEPERIDINE HCL 50 MG PO TABS: 100.0000 mg | ORAL_TABLET | Freq: Once | ORAL | 0 refills | Status: AC

## 2018-08-20 MED ORDER — MISOPROSTOL 200 MCG PO TABS: 200.0000 ug | ORAL_TABLET | Freq: Once | ORAL | 0 refills | Status: DC

## 2018-08-20 MED ORDER — DIAZEPAM 5 MG PO TABS: 5.0000 mg | ORAL_TABLET | Freq: Once | ORAL | 0 refills | Status: AC

## 2018-08-20 MED ORDER — HYDROCODONE-ACETAMINOPHEN 5-325 MG PO TABS: 1.0000 | ORAL_TABLET | Freq: Four times a day (QID) | ORAL | 0 refills | Status: DC | PRN

## 2018-08-20 MED ORDER — IBUPROFEN 600 MG PO TABS: 600.0000 mg | ORAL_TABLET | Freq: Four times a day (QID) | ORAL | 3 refills | Status: DC | PRN

## 2018-08-20 MED ORDER — PROMETHAZINE HCL 25 MG PO TABS: 25.0000 mg | ORAL_TABLET | Freq: Once | ORAL | 0 refills | Status: DC

## 2018-08-20 NOTE — Patient Instructions (Signed)
  Endometrial Ablation Pre-Procedural Instructions for Patient   You may have a light meal prior to coming to the office if desired.    You can plan on your appointment taking about 1 hour.  The actual procedure lasts for 1/2 hour but you will need to remain in the office for a short period after the procedure.    You may feel drowsy from the medication administered prior to and during the procedure. You should arrange for transportation to and from the office.    The following medications have been prescribed to you.  Please follow the doctor's instructions in taking these medications:  Day before Procedure    Cytotec 200 mg by mouth at bedtime    Ibuprofen 800 mg one by mouth three times a day for 2 days before procedure    Day of Procedure  Phenergan 25 mg  by mouth 1 hour before procedure Demorol 50mg  2 by mouth 1 hour before procedure Valium 5mg   1 by mouth 1 hour before procedure Norco 5/325 mg 1 to 2 by mouth every 4-5 hours as needed for pain Ibuprofen 800 mg 1 by mouth every 8 hours as needed for pain

## 2018-08-20 NOTE — Progress Notes (Signed)
HPI:  Molly Stone is a 37 y.o. [id number] ; she is being admitted for office surgery next week related to menorrhagia nd pain w menses.  Normal PAP and EMB.  Has FH DVT so wishes to avoid hormone treatments.  Recent CT pelvis no fibroids.  PMHx: Past Medical History:  Diagnosis Date  . Allergy   . Anemia   . Anxiety   . Asthma   . Chlamydia    h/o  . Disorder of vocal cord    spasmodic dysphonia  . Dysmenorrhea   . Dyspareunia, female   . Endometriosis   . Family history of endometriosis   . GERD (gastroesophageal reflux disease)   . Headache    migraine  . Heavy periods   . History of nephrolithiasis   . Hypertension   . Jaundice, physiologic, newborn   . Obesity (BMI 30-39.9)   . Sleep apnea   . Tobacco user   . Vaginal Pap smear, abnormal    Past Surgical History:  Procedure Laterality Date  . DILATION AND CURETTAGE OF UTERUS    . ESOPHAGOGASTRODUODENOSCOPY (EGD) WITH PROPOFOL N/A 08/07/2018   Procedure: ESOPHAGOGASTRODUODENOSCOPY (EGD) WITH PROPOFOL;  Surgeon: Jonathon Bellows, MD;  Location: [facility] ENDOSCOPY;  Service: Gastroenterology;  Laterality: N/A;  . laparoscopy    . WISDOM TOOTH EXTRACTION     Family History  Problem Relation Age of Onset  . Endometriosis Mother   . Cancer Father        benign liver CA  . Stroke Father        recurrent blood clots on the brain   . Cancer Maternal Grandmother 30       ovarian Ca, still surviving   . Osteoporosis Maternal Grandmother   . Ovarian cancer Maternal Grandmother   . Heart disease Maternal Grandfather   . Cancer Maternal Grandfather 43       colon CA. metastatic  . Diabetes Maternal Uncle   . Heart disease Paternal Grandmother   . Breast cancer Neg Hx   . Colon cancer Neg Hx   . Kidney cancer Neg Hx   . Bladder Cancer Neg Hx    Social History   Tobacco Use  . Smoking status: Former Smoker    Types: Cigarettes    Last attempt to quit: 10/2016    Years since quitting: 1.8  . Smokeless tobacco: Never  Used  Substance Use Topics  . Alcohol use: Yes    Alcohol/week: 0.0 standard drinks    Comment: wine occassionally  . Drug use: No    Current Outpatient Medications:  .  albuterol (PROVENTIL HFA;VENTOLIN HFA) 108 (90 Base) MCG/ACT inhaler, Inhale 2 puffs into the lungs every 6 (six) hours as needed for wheezing or shortness of breath., Disp: 18 g, Rfl: 3 .  budesonide-formoterol (SYMBICORT) 160-4.5 MCG/ACT inhaler, Inhale 2 puffs into the lungs 2 (two) times daily., Disp: 1 Inhaler, Rfl: 5 .  fluticasone (FLONASE) 50 MCG/ACT nasal spray, Place 1 spray into both nostrils daily. (Patient taking differently: Place 2 sprays into both nostrils daily. ), Disp: 15.8 g, Rfl: 5 .  montelukast (SINGULAIR) 10 MG tablet, TAKE 1 TABLET AT BEDTIME, Disp: 90 tablet, Rfl: 1 .  Multiple Vitamin (MULTIVITAMIN) capsule, Take by mouth., Disp: , Rfl:  .  mupirocin ointment (BACTROBAN) 2 %, Place 1 application into the nose 2 (two) times daily., Disp: 22 g, Rfl: 0 .  pantoprazole (PROTONIX) 40 MG tablet, Take 1 tablet (40 mg total) by mouth 2 (two) times  daily., Disp: 60 tablet, Rfl: 2 .  busPIRone (BUSPAR) 15 MG tablet, TAKE 1 TABLET(15 MG) BY MOUTH THREE TIMES DAILY (Patient not taking: Reported on 08/20/2018), Disp: 90 tablet, Rfl: 1 .  cetirizine (ZYRTEC) 10 MG tablet, Take 10 mg by mouth daily., Disp: , Rfl:  .  diazepam (VALIUM) 2 MG tablet, Take 2 mg by mouth every 6 (six) hours as needed for anxiety., Disp: , Rfl:  .  diazepam (VALIUM) 5 MG tablet, Take 1 tablet (5 mg total) by mouth once for 1 dose. One hour prior to procedure, Disp: 2 tablet, Rfl: 0 .  diclofenac (VOLTAREN) 75 MG EC tablet, Take 1 tablet (75 mg total) by mouth 2 (two) times daily., Disp: 60 tablet, Rfl: 0 .  fexofenadine-pseudoephedrine (ALLEGRA-D) 60-120 MG 12 hr tablet, Take by mouth., Disp: , Rfl:  .  hydrochlorothiazide (MICROZIDE) 12.5 MG capsule, Take 1 capsule (12.5 mg total) by mouth daily., Disp: 30 capsule, Rfl: 0 .   HYDROcodone-acetaminophen (NORCO) 5-325 MG tablet, Take 1 tablet by mouth every 6 (six) hours as needed for moderate pain., Disp: 10 tablet, Rfl: 0 .  ibuprofen (ADVIL,MOTRIN) 600 MG tablet, Take 1 tablet (600 mg total) by mouth every 6 (six) hours as needed for moderate pain., Disp: 60 tablet, Rfl: 3 .  meperidine (DEMEROL) 50 MG tablet, Take 2 tablets (100 mg total) by mouth once for 1 dose. One hour prior to procedure., Disp: 2 tablet, Rfl: 0 .  misoprostol (CYTOTEC) 200 MCG tablet, Take 1 tablet (200 mcg total) by mouth once for 1 dose. Night before procedure, Disp: 1 tablet, Rfl: 0 .  omeprazole (PRILOSEC) 40 MG capsule, omeprazole 40 mg capsule,delayed release, Disp: , Rfl:  .  promethazine (PHENERGAN) 25 MG tablet, Take 1 tablet (25 mg total) by mouth once for 1 dose. One hour before procedure, Disp: 5 tablet, Rfl: 0 .  tamsulosin (FLOMAX) 0.4 MG CAPS capsule, Take 1 capsule (0.4 mg total) by mouth daily. For kidney stone (Patient not taking: Reported on 08/20/2018), Disp: 30 capsule, Rfl: 0 No current facility-administered medications for this visit.   Facility-Administered Medications Ordered in Other Visits:  .  [COMPLETED] sodium chloride 0.9 % nebulizer solution 3 mL, 3 mL, Nebulization, Once, 3 mL at 02/18/17 0848 **FOLLOWED BY** [COMPLETED] methacholine (PROVOCHOLINE) inhaler solution 0.125 mg, 2 mL, Inhalation, Once, 0.125 mg at 02/18/17 0848 **FOLLOWED BY** [COMPLETED] methacholine (PROVOCHOLINE) inhaler solution 0.5 mg, 2 mL, Inhalation, Once, 0.5 mg at 02/18/17 0855 **FOLLOWED BY** [COMPLETED] methacholine (PROVOCHOLINE) inhaler solution 2 mg, 2 mL, Inhalation, Once, 2 mg at 02/18/17 0902 **FOLLOWED BY** methacholine (PROVOCHOLINE) inhaler solution 8 mg, 2 mL, Inhalation, Once **FOLLOWED BY** methacholine (PROVOCHOLINE) inhaler solution 32 mg, 2 mL, Inhalation, Once **FOLLOWED BY** albuterol (PROVENTIL) (2.5 MG/3ML) 0.083% nebulizer solution 2.5 mg, 2.5 mg, Nebulization, Once, Crecencio Mc, MD Allergies: Buprenorphine hcl; Morphine and related; Prednisone; and Tramadol hcl  Review of Systems  All other systems reviewed and are negative.   Objective: BP 120/80   Ht 5\' 8"  (1.727 m)   Wt 290 lb (131.5 kg)   BMI 44.09 kg/m   Filed Weights   08/20/18 1543  Weight: 290 lb (131.5 kg)   Physical Exam  Constitutional: She is oriented to person, place, and time. She appears well-developed and well-nourished. No distress.  Musculoskeletal: Normal range of motion.  Neurological: She is alert and oriented to person, place, and time.  Skin: Skin is warm and dry.  Psychiatric: She has a normal mood and  affect.  Vitals reviewed.  Assessment: 1. Menorrhagia with regular cycle   2. Dysmenorrhea   Patient was told that it is normal to have menstrual bleeding after an endometrial ablation, only about 60-80% of patients become amenorrheic, 10-20% of patients have normal or light periods, and 10% of patients have no change in their bleeding pattern and may need further intervention.  She was told she will observe her periods for a few months after her ablation to see what her periods will be like; it is recommended to wait until at least three months after the procedure before making conclusions about how periods are going to be like after an ablation.  Barnett Applebaum, MD, [facility], [facility] 08/20/2018  4:03 PM

## 2018-08-21 ENCOUNTER — Telehealth: Payer: Self-pay

## 2018-08-21 NOTE — Telephone Encounter (Signed)
Pt calling stating that the pharmacy told her that they are not filling her Demerol RX and shes called other pharmacies and they have told her the same thing. Please advise.  [phone]

## 2018-08-21 NOTE — Telephone Encounter (Signed)
Pt states that pharm called her back and they should have the medication instock by Monday. Pt will call back if not

## 2018-08-21 NOTE — Telephone Encounter (Signed)
Call her pharmacy and see why (out of stock, back order, problem w her, or what?)  Thx

## 2018-08-24 ENCOUNTER — Other Ambulatory Visit: Payer: Self-pay | Admitting: Obstetrics & Gynecology

## 2018-08-24 ENCOUNTER — Telehealth: Payer: Self-pay

## 2018-08-24 NOTE — Telephone Encounter (Signed)
Pharmacy told pt they can't get demerol she needs for upcoming procedure.  [phone]

## 2018-08-24 NOTE — Telephone Encounter (Signed)
Pt states [facility] and [facility] both told her this medication can not be administered as a pill form anymore. only in hospital with IV, please advise if you can rx a different medication for her procedure

## 2018-08-24 NOTE — Telephone Encounter (Signed)
Have her take 2 of the Norco in its place

## 2018-08-24 NOTE — Telephone Encounter (Signed)
Pt aware.

## 2018-08-26 ENCOUNTER — Ambulatory Visit (INDEPENDENT_AMBULATORY_CARE_PROVIDER_SITE_OTHER): Payer: Commercial Managed Care - PPO | Admitting: Obstetrics & Gynecology

## 2018-08-26 ENCOUNTER — Encounter: Payer: Self-pay | Admitting: Obstetrics & Gynecology

## 2018-08-26 VITALS — BP 138/88 | Ht 69.0 in | Wt 294.0 lb

## 2018-08-26 DIAGNOSIS — N946 Dysmenorrhea, unspecified: Secondary | ICD-10-CM

## 2018-08-26 DIAGNOSIS — N920 Excessive and frequent menstruation with regular cycle: Secondary | ICD-10-CM

## 2018-08-26 NOTE — Progress Notes (Signed)
PRE-OPERATIVE HISTORY AND PHYSICAL EXAM  HPI:  Molly Stone is a 37 y.o. [id number] No LMP recorded.; she is being admitted for surgery related to menorrhagia and dysmenorrhea, for ablation. Normal PAP and EMB.  Has FH DVT so wishes to avoid hormone treatments.  Recent CT pelvis no fibroids.  PMHx: Past Medical History:  Diagnosis Date  . Allergy   . Anemia   . Anxiety   . Asthma   . Chlamydia    h/o  . Disorder of vocal cord    spasmodic dysphonia  . Dysmenorrhea   . Dyspareunia, female   . Endometriosis   . Family history of endometriosis   . GERD (gastroesophageal reflux disease)   . Headache    migraine  . Heavy periods   . History of nephrolithiasis   . Hypertension   . Jaundice, physiologic, newborn   . Obesity (BMI 30-39.9)   . Sleep apnea   . Tobacco user   . Vaginal Pap smear, abnormal    Past Surgical History:  Procedure Laterality Date  . DILATION AND CURETTAGE OF UTERUS    . ESOPHAGOGASTRODUODENOSCOPY (EGD) WITH PROPOFOL N/A 08/07/2018   Procedure: ESOPHAGOGASTRODUODENOSCOPY (EGD) WITH PROPOFOL;  Surgeon: Jonathon Bellows, MD;  Location: [facility] ENDOSCOPY;  Service: Gastroenterology;  Laterality: N/A;  . laparoscopy    . WISDOM TOOTH EXTRACTION     Family History  Problem Relation Age of Onset  . Endometriosis Mother   . Cancer Father        benign liver CA  . Stroke Father        recurrent blood clots on the brain   . Cancer Maternal Grandmother 30       ovarian Ca, still surviving   . Osteoporosis Maternal Grandmother   . Ovarian cancer Maternal Grandmother   . Heart disease Maternal Grandfather   . Cancer Maternal Grandfather 15       colon CA. metastatic  . Diabetes Maternal Uncle   . Heart disease Paternal Grandmother   . Breast cancer Neg Hx   . Colon cancer Neg Hx   . Kidney cancer Neg Hx   . Bladder Cancer Neg Hx    Social History   Tobacco Use  . Smoking status: Former Smoker    Types: Cigarettes    Last attempt to quit: 10/2016   Years since quitting: 1.8  . Smokeless tobacco: Never Used  Substance Use Topics  . Alcohol use: Yes    Alcohol/week: 0.0 standard drinks    Comment: wine occassionally  . Drug use: No    Current Outpatient Medications:  .  albuterol (PROVENTIL HFA;VENTOLIN HFA) 108 (90 Base) MCG/ACT inhaler, Inhale 2 puffs into the lungs every 6 (six) hours as needed for wheezing or shortness of breath., Disp: 18 g, Rfl: 3 .  budesonide-formoterol (SYMBICORT) 160-4.5 MCG/ACT inhaler, Inhale 2 puffs into the lungs 2 (two) times daily., Disp: 1 Inhaler, Rfl: 5 .  busPIRone (BUSPAR) 15 MG tablet, TAKE 1 TABLET(15 MG) BY MOUTH THREE TIMES DAILY, Disp: 90 tablet, Rfl: 1 .  cetirizine (ZYRTEC) 10 MG tablet, Take 10 mg by mouth daily., Disp: , Rfl:  .  diazepam (VALIUM) 2 MG tablet, Take 2 mg by mouth every 6 (six) hours as needed for anxiety., Disp: , Rfl:  .  diclofenac (VOLTAREN) 75 MG EC tablet, Take 1 tablet (75 mg total) by mouth 2 (two) times daily., Disp: 60 tablet, Rfl: 0 .  fexofenadine-pseudoephedrine (ALLEGRA-D) 60-120 MG 12 hr tablet,  Take by mouth., Disp: , Rfl:  .  fluticasone (FLONASE) 50 MCG/ACT nasal spray, Place 1 spray into both nostrils daily. (Patient taking differently: Place 2 sprays into both nostrils daily. ), Disp: 15.8 g, Rfl: 5 .  ibuprofen (ADVIL,MOTRIN) 600 MG tablet, Take 1 tablet (600 mg total) by mouth every 6 (six) hours as needed for moderate pain., Disp: 60 tablet, Rfl: 3 .  montelukast (SINGULAIR) 10 MG tablet, TAKE 1 TABLET AT BEDTIME, Disp: 90 tablet, Rfl: 1 .  Multiple Vitamin (MULTIVITAMIN) capsule, Take by mouth., Disp: , Rfl:  .  mupirocin ointment (BACTROBAN) 2 %, Place 1 application into the nose 2 (two) times daily., Disp: 22 g, Rfl: 0 .  omeprazole (PRILOSEC) 40 MG capsule, omeprazole 40 mg capsule,delayed release, Disp: , Rfl:  .  pantoprazole (PROTONIX) 40 MG tablet, Take 1 tablet (40 mg total) by mouth 2 (two) times daily., Disp: 60 tablet, Rfl: 2 .  tamsulosin  (FLOMAX) 0.4 MG CAPS capsule, Take 1 capsule (0.4 mg total) by mouth daily. For kidney stone, Disp: 30 capsule, Rfl: 0 .  hydrochlorothiazide (MICROZIDE) 12.5 MG capsule, Take 1 capsule (12.5 mg total) by mouth daily., Disp: 30 capsule, Rfl: 0 .  HYDROcodone-acetaminophen (NORCO) 5-325 MG tablet, Take 1 tablet by mouth every 6 (six) hours as needed for moderate pain., Disp: 10 tablet, Rfl: 0 .  misoprostol (CYTOTEC) 200 MCG tablet, Take 1 tablet (200 mcg total) by mouth once for 1 dose. Night before procedure, Disp: 1 tablet, Rfl: 0 .  promethazine (PHENERGAN) 25 MG tablet, Take 1 tablet (25 mg total) by mouth once for 1 dose. One hour before procedure, Disp: 5 tablet, Rfl: 0 No current facility-administered medications for this visit.   Facility-Administered Medications Ordered in Other Visits:  .  [COMPLETED] sodium chloride 0.9 % nebulizer solution 3 mL, 3 mL, Nebulization, Once, 3 mL at 02/18/17 0848 **FOLLOWED BY** [COMPLETED] methacholine (PROVOCHOLINE) inhaler solution 0.125 mg, 2 mL, Inhalation, Once, 0.125 mg at 02/18/17 0848 **FOLLOWED BY** [COMPLETED] methacholine (PROVOCHOLINE) inhaler solution 0.5 mg, 2 mL, Inhalation, Once, 0.5 mg at 02/18/17 0855 **FOLLOWED BY** [COMPLETED] methacholine (PROVOCHOLINE) inhaler solution 2 mg, 2 mL, Inhalation, Once, 2 mg at 02/18/17 0902 **FOLLOWED BY** methacholine (PROVOCHOLINE) inhaler solution 8 mg, 2 mL, Inhalation, Once **FOLLOWED BY** methacholine (PROVOCHOLINE) inhaler solution 32 mg, 2 mL, Inhalation, Once **FOLLOWED BY** albuterol (PROVENTIL) (2.5 MG/3ML) 0.083% nebulizer solution 2.5 mg, 2.5 mg, Nebulization, Once, Crecencio Mc, MD Allergies: Buprenorphine hcl; Morphine and related; Prednisone; and Tramadol hcl  Review of Systems  Constitutional: Negative for chills, fever and malaise/fatigue.  HENT: Negative for congestion, sinus pain and sore throat.   Eyes: Negative for blurred vision and pain.  Respiratory: Negative for cough and  wheezing.   Cardiovascular: Negative for chest pain and leg swelling.  Gastrointestinal: Negative for abdominal pain, constipation, diarrhea, heartburn, nausea and vomiting.  Genitourinary: Negative for dysuria, frequency, hematuria and urgency.  Musculoskeletal: Negative for back pain, joint pain, myalgias and neck pain.  Skin: Negative for itching and rash.  Neurological: Negative for dizziness, tremors and weakness.  Endo/Heme/Allergies: Does not bruise/bleed easily.  Psychiatric/Behavioral: Negative for depression. The patient is not nervous/anxious and does not have insomnia.     Objective: BP 138/88   Ht 5\' 9"  (1.753 m)   Wt 294 lb (133.4 kg)   BMI 43.42 kg/m   Filed Weights   08/26/18 0939  Weight: 294 lb (133.4 kg)   Physical Exam  Constitutional: She is oriented to person, place,  and time. She appears well-developed and well-nourished. No distress.  HENT:  Head: Normocephalic and atraumatic. Head is without laceration.  Right Ear: Hearing normal.  Left Ear: Hearing normal.  Nose: No epistaxis.  No foreign bodies.  Mouth/Throat: Uvula is midline, oropharynx is clear and moist and mucous membranes are normal.  Eyes: Pupils are equal, round, and reactive to light.  Neck: Normal range of motion. Neck supple. No thyromegaly present.  Cardiovascular: Normal rate and regular rhythm. Exam reveals no gallop and no friction rub.  No murmur heard. Pulmonary/Chest: Effort normal and breath sounds normal. No respiratory distress. She has no wheezes. Right breast exhibits no mass, no skin change and no tenderness. Left breast exhibits no mass, no skin change and no tenderness.  Abdominal: Soft. Bowel sounds are normal. She exhibits no distension. There is no tenderness. There is no rebound.  Musculoskeletal: Normal range of motion.  Neurological: She is alert and oriented to person, place, and time. No cranial nerve deficit.  Skin: Skin is warm and dry.  Psychiatric: She has a normal  mood and affect. Judgment normal.  Vitals reviewed.   Assessment: 1. Menorrhagia with regular cycle   2. Dysmenorrhea   For ablation procedure, after unable to do in office due to equipment failure there  I have had a careful discussion with this patient about all the options available and the risk/benefits of each. I have fully informed this patient that surgery may subject her to a variety of discomforts and risks: She understands that most patients have surgery with little difficulty, but problems can happen ranging from minor to fatal. These include nausea, vomiting, pain, bleeding, infection, poor healing, hernia, or formation of adhesions. Unexpected reactions may occur from any drug or anesthetic given. Unintended injury may occur to other pelvic or abdominal structures such as Fallopian tubes, ovaries, bladder, ureter (tube from kidney to bladder), or bowel. Nerves going from the pelvis to the legs may be injured. Any such injury may require immediate or later additional surgery to correct the problem. Excessive blood loss requiring transfusion is very unlikely but possible. Dangerous blood clots may form in the legs or lungs. Physical and sexual activity will be restricted in varying degrees for an indeterminate period of time but most often 2-6 weeks.  Finally, she understands that it is impossible to list every possible undesirable effect and that the condition for which surgery is done is not always cured or significantly improved, and in rare cases may be even worse.Ample time was given to answer all questions.  Barnett Applebaum, MD, [facility], [facility] 08/26/2018  11:13 AM

## 2018-08-26 NOTE — H&P (View-Only) (Signed)
PRE-OPERATIVE HISTORY AND PHYSICAL EXAM  HPI:  Molly Stone is a 37 y.o. [id number] No LMP recorded.; she is being admitted for surgery related to menorrhagia and dysmenorrhea, for ablation. Normal PAP and EMB.  Has FH DVT so wishes to avoid hormone treatments.  Recent CT pelvis no fibroids.  PMHx: Past Medical History:  Diagnosis Date  . Allergy   . Anemia   . Anxiety   . Asthma   . Chlamydia    h/o  . Disorder of vocal cord    spasmodic dysphonia  . Dysmenorrhea   . Dyspareunia, female   . Endometriosis   . Family history of endometriosis   . GERD (gastroesophageal reflux disease)   . Headache    migraine  . Heavy periods   . History of nephrolithiasis   . Hypertension   . Jaundice, physiologic, newborn   . Obesity (BMI 30-39.9)   . Sleep apnea   . Tobacco user   . Vaginal Pap smear, abnormal    Past Surgical History:  Procedure Laterality Date  . DILATION AND CURETTAGE OF UTERUS    . ESOPHAGOGASTRODUODENOSCOPY (EGD) WITH PROPOFOL N/A 08/07/2018   Procedure: ESOPHAGOGASTRODUODENOSCOPY (EGD) WITH PROPOFOL;  Surgeon: Jonathon Bellows, MD;  Location: [facility] ENDOSCOPY;  Service: Gastroenterology;  Laterality: N/A;  . laparoscopy    . WISDOM TOOTH EXTRACTION     Family History  Problem Relation Age of Onset  . Endometriosis Mother   . Cancer Father        benign liver CA  . Stroke Father        recurrent blood clots on the brain   . Cancer Maternal Grandmother 30       ovarian Ca, still surviving   . Osteoporosis Maternal Grandmother   . Ovarian cancer Maternal Grandmother   . Heart disease Maternal Grandfather   . Cancer Maternal Grandfather 75       colon CA. metastatic  . Diabetes Maternal Uncle   . Heart disease Paternal Grandmother   . Breast cancer Neg Hx   . Colon cancer Neg Hx   . Kidney cancer Neg Hx   . Bladder Cancer Neg Hx    Social History   Tobacco Use  . Smoking status: Former Smoker    Types: Cigarettes    Last attempt to quit: 10/2016   Years since quitting: 1.8  . Smokeless tobacco: Never Used  Substance Use Topics  . Alcohol use: Yes    Alcohol/week: 0.0 standard drinks    Comment: wine occassionally  . Drug use: No    Current Outpatient Medications:  .  albuterol (PROVENTIL HFA;VENTOLIN HFA) 108 (90 Base) MCG/ACT inhaler, Inhale 2 puffs into the lungs every 6 (six) hours as needed for wheezing or shortness of breath., Disp: 18 g, Rfl: 3 .  budesonide-formoterol (SYMBICORT) 160-4.5 MCG/ACT inhaler, Inhale 2 puffs into the lungs 2 (two) times daily., Disp: 1 Inhaler, Rfl: 5 .  busPIRone (BUSPAR) 15 MG tablet, TAKE 1 TABLET(15 MG) BY MOUTH THREE TIMES DAILY, Disp: 90 tablet, Rfl: 1 .  cetirizine (ZYRTEC) 10 MG tablet, Take 10 mg by mouth daily., Disp: , Rfl:  .  diazepam (VALIUM) 2 MG tablet, Take 2 mg by mouth every 6 (six) hours as needed for anxiety., Disp: , Rfl:  .  diclofenac (VOLTAREN) 75 MG EC tablet, Take 1 tablet (75 mg total) by mouth 2 (two) times daily., Disp: 60 tablet, Rfl: 0 .  fexofenadine-pseudoephedrine (ALLEGRA-D) 60-120 MG 12 hr tablet,  Take by mouth., Disp: , Rfl:  .  fluticasone (FLONASE) 50 MCG/ACT nasal spray, Place 1 spray into both nostrils daily. (Patient taking differently: Place 2 sprays into both nostrils daily. ), Disp: 15.8 g, Rfl: 5 .  ibuprofen (ADVIL,MOTRIN) 600 MG tablet, Take 1 tablet (600 mg total) by mouth every 6 (six) hours as needed for moderate pain., Disp: 60 tablet, Rfl: 3 .  montelukast (SINGULAIR) 10 MG tablet, TAKE 1 TABLET AT BEDTIME, Disp: 90 tablet, Rfl: 1 .  Multiple Vitamin (MULTIVITAMIN) capsule, Take by mouth., Disp: , Rfl:  .  mupirocin ointment (BACTROBAN) 2 %, Place 1 application into the nose 2 (two) times daily., Disp: 22 g, Rfl: 0 .  omeprazole (PRILOSEC) 40 MG capsule, omeprazole 40 mg capsule,delayed release, Disp: , Rfl:  .  pantoprazole (PROTONIX) 40 MG tablet, Take 1 tablet (40 mg total) by mouth 2 (two) times daily., Disp: 60 tablet, Rfl: 2 .  tamsulosin  (FLOMAX) 0.4 MG CAPS capsule, Take 1 capsule (0.4 mg total) by mouth daily. For kidney stone, Disp: 30 capsule, Rfl: 0 .  hydrochlorothiazide (MICROZIDE) 12.5 MG capsule, Take 1 capsule (12.5 mg total) by mouth daily., Disp: 30 capsule, Rfl: 0 .  HYDROcodone-acetaminophen (NORCO) 5-325 MG tablet, Take 1 tablet by mouth every 6 (six) hours as needed for moderate pain., Disp: 10 tablet, Rfl: 0 .  misoprostol (CYTOTEC) 200 MCG tablet, Take 1 tablet (200 mcg total) by mouth once for 1 dose. Night before procedure, Disp: 1 tablet, Rfl: 0 .  promethazine (PHENERGAN) 25 MG tablet, Take 1 tablet (25 mg total) by mouth once for 1 dose. One hour before procedure, Disp: 5 tablet, Rfl: 0 No current facility-administered medications for this visit.   Facility-Administered Medications Ordered in Other Visits:  .  [COMPLETED] sodium chloride 0.9 % nebulizer solution 3 mL, 3 mL, Nebulization, Once, 3 mL at 02/18/17 0848 **FOLLOWED BY** [COMPLETED] methacholine (PROVOCHOLINE) inhaler solution 0.125 mg, 2 mL, Inhalation, Once, 0.125 mg at 02/18/17 0848 **FOLLOWED BY** [COMPLETED] methacholine (PROVOCHOLINE) inhaler solution 0.5 mg, 2 mL, Inhalation, Once, 0.5 mg at 02/18/17 0855 **FOLLOWED BY** [COMPLETED] methacholine (PROVOCHOLINE) inhaler solution 2 mg, 2 mL, Inhalation, Once, 2 mg at 02/18/17 0902 **FOLLOWED BY** methacholine (PROVOCHOLINE) inhaler solution 8 mg, 2 mL, Inhalation, Once **FOLLOWED BY** methacholine (PROVOCHOLINE) inhaler solution 32 mg, 2 mL, Inhalation, Once **FOLLOWED BY** albuterol (PROVENTIL) (2.5 MG/3ML) 0.083% nebulizer solution 2.5 mg, 2.5 mg, Nebulization, Once, Crecencio Mc, MD Allergies: Buprenorphine hcl; Morphine and related; Prednisone; and Tramadol hcl  Review of Systems  Constitutional: Negative for chills, fever and malaise/fatigue.  HENT: Negative for congestion, sinus pain and sore throat.   Eyes: Negative for blurred vision and pain.  Respiratory: Negative for cough and  wheezing.   Cardiovascular: Negative for chest pain and leg swelling.  Gastrointestinal: Negative for abdominal pain, constipation, diarrhea, heartburn, nausea and vomiting.  Genitourinary: Negative for dysuria, frequency, hematuria and urgency.  Musculoskeletal: Negative for back pain, joint pain, myalgias and neck pain.  Skin: Negative for itching and rash.  Neurological: Negative for dizziness, tremors and weakness.  Endo/Heme/Allergies: Does not bruise/bleed easily.  Psychiatric/Behavioral: Negative for depression. The patient is not nervous/anxious and does not have insomnia.     Objective: BP 138/88   Ht 5\' 9"  (1.753 m)   Wt 294 lb (133.4 kg)   BMI 43.42 kg/m   Filed Weights   08/26/18 0939  Weight: 294 lb (133.4 kg)   Physical Exam  Constitutional: She is oriented to person, place,  and time. She appears well-developed and well-nourished. No distress.  HENT:  Head: Normocephalic and atraumatic. Head is without laceration.  Right Ear: Hearing normal.  Left Ear: Hearing normal.  Nose: No epistaxis.  No foreign bodies.  Mouth/Throat: Uvula is midline, oropharynx is clear and moist and mucous membranes are normal.  Eyes: Pupils are equal, round, and reactive to light.  Neck: Normal range of motion. Neck supple. No thyromegaly present.  Cardiovascular: Normal rate and regular rhythm. Exam reveals no gallop and no friction rub.  No murmur heard. Pulmonary/Chest: Effort normal and breath sounds normal. No respiratory distress. She has no wheezes. Right breast exhibits no mass, no skin change and no tenderness. Left breast exhibits no mass, no skin change and no tenderness.  Abdominal: Soft. Bowel sounds are normal. She exhibits no distension. There is no tenderness. There is no rebound.  Musculoskeletal: Normal range of motion.  Neurological: She is alert and oriented to person, place, and time. No cranial nerve deficit.  Skin: Skin is warm and dry.  Psychiatric: She has a normal  mood and affect. Judgment normal.  Vitals reviewed.   Assessment: 1. Menorrhagia with regular cycle   2. Dysmenorrhea   For ablation procedure, after unable to do in office due to equipment failure there  I have had a careful discussion with this patient about all the options available and the risk/benefits of each. I have fully informed this patient that surgery may subject her to a variety of discomforts and risks: She understands that most patients have surgery with little difficulty, but problems can happen ranging from minor to fatal. These include nausea, vomiting, pain, bleeding, infection, poor healing, hernia, or formation of adhesions. Unexpected reactions may occur from any drug or anesthetic given. Unintended injury may occur to other pelvic or abdominal structures such as Fallopian tubes, ovaries, bladder, ureter (tube from kidney to bladder), or bowel. Nerves going from the pelvis to the legs may be injured. Any such injury may require immediate or later additional surgery to correct the problem. Excessive blood loss requiring transfusion is very unlikely but possible. Dangerous blood clots may form in the legs or lungs. Physical and sexual activity will be restricted in varying degrees for an indeterminate period of time but most often 2-6 weeks.  Finally, she understands that it is impossible to list every possible undesirable effect and that the condition for which surgery is done is not always cured or significantly improved, and in rare cases may be even worse.Ample time was given to answer all questions.  Barnett Applebaum, MD, [facility], [facility] 08/26/2018  11:13 AM

## 2018-08-26 NOTE — Patient Instructions (Signed)
    Endometrial Ablation Post-Procedural Instructions for Patient   You may experience mild to moderate cramping (like menstrual cramping) and pinkish watery discharge.  This may last approximately 2 to 3 weeks. Use pads, not tampons during this time.  No sexual activity for 3 weeks post procedure.  Follow up medications and directions for taking the medications are    NORCO 5/500 1 or 2 by mouth every 4 to 6 hours as needed IBUPROFEN 800 mg by mouth three times a day for the next two days PHENERGAN 25 mg by mouth q 6 hours for nausea       Call our office if you develop any of the following: ? Fever of 100.4 or greater  ? Worsening pelvic pain  ? Nausea  ? Vomiting  ? Greenish vaginal discharge with odor   Office phone number: [phone]   

## 2018-08-26 NOTE — Progress Notes (Signed)
Procedure/ Operative Note   08/26/2018  PRE-OP DIAGNOSIS: Menorrhagia, Dysmenorrhea   POST-OP DIAGNOSIS: same   SURGEON: Barnett Applebaum, MD, FACOG   PROCEDURE: Hysteroscopy done.  Endometrial ablation attempted but abandoned due to equipment failure  ANESTHESIA: Local  ESTIMATED BLOOD LOSS: min    FLUID DEFICIT: min   COMPLICATIONS: none   DISPOSITION: PACU - hemodynamically stable.   CONDITION: stable   FINDINGS: Exam under anesthesia revealed small, mobile small uterus with no masses and bilateral adnexa without masses or fullness. Hysteroscopy revealed otherwise grossly normal appearing uterine cavity with bilateral tubal ostia and normal appearing endocervical canal.   PROCEDURE IN DETAIL: After informed consent was obtained, the patient was taken to the operating room where anesthesia was obtained without difficulty. The patient was positioned in the dorsal lithotomy position in [place]. The patient's bladder was catheterized with an in and out foley catheter. The patient was examined under anesthesia, with the above noted findings. The weightedspeculum was placed inside the patient's vagina, and the the anterior lip of the cervix was seen and grasped with the tenaculum.  The uterine cavity was sounded to 8cm, and then the cervix was progressively dilated to a 16 French-Pratt dilator. The  hysteroscope was introduced, with LR fluid used to distend the intrauterine cavity, with the above noted findings.  The hystersocope was removed. The Minerva endometrial ablation device was then placed without difficulty but unable to be used due to base malfunction.  No ablation done.    Tenaculum was removed with excellent hemostasis noted. She was then taken out of dorsal lithotomy. Minimal discrepancy in fluid was noted. No bleeding.   The patient tolerated the procedure well.   Counseled will plan to do ablation in main OR where more equipment available.  Plan tomorrow.  Barnett Applebaum,  MD, [facility], [facility] 08/26/2018  10:17 AM

## 2018-08-27 ENCOUNTER — Ambulatory Visit: Payer: Commercial Managed Care - PPO | Admitting: Anesthesiology

## 2018-08-27 ENCOUNTER — Ambulatory Visit
Admission: RE | Admit: 2018-08-27 | Discharge: 2018-08-27 | Disposition: A | Discharge status: Home / Self Care | Payer: Commercial Managed Care - PPO | Source: Ambulatory Visit | Attending: Obstetrics & Gynecology | Admitting: Obstetrics & Gynecology

## 2018-08-27 ENCOUNTER — Encounter
Admission: RE | Disposition: A | Discharge status: Home / Self Care | Payer: Self-pay | Source: Ambulatory Visit | Attending: Obstetrics & Gynecology

## 2018-08-27 ENCOUNTER — Other Ambulatory Visit: Payer: Self-pay

## 2018-08-27 DIAGNOSIS — Z87442 Personal history of urinary calculi: Secondary | ICD-10-CM | POA: Insufficient documentation

## 2018-08-27 DIAGNOSIS — Z79899 Other long term (current) drug therapy: Secondary | ICD-10-CM | POA: Insufficient documentation

## 2018-08-27 DIAGNOSIS — Z888 Allergy status to other drugs, medicaments and biological substances status: Secondary | ICD-10-CM | POA: Insufficient documentation

## 2018-08-27 DIAGNOSIS — Z87891 Personal history of nicotine dependence: Secondary | ICD-10-CM | POA: Diagnosis not present

## 2018-08-27 DIAGNOSIS — K219 Gastro-esophageal reflux disease without esophagitis: Secondary | ICD-10-CM | POA: Diagnosis not present

## 2018-08-27 DIAGNOSIS — I10 Essential (primary) hypertension: Secondary | ICD-10-CM | POA: Insufficient documentation

## 2018-08-27 DIAGNOSIS — N920 Excessive and frequent menstruation with regular cycle: Secondary | ICD-10-CM | POA: Insufficient documentation

## 2018-08-27 DIAGNOSIS — G4730 Sleep apnea, unspecified: Secondary | ICD-10-CM | POA: Diagnosis not present

## 2018-08-27 DIAGNOSIS — Z885 Allergy status to narcotic agent status: Secondary | ICD-10-CM | POA: Diagnosis not present

## 2018-08-27 DIAGNOSIS — N946 Dysmenorrhea, unspecified: Secondary | ICD-10-CM | POA: Diagnosis not present

## 2018-08-27 DIAGNOSIS — G43909 Migraine, unspecified, not intractable, without status migrainosus: Secondary | ICD-10-CM | POA: Diagnosis not present

## 2018-08-27 DIAGNOSIS — F419 Anxiety disorder, unspecified: Secondary | ICD-10-CM | POA: Diagnosis not present

## 2018-08-27 DIAGNOSIS — J45909 Unspecified asthma, uncomplicated: Secondary | ICD-10-CM | POA: Insufficient documentation

## 2018-08-27 HISTORY — PX: DILATATION & CURETTAGE/HYSTEROSCOPY WITH MYOSURE: SHX6511

## 2018-08-27 LAB — TYPE AND SCREEN
ABO/RH(D): A POS
Antibody Screen: NEGATIVE

## 2018-08-27 LAB — CBC
HEMATOCRIT: 40.9 % (ref 36.0–46.0)
Hemoglobin: 13.5 g/dL (ref 12.0–15.0)
MCH: 29.7 pg (ref 26.0–34.0)
MCHC: 33 g/dL (ref 30.0–36.0)
MCV: 89.9 fL (ref 80.0–100.0)
Platelets: 175 10*3/uL (ref 150–400)
RBC: 4.55 MIL/uL (ref 3.87–5.11)
RDW: 12.2 % (ref 11.5–15.5)
WBC: 3.7 10*3/uL — AB (ref 4.0–10.5)
nRBC: 0 % (ref 0.0–0.2)

## 2018-08-27 LAB — POCT PREGNANCY, URINE: Preg Test, Ur: NEGATIVE

## 2018-08-27 SURGERY — DILATATION & CURETTAGE/HYSTEROSCOPY WITH MYOSURE
Anesthesia: General

## 2018-08-27 MED ORDER — LACTATED RINGERS IV SOLN
INTRAVENOUS | Status: DC
  Administered 2018-08-27: 14:00:00 via INTRAVENOUS

## 2018-08-27 MED ORDER — ACETAMINOPHEN 325 MG PO TABS: 650.0000 mg | ORAL_TABLET | ORAL | Status: DC | PRN

## 2018-08-27 MED ORDER — OXYCODONE-ACETAMINOPHEN 5-325 MG PO TABS
ORAL_TABLET | ORAL | Status: AC
  Filled 2018-08-27: qty 1

## 2018-08-27 MED ORDER — ACETAMINOPHEN 650 MG RE SUPP: 650.0000 mg | RECTAL | Status: DC | PRN

## 2018-08-27 MED ORDER — FENTANYL CITRATE (PF) 100 MCG/2ML IJ SOLN
INTRAMUSCULAR | Status: AC
  Filled 2018-08-27: qty 2

## 2018-08-27 MED ORDER — MIDAZOLAM HCL 2 MG/2ML IJ SOLN
INTRAMUSCULAR | Status: DC | PRN
  Administered 2018-08-27: 2 mg via INTRAVENOUS

## 2018-08-27 MED ORDER — KETOROLAC TROMETHAMINE 30 MG/ML IJ SOLN
INTRAMUSCULAR | Status: DC | PRN
  Administered 2018-08-27: 30 mg via INTRAVENOUS

## 2018-08-27 MED ORDER — PROPOFOL 10 MG/ML IV BOLUS
INTRAVENOUS | Status: AC
  Filled 2018-08-27: qty 20

## 2018-08-27 MED ORDER — ONDANSETRON HCL 4 MG/2ML IJ SOLN: 4.0000 mg | Freq: Once | INTRAMUSCULAR | Status: DC | PRN

## 2018-08-27 MED ORDER — FENTANYL CITRATE (PF) 100 MCG/2ML IJ SOLN
INTRAMUSCULAR | Status: DC | PRN
  Administered 2018-08-27 (×2): 50 ug via INTRAVENOUS

## 2018-08-27 MED ORDER — MORPHINE SULFATE (PF) 4 MG/ML IV SOLN: 1.0000 mg | INTRAVENOUS | Status: DC | PRN

## 2018-08-27 MED ORDER — PROPOFOL 10 MG/ML IV BOLUS
INTRAVENOUS | Status: DC | PRN
  Administered 2018-08-27: 200 mg via INTRAVENOUS

## 2018-08-27 MED ORDER — OXYCODONE-ACETAMINOPHEN 5-325 MG PO TABS
1.0000 | ORAL_TABLET | ORAL | Status: DC | PRN
  Administered 2018-08-27: 1 via ORAL

## 2018-08-27 MED ORDER — MIDAZOLAM HCL 2 MG/2ML IJ SOLN
INTRAMUSCULAR | Status: AC
  Filled 2018-08-27: qty 2

## 2018-08-27 MED ORDER — FENTANYL CITRATE (PF) 100 MCG/2ML IJ SOLN
INTRAMUSCULAR | Status: AC
  Administered 2018-08-27: 25 ug via INTRAVENOUS
  Filled 2018-08-27: qty 2

## 2018-08-27 MED ORDER — FENTANYL CITRATE (PF) 100 MCG/2ML IJ SOLN
25.0000 ug | INTRAMUSCULAR | Status: AC | PRN
  Administered 2018-08-27 (×6): 25 ug via INTRAVENOUS

## 2018-08-27 MED ORDER — ONDANSETRON HCL 4 MG/2ML IJ SOLN
INTRAMUSCULAR | Status: DC | PRN
  Administered 2018-08-27: 4 mg via INTRAVENOUS

## 2018-08-27 MED ORDER — LIDOCAINE HCL (CARDIAC) PF 100 MG/5ML IV SOSY
PREFILLED_SYRINGE | INTRAVENOUS | Status: DC | PRN
  Administered 2018-08-27: 100 mg via INTRAVENOUS

## 2018-08-27 MED ORDER — LACTATED RINGERS IV SOLN: INTRAVENOUS | Status: DC

## 2018-08-27 MED ORDER — KETOROLAC TROMETHAMINE 30 MG/ML IJ SOLN: 30.0000 mg | Freq: Four times a day (QID) | INTRAMUSCULAR | Status: DC

## 2018-08-27 SURGICAL SUPPLY — 23 items
BAG COUNTER SPONGE EZ (MISCELLANEOUS) ×2 IMPLANT
CANISTER SUC SOCK COL 7IN (MISCELLANEOUS) ×2 IMPLANT
CATH ROBINSON RED A/P 16FR (CATHETERS) ×2 IMPLANT
COVER WAND RF STERILE (DRAPES) ×2 IMPLANT
DEVICE MYOSURE LITE (MISCELLANEOUS) IMPLANT
DEVICE MYOSURE REACH (MISCELLANEOUS) IMPLANT
ELECT REM PT RETURN 9FT ADLT (ELECTROSURGICAL) ×2
ELECTRODE REM PT RTRN 9FT ADLT (ELECTROSURGICAL) ×1 IMPLANT
GLOVE BIO SURGEON STRL SZ8 (GLOVE) ×2 IMPLANT
GOWN STRL REUS W/ TWL LRG LVL3 (GOWN DISPOSABLE) ×1 IMPLANT
GOWN STRL REUS W/ TWL XL LVL3 (GOWN DISPOSABLE) ×1 IMPLANT
GOWN STRL REUS W/TWL LRG LVL3 (GOWN DISPOSABLE) ×1
GOWN STRL REUS W/TWL XL LVL3 (GOWN DISPOSABLE) ×1
HANDPIECE ABLA MINERVA ENDO (MISCELLANEOUS) ×2 IMPLANT
PACK DNC HYST (MISCELLANEOUS) ×2 IMPLANT
PAD OB MATERNITY 4.3X12.25 (PERSONAL CARE ITEMS) ×2 IMPLANT
PAD PREP 24X41 OB/GYN DISP (PERSONAL CARE ITEMS) ×2 IMPLANT
SOL .9 NS 3000ML IRR  AL (IV SOLUTION) ×1
SOL .9 NS 3000ML IRR UROMATIC (IV SOLUTION) ×1 IMPLANT
STRAP SAFETY 5IN WIDE (MISCELLANEOUS) ×2 IMPLANT
TOWEL OR 17X26 4PK STRL BLUE (TOWEL DISPOSABLE) ×2 IMPLANT
TUBING CONNECTING 10 (TUBING) ×2 IMPLANT
TUBING HYSTEROSCOPY DOLPHIN (MISCELLANEOUS) IMPLANT

## 2018-08-27 NOTE — Discharge Instructions (Signed)
AMBULATORY SURGERY  °DISCHARGE INSTRUCTIONS ° ° °1) The drugs that you were given will stay in your system until tomorrow so for the next 24 hours you should not: ° °A) Drive an automobile °B) Make any legal decisions °C) Drink any alcoholic beverage ° ° °2) You may resume regular meals tomorrow.  Today it is better to start with liquids and gradually work up to solid foods. ° °You may eat anything you prefer, but it is better to start with liquids, then soup and crackers, and gradually work up to solid foods. ° ° °3) Please notify your doctor immediately if you have any unusual bleeding, trouble breathing, redness and pain at the surgery site, drainage, fever, or pain not relieved by medication. °4)  ° °5) Your post-operative visit with Dr.                     °           °     is: Date:                        Time:   ° °Please call to schedule your post-operative visit. ° °6) Additional Instructions: ° ° ° ° ° °Hysteroscopy, Care After °Refer to this sheet in the next few weeks. These instructions provide you with information on caring for yourself after your procedure. Your health care provider may also give you more specific instructions. Your treatment has been planned according to current medical practices, but problems sometimes occur. Call your health care provider if you have any problems or questions after your procedure. °What can I expect after the procedure? °After your procedure, it is typical to have the following: °· You may have some cramping. This normally lasts for a couple days. °· You may have bleeding. This can vary from light spotting for a few days to menstrual-like bleeding for 3-7 days. ° °Follow these instructions at home: °· Rest for the first 1-2 days after the procedure. °· Only take over-the-counter or prescription medicines as directed by your health care provider. Do not take aspirin. It can increase the chances of bleeding. °· Take showers instead of baths for 2 weeks or as directed  by your health care provider. °· Do not drive for 24 hours or as directed. °· Do not drink alcohol while taking pain medicine. °· Do not use tampons, douche, or have sexual intercourse for 2 weeks or until your health care provider says it is okay. °· Take your temperature twice a day for 4-5 days. Write it down each time. °· Follow your health care provider's advice about diet, exercise, and lifting. °· If you develop constipation, you may: °? Take a mild laxative if your health care provider approves. °? Add bran foods to your diet. °? Drink enough fluids to keep your urine clear or pale yellow. °· Try to have someone with you or available to you for the first 24-48 hours, especially if you were given a general anesthetic. °· Follow up with your health care provider as directed. °Contact a health care provider if: °· You feel dizzy or lightheaded. °· You feel sick to your stomach (nauseous). °· You have abnormal vaginal discharge. °· You have a rash. °· You have pain that is not controlled with medicine. °Get help right away if: °· You have bleeding that is heavier than a normal menstrual period. °· You have a fever. °· You have increasing cramps   or pain, not controlled with medicine. °· You have new belly (abdominal) pain. °· You pass out. °· You have pain in the tops of your shoulders (shoulder strap areas). °· You have shortness of breath. °This information is not intended to replace advice given to you by your health care provider. Make sure you discuss any questions you have with your health care provider. °Document Released: 07/28/2013 Document Revised: 03/14/2016 Document Reviewed: 05/06/2013 °Elsevier Interactive Patient Education © 2017 [employer] ° °

## 2018-08-27 NOTE — Anesthesia Procedure Notes (Signed)
Procedure Name: LMA Insertion Date/Time: 08/27/2018 2:46 PM Performed by: Philbert Riser, CRNA Pre-anesthesia Checklist: Patient identified, Emergency Drugs available, Suction available, Patient being monitored and Timeout performed Patient Re-evaluated:Patient Re-evaluated prior to induction Oxygen Delivery Method: Circle system utilized and Simple face mask Preoxygenation: Pre-oxygenation with 100% oxygen Induction Type: IV induction Ventilation: Mask ventilation without difficulty LMA Size: 4.0 Number of attempts: 1 Placement Confirmation: ETT inserted through vocal cords under direct vision,  breath sounds checked- equal and bilateral and positive ETCO2

## 2018-08-27 NOTE — Op Note (Signed)
Operative Note   08/27/2018  PRE-OP DIAGNOSIS: Menorrhagia   POST-OP DIAGNOSIS: same   SURGEON: Barnett Applebaum, MD, FACOG   PROCEDURE: Procedure(s): DILATATION & CURETTAGE/HYSTEROSCOPY WITH MINERVA  ANESTHESIA: General   ESTIMATED BLOOD LOSS: min   SPECIMENS: none  FLUID DEFICIT: min   COMPLICATIONS: none   DISPOSITION: PACU - hemodynamically stable.   CONDITION: stable   FINDINGS: Exam under anesthesia revealed small, mobile small uterus with no masses and bilateral adnexa without masses or fullness. Hysteroscopy revealed , otherwise grossly normal appearing uterine cavity with bilateral tubal ostia and normal appearing endocervical canal.   PROCEDURE IN DETAIL: After informed consent was obtained, the patient was taken to the operating room where anesthesia was obtained without difficulty. The patient was positioned in the dorsal lithotomy position in [place]. The patient's bladder was catheterized with an in and out foley catheter. The patient was examined under anesthesia, with the above noted findings. The weightedspeculum was placed inside the patient's vagina, and the the anterior lip of the cervix was seen and grasped with the tenaculum. An Endocervical specimen was obtained with a kevorkian curette. The uterine cavity was sounded to 11cm, and then the cervix was progressively dilated to a 16 French-Pratt dilator. The 30 degree hysteroscope was introduced, with LR fluid used to distend the intrauterine cavity, with the above noted findings.   The hystersocope was removed and the uterine cavity was curetted until a gritty texture was noted, yielding endometrial curettings. Repeat hysteroscopy performed, with improved contour and lining of uterus noted.  Excellent hemostasis was noted.   The Minerva endometrial ablation device was then placed without difficulty. Measurements were obtained. Patient was noted to have a uterine length of 11 cm, a cervical length of 4.5 cm, and a  cervical width of >2,5 cm. The device is first tested and after confirmation the procedures performed. Length of procedure was 120 seconds. The ablation device is then removed and repeat hysteroscopy reveals an appropriate lining of the uterus and no perforation or injury. Hysteroscope is removed with minimal discrepancy of fluid.   Tenaculum was removed with excellent hemostasis noted. She was then taken out of dorsal lithotomy. Minimal discrepancy in fluid was noted. No bleeding.   The patient tolerated the procedure well. Sponge, lap and needle counts were correct x2. The patient was taken to recovery room in excellent condition.  Barnett Applebaum, MD, [facility], [facility] 08/27/2018  3:26 PM

## 2018-08-27 NOTE — Anesthesia Preprocedure Evaluation (Signed)
Anesthesia Evaluation  Patient identified by MRN, date of birth, ID band Patient awake    Reviewed: Allergy & Precautions, H&P , NPO status , Patient's Chart, lab work & pertinent test results, reviewed documented beta blocker date and time   Airway Mallampati: III  TM Distance: >3 FB Neck ROM: full    Dental  (+) Teeth Intact   Pulmonary shortness of breath and with exertion, asthma , sleep apnea , former smoker,    Pulmonary exam normal        Cardiovascular Exercise Tolerance: Poor hypertension, On Medications negative cardio ROS Normal cardiovascular exam Rate:Normal     Neuro/Psych  Headaches, PSYCHIATRIC DISORDERS Anxiety  Neuromuscular disease    GI/Hepatic Neg liver ROS, GERD  ,  Endo/Other  Morbid obesity  Renal/GU negative Renal ROS  negative genitourinary   Musculoskeletal   Abdominal   Peds  Hematology  (+) Blood dyscrasia, anemia ,   Anesthesia Other Findings   Reproductive/Obstetrics negative OB ROS                             Anesthesia Physical Anesthesia Plan  ASA: III  Anesthesia Plan: General LMA   Post-op Pain Management:    Induction:   PONV Risk Score and Plan:   Airway Management Planned:   Additional Equipment:   Intra-op Plan:   Post-operative Plan:   Informed Consent: I have reviewed the patients History and Physical, chart, labs and discussed the procedure including the risks, benefits and alternatives for the proposed anesthesia with the patient or authorized representative who has indicated his/her understanding and acceptance.     Plan Discussed with: CRNA  Anesthesia Plan Comments:         Anesthesia Quick Evaluation

## 2018-08-27 NOTE — Interval H&P Note (Signed)
History and Physical Interval Note:  08/27/2018 12:59 PM  Molly Stone  has presented today for surgery, with the diagnosis of MENORRAGIA  The various methods of treatment have been discussed with the patient and family. After consideration of risks, benefits and other options for treatment, the patient has consented to  Procedure(s): [place] (N/A) as a surgical intervention .  The patient's history has been reviewed, patient examined, no change in status, stable for surgery.  I have reviewed the patient's chart and labs.  Questions were answered to the patient's satisfaction.     Hoyt Koch

## 2018-08-27 NOTE — Anesthesia Postprocedure Evaluation (Signed)
Anesthesia Post Note  Patient: Molly Stone  Procedure(s) Performed: DILATATION & CURETTAGE/HYSTEROSCOPY WITH MINERVA (N/A )  Patient location during evaluation: PACU Anesthesia Type: General Level of consciousness: awake and alert and oriented Pain management: pain level controlled Vital Signs Assessment: post-procedure vital signs reviewed and stable Respiratory status: spontaneous breathing Cardiovascular status: blood pressure returned to baseline Anesthetic complications: no     Last Vitals:  Vitals:   08/27/18 1623 08/27/18 1642  BP: 128/61 125/72  Pulse: 91 88  Resp: 16   Temp: (!) 36.1 C 36.7 C  SpO2: 98% 99%    Last Pain:  Vitals:   08/27/18 1642  TempSrc:   PainSc: 4                  Leonie Amacher

## 2018-08-27 NOTE — Transfer of Care (Signed)
Immediate Anesthesia Transfer of Care Note  Patient: Molly Stone  Procedure(s) Performed: DILATATION & CURETTAGE/HYSTEROSCOPY WITH MINERVA (N/A )  Patient Location: PACU  Anesthesia Type:General  Level of Consciousness: awake, alert  and oriented  Airway & Oxygen Therapy: Patient Spontanous Breathing and Patient connected to face mask oxygen  Post-op Assessment: Report given to RN and Post -op Vital signs reviewed and stable  Post vital signs: Reviewed and stable  Last Vitals:  Vitals Value Taken Time  BP    Temp    Pulse    Resp    SpO2      Last Pain:  Vitals:   08/27/18 1349  TempSrc: Tympanic  PainSc: 1          Complications: No apparent anesthesia complications

## 2018-08-27 NOTE — Anesthesia Post-op Follow-up Note (Signed)
Anesthesia QCDR form completed.        

## 2018-08-28 ENCOUNTER — Telehealth: Payer: Self-pay

## 2018-08-28 ENCOUNTER — Encounter: Payer: Self-pay | Admitting: Obstetrics & Gynecology

## 2018-08-28 NOTE — Telephone Encounter (Signed)
Pt was told to call if she has any nausea.  She is nauseous today.  [phone]

## 2018-08-28 NOTE — Addendum Note (Signed)
Addendum  created 08/28/18 1353 by Doreen Salvage, CRNA   Charge Capture section accepted

## 2018-08-28 NOTE — Telephone Encounter (Signed)
Pt aware she can take nausea medication after surgery

## 2018-09-03 ENCOUNTER — Encounter: Payer: Self-pay | Admitting: Obstetrics & Gynecology

## 2018-09-03 ENCOUNTER — Ambulatory Visit (INDEPENDENT_AMBULATORY_CARE_PROVIDER_SITE_OTHER): Payer: Commercial Managed Care - PPO | Admitting: Obstetrics & Gynecology

## 2018-09-03 VITALS — BP 130/90 | Ht 69.0 in | Wt 290.0 lb

## 2018-09-03 DIAGNOSIS — N920 Excessive and frequent menstruation with regular cycle: Secondary | ICD-10-CM

## 2018-09-03 DIAGNOSIS — N946 Dysmenorrhea, unspecified: Secondary | ICD-10-CM

## 2018-09-03 NOTE — Progress Notes (Signed)
  Postoperative Follow-up Patient presents post op from ablation for abnormal uterine bleeding, 2 weeks ago.  Subjective: Patient reports marked improvement in her preop symptoms. Eating a regular diet without difficulty. The patient is not having any pain.  Activity: normal activities of daily living. Patient reports additional symptom's since surgery of None.  Objective: BP 130/90   Ht 5\' 9"  (1.753 m)   Wt 290 lb (131.5 kg)   BMI 42.83 kg/m  Physical Exam  Constitutional: She is oriented to person, place, and time. She appears well-developed and well-nourished. No distress.  Musculoskeletal: Normal range of motion.  Neurological: She is alert and oriented to person, place, and time.  Skin: Skin is warm and dry.  Psychiatric: She has a normal mood and affect.  Vitals reviewed.   Assessment: s/p :  endometrial ablation progressing well  Plan: Patient has done well after surgery with no apparent complications.  I have discussed the post-operative course to date, and the expected progress moving forward.  The patient understands what complications to be concerned about.  I will see the patient in routine follow up, or sooner if needed.    Activity plan: No restriction.  Hoyt Koch 09/03/2018, 4:17 PM

## 2018-09-10 ENCOUNTER — Ambulatory Visit: Payer: Self-pay

## 2018-09-10 NOTE — Telephone Encounter (Signed)
Returned call to patient who states that over the past month she has had symptoms associated to HR. She states that she has been checked several times by urgent care and even EMS visited her.  She did not go to the hospital when EMS came. She describes feeling light headed and flush with a feeling of her heart fluttering. She sometimes has a stabbing feeling in her chest. She states she has had an instance with her legs swelling and has had a D dimer done at urgent care because her family has a history of blood clot issues. She was advised to follow up with her PCP but didn't because of family illnesses. Pt has no symptoms today.  She states her resting HR is always high. Her BP has also been running high but she was unsure of the reading today. Pt used deep breathing exercises to slow things Appointment scheduled per protocol. Care advice read to patient. Pt verbalized understanding of all instructions.  Reason for Disposition . [1] Palpitations AND [2] no improvement after using CARE ADVICE  Answer Assessment - Initial Assessment Questions 1. DESCRIPTION: "Please describe your heart rate or heart beat that you are having" (e.g., fast/slow, regular/irregular, skipped or extra beats, "palpitations")     flutters 2. ONSET: "When did it start?" (Minutes, hours or days)      month 3. DURATION: "How long does it last" (e.g., seconds, minutes, hours)     hours 4. PATTERN "Does it come and go, or has it been constant since it started?"  "Does it get worse with exertion?"   "Are you feeling it now?"     Worse when walking very SOB 5. TAP: "Using your hand, can you tap out what you are feeling on a chair or table in front of you, so that I can hear?" (Note: not all patients can do this)       Reg now 6. HEART RATE: "Can you tell me your heart rate?" "How many beats in 15 seconds?"  (Note: not all patients can do this)       70 7. RECURRENT SYMPTOM: "Have you ever had this before?" If so, ask: "When was the  last time?" and "What happened that time?"      recurrent last was yesterday 8. CAUSE: "What do you think is causing the palpitations?"     Possible anxiety 9. CARDIAC HISTORY: "Do you have any history of heart disease?" (e.g., heart attack, angina, bypass surgery, angioplasty, arrhythmia)      blood clot issues in family Heart disease 10. OTHER SYMPTOMS: "Do you have any other symptoms?" (e.g., dizziness, chest pain, sweating, difficulty breathing)       dizzyness and SOB when hapening 11. PREGNANCY: "Is there any chance you are pregnant?" "When was your last menstrual period?"       No ablasion  Protocols used: HEART RATE AND HEARTBEAT QUESTIONS-A-AH

## 2018-09-11 ENCOUNTER — Other Ambulatory Visit: Payer: Self-pay | Admitting: Internal Medicine

## 2018-09-11 ENCOUNTER — Ambulatory Visit
Admission: RE | Admit: 2018-09-11 | Discharge: 2018-09-11 | Disposition: A | Discharge status: Home / Self Care | Payer: Commercial Managed Care - PPO | Source: Ambulatory Visit | Attending: Internal Medicine | Admitting: Internal Medicine

## 2018-09-11 ENCOUNTER — Encounter: Payer: Self-pay | Admitting: Internal Medicine

## 2018-09-11 ENCOUNTER — Ambulatory Visit: Payer: Commercial Managed Care - PPO | Admitting: Internal Medicine

## 2018-09-11 VITALS — BP 128/84 | HR 104 | Temp 97.8°F | Resp 16 | Ht 69.0 in | Wt 298.6 lb

## 2018-09-11 DIAGNOSIS — R232 Flushing: Secondary | ICD-10-CM | POA: Diagnosis not present

## 2018-09-11 DIAGNOSIS — R079 Chest pain, unspecified: Secondary | ICD-10-CM

## 2018-09-11 DIAGNOSIS — I471 Supraventricular tachycardia: Secondary | ICD-10-CM | POA: Diagnosis not present

## 2018-09-11 DIAGNOSIS — R0789 Other chest pain: Secondary | ICD-10-CM

## 2018-09-11 LAB — CBC WITH DIFFERENTIAL/PLATELET
Basophils Absolute: 0 10*3/uL (ref 0.0–0.1)
Basophils Relative: 1.4 % (ref 0.0–3.0)
EOS ABS: 0.2 10*3/uL (ref 0.0–0.7)
Eosinophils Relative: 7.1 % — ABNORMAL HIGH (ref 0.0–5.0)
HEMATOCRIT: 40 % (ref 36.0–46.0)
HEMOGLOBIN: 13.5 g/dL (ref 12.0–15.0)
LYMPHS PCT: 47.2 % — AB (ref 12.0–46.0)
Lymphs Abs: 1.5 10*3/uL (ref 0.7–4.0)
MCHC: 33.7 g/dL (ref 30.0–36.0)
MCV: 88.8 fl (ref 78.0–100.0)
Monocytes Absolute: 0.4 10*3/uL (ref 0.1–1.0)
Monocytes Relative: 13.6 % — ABNORMAL HIGH (ref 3.0–12.0)
Neutro Abs: 1 10*3/uL — ABNORMAL LOW (ref 1.4–7.7)
Neutrophils Relative %: 30.7 % — ABNORMAL LOW (ref 43.0–77.0)
PLATELETS: 185 10*3/uL (ref 150.0–400.0)
RBC: 4.5 Mil/uL (ref 3.87–5.11)
RDW: 12.6 % (ref 11.5–15.5)
WBC: 3.1 10*3/uL — AB (ref 4.0–10.5)

## 2018-09-11 LAB — BASIC METABOLIC PANEL
BUN: 8 mg/dL (ref 6–23)
CO2: 26 mEq/L (ref 19–32)
Calcium: 8.6 mg/dL (ref 8.4–10.5)
Chloride: 106 mEq/L (ref 96–112)
Creatinine, Ser: 0.72 mg/dL (ref 0.40–1.20)
GFR: 97.11 mL/min (ref >60.00)
Glucose, Bld: 101 mg/dL — ABNORMAL HIGH (ref 70–99)
Potassium: 4.2 mEq/L (ref 3.5–5.1)
SODIUM: 139 meq/L (ref 135–145)

## 2018-09-11 LAB — CK TOTAL AND CKMB (NOT AT ARMC): CK TOTAL: 46 U/L (ref 29–143)

## 2018-09-11 LAB — MAGNESIUM: MAGNESIUM: 2.1 mg/dL (ref 1.5–2.5)

## 2018-09-11 LAB — TROPONIN I: TNIDX: 0.01 ug/l (ref 0.00–0.06)

## 2018-09-11 LAB — TSH: TSH: 0.53 u[IU]/mL (ref 0.35–4.50)

## 2018-09-11 LAB — D-DIMER, QUANTITATIVE: D-Dimer, Quant: 0.51 mcg/mL FEU — ABNORMAL HIGH (ref <0.50)

## 2018-09-11 LAB — T4, FREE: FREE T4: 1.05 ng/dL (ref 0.60–1.60)

## 2018-09-11 MED ORDER — METOPROLOL SUCCINATE ER 25 MG PO TB24: 25.0000 mg | ORAL_TABLET | Freq: Every day | ORAL | 3 refills | Status: DC

## 2018-09-11 NOTE — Patient Instructions (Signed)
I am starting you on a low dose of metoprolol to control your heart rate  Labs and chest x ray today  Cardiology referral has been made   Supraventricular Tachycardia, Adult Supraventricular tachycardia (SVT) is a kind of abnormal heartbeat. It makes your heart beat very fast and then beat at a normal speed. A normal heart beats 60-100 times a minute. This condition can make your heart beat more than 150 times a minute. Times of having a fast heartbeat (episodes) can be scary, but they are usually not dangerous. They can lead to problems if:  They happen often.  They last a long time.  Symptoms of this condition include:  A pounding heart.  A feeling that your heart is skipping beats (palpitations).  Weakness.  Trouble getting enough air (shortness of breath).  Pain or tightness in your chest.  Feeling like you are going to pass out (light-headedness).  Feeling worried or nervous (anxiety).  Dizziness.  Sweating.  Feeling sick to your stomach (nausea).  Passing out (fainting).  Tiredness.  Sometimes, there are no symptoms. Follow these instructions at home: Stress  Avoid things that make you feel stressed.  Find out what helps you feel less stressed. Try: ? Doing a relaxing activity, like yoga, meditation, or being out in nature. ? Listening to relaxing music. ? Doing relaxation techniques, like deep breathing. ? Taking steps to be healthy. These include getting lots of sleep, exercising, and eating a balanced diet. ? Talking with a mental health doctor. Sleep  Try to get at least 7 hours of sleep each night. Tobacco and nicotine  Do not use anything that has nicotine or tobacco, such as cigarettes and e-cigarettes. If you need help quitting, ask your doctor. Alcohol  If alcohol gives you a fast heartbeat, do not drink alcohol.  If alcohol does not seem to give you a fast heartbeat, limit your alcohol. For nonpregnant women, this means no more than 1  drink a day. For men, this means no more than 2 drinks a day. "One drink" means one of these: ? 12 oz of beer. ? 5 oz of wine. ? 1 oz of hard liquor. Caffeine  If caffeine gives you a fast heartbeat, do not eat, drink, or use anything with caffeine in it.  If caffeine does not seem to give you a fast heartbeat, limit how much caffeine you eat, drink, or use. Stimulant drugs  Do not use stimulant drugs. These are drugs like cocaine or methamphetamine. If you need help quitting, ask your doctor. General instructions  Stay at a healthy weight.  Exercise regularly. Ask your doctor to suggest some good activities for you. Try one of these options: ? 150 minutes a week of gentle exercise, like walking or yoga. ? 75 minutes a week of exercise that is very active, like running or swimming. ? A combination of gentle exercise and very active exercise.  Do home treatments to slow down your heartbeat as told by your doctor.  Take over-the-counter and prescription medicines only as told by your doctor. Contact a doctor if:  You have a fast heartbeat more often.  Times of having a fast heartbeat last longer than before.  Your home treatments to slow down your heartbeat do not help.  You have new symptoms. Get help right away if:  You have chest pain.  Your symptoms get worse.  You have trouble breathing.  Your heart beats very fast for more than 20 minutes.  You pass out (  faint). These symptoms may be an emergency. Do not wait to see if the symptoms will go away. Get medical help right away. Call your local emergency services (911 in the [place] Do not drive yourself to the hospital. This information is not intended to replace advice given to you by your health care provider. Make sure you discuss any questions you have with your health care provider. Document Released: 10/07/2005 Document Revised: 06/13/2016 Document Reviewed: 06/13/2016 Elsevier Interactive Patient Education  2017  [employer].

## 2018-09-11 NOTE — Progress Notes (Signed)
Subjective:  Patient ID: Molly Stone, female    DOB: [birth year]  Age: 37 y.o. MRN: [id number]  CC: The primary encounter diagnosis was Paroxysmal supraventricular tachycardia ([place]). Diagnoses of Vasomotor flushing, Chest pain of uncertain etiology, and SVT (supraventricular tachycardia) ([place]) were also pertinent to this visit.  HPI Molly Stone presents for evaluation and treatment of palpitations accompanied by hypertension .  Has occurred.2 times in one month   History:  Several episodes of racing heart.  One episode last year occurred after dinner while shopping, started with vertigo .  Check BP (was at [employer],  diastolic BP was 629,  Went to Urgent Care and told she was mildly hypertensive). Due to [place] of coagulopathy ,  She was sent to [facility]   Most recent episode several days ago occurred at home  After using albuterol and Symbicort MDIs.  HR jumped (as expected) but developed stabbing chest pain. Went outside to breathe  Deeply , came back in  Developed vertigo,  Laid down for 45 minutes, then went on whith dinner ,  Chores,  Then developed stabbing pain in lower abdomen , felt bad,  Checked  HR  pulse was 140's and husband called EMS   History of  sinus tach ,  ER vsit June 2018 History of OSA by study 2107  But has not been wearing her CPAP since diagnosis and can't afford the mouth piece recmmmended by d    "I feel like my body hates me."    Drinks only one cup of coffee daily.  Drinks 8 or 9 bottles of water daily (histor of kidney stones)  Had a uterine ablation last week by Dr Kenton Kingfisher for  menorrhagia.     Having episode of flushing  and sweating  for the past several weeks,  occurs 1-2  Times in the am.  None during the day,  Mostly at night   Outpatient Medications Prior to Visit  Medication Sig Dispense Refill  . albuterol (PROVENTIL HFA;VENTOLIN HFA) 108 (90 Base) MCG/ACT inhaler Inhale 2 puffs into the lungs every 6 (six) hours as needed for wheezing or shortness  of breath. 18 g 3  . budesonide-formoterol (SYMBICORT) 160-4.5 MCG/ACT inhaler Inhale 2 puffs into the lungs 2 (two) times daily. (Patient taking differently: Inhale 2 puffs into the lungs daily. ) 1 Inhaler 5  . calcium carbonate (TUMS - DOSED IN MG ELEMENTAL CALCIUM) 500 MG chewable tablet Chew 2 tablets by mouth daily.    . fluticasone (FLONASE) 50 MCG/ACT nasal spray Place 1 spray into both nostrils daily. (Patient taking differently: Place 1 spray into both nostrils 2 (two) times daily. ) 15.8 g 5  . levocetirizine (XYZAL) 5 MG tablet Take 5 mg by mouth daily as needed for allergies.    . Menthol-Methyl Salicylate (MUSCLE RUB EX) Apply 1 application topically daily as needed (pain). "Stop Pain"    . montelukast (SINGULAIR) 10 MG tablet TAKE 1 TABLET AT BEDTIME 90 tablet 1  . Multiple Vitamin (MULTIVITAMIN WITH MINERALS) TABS tablet Take 1 tablet by mouth daily.    . pantoprazole (PROTONIX) 40 MG tablet Take 1 tablet (40 mg total) by mouth 2 (two) times daily. 60 tablet 2  . acetaminophen (TYLENOL) 500 MG tablet Take 500 mg by mouth daily as needed for headache.    . busPIRone (BUSPAR) 15 MG tablet TAKE 1 TABLET(15 MG) BY MOUTH THREE TIMES DAILY 90 tablet 1  . diazepam (VALIUM) 2 MG tablet Take 2 mg by mouth  every 6 (six) hours as needed (esophageal spasms).     Marland Kitchen ibuprofen (ADVIL,MOTRIN) 200 MG tablet Take 200 mg by mouth daily as needed for headache or moderate pain.    Marland Kitchen ibuprofen (ADVIL,MOTRIN) 600 MG tablet Take 1 tablet (600 mg total) by mouth every 6 (six) hours as needed for moderate pain. 60 tablet 3  . mupirocin ointment (BACTROBAN) 2 % Place 1 application into the nose 2 (two) times daily. 22 g 0  . tamsulosin (FLOMAX) 0.4 MG CAPS capsule Take 1 capsule (0.4 mg total) by mouth daily. For kidney stone 30 capsule 0   Facility-Administered Medications Prior to Visit  Medication Dose Route Frequency Provider Last Rate Last Dose  . methacholine (PROVOCHOLINE) inhaler solution 8 mg  2 mL  Inhalation Once Crecencio Mc, MD       Followed by  . methacholine (PROVOCHOLINE) inhaler solution 32 mg  2 mL Inhalation Once Crecencio Mc, MD       Followed by  . albuterol (PROVENTIL) (2.5 MG/3ML) 0.083% nebulizer solution 2.5 mg  2.5 mg Nebulization Once Crecencio Mc, MD        Review of Systems;  Patient denies headache, fevers, malaise, unintentional weight loss, skin rash, eye pain, sinus congestion and sinus pain, sore throat, dysphagia,  hemoptysis , cough, dyspnea, wheezing, chest pain, palpitations, orthopnea, edema, abdominal pain, nausea, melena, diarrhea, constipation, flank pain, dysuria, hematuria, urinary  Frequency, nocturia, numbness, tingling, seizures,  Focal weakness, Loss of consciousness,  Tremor, insomnia, depression, anxiety, and suicidal ideation.      Objective:  BP 128/84 (BP Location: Left Arm, Patient Position: Sitting, Cuff Size: Normal)   Pulse (!) 104   Temp 97.8 F (36.6 C) (Oral)   Resp 16   Ht 5\' 9"  (1.753 m)   Wt 298 lb 9.6 oz (135.4 kg)   SpO2 97%   BMI 44.10 kg/m   BP Readings from Last 3 Encounters:  09/11/18 128/84  09/03/18 130/90  08/27/18 125/72    Wt Readings from Last 3 Encounters:  09/11/18 298 lb 9.6 oz (135.4 kg)  09/03/18 290 lb (131.5 kg)  08/27/18 294 lb (133.4 kg)    General appearance: alert, cooperative and appears stated age Ears: normal TM's and external ear canals both ears Throat: lips, mucosa, and tongue normal; teeth and gums normal Neck: no adenopathy, no carotid bruit, supple, symmetrical, trachea midline and thyroid not enlarged, symmetric, no tenderness/mass/nodules Back: symmetric, no curvature. ROM normal. No CVA tenderness. Lungs: clear to auscultation bilaterally Heart: regular rate and rhythm, S1, S2 normal, no murmur, click, rub or gallop Abdomen: soft, non-tender; bowel sounds normal; no masses,  no organomegaly Pulses: 2+ and symmetric Skin: Skin color, texture, turgor normal. No rashes or  lesions Lymph nodes: Cervical, supraclavicular, and axillary nodes normal.  No results found for: HGBA1C  Lab Results  Component Value Date   CREATININE 0.72 09/11/2018   CREATININE 0.62 06/09/2018   CREATININE 0.81 07/27/2017    Lab Results  Component Value Date   WBC 3.1 (L) 09/11/2018   HGB 13.5 09/11/2018   HCT 40.0 09/11/2018   PLT 185.0 09/11/2018   GLUCOSE 101 (H) 09/11/2018   CHOL 195 07/23/2016   TRIG 105.0 07/23/2016   HDL 40.90 07/23/2016   LDLDIRECT 155 (H) 08/18/2015   LDLCALC 133 (H) 07/23/2016   ALT 24 07/27/2017   AST 23 07/27/2017   NA 139 09/11/2018   K 4.2 09/11/2018   CL 106 09/11/2018   CREATININE 0.72  09/11/2018   BUN 8 09/11/2018   CO2 26 09/11/2018   TSH 0.53 09/11/2018    No results found.  Assessment & Plan:   Problem List Items Addressed This Visit    SVT (supraventricular tachycardia) ([place])    Screening labs are not contributory .    She is in sinus rhythm based on my review of her EKG from today. starting low dose metoprolol and referring to cardiology for workup .  Untreated sleep apnea contributing   Lab Results  Component Value Date   TSH 0.53 09/11/2018   Lab Results  Component Value Date   WBC 3.1 (L) 09/11/2018   HGB 13.5 09/11/2018   HCT 40.0 09/11/2018   MCV 88.8 09/11/2018   PLT 185.0 09/11/2018   .lastmg      Relevant Medications   metoprolol succinate (TOPROL-XL) 25 MG 24 hr tablet    Other Visit Diagnoses    Paroxysmal supraventricular tachycardia ([facility])    -  Primary   Relevant Medications   metoprolol succinate (TOPROL-XL) 25 MG 24 hr tablet   Other Relevant Orders   Magnesium (Completed)   Basic metabolic panel (Completed)   TSH (Completed)   CBC with Differential/Platelet (Completed)   D-Dimer, Quantitative (Completed)   T4, free (Completed)   EKG 12-Lead (Completed)   Ambulatory referral to Cardiology   Vasomotor flushing       Relevant Medications   metoprolol succinate (TOPROL-XL) 25 MG 24 hr  tablet   Other Relevant Orders   TSH (Completed)   T4, free (Completed)   Chest pain of uncertain etiology       Relevant Orders   CK total and CKMB (cardiac)not at [facility] (Completed)   Troponin I - (Completed)   Ambulatory referral to Cardiology      I have discontinued Czarina M. Deleeuw's mupirocin ointment, busPIRone, tamsulosin, diazepam, ibuprofen, ibuprofen, and acetaminophen. I am also having her start on metoprolol succinate. Additionally, I am having her maintain her albuterol, budesonide-formoterol, fluticasone, montelukast, pantoprazole, calcium carbonate, multivitamin with minerals, levocetirizine, and Menthol-Methyl Salicylate (MUSCLE RUB EX).  Meds ordered this encounter  Medications  . metoprolol succinate (TOPROL-XL) 25 MG 24 hr tablet    Sig: Take 1 tablet (25 mg total) by mouth daily.    Dispense:  90 tablet    Refill:  3    Medications Discontinued During This Encounter  Medication Reason  . mupirocin ointment (BACTROBAN) 2 % Patient Preference  . busPIRone (BUSPAR) 15 MG tablet Patient Preference  . tamsulosin (FLOMAX) 0.4 MG CAPS capsule Patient Preference  . diazepam (VALIUM) 2 MG tablet Patient Preference  . ibuprofen (ADVIL,MOTRIN) 600 MG tablet Patient Preference  . ibuprofen (ADVIL,MOTRIN) 200 MG tablet Patient Preference  . acetaminophen (TYLENOL) 500 MG tablet Patient Preference    Follow-up: No follow-ups on file.   Crecencio Mc, MD

## 2018-09-12 DIAGNOSIS — I471 Supraventricular tachycardia: Secondary | ICD-10-CM | POA: Insufficient documentation

## 2018-09-12 NOTE — Assessment & Plan Note (Addendum)
Screening labs are not contributory .    She is in sinus rhythm based on my review of her EKG from today. starting low dose metoprolol and referring to cardiology for workup .  Untreated sleep apnea contributing   Lab Results  Component Value Date   TSH 0.53 09/11/2018   Lab Results  Component Value Date   WBC 3.1 (L) 09/11/2018   HGB 13.5 09/11/2018   HCT 40.0 09/11/2018   MCV 88.8 09/11/2018   PLT 185.0 09/11/2018   .lastmg

## 2018-09-16 DIAGNOSIS — J019 Acute sinusitis, unspecified: Secondary | ICD-10-CM | POA: Diagnosis not present

## 2018-09-17 ENCOUNTER — Other Ambulatory Visit: Payer: Self-pay

## 2018-09-17 ENCOUNTER — Encounter: Payer: Self-pay | Admitting: Intensive Care

## 2018-09-17 ENCOUNTER — Emergency Department: Payer: Commercial Managed Care - PPO

## 2018-09-17 ENCOUNTER — Emergency Department
Admission: EM | Admit: 2018-09-17 | Discharge: 2018-09-17 | Disposition: A | Discharge status: Home / Self Care | Payer: Commercial Managed Care - PPO | Attending: Emergency Medicine | Admitting: Emergency Medicine

## 2018-09-17 DIAGNOSIS — J45909 Unspecified asthma, uncomplicated: Secondary | ICD-10-CM | POA: Insufficient documentation

## 2018-09-17 DIAGNOSIS — R109 Unspecified abdominal pain: Secondary | ICD-10-CM | POA: Diagnosis not present

## 2018-09-17 DIAGNOSIS — Z79899 Other long term (current) drug therapy: Secondary | ICD-10-CM | POA: Insufficient documentation

## 2018-09-17 DIAGNOSIS — Z87891 Personal history of nicotine dependence: Secondary | ICD-10-CM | POA: Diagnosis not present

## 2018-09-17 DIAGNOSIS — D179 Benign lipomatous neoplasm, unspecified: Secondary | ICD-10-CM | POA: Diagnosis not present

## 2018-09-17 DIAGNOSIS — I10 Essential (primary) hypertension: Secondary | ICD-10-CM | POA: Diagnosis not present

## 2018-09-17 LAB — COMPREHENSIVE METABOLIC PANEL
ALK PHOS: 58 U/L (ref 38–126)
ALT: 18 U/L (ref 0–44)
AST: 20 U/L (ref 15–41)
Albumin: 4 g/dL (ref 3.5–5.0)
Anion gap: 9 (ref 5–15)
BUN: 13 mg/dL (ref 6–20)
CO2: 25 mmol/L (ref 22–32)
Calcium: 9 mg/dL (ref 8.9–10.3)
Chloride: 106 mmol/L (ref 98–111)
Creatinine, Ser: 0.7 mg/dL (ref 0.44–1.00)
GFR calc Af Amer: >60 mL/min (ref >60)
GFR calc non Af Amer: >60 mL/min (ref >60)
Glucose, Bld: 120 mg/dL — ABNORMAL HIGH (ref 70–99)
Potassium: 4 mmol/L (ref 3.5–5.1)
Sodium: 140 mmol/L (ref 135–145)
Total Bilirubin: 0.5 mg/dL (ref 0.3–1.2)
Total Protein: 7.4 g/dL (ref 6.5–8.1)

## 2018-09-17 LAB — URINALYSIS, COMPLETE (UACMP) WITH MICROSCOPIC
Bilirubin Urine: NEGATIVE
Glucose, UA: NEGATIVE mg/dL
Ketones, ur: NEGATIVE mg/dL
Nitrite: NEGATIVE
Protein, ur: NEGATIVE mg/dL
Specific Gravity, Urine: 1.006 (ref 1.005–1.030)
pH: 6 (ref 5.0–8.0)

## 2018-09-17 LAB — CBC
HCT: 41.3 % (ref 36.0–46.0)
HEMOGLOBIN: 13.5 g/dL (ref 12.0–15.0)
MCH: 29.5 pg (ref 26.0–34.0)
MCHC: 32.7 g/dL (ref 30.0–36.0)
MCV: 90.4 fL (ref 80.0–100.0)
Platelets: 209 10*3/uL (ref 150–400)
RBC: 4.57 MIL/uL (ref 3.87–5.11)
RDW: 12.1 % (ref 11.5–15.5)
WBC: 5 10*3/uL (ref 4.0–10.5)
nRBC: 0 % (ref 0.0–0.2)

## 2018-09-17 LAB — LIPASE, BLOOD: Lipase: 28 U/L (ref 11–51)

## 2018-09-17 LAB — PREGNANCY, URINE: Preg Test, Ur: NEGATIVE

## 2018-09-17 MED ORDER — ALUM & MAG HYDROXIDE-SIMETH 200-200-20 MG/5ML PO SUSP
30.0000 mL | Freq: Once | ORAL | Status: AC
  Administered 2018-09-17: 30 mL via ORAL
  Filled 2018-09-17: qty 30

## 2018-09-17 MED ORDER — IOPAMIDOL (ISOVUE-300) INJECTION 61%
30.0000 mL | Freq: Once | INTRAVENOUS | Status: AC | PRN
  Administered 2018-09-17: 30 mL via ORAL

## 2018-09-17 MED ORDER — IOPAMIDOL (ISOVUE-300) INJECTION 61%
100.0000 mL | Freq: Once | INTRAVENOUS | Status: AC | PRN
  Administered 2018-09-17: 100 mL via INTRAVENOUS

## 2018-09-17 MED ORDER — IBUPROFEN 800 MG PO TABS
800.0000 mg | ORAL_TABLET | ORAL | Status: AC
  Administered 2018-09-17: 800 mg via ORAL
  Filled 2018-09-17: qty 1

## 2018-09-17 NOTE — ED Provider Notes (Signed)
[facility] Emergency Department Provider Note   ____________________________________________   First MD Initiated Contact with Patient 09/17/18 1721     (approximate)  I have reviewed the triage vital signs and the nursing notes.   HISTORY  Chief Complaint Abdominal Pain (left side)    HPI Molly Stone is a 37 y.o. female presents for evaluation of abdominal pain  Patient has noticed over the last couple days she has had intermittent episodes of somewhat sharp discomfort in her mid abdomen.  Seems to be more on the left side but sometimes shoots over towards the right around the area of the bellybutton.  No vomiting.  Some slight times of nausea.  Reports it came and went a couple of x2 days ago, not really sure if anything caused it.  Then today she felt like decreased appetite and began experiencing the pain again after trying to eat small amounts of food for Thanksgiving.  She did not vomit afterwards, and she reports the pain has largely subsided now.  Some very minimal, still a slight sharp discomfort and seems more left-sided in the left back.  Reports somewhat similar discomfort with kidney stones in the past  No fevers or chills.  No black or bloody stool.  No chest pain or trouble breathing.  Denies pregnancy.   Past Medical History:  Diagnosis Date  . Allergy   . Anemia   . Anxiety   . Asthma   . Chlamydia    h/o  . Disorder of vocal cord    spasmodic dysphonia  . Dysmenorrhea   . Dyspareunia, female   . Endometriosis   . Family history of endometriosis   . GERD (gastroesophageal reflux disease)   . Headache    migraine  . Heavy periods   . History of nephrolithiasis   . Hypertension   . Jaundice, physiologic, newborn   . Obesity (BMI 30-39.9)   . Sleep apnea   . Tobacco user   . Vaginal Pap smear, abnormal     Patient Active Problem List   Diagnosis Date Noted  . SVT (supraventricular tachycardia) ([place]) 09/12/2018  .  History of right flank pain 04/14/2018  . Acute pain of left shoulder 03/26/2018  . Calculi, ureter 07/30/2017  . Asthma 03/27/2017  . Chronic bilateral thoracic back pain 03/07/2017  . Spinal stenosis 03/05/2017  . Numbness and tingling 02/03/2017  . Shortness of breath 12/20/2016  . Whiplash injury syndrome, subsequent encounter 08/04/2016  . Concussion without loss of consciousness 08/04/2016  . OSA on CPAP 07/24/2016  . Generalized anxiety disorder 10/25/2015  . Tension headache, chronic 10/25/2015  . Family history of endometriosis 10/12/2015  . Dysmenorrhea 10/12/2015  . Acid reflux 08/15/2015  . H/O renal calculi 08/15/2015  . Obesity 08/15/2015  . Other malaise and fatigue 06/16/2014  . Encounter for preventive health examination 06/16/2014  . Malaise and fatigue 06/16/2014  . Sciatica 04/01/2014  . Vitamin D deficiency 04/12/2012  . 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  . History of nephrolithiasis   . GERD (gastroesophageal reflux disease)     Past Surgical History:  Procedure Laterality Date  . DILATATION & CURETTAGE/HYSTEROSCOPY WITH MYOSURE N/A 08/27/2018   Procedure: DILATATION & CURETTAGE/HYSTEROSCOPY WITH MINERVA;  Surgeon: Gae Dry, MD;  Location: [facility] ORS;  Service: Gynecology;  Laterality: N/A;  . DILATION AND CURETTAGE OF UTERUS    . ESOPHAGOGASTRODUODENOSCOPY (EGD) WITH PROPOFOL N/A 08/07/2018   Procedure: ESOPHAGOGASTRODUODENOSCOPY (EGD) WITH PROPOFOL;  Surgeon: Jonathon Bellows, MD;  Location:  [facility] ENDOSCOPY;  Service: Gastroenterology;  Laterality: N/A;  . laparoscopy    . WISDOM TOOTH EXTRACTION      Prior to Admission medications   Medication Sig Start Date End Date Taking? Authorizing Provider  albuterol (PROVENTIL HFA;VENTOLIN HFA) 108 (90 Base) MCG/ACT inhaler Inhale 2 puffs into the lungs every 6 (six) hours as needed for wheezing or shortness of breath. 03/24/17  Yes Flora Lipps, MD  amoxicillin (AMOXIL) 500 MG capsule Take 500 mg by mouth 2 (two)  times daily.   Yes [provider]  budesonide-formoterol (SYMBICORT) 160-4.5 MCG/ACT inhaler Inhale 2 puffs into the lungs 2 (two) times daily. Patient taking differently: Inhale 2 puffs into the lungs daily.  03/24/17  Yes Flora Lipps, MD  calcium carbonate (TUMS - DOSED IN MG ELEMENTAL CALCIUM) 500 MG chewable tablet Chew 2 tablets by mouth daily.   Yes [provider]  fluticasone (FLONASE) 50 MCG/ACT nasal spray Place 1 spray into both nostrils daily. Patient taking differently: Place 1 spray into both nostrils 2 (two) times daily.  07/25/17  Yes Crecencio Mc, MD  levocetirizine (XYZAL) 5 MG tablet Take 5 mg by mouth daily as needed for allergies.   Yes [provider]  Menthol-Methyl Salicylate (MUSCLE RUB EX) Apply 1 application topically daily as needed (pain). "Stop Pain"   Yes [provider]  metoprolol succinate (TOPROL-XL) 25 MG 24 hr tablet Take 1 tablet (25 mg total) by mouth daily. 09/11/18  Yes Crecencio Mc, MD  montelukast (SINGULAIR) 10 MG tablet TAKE 1 TABLET AT BEDTIME 06/02/18  Yes Crecencio Mc, MD  pantoprazole (PROTONIX) 40 MG tablet Take 1 tablet (40 mg total) by mouth 2 (two) times daily. 08/17/18 11/15/18 Yes Jonathon Bellows, MD  Multiple Vitamin (MULTIVITAMIN WITH MINERALS) TABS tablet Take 1 tablet by mouth daily.    [provider]    Allergies Buprenorphine hcl; Morphine and related; Prednisone; and Tramadol hcl  Family History  Problem Relation Age of Onset  . Endometriosis Mother   . Cancer Father        benign liver CA  . Stroke Father        recurrent blood clots on the brain   . Cancer Maternal Grandmother 30       ovarian Ca, still surviving   . Osteoporosis Maternal Grandmother   . Ovarian cancer Maternal Grandmother   . Heart disease Maternal Grandfather   . Cancer Maternal Grandfather 77       colon CA. metastatic  . Diabetes Maternal Uncle   . Heart disease Paternal Grandmother   . Breast cancer  Neg Hx   . Colon cancer Neg Hx   . Kidney cancer Neg Hx   . Bladder Cancer Neg Hx     Social History Social History   Tobacco Use  . Smoking status: Former Smoker    Types: Cigarettes    Last attempt to quit: 10/2016    Years since quitting: 1.9  . Smokeless tobacco: Never Used  Substance Use Topics  . Alcohol use: Not Currently    Alcohol/week: 0.0 standard drinks  . Drug use: No    Review of Systems Constitutional: No fever/chills Eyes: No visual changes. ENT: No sore throat. Cardiovascular: Denies chest pain. Respiratory: Denies shortness of breath. Gastrointestinal: See HPI Genitourinary: Negative for dysuria. Musculoskeletal: Negative for back pain. Skin: Negative for rash. Neurological: Negative for headaches, areas of focal weakness or numbness.    ____________________________________________   PHYSICAL EXAM:  VITAL SIGNS:  ED Triage Vitals  Enc Vitals Group     BP 09/17/18 1714 (!) 156/91     Pulse Rate 09/17/18 1714 (!) 109     Resp 09/17/18 1714 18     Temp 09/17/18 1714 98.2 F (36.8 C)     Temp Source 09/17/18 1714 Oral     SpO2 09/17/18 1714 96 %     Weight 09/17/18 1715 293 lb (132.9 kg)     Height 09/17/18 1715 5\' 9"  (1.753 m)     Head Circumference --      Peak Flow --      Pain Score 09/17/18 1714 4     Pain Loc --      Pain Edu? --      Excl. in [place]? --   Patient self reports to me that she has a history of chronic tachycardia.  Review of records indicates patient is on low-dose metoprolol, which patient states she is taking  Constitutional: Alert and oriented. Well appearing and in no acute distress.  Very pleasant.  Ambulatory in no distress. Eyes: Conjunctivae are normal. Head: Atraumatic. Nose: No congestion/rhinnorhea. Mouth/Throat: Mucous membranes are moist. Neck: No stridor.  Cardiovascular: Normal rate, regular rhythm. Grossly normal heart sounds.  Good peripheral circulation. Respiratory: Normal respiratory effort.  No  retractions. Lungs CTAB. Gastrointestinal: Soft and nontender except she reports some slight discomfort over the left costovertebral angle to percussion, and also some slight discomfort in the mid left flank without any rebound or guarding. No distention. Musculoskeletal: No lower extremity tenderness nor edema. Neurologic:  Normal speech and language. No gross focal neurologic deficits are appreciated.  Skin:  Skin is warm, dry and intact. No rash noted. Psychiatric: Mood and affect are normal. Speech and behavior are normal.  ____________________________________________   LABS (all labs ordered are listed, but only abnormal results are displayed)  Labs Reviewed  COMPREHENSIVE METABOLIC PANEL - Abnormal; Notable for the following components:      Result Value   Glucose, Bld 120 (*)    All other components within normal limits  URINALYSIS, COMPLETE (UACMP) WITH MICROSCOPIC - Abnormal; Notable for the following components:   Color, Urine YELLOW (*)    APPearance CLOUDY (*)    Hgb urine dipstick LARGE (*)    Leukocytes, UA SMALL (*)    Bacteria, UA RARE (*)    All other components within normal limits  URINE CULTURE  LIPASE, BLOOD  CBC  PREGNANCY, URINE   ____________________________________________  EKG   ____________________________________________  RADIOLOGY  Ct Abdomen Pelvis W Contrast  Result Date: 09/17/2018 CLINICAL DATA:  LEFT side abdominal pain, headache, head cervical ablation 2 weeks ago; history hypertension, former smoker, GERD, endometriosis, asthma EXAM: CT ABDOMEN AND PELVIS WITH CONTRAST TECHNIQUE: Multidetector CT imaging of the abdomen and pelvis was performed using the standard protocol following bolus administration of intravenous contrast. Sagittal and coronal MPR images reconstructed from axial data set. CONTRAST:  155mL ISOVUE-300 IOPAMIDOL (ISOVUE-300) INJECTION 61% IV. Dilute oral contrast. Upper COMPARISON:  04/14/2018 FINDINGS: Lower chest: Lung  bases clear Hepatobiliary: Gallbladder contracted.  Liver normal appearance. Pancreas: Normal appearance Spleen: Normal appearance of spleen. Splenule inferior to spleen, unchanged. Adrenals/Urinary Tract: Adrenal glands, kidneys, ureters, and bladder normal appearance Stomach/Bowel: Normal appendix. Stomach and bowel loops normal appearance. Vascular/Lymphatic: Vascular structures unremarkable. No adenopathy. Reproductive: Unremarkable uterus and adnexa Other: No free air or free fluid.  No hernia. Musculoskeletal: Small intramuscular lipoma of the LEFT iliopsoas. No acute osseous findings. IMPRESSION: No acute intra-abdominal  or intrapelvic abnormalities. Electronically Signed   By: Lavonia Dana M.D.   On: 09/17/2018 19:14    ____________________________________________   PROCEDURES  Procedure(s) performed: None  Procedures  Critical Care performed: No  ____________________________________________   INITIAL IMPRESSION / ASSESSMENT AND PLAN / ED COURSE  Pertinent labs & imaging results that were available during my care of the patient were reviewed by me and considered in my medical decision making (see chart for details).   Patient presents for evaluation of left-sided abdominal discomfort, sometimes central in nature.  No associated cardiac or pulmonary symptoms.  Awake alert with reassuring normal vital signs.  Ambulatory, reassuring clinical examination, but does have a history of kidney stones.  Denies any specific infectious symptoms.  After review of lab work, reassuring lab work urinalysis sent for culture she does not have urinary symptoms associated, and will send for culture as I suspect this may be slightly contaminated sample.  Discussed with patient further cough, discussed risks and benefits of CT scan due to the intermittent nature of her symptoms.     CT scan negative for acute cause.  Resting comfortably, discussed with her and she will continue on her proton pump  inhibitor, avoid use of NSAIDs, and plans to follow-up with her PCP.  Return precautions and treatment recommendations and follow-up discussed with the patient who is agreeable with the plan.       ____________________________________________   FINAL CLINICAL IMPRESSION(S) / ED DIAGNOSES  Final diagnoses:  Left sided abdominal pain        Note:  This document was prepared using Dragon voice recognition software and may include unintentional dictation errors       Delman Kitten, MD 09/17/18 1954

## 2018-09-17 NOTE — ED Notes (Signed)
CT notified pt is done drinking the contrast

## 2018-09-17 NOTE — ED Triage Notes (Signed)
Patient c/o left sided abdominal pain. Also c/o headache. Denies N/V. Reports having cervical ablation X2 weeks ago.

## 2018-09-17 NOTE — ED Notes (Signed)
Topaz pad not working when pt was discharged- pt unable to sign out.

## 2018-09-17 NOTE — Discharge Instructions (Addendum)
You were seen in the emergency room for abdominal pain. It is important that you follow up closely with your primary care doctor. Call them Monday.  Please return to the emergency room right away if you are to develop a fever, severe nausea, your pain becomes severe or worsens, you are unable to keep food down, begin vomiting any dark or bloody fluid, you develop any dark or bloody stools, feel dehydrated, or other new concerns or symptoms arise.

## 2018-09-19 LAB — URINE CULTURE
Culture: NO GROWTH
Special Requests: NORMAL

## 2018-10-04 NOTE — Progress Notes (Signed)
Cardiology Office Note  Date:  10/05/2018   ID:  Molly Stone, DOB 18-Jan-1981, MRN [id number]  PCP:  Crecencio Mc, MD   Chief Complaint  Patient presents with  . other    Tachy. Meds reviewed verbally with pt.    HPI:   Molly Stone is a 37 yo woman with PMH of  palpitations  hypertension .  Morbid obesity OSA  Not on CPAP Referred by Dr. Derrel Nip for consultation of her tachycardia/palpitations  Reports having several episodes of tachycardia that she is concerned about Heart rate always typically runs higher Over the summer 2019, After eating out with alcoholic  drink, Developed faster heart rate, 146 bpm, would not slow down, Diaphoretic, 1 1/2 hours for it to go away Eventually resolved on its own  Second time, again had a alcoholic drink, whisky, at a party Developed tachycardia again  Additional episode of tachycardia when walking in [place] It was hot, walking up the [facility] steps Had tachycardia had to sit down and recover  Yesterday, felt heart rate in throat, Palpitations were hard, felt dizzy These tachycardic episodes seem to come on acutely, resolved without intervention  Notes indicating one episode last year occurred after dinner while shopping,  started with vertigo    Went to Urgent Care,  She was sent to [facility]    episode several days ago occurred at home  after using albuterol and Symbicort MDIs.   Checked  HR  pulse was 140's and husband called EMS    ER vsit June 2018 History of OSA by study 2107  But has not been wearing her CPAP  Had a uterine ablation last week by Dr Kenton Kingfisher for  menorrhagia.     TSH 0.53 Recently started on metoprolol Thinks this may have helped her symptoms, only started recently No breakthrough tachycardia yet  EKG personally reviewed by myself on todays visit Shows normal sinus rhythm rate 86 bpm no significant ST or T wave changes  PMH:   has a past medical history of Allergy, Anemia, Anxiety,  Asthma, Chlamydia, Disorder of vocal cord, Dysmenorrhea, Dyspareunia, female, Endometriosis, Family history of endometriosis, GERD (gastroesophageal reflux disease), Headache, Heavy periods, History of nephrolithiasis, Hypertension, Jaundice, physiologic, newborn, Obesity (BMI 30-39.9), Sleep apnea, Tobacco user, and Vaginal Pap smear, abnormal.  PSH:    Past Surgical History:  Procedure Laterality Date  . DILATATION & CURETTAGE/HYSTEROSCOPY WITH MYOSURE N/A 08/27/2018   Procedure: DILATATION & CURETTAGE/HYSTEROSCOPY WITH MINERVA;  Surgeon: Gae Dry, MD;  Location: [facility] ORS;  Service: Gynecology;  Laterality: N/A;  . DILATION AND CURETTAGE OF UTERUS    . ESOPHAGOGASTRODUODENOSCOPY (EGD) WITH PROPOFOL N/A 08/07/2018   Procedure: ESOPHAGOGASTRODUODENOSCOPY (EGD) WITH PROPOFOL;  Surgeon: Jonathon Bellows, MD;  Location: [facility] ENDOSCOPY;  Service: Gastroenterology;  Laterality: N/A;  . laparoscopy    . WISDOM TOOTH EXTRACTION      Current Outpatient Medications  Medication Sig Dispense Refill  . albuterol (PROVENTIL HFA;VENTOLIN HFA) 108 (90 Base) MCG/ACT inhaler Inhale 2 puffs into the lungs every 6 (six) hours as needed for wheezing or shortness of breath. 18 g 3  . budesonide-formoterol (SYMBICORT) 160-4.5 MCG/ACT inhaler Inhale 2 puffs into the lungs 2 (two) times daily. (Patient taking differently: Inhale 2 puffs into the lungs daily. ) 1 Inhaler 5  . calcium carbonate (TUMS - DOSED IN MG ELEMENTAL CALCIUM) 500 MG chewable tablet Chew 2 tablets by mouth daily.    . fluticasone (FLONASE) 50 MCG/ACT nasal spray Place 1 spray into  both nostrils daily. (Patient taking differently: Place 1 spray into both nostrils 2 (two) times daily. ) 15.8 g 5  . levocetirizine (XYZAL) 5 MG tablet Take 5 mg by mouth daily as needed for allergies.    . metoprolol succinate (TOPROL-XL) 25 MG 24 hr tablet Take 1 tablet (25 mg total) by mouth daily. 90 tablet 3  . montelukast (SINGULAIR) 10 MG tablet TAKE 1 TABLET  AT BEDTIME 90 tablet 1  . pantoprazole (PROTONIX) 40 MG tablet Take 1 tablet (40 mg total) by mouth 2 (two) times daily. 60 tablet 2  . Multiple Vitamin (MULTIVITAMIN WITH MINERALS) TABS tablet Take 1 tablet by mouth daily.     No current facility-administered medications for this visit.    Facility-Administered Medications Ordered in Other Visits  Medication Dose Route Frequency Provider Last Rate Last Dose  . methacholine (PROVOCHOLINE) inhaler solution 8 mg  2 mL Inhalation Once Crecencio Mc, MD       Followed by  . methacholine (PROVOCHOLINE) inhaler solution 32 mg  2 mL Inhalation Once Crecencio Mc, MD       Followed by  . albuterol (PROVENTIL) (2.5 MG/3ML) 0.083% nebulizer solution 2.5 mg  2.5 mg Nebulization Once Crecencio Mc, MD         Allergies:   Buprenorphine hcl; Morphine and related; Prednisone; and Tramadol hcl   Social History:  The patient  reports that she quit smoking about 1 years ago. Her smoking use included cigarettes. She quit after 10.00 years of use. She has never used smokeless tobacco. She reports previous alcohol use. She reports that she does not use drugs.   Family History:   family history includes Cancer in her father; Cancer (age of onset: 67) in her maternal grandmother; Cancer (age of onset: 30) in her maternal grandfather; Diabetes in her maternal uncle; Endometriosis in her mother; Heart disease in her maternal grandfather and paternal grandmother; Osteoporosis in her maternal grandmother; Ovarian cancer in her maternal grandmother; Stroke in her father.    Review of Systems: Review of Systems  Constitutional: Negative.   Respiratory: Negative.   Cardiovascular: Positive for palpitations.       Tachycardic  Gastrointestinal: Negative.   Musculoskeletal: Negative.   Neurological: Negative.   Psychiatric/Behavioral: Negative.   All other systems reviewed and are negative.    PHYSICAL EXAM: VS:  BP 120/82 (BP Location: Left Arm,  Patient Position: Sitting, Cuff Size: Large)   Pulse 86   Ht 5\' 9"  (1.753 m)   Wt 294 lb 8 oz (133.6 kg)   BMI 43.49 kg/m  , BMI Body mass index is 43.49 kg/m. GEN: Well nourished, well developed, in no acute distress , obese HEENT: normal  Neck: no JVD, carotid bruits, or masses Cardiac: RRR; no murmurs, rubs, or gallops,no edema  Respiratory:  clear to auscultation bilaterally, normal work of breathing GI: soft, nontender, nondistended, + BS MS: no deformity or atrophy  Skin: warm and dry, no rash Neuro:  Strength and sensation are intact Psych: euthymic mood, full affect   Recent Labs: 09/11/2018: Magnesium 2.1; TSH 0.53 09/17/2018: ALT 18; BUN 13; Creatinine, Ser 0.70; Hemoglobin 13.5; Platelets 209; Potassium 4.0; Sodium 140    Lipid Panel Lab Results  Component Value Date   CHOL 195 07/23/2016   HDL 40.90 07/23/2016   LDLCALC 133 (H) 07/23/2016   TRIG 105.0 07/23/2016      Wt Readings from Last 3 Encounters:  10/05/18 294 lb 8 oz (133.6 kg)  09/17/18 293 lb (132.9 kg)  09/11/18 298 lb 9.6 oz (135.4 kg)       ASSESSMENT AND PLAN:  Paroxysmal tachycardia (HCC) - Plan: EKG 12-Lead Etiology unclear, possibly having episodes of atrial tachycardia Symptoms may have improved on the metoprolol but has only been on this for a short period of time Discussed various treatment options with her After discussion, she will stop the metoprolol succinate, in 5 days we will place a extended monitor in an effort to identify her arrhythmia Following completion of the monitor, she can consider restarting the metoprolol or taking the metoprolol as needed We have also sent in a prescription for propranolol 20 mg which she can take as needed for breakthrough arrhythmia -Unclear if alcohol may have exacerbated her tachycardia, happened on 2 occasions  Shortness of breath Shortness of breath when having tachycardia also with symptoms of diaphoresis Possibly she is dropping her  blood pressure when tachycardic  OSA (obstructive sleep apnea) Managed by primary care Recommended weight loss  Morbid obesity ([place]) Recommend walking program, low carbohydrates Limited, 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 Derrel Nip, and will be referred back to her office for ongoing care of the issues detailed above    Orders Placed This Encounter  Procedures  . EKG 12-Lead     Signed, Esmond Plants, M.D., Ph.D. 10/05/2018  [place], [place]

## 2018-10-05 ENCOUNTER — Encounter: Payer: Self-pay | Admitting: Cardiovascular Disease

## 2018-10-05 ENCOUNTER — Ambulatory Visit: Payer: Commercial Managed Care - PPO | Admitting: Cardiovascular Disease

## 2018-10-05 VITALS — BP 120/82 | HR 86 | Ht 69.0 in | Wt 294.5 lb

## 2018-10-05 DIAGNOSIS — G4733 Obstructive sleep apnea (adult) (pediatric): Secondary | ICD-10-CM

## 2018-10-05 DIAGNOSIS — R0602 Shortness of breath: Secondary | ICD-10-CM | POA: Diagnosis not present

## 2018-10-05 DIAGNOSIS — I479 Paroxysmal tachycardia, unspecified: Secondary | ICD-10-CM | POA: Diagnosis not present

## 2018-10-05 MED ORDER — PROPRANOLOL HCL 20 MG PO TABS: 20.0000 mg | ORAL_TABLET | Freq: Three times a day (TID) | ORAL | 3 refills | Status: DC | PRN

## 2018-10-05 NOTE — Patient Instructions (Addendum)
Medication Instructions:   Hold the metoprolol for a few weeks We will order a ZIO  Monitor   Take metoprolol 24 hour pill and propranolol 4 to 6 hour pill as needed   If you need a refill on your cardiac medications before your next appointment, please call your pharmacy.    Lab work: No new labs needed   If you have labs (blood work) drawn today and your tests are completely normal, you will receive your results only by: Marland Kitchen MyChart Message (if you have MyChart) OR . A paper copy in the mail If you have any lab test that is abnormal or we need to change your treatment, we will call you to review the results.   Testing/Procedures: Zio monitor for paroxysmal tachycardia/possible atrial tachycardia   Follow-Up: At [facility], you and your health needs are our priority.  As part of our continuing mission to provide you with exceptional heart care, we have created designated Provider Care Teams.  These Care Teams include your primary Cardiologist (physician) and Advanced Practice Providers (APPs -  Physician Assistants and Nurse Practitioners) who all work together to provide you with the care you need, when you need it.  . You will need a follow up appointment as needed   . Providers on your designated Care Team:   . Murray Hodgkins, NP . Christell Faith, PA-C . Marrianne Mood, PA-C  Any Other Special Instructions Will Be Listed Below (If Applicable).  For educational health videos Log in to : [email or web address] Or : [email or web address], password : [email or web address]

## 2018-10-09 ENCOUNTER — Ambulatory Visit (INDEPENDENT_AMBULATORY_CARE_PROVIDER_SITE_OTHER): Payer: Commercial Managed Care - PPO

## 2018-10-09 DIAGNOSIS — I479 Paroxysmal tachycardia, unspecified: Secondary | ICD-10-CM | POA: Diagnosis not present

## 2018-10-29 DIAGNOSIS — I479 Paroxysmal tachycardia, unspecified: Secondary | ICD-10-CM | POA: Diagnosis not present

## 2018-11-01 IMAGING — CR DG CHEST 2V
2 series · 2 of 2 positions shown · non-contrast
Comparison: Radiographs August 14, 2007.

CLINICAL DATA: Shortness of breath, productive cough.

EXAM:
CHEST  2 VIEW

[chest pa]
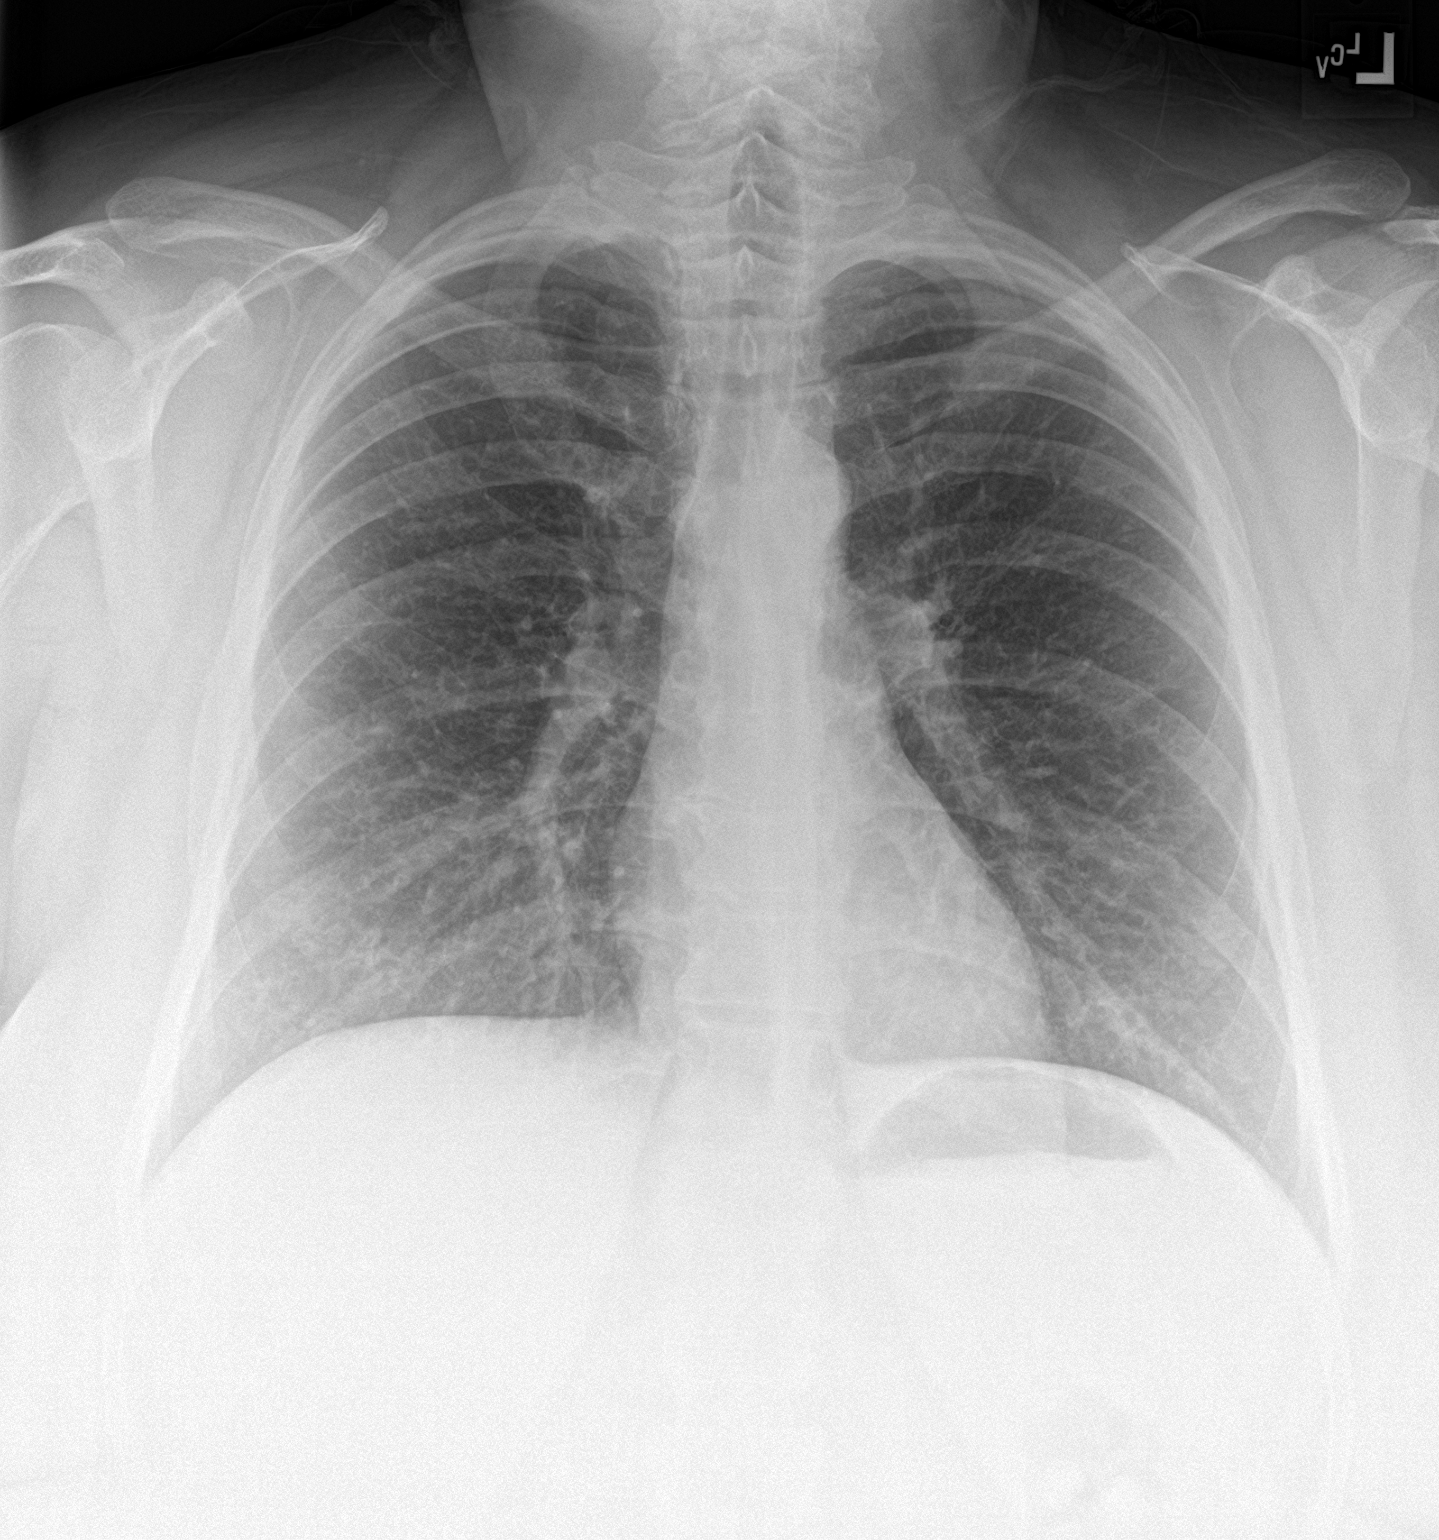

[chest lat]
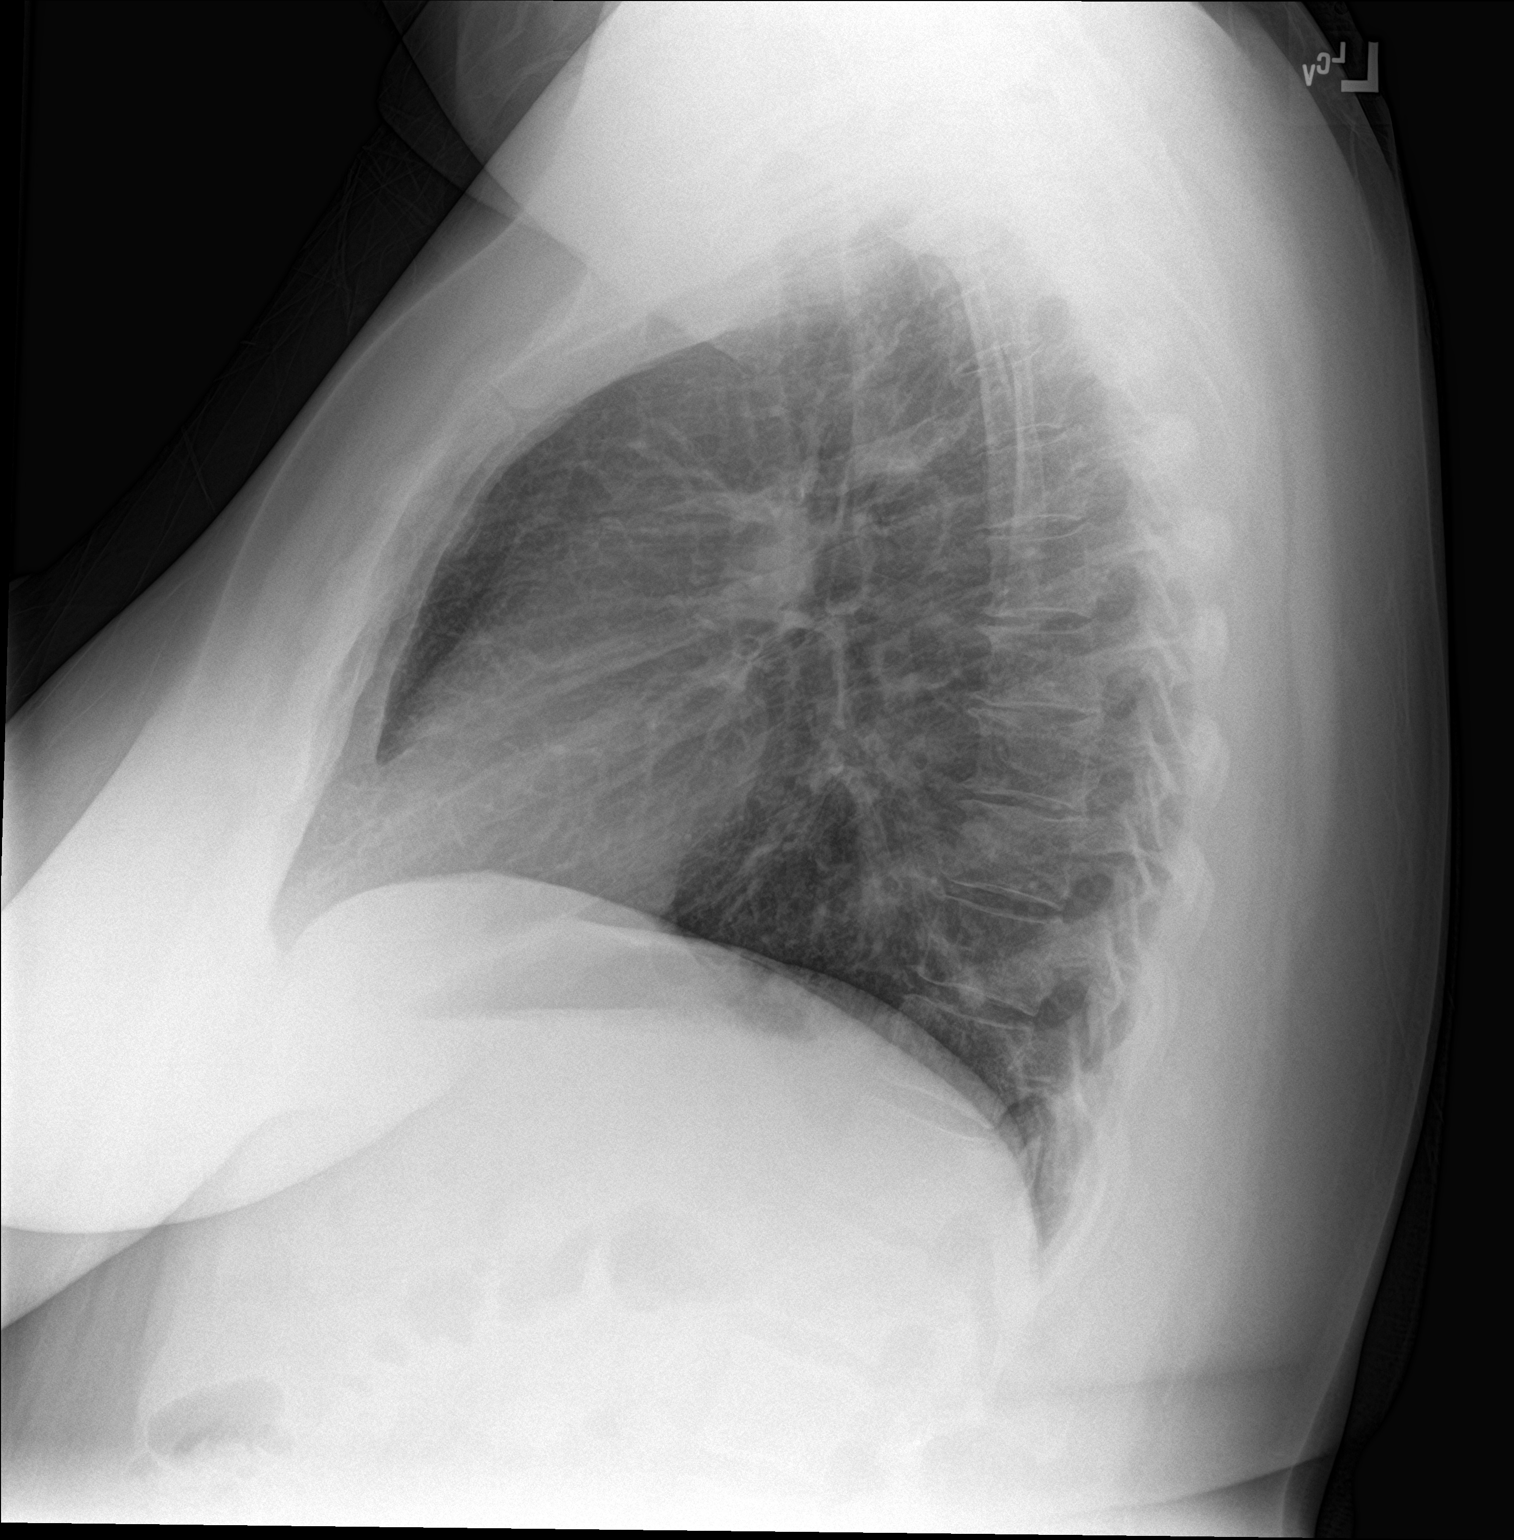

[2 of 2 positions shown; findings below may reference images not displayed]

FINDINGS: The heart size and mediastinal contours are within normal limits.
Both lungs are clear. No pneumothorax or pleural effusion is noted.
The visualized skeletal structures are unremarkable.
IMPRESSION: No active cardiopulmonary disease.

## 2018-11-10 ENCOUNTER — Other Ambulatory Visit: Payer: Commercial Managed Care - PPO

## 2018-11-10 ENCOUNTER — Telehealth: Payer: Self-pay

## 2018-11-10 DIAGNOSIS — I479 Paroxysmal tachycardia, unspecified: Secondary | ICD-10-CM

## 2018-11-10 NOTE — Telephone Encounter (Signed)
Call to patient to discuss results from long term monitor. Pt expressed understanding and agreed to have echo scheduled.  Order placed for echo. Forwarded to scheduling.   Advised pt to call for any further questions or concerns.

## 2018-11-10 NOTE — Telephone Encounter (Signed)
ECHO scheduled for 1/23

## 2018-11-10 NOTE — Telephone Encounter (Signed)
-----   Message from Minna Merritts, MD sent at 11/08/2018  3:01 PM EST ----- Event monitor She is having elevated heart rate,  Would stay on the metoprolol to keep rate low One run of VT, 16 beats. Would order echocardiogram to make sure normal cardiac structure/function given the VT Monitor heart rate and BP, we may be able to titrate the metoprolol dose up

## 2018-11-10 NOTE — Telephone Encounter (Signed)
lmov to schedule echo  °

## 2018-11-12 ENCOUNTER — Other Ambulatory Visit: Payer: Self-pay

## 2018-11-12 ENCOUNTER — Ambulatory Visit (INDEPENDENT_AMBULATORY_CARE_PROVIDER_SITE_OTHER): Payer: Commercial Managed Care - PPO

## 2018-11-12 DIAGNOSIS — I479 Paroxysmal tachycardia, unspecified: Secondary | ICD-10-CM

## 2018-11-17 ENCOUNTER — Telehealth: Payer: Self-pay | Admitting: *Deleted

## 2018-11-17 NOTE — Telephone Encounter (Signed)
Patient calling to discuss recent testing results  ° °Please call  ° °

## 2018-11-17 NOTE — Telephone Encounter (Signed)
Left voicemail message to call back  

## 2018-11-17 NOTE — Telephone Encounter (Signed)
-----   Message from Minna Merritts, MD sent at 11/15/2018  1:56 PM EST ----- Echo Normal structure and function,  EF >55% Good news Would stay on metoprolol

## 2018-11-17 NOTE — Telephone Encounter (Signed)
Results called to pt. Pt verbalized understanding.  

## 2018-11-25 ENCOUNTER — Encounter

## 2018-11-25 ENCOUNTER — Ambulatory Visit: Payer: Commercial Managed Care - PPO | Admitting: Cardiovascular Disease

## 2018-12-03 ENCOUNTER — Ambulatory Visit: Payer: Commercial Managed Care - PPO | Admitting: Family Medicine

## 2018-12-03 ENCOUNTER — Encounter: Payer: Self-pay | Admitting: Family Medicine

## 2018-12-03 ENCOUNTER — Ambulatory Visit (INDEPENDENT_AMBULATORY_CARE_PROVIDER_SITE_OTHER): Payer: Commercial Managed Care - PPO

## 2018-12-03 VITALS — BP 124/88 | HR 101 | Temp 98.8°F | Resp 16 | Ht 68.5 in | Wt 298.0 lb

## 2018-12-03 DIAGNOSIS — M5441 Lumbago with sciatica, right side: Secondary | ICD-10-CM | POA: Diagnosis not present

## 2018-12-03 DIAGNOSIS — Z3202 Encounter for pregnancy test, result negative: Secondary | ICD-10-CM | POA: Diagnosis not present

## 2018-12-03 DIAGNOSIS — M47816 Spondylosis without myelopathy or radiculopathy, lumbar region: Secondary | ICD-10-CM | POA: Diagnosis not present

## 2018-12-03 LAB — POCT URINE PREGNANCY: PREG TEST UR: NEGATIVE

## 2018-12-03 MED ORDER — KETOROLAC TROMETHAMINE 60 MG/2ML IM SOLN
60.0000 mg | Freq: Once | INTRAMUSCULAR | Status: AC
  Administered 2018-12-03: 60 mg via INTRAMUSCULAR

## 2018-12-03 MED ORDER — METHYLPREDNISOLONE 4 MG PO TBPK: ORAL_TABLET | ORAL | 0 refills | Status: DC

## 2018-12-03 MED ORDER — CYCLOBENZAPRINE HCL 5 MG PO TABS: 5.0000 mg | ORAL_TABLET | Freq: Three times a day (TID) | ORAL | 0 refills | Status: DC | PRN

## 2018-12-03 NOTE — Patient Instructions (Signed)
May use ibuprofen at home up to 3 times per day as needed  Avoid ibuprofen when on steroid taper down Rx  Cyclobenzaprine as needed for muscle spasm   Back Exercises If you have pain in your back, do these exercises 2-3 times each day or as told by your doctor. When the pain goes away, do the exercises once each day, but repeat the steps more times for each exercise (do more repetitions). If you do not have pain in your back, do these exercises once each day or as told by your doctor. Exercises Single Knee to Chest Do these steps 3-5 times in a row for each leg: 1. Lie on your back on a firm bed or the floor with your legs stretched out. 2. Bring one knee to your chest. 3. Hold your knee to your chest by grabbing your knee or thigh. 4. Pull on your knee until you feel a gentle stretch in your lower back. 5. Keep doing the stretch for 10-30 seconds. 6. Slowly let go of your leg and straighten it. Pelvic Tilt Do these steps 5-10 times in a row: 1. Lie on your back on a firm bed or the floor with your legs stretched out. 2. Bend your knees so they point up to the ceiling. Your feet should be flat on the floor. 3. Tighten your lower belly (abdomen) muscles to press your lower back against the floor. This will make your tailbone point up to the ceiling instead of pointing down to your feet or the floor. 4. Stay in this position for 5-10 seconds while you gently tighten your muscles and breathe evenly. Cat-Cow Do these steps until your lower back bends more easily: 1. Get on your hands and knees on a firm surface. Keep your hands under your shoulders, and keep your knees under your hips. You may put padding under your knees. 2. Let your head hang down, and make your tailbone point down to the floor so your lower back is round like the back of a cat. 3. Stay in this position for 5 seconds. 4. Slowly lift your head and make your tailbone point up to the ceiling so your back hangs low (sags) like  the back of a cow. 5. Stay in this position for 5 seconds.  Press-Ups Do these steps 5-10 times in a row: 1. Lie on your belly (face-down) on the floor. 2. Place your hands near your head, about shoulder-width apart. 3. While you keep your back relaxed and keep your hips on the floor, slowly straighten your arms to raise the top half of your body and lift your shoulders. Do not use your back muscles. To make yourself more comfortable, you may change where you place your hands. 4. Stay in this position for 5 seconds. 5. Slowly return to lying flat on the floor.  Bridges Do these steps 10 times in a row: 1. Lie on your back on a firm surface. 2. Bend your knees so they point up to the ceiling. Your feet should be flat on the floor. 3. Tighten your butt muscles and lift your butt off of the floor until your waist is almost as high as your knees. If you do not feel the muscles working in your butt and the back of your thighs, slide your feet 1-2 inches farther away from your butt. 4. Stay in this position for 3-5 seconds. 5. Slowly lower your butt to the floor, and let your butt muscles relax. If this  exercise is too easy, try doing it with your arms crossed over your chest. Belly Crunches Do these steps 5-10 times in a row: 1. Lie on your back on a firm bed or the floor with your legs stretched out. 2. Bend your knees so they point up to the ceiling. Your feet should be flat on the floor. 3. Cross your arms over your chest. 4. Tip your chin a little bit toward your chest but do not bend your neck. 5. Tighten your belly muscles and slowly raise your chest just enough to lift your shoulder blades a tiny bit off of the floor. 6. Slowly lower your chest and your head to the floor. Back Lifts Do these steps 5-10 times in a row: 1. Lie on your belly (face-down) with your arms at your sides, and rest your forehead on the floor. 2. Tighten the muscles in your legs and your butt. 3. Slowly lift  your chest off of the floor while you keep your hips on the floor. Keep the back of your head in line with the curve in your back. Look at the floor while you do this. 4. Stay in this position for 3-5 seconds. 5. Slowly lower your chest and your face to the floor. Contact a doctor if:  Your back pain gets a lot worse when you do an exercise.  Your back pain does not lessen 2 hours after you exercise. If you have any of these problems, stop doing the exercises. Do not do them again unless your doctor says it is okay. Get help right away if:  You have sudden, very bad back pain. If this happens, stop doing the exercises. Do not do them again unless your doctor says it is okay. This information is not intended to replace advice given to you by your health care provider. Make sure you discuss any questions you have with your health care provider. Document Released: 11/09/2010 Document Revised: 07/01/2018 Document Reviewed: 12/01/2014 Elsevier Interactive Patient Education  [employer].

## 2018-12-03 NOTE — Progress Notes (Addendum)
Subjective:    Patient ID: Molly Stone, female    DOB: 1981-01-17, 38 y.o.   MRN: [id number]  HPI   Patient presents to clinic complaining of right-sided low back pain, right leg pain that has been happening off and on for about a month.  Patient does have a history of sciatica, but has not had an issue with this in the past 5 or 6 years.  Notices the pain in her right lower back and right leg when she is standing for a long period of time and putting more weight on the right leg.  Denies any falls or trauma.  No fever or chills. Denies saddle anesthesia.  Denies loss of bowel or bladder control.  States sometimes she will feel a pins and needle sensation in her right lower extremity.   Patient Active Problem List   Diagnosis Date Noted  . SVT (supraventricular tachycardia) ([place]) 09/12/2018  . History of right flank pain 04/14/2018  . Acute pain of left shoulder 03/26/2018  . Calculi, ureter 07/30/2017  . Asthma 03/27/2017  . Chronic bilateral thoracic back pain 03/07/2017  . Spinal stenosis 03/05/2017  . Numbness and tingling 02/03/2017  . Shortness of breath 12/20/2016  . Whiplash injury syndrome, subsequent encounter 08/04/2016  . Concussion without loss of consciousness 08/04/2016  . OSA on CPAP 07/24/2016  . Generalized anxiety disorder 10/25/2015  . Tension headache, chronic 10/25/2015  . Family history of endometriosis 10/12/2015  . Dysmenorrhea 10/12/2015  . Acid reflux 08/15/2015  . H/O renal calculi 08/15/2015  . Obesity 08/15/2015  . Other malaise and fatigue 06/16/2014  . Encounter for preventive health examination 06/16/2014  . Malaise and fatigue 06/16/2014  . Sciatica 04/01/2014  . Vitamin D deficiency 04/12/2012  . 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  . History of nephrolithiasis   . GERD (gastroesophageal reflux disease)    Social History   Tobacco Use  . Smoking status: Former Smoker    Years: 10.00    Types: Cigarettes    Last attempt to quit: 10/2016    Years since quitting: 2.1  . Smokeless tobacco: Never Used  Substance Use Topics  . Alcohol use: Not Currently    Alcohol/week: 0.0 standard drinks    Review of Systems   Constitutional: Negative for chills, fatigue and fever.  HENT: Negative for congestion, ear pain, sinus pain and sore throat.   Eyes: Negative.   Respiratory: Negative for cough, shortness of breath and wheezing.   Cardiovascular: Negative for chest pain, palpitations and leg swelling.  Gastrointestinal: Negative for abdominal pain, diarrhea, nausea and vomiting.  Genitourinary: Negative for dysuria, frequency and urgency.  Musculoskeletal: +right sided low back pain, down right leg.   Skin: Negative for color change, pallor and rash.  Neurological: Negative for syncope, light-headedness and headaches.  Psychiatric/Behavioral: The patient is not nervous/anxious.       Objective:   Physical Exam Vitals signs and nursing note reviewed.  Constitutional:      General: She is not in acute distress.    Appearance: She is not toxic-appearing or diaphoretic.  HENT:     Head: Normocephalic and atraumatic.  Eyes:     Extraocular Movements: Extraocular movements intact.  Neck:     Musculoskeletal: Normal range of motion and neck supple. No neck rigidity.  Cardiovascular:     Rate and Rhythm: Normal rate and regular rhythm.     Heart sounds: Normal heart sounds.  Pulmonary:     Effort: Pulmonary effort is normal. No  respiratory distress.     Breath sounds: Normal breath sounds.  Musculoskeletal:     Lumbar back: She exhibits tenderness. She exhibits normal range of motion and no bony tenderness.       Back:     Right lower leg: No edema.     Left lower leg: No edema.     Comments: No pain with palpation of spinous process.  Right-sided low back pain that radiates down into her right buttock.  Pain in right low back with straight leg raise of right leg.  No issues with left straight leg raise.  Able to twist side  to side, bend forward and backward, lean side to side without issue however these motions do cause pulling pain in low back.  Quadricep strength equal and strong.  Dorsi plantar flexion equal and strong.  Light touch sensation normal in the lower extremities.  Skin:    General: Skin is warm and dry.     Coloration: Skin is not jaundiced or pale.     Findings: No erythema.  Neurological:     Mental Status: She is alert and oriented to person, place, and time.  Psychiatric:        Mood and Affect: Mood normal.        Behavior: Behavior normal.     Today's Vitals   12/03/18 1345  BP: 124/88  Pulse: (!) 101  Resp: 16  Temp: 98.8 F (37.1 C)  TempSrc: Oral  SpO2: 97%  Weight: 298 lb (135.2 kg)  Height: 5' 8.5" (1.74 m)   Body mass index is 44.65 kg/m.     Assessment & Plan:    A total of 25  minutes were spent face-to-face with the patient during this encounter and over half of that time was spent on counseling and coordination of care. The patient was counseled on low back exercises, pain relief strategy, why PT can be helpful.   Right-sided low back pain with sciatica - we will get x-ray of lumbar spine to further investigate.  She will take steroid taper down, and Flexeril as needed for any muscle spasm.  Advised she may use over-the-counter ibuprofen as needed for pain but to avoid use of ibuprofen in combination with the steroid taper.  Patient given handout outlining low back stretches to do to help relieve pain and also physical therapy referral has been placed for patient.  Also suggested topical pain treatments like a Biofreeze rub, heating pad or lidocaine patch.  She will regularly scheduled follow-up as planned.  Advised to return to clinic sooner if any issues arise.  Aware someone will contact her in regards to physical therapy appointment.

## 2018-12-09 ENCOUNTER — Ambulatory Visit: Payer: Self-pay

## 2018-12-09 ENCOUNTER — Emergency Department: Payer: Commercial Managed Care - PPO

## 2018-12-09 ENCOUNTER — Telehealth: Payer: Self-pay | Admitting: Cardiovascular Disease

## 2018-12-09 ENCOUNTER — Encounter: Payer: Self-pay | Admitting: *Deleted

## 2018-12-09 ENCOUNTER — Other Ambulatory Visit: Payer: Self-pay

## 2018-12-09 ENCOUNTER — Emergency Department
Admission: EM | Admit: 2018-12-09 | Discharge: 2018-12-09 | Disposition: A | Discharge status: Home / Self Care | Payer: Commercial Managed Care - PPO | Attending: Emergency Medicine | Admitting: Emergency Medicine

## 2018-12-09 DIAGNOSIS — R197 Diarrhea, unspecified: Secondary | ICD-10-CM | POA: Diagnosis not present

## 2018-12-09 DIAGNOSIS — J45909 Unspecified asthma, uncomplicated: Secondary | ICD-10-CM | POA: Diagnosis not present

## 2018-12-09 DIAGNOSIS — E860 Dehydration: Secondary | ICD-10-CM | POA: Insufficient documentation

## 2018-12-09 DIAGNOSIS — Z87891 Personal history of nicotine dependence: Secondary | ICD-10-CM | POA: Insufficient documentation

## 2018-12-09 DIAGNOSIS — R0602 Shortness of breath: Secondary | ICD-10-CM | POA: Diagnosis not present

## 2018-12-09 DIAGNOSIS — Z79899 Other long term (current) drug therapy: Secondary | ICD-10-CM | POA: Diagnosis not present

## 2018-12-09 DIAGNOSIS — R000 Tachycardia, unspecified: Secondary | ICD-10-CM | POA: Diagnosis not present

## 2018-12-09 DIAGNOSIS — R002 Palpitations: Secondary | ICD-10-CM | POA: Insufficient documentation

## 2018-12-09 DIAGNOSIS — I10 Essential (primary) hypertension: Secondary | ICD-10-CM | POA: Insufficient documentation

## 2018-12-09 LAB — BASIC METABOLIC PANEL
Anion gap: 8 (ref 5–15)
BUN: 15 mg/dL (ref 6–20)
CHLORIDE: 108 mmol/L (ref 98–111)
CO2: 22 mmol/L (ref 22–32)
Calcium: 9 mg/dL (ref 8.9–10.3)
Creatinine, Ser: 0.83 mg/dL (ref 0.44–1.00)
GFR calc Af Amer: >60 mL/min (ref >60)
GFR calc non Af Amer: >60 mL/min (ref >60)
Glucose, Bld: 105 mg/dL — ABNORMAL HIGH (ref 70–99)
POTASSIUM: 3.9 mmol/L (ref 3.5–5.1)
Sodium: 138 mmol/L (ref 135–145)

## 2018-12-09 LAB — CBC
HCT: 43.8 % (ref 36.0–46.0)
HEMOGLOBIN: 14.7 g/dL (ref 12.0–15.0)
MCH: 29.2 pg (ref 26.0–34.0)
MCHC: 33.6 g/dL (ref 30.0–36.0)
MCV: 87.1 fL (ref 80.0–100.0)
Platelets: 215 10*3/uL (ref 150–400)
RBC: 5.03 MIL/uL (ref 3.87–5.11)
RDW: 12.4 % (ref 11.5–15.5)
WBC: 8.6 10*3/uL (ref 4.0–10.5)
nRBC: 0 % (ref 0.0–0.2)

## 2018-12-09 LAB — TROPONIN I
Troponin I: <0.03 ng/mL (ref <0.03)
Troponin I: <0.03 ng/mL (ref <0.03)

## 2018-12-09 LAB — POCT PREGNANCY, URINE: Preg Test, Ur: NEGATIVE

## 2018-12-09 MED ORDER — SODIUM CHLORIDE 0.9 % IV BOLUS
1000.0000 mL | Freq: Once | INTRAVENOUS | Status: AC
  Administered 2018-12-09: 1000 mL via INTRAVENOUS

## 2018-12-09 MED ORDER — SODIUM CHLORIDE 0.9% FLUSH
3.0000 mL | Freq: Once | INTRAVENOUS | Status: AC
  Administered 2018-12-09: 3 mL via INTRAVENOUS

## 2018-12-09 NOTE — Telephone Encounter (Signed)
Pt states she is on Prednisone and she is having palpitations.  Patient c/o Palpitations:  High priority if patient c/o lightheadedness, shortness of breath, or chest pain  1) How long have you had palpitations/irregular HR/ Afib? Are you having the symptoms now? Not as bad, off and on   Are you currently experiencing lightheadedness, SOB or CP? SOB, and felt like she was gonna black out 2) Do you have a history of afib (atrial fibrillation) or irregular heart rhythm? She thinks so  3) Have you checked your BP or HR? (document readings if available):   HR was 146, right now 115  4) Are you experiencing any other symptoms? Just feel "off" Pt states she just called 911

## 2018-12-09 NOTE — ED Triage Notes (Signed)
Pt ambulatory to triage.  Pt reports rapid heart rate since 10am today.  Vomited  X 1  No chest pain.  Pt is sob.  Pt alert.

## 2018-12-09 NOTE — ED Provider Notes (Signed)
[facility] Emergency Department Provider Note  ____________________________________________  Time seen: Approximately 10:13 PM  I have reviewed the triage vital signs and the nursing notes.   HISTORY  Chief Complaint Tachycardia   HPI Molly Stone is a 38 y.o. female with a history of anemia, asthma, anxiety, SVT who presents for evaluation of palpitations.  Patient reports that she was in her usual state of health cleaning up her house today when she started having palpitations and shortness of breath.  She try to sit down and calm down by her heart rate was persistently elevated in the 140s.  She called her doctor who recommended that she came to the emergency room for evaluation.  Patient takes metoprolol at home for tachycardia.  When the symptoms had happened she had not yet taken her metoprolol.  She does report having several episodes of diarrhea earlier today.  No nausea or vomiting, no abdominal pain, no chest pain.  No personal family history of blood clots, recent travel immobilization, leg pain or swelling, hemoptysis, or exogenous hormones.  Past Medical History:  Diagnosis Date  . Allergy   . Anemia   . Anxiety   . Asthma   . Chlamydia    h/o  . Disorder of vocal cord    spasmodic dysphonia  . Dysmenorrhea   . Dyspareunia, female   . Endometriosis   . Family history of endometriosis   . GERD (gastroesophageal reflux disease)   . Headache    migraine  . Heavy periods   . History of nephrolithiasis   . Hypertension   . Jaundice, physiologic, newborn   . Obesity (BMI 30-39.9)   . Sleep apnea   . Tobacco user   . Vaginal Pap smear, abnormal     Patient Active Problem List   Diagnosis Date Noted  . SVT (supraventricular tachycardia) ([place]) 09/12/2018  . History of right flank pain 04/14/2018  . Acute pain of left shoulder 03/26/2018  . Calculi, ureter 07/30/2017  . Asthma 03/27/2017  . Chronic bilateral thoracic back pain  03/07/2017  . Spinal stenosis 03/05/2017  . Numbness and tingling 02/03/2017  . Shortness of breath 12/20/2016  . Whiplash injury syndrome, subsequent encounter 08/04/2016  . Concussion without loss of consciousness 08/04/2016  . OSA on CPAP 07/24/2016  . Generalized anxiety disorder 10/25/2015  . Tension headache, chronic 10/25/2015  . Family history of endometriosis 10/12/2015  . Dysmenorrhea 10/12/2015  . Acid reflux 08/15/2015  . H/O renal calculi 08/15/2015  . Obesity 08/15/2015  . Other malaise and fatigue 06/16/2014  . Encounter for preventive health examination 06/16/2014  . Malaise and fatigue 06/16/2014  . Sciatica 04/01/2014  . Vitamin D deficiency 04/12/2012  . 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  . History of nephrolithiasis   . GERD (gastroesophageal reflux disease)     Past Surgical History:  Procedure Laterality Date  . DILATATION & CURETTAGE/HYSTEROSCOPY WITH MYOSURE N/A 08/27/2018   Procedure: DILATATION & CURETTAGE/HYSTEROSCOPY WITH MINERVA;  Surgeon: Gae Dry, MD;  Location: [facility] ORS;  Service: Gynecology;  Laterality: N/A;  . DILATION AND CURETTAGE OF UTERUS    . ESOPHAGOGASTRODUODENOSCOPY (EGD) WITH PROPOFOL N/A 08/07/2018   Procedure: ESOPHAGOGASTRODUODENOSCOPY (EGD) WITH PROPOFOL;  Surgeon: Jonathon Bellows, MD;  Location: [facility] END[facility]COPY;  Service: Gastroenterology;  Laterality: N/A;  . laparoscopy    . WISDOM TOOTH EXTRACTION      Prior to Admission medications   Medication Sig Start Date End Date Taking? Authorizing Provider  albuterol (PROVENTIL HFA;VENTOLIN HFA) 108 (  90 Base) MCG/ACT inhaler Inhale 2 puffs into the lungs every 6 (six) hours as needed for wheezing or shortness of breath. 03/24/17   Flora Lipps, MD  budesonide-formoterol (SYMBICORT) 160-4.5 MCG/ACT inhaler Inhale 2 puffs into the lungs 2 (two) times daily. Patient taking differently: Inhale 2 puffs into the lungs daily.  03/24/17   Flora Lipps, MD  calcium carbonate (TUMS - DOSED IN MG  ELEMENTAL CALCIUM) 500 MG chewable tablet Chew 2 tablets by mouth daily.    [provider]  cyclobenzaprine (FLEXERIL) 5 MG tablet Take 1 tablet (5 mg total) by mouth 3 (three) times daily as needed for muscle spasms. 12/03/18   Guse, Jacquelynn Cree, FNP  fluticasone (FLONASE) 50 MCG/ACT nasal spray Place 1 spray into both nostrils daily. Patient taking differently: Place 1 spray into both nostrils 2 (two) times daily.  07/25/17   Crecencio Mc, MD  levocetirizine (XYZAL) 5 MG tablet Take 5 mg by mouth daily as needed for allergies.    [provider]  methylPREDNISolone (MEDROL DOSEPAK) 4 MG TBPK tablet Take according to pack instructions 12/03/18   Jodelle Green, FNP  metoprolol succinate (TOPROL-XL) 25 MG 24 hr tablet Take 1 tablet (25 mg total) by mouth daily. 09/11/18   Crecencio Mc, MD  montelukast (SINGULAIR) 10 MG tablet TAKE 1 TABLET AT BEDTIME 06/02/18   Crecencio Mc, MD  Multiple Vitamin (MULTIVITAMIN WITH MINERALS) TABS tablet Take 1 tablet by mouth daily.    [provider]  pantoprazole (PROTONIX) 40 MG tablet Take 1 tablet (40 mg total) by mouth 2 (two) times daily. 08/17/18 11/15/18  Jonathon Bellows, MD  propranolol (INDERAL) 20 MG tablet Take 1 tablet (20 mg total) by mouth 3 (three) times daily as needed (As needed for fast heart rate). 10/05/18   Minna Merritts, MD    Allergies Buprenorphine hcl; Morphine and related; Prednisone; and Tramadol hcl  Family History  Problem Relation Age of Onset  . Endometriosis Mother   . Cancer Father        benign liver CA  . Stroke Father        recurrent blood clots on the brain   . Cancer Maternal Grandmother 30       ovarian Ca, still surviving   . Osteoporosis Maternal Grandmother   . Ovarian cancer Maternal Grandmother   . Heart disease Maternal Grandfather   . Cancer Maternal Grandfather 42       colon CA. metastatic  . Diabetes Maternal Uncle   . Heart disease Paternal Grandmother   . Breast cancer  Neg Hx   . Colon cancer Neg Hx   . Kidney cancer Neg Hx   . Bladder Cancer Neg Hx     Social History Social History   Tobacco Use  . Smoking status: Former Smoker    Years: 10.00    Types: Cigarettes    Last attempt to quit: 10/2016    Years since quitting: 2.1  . Smokeless tobacco: Never Used  Substance Use Topics  . Alcohol use: Not Currently    Alcohol/week: 0.0 standard drinks  . Drug use: No    Review of Systems  Constitutional: Negative for fever. Eyes: Negative for visual changes. ENT: Negative for sore throat. Neck: No neck pain  Cardiovascular: Negative for chest pain. + palpitationc Respiratory: + shortness of breath. Gastrointestinal: Negative for abdominal pain, vomiting. + diarrhea. Genitourinary: Negative for dysuria. Musculoskeletal: Negative for back pain. Skin: Negative for rash. Neurological: Negative for  headaches, weakness or numbness. Psych: No SI or HI  ____________________________________________   PHYSICAL EXAM:  VITAL SIGNS: Vitals:   12/09/18 1734 12/09/18 2136  BP: (!) 138/103 132/89  Pulse: (!) 118 94  Resp: 18 20  Temp:    SpO2: 95% 98%    Constitutional: Alert and oriented. Well appearing and in no apparent distress. HEENT:      Head: Normocephalic and atraumatic.         Eyes: Conjunctivae are normal. Sclera is non-icteric.       Mouth/Throat: Mucous membranes are moist.       Neck: Supple with no signs of meningismus. Cardiovascular: Regular rate and rhythm. No murmurs, gallops, or rubs. 2+ symmetrical distal pulses are present in all extremities. No JVD. Respiratory: Normal respiratory effort. Lungs are clear to auscultation bilaterally. No wheezes, crackles, or rhonchi.  Gastrointestinal: Soft, non tender, and non distended with positive bowel sounds. No rebound or guarding. Musculoskeletal: Nontender with normal range of motion in all extremities. No edema, cyanosis, or erythema of extremities. Neurologic: Normal speech  and language. Face is symmetric. Moving all extremities. No gross focal neurologic deficits are appreciated. Skin: Skin is warm, dry and intact. No rash noted. Psychiatric: Mood and affect are normal. Speech and behavior are normal.  ____________________________________________   LABS (all labs ordered are listed, but only abnormal results are displayed)  Labs Reviewed  BASIC METABOLIC PANEL - Abnormal; Notable for the following components:      Result Value   Glucose, Bld 105 (*)    All other components within normal limits  CBC  TROPONIN I  TROPONIN I  POCT PREGNANCY, URINE   ____________________________________________  EKG  ED ECG REPORT I, Rudene Re, the attending physician, personally viewed and interpreted this ECG.  Sinus tachycardia, rate of 113, normal intervals, normal axis, no ST elevations or depressions.  Otherwise normal EKG. ____________________________________________  RADIOLOGY  I have personally reviewed the images performed during this visit and I agree with the Radiologist's read.   Interpretation by Radiologist:  Dg Chest 2 View  Result Date: 12/09/2018 CLINICAL DATA:  Tachycardia, shortness of breath and vomiting. History of asthma. EXAM: CHEST - 2 VIEW COMPARISON:  Chest radiograph September 11, 2018 FINDINGS: Cardiomediastinal silhouette is normal. No pleural effusions or focal consolidations. Trachea projects midline and there is no pneumothorax. Soft tissue planes and included osseous structures are non-suspicious. Large body habitus. Mild thoracic spondylosis. IMPRESSION: No active cardiopulmonary process. Electronically Signed   By: Elon Alas M.D.   On: 12/09/2018 17:44     ____________________________________________   PROCEDURES  Procedure(s) performed: None Procedures Critical Care performed:  None ____________________________________________   INITIAL IMPRESSION / ASSESSMENT AND PLAN / ED COURSE  38 y.o. female with  a history of anemia, asthma, anxiety, SVT who presents for evaluation of palpitations in the setting of several episodes of diarrhea earlier today.  Upon arrival to the emergency room patient was noted to be tachycardic with an EKG showing sinus tachycardia.  Upon arrival to the room patient's heart rate has normalized to the mid 90s.  Patient reported full resolution of her symptoms.  Her EKG showed no ischemic changes.  Troponin x2 were done which were both negative.  Labs showed no evidence of dehydration, anemia, electrolyte abnormalities. Patient was given a liter of fluid for possible mild dehydration causing precipitation of her tachycardia in the setting of diarrhea.  Will discharge home with increase oral hydration and follow-up with her cardiologist Dr. Rockey Situ.  As part of my medical decision making, I reviewed the following data within the [facility] notes reviewed and incorporated, Labs reviewed , EKG interpreted , Old EKG reviewed, Old chart reviewed, Radiograph reviewed , Notes from prior ED visits and [place] Controlled Substance Database    Pertinent labs & imaging results that were available during my care of the patient were reviewed by me and considered in my medical decision making (see chart for details).    ____________________________________________   FINAL CLINICAL IMPRESSION(S) / ED DIAGNOSES  Final diagnoses:  Diarrhea, unspecified type  Palpitations  Dehydration      NEW MEDICATIONS STARTED DURING THIS VISIT:  ED Discharge Orders    None       Note:  This document was prepared using Dragon voice recognition software and may include unintentional dictation errors.    Alfred Levins, [place], MD 12/09/18 2220

## 2018-12-09 NOTE — ED Notes (Signed)
Patient describes her symptoms as panic attack, reports has been taking prednisone today was last dose, feeling her heart moving fast,. Presently denies chest pain.

## 2018-12-09 NOTE — Telephone Encounter (Signed)
Patient called and she says that she feels she's having a reaction to Prednisone. She says that she was prescribed prednisone on 12/03/18 by Ander Purpura and she was fine taking it up until today. She says "I took my last dose, then my energy level keeps getting higher and higher, heart rate getting high, then going back to normal. It happened and I thought I was going to pass out and my HR was 149 on the apple watch. I tried calming down, breathing, putting my head between my legs. I called the cardiology office and was told to call 911. I called EMS at 1512, they came out and they told me it was a reaction to the prednisone. They did an EKG and said my heart was fine, but my rate was high. They checked my blood sugar. The stayed for about 20 minutes. This is scary, I feel anxious and I've never had anxiety." I asked her to check her pulse and it is 122. "I took a muscle relaxer 1 hour ago, cyclobenzaprine, for my leg and hip hurting. I was hoping that would calm me down, but I'm calmed for a little bit, then I feel it all over again. I feel like I'm crawling out of my skin." I asked the patient to hold and I called the office and spoke to [place], [facility] who put Juliann Pulse, LPN on the phone. I conference Juliann Pulse and the patient to discuss her symptoms.  Reason for Disposition . Caller has URGENT medication question about med that PCP prescribed and triager unable to answer question  Protocols used: MEDICATION QUESTION CALL-A-AH

## 2018-12-09 NOTE — Telephone Encounter (Signed)
Spoke with patient she has not taken her Metoprolol today usually takes around 6 PM patient has propanolol prescribed  3 times daily for increased heart rate , patient has not taken, patient said she thought the propanolol was D/C due to results of last overnight ECG. Patient say heart rate keeps rising and she feels very anxious , advised patient PCP not in office advised to have someone drive her to ER with heart sustaining at 122-140. Patient stated she would go to ER.

## 2018-12-09 NOTE — Telephone Encounter (Signed)
Agree with recommendation to emergency room Thank you.

## 2018-12-09 NOTE — Telephone Encounter (Signed)
Spoke with patient and she reports calling 911 because she does not feel right. She reports elevated heart rates with a funny filling to her face. She also states that she felt like she was going to pass out but was able to catch herself. She wanted to know what she should do. She is waiting on EMS to arrive. Reviewed that prednisone can cause some of these feelings but EMS will certainly evaluate her when they arrive to see if further evaluation is needed. She verbalized understanding and will call back if any further questions.

## 2018-12-09 NOTE — ED Notes (Signed)
Pt let this EDT know that it was time for her to take her metoprolol at this time. RN Amy stated that it was okay for her to take it.

## 2018-12-11 NOTE — Telephone Encounter (Signed)
Seen in ed 

## 2018-12-13 NOTE — Telephone Encounter (Signed)
ER notes reviewed several episodes of diarrhea that day Sinus tach, was given fluids Follow up with PMD

## 2018-12-14 NOTE — Telephone Encounter (Signed)
Spoke with patient and she was very appreciative for the follow up call. Reviewed her upcoming appointment and she confirmed. She denies any further questions or concerns at this time.

## 2018-12-17 DIAGNOSIS — J019 Acute sinusitis, unspecified: Secondary | ICD-10-CM | POA: Diagnosis not present

## 2018-12-21 DIAGNOSIS — J069 Acute upper respiratory infection, unspecified: Secondary | ICD-10-CM | POA: Diagnosis not present

## 2018-12-23 DIAGNOSIS — J069 Acute upper respiratory infection, unspecified: Secondary | ICD-10-CM | POA: Diagnosis not present

## 2018-12-23 DIAGNOSIS — R05 Cough: Secondary | ICD-10-CM | POA: Diagnosis not present

## 2018-12-30 ENCOUNTER — Other Ambulatory Visit: Payer: Self-pay | Admitting: Gastroenterology

## 2019-01-02 ENCOUNTER — Encounter: Payer: Self-pay | Admitting: Emergency Medicine

## 2019-01-02 ENCOUNTER — Emergency Department
Admission: EM | Admit: 2019-01-02 | Discharge: 2019-01-02 | Disposition: A | Discharge status: Home / Self Care | Payer: Commercial Managed Care - PPO | Attending: Emergency Medicine | Admitting: Emergency Medicine

## 2019-01-02 ENCOUNTER — Emergency Department: Payer: Commercial Managed Care - PPO

## 2019-01-02 ENCOUNTER — Other Ambulatory Visit: Payer: Self-pay

## 2019-01-02 DIAGNOSIS — R0602 Shortness of breath: Secondary | ICD-10-CM | POA: Diagnosis not present

## 2019-01-02 DIAGNOSIS — R05 Cough: Secondary | ICD-10-CM | POA: Diagnosis present

## 2019-01-02 DIAGNOSIS — Z87891 Personal history of nicotine dependence: Secondary | ICD-10-CM | POA: Insufficient documentation

## 2019-01-02 DIAGNOSIS — J40 Bronchitis, not specified as acute or chronic: Secondary | ICD-10-CM | POA: Diagnosis not present

## 2019-01-02 DIAGNOSIS — Z79899 Other long term (current) drug therapy: Secondary | ICD-10-CM | POA: Diagnosis not present

## 2019-01-02 DIAGNOSIS — I10 Essential (primary) hypertension: Secondary | ICD-10-CM | POA: Insufficient documentation

## 2019-01-02 MED ORDER — DOXYCYCLINE HYCLATE 50 MG PO CAPS: 100.0000 mg | ORAL_CAPSULE | Freq: Two times a day (BID) | ORAL | 0 refills | Status: AC

## 2019-01-02 MED ORDER — GUAIFENESIN-DM 100-10 MG/5ML PO SYRP: 5.0000 mL | ORAL_SOLUTION | ORAL | 0 refills | Status: DC | PRN

## 2019-01-02 MED ORDER — BENZONATATE 100 MG PO CAPS: 100.0000 mg | ORAL_CAPSULE | Freq: Three times a day (TID) | ORAL | 0 refills | Status: DC | PRN

## 2019-01-02 NOTE — ED Triage Notes (Signed)
Cough x 3 weeks, pain upper back.

## 2019-01-02 NOTE — ED Provider Notes (Signed)
[facility] Emergency Department Provider Note  ____________________________________________  Time seen: Approximately 2:37 PM  I have reviewed the triage vital signs and the nursing notes.   HISTORY  Chief Complaint Cough    HPI Molly Stone is a 38 y.o. female that presents emergency department for evaluation of rhinorrhea and cough for 3 weeks.  Patient states that cough originally started out as nonproductive but in the last 3 days, she has been coughing up yellow sputum.  She occasionally has episodes of shortness of breath, worse with coughing.  Her husband is also been sick with URI symptoms.  Patient went to urgent care a couple of weeks ago and was given prescription for amoxicillin and cough medication.  She tried a course of prednisone 1 month ago for chronic back pain and states that it made her SVT flareup.  Patient tried to use her albuterol inhaler yesterday but felt her heart race shortly after treatment for a couple of minutes.  Patient is seeing cardiology for SVT.  She has an appointment with them this week.  Outside of the brief episode while using her albuterol inhaler, she has not felt any palpitations.  No vomiting, diarrhea.  Past Medical History:  Diagnosis Date  . Allergy   . Anemia   . Anxiety   . Asthma   . Chlamydia    h/o  . Disorder of vocal cord    spasmodic dysphonia  . Dysmenorrhea   . Dyspareunia, female   . Endometriosis   . Family history of endometriosis   . GERD (gastroesophageal reflux disease)   . Headache    migraine  . Heavy periods   . History of nephrolithiasis   . Hypertension   . Jaundice, physiologic, newborn   . Obesity (BMI 30-39.9)   . Sleep apnea   . Tobacco user   . Vaginal Pap smear, abnormal     Patient Active Problem List   Diagnosis Date Noted  . SVT (supraventricular tachycardia) ([place]) 09/12/2018  . History of right flank pain 04/14/2018  . Acute pain of left shoulder 03/26/2018  .  Calculi, ureter 07/30/2017  . Asthma 03/27/2017  . Chronic bilateral thoracic back pain 03/07/2017  . Spinal stenosis 03/05/2017  . Numbness and tingling 02/03/2017  . Shortness of breath 12/20/2016  . Whiplash injury syndrome, subsequent encounter 08/04/2016  . Concussion without loss of consciousness 08/04/2016  . OSA on CPAP 07/24/2016  . Generalized anxiety disorder 10/25/2015  . Tension headache, chronic 10/25/2015  . Family history of endometriosis 10/12/2015  . Dysmenorrhea 10/12/2015  . Acid reflux 08/15/2015  . H/O renal calculi 08/15/2015  . Obesity 08/15/2015  . Other malaise and fatigue 06/16/2014  . Encounter for preventive health examination 06/16/2014  . Malaise and fatigue 06/16/2014  . Sciatica 04/01/2014  . Vitamin D deficiency 04/12/2012  . 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  . History of nephrolithiasis   . GERD (gastroesophageal reflux disease)     Past Surgical History:  Procedure Laterality Date  . DILATATION & CURETTAGE/HYSTEROSCOPY WITH MYOSURE N/A 08/27/2018   Procedure: DILATATION & CURETTAGE/HYSTEROSCOPY WITH MINERVA;  Surgeon: Gae Dry, MD;  Location: [facility] ORS;  Service: Gynecology;  Laterality: N/A;  . DILATION AND CURETTAGE OF UTERUS    . ESOPHAGOGASTRODUODENOSCOPY (EGD) WITH PROPOFOL N/A 08/07/2018   Procedure: ESOPHAGOGASTRODUODENOSCOPY (EGD) WITH PROPOFOL;  Surgeon: Jonathon Bellows, MD;  Location: [facility] ENDOSCOPY;  Service: Gastroenterology;  Laterality: N/A;  . laparoscopy    . WISDOM TOOTH EXTRACTION  Prior to Admission medications   Medication Sig Start Date End Date Taking? Authorizing Provider  albuterol (PROVENTIL HFA;VENTOLIN HFA) 108 (90 Base) MCG/ACT inhaler Inhale 2 puffs into the lungs every 6 (six) hours as needed for wheezing or shortness of breath. 03/24/17   Flora Lipps, MD  benzonatate (TESSALON PERLES) 100 MG capsule Take 1 capsule (100 mg total) by mouth 3 (three) times daily as needed for cough. 01/02/19 01/02/20  Laban Emperor, PA-C  budesonide-formoterol (SYMBICORT) 160-4.5 MCG/ACT inhaler Inhale 2 puffs into the lungs 2 (two) times daily. Patient taking differently: Inhale 2 puffs into the lungs daily.  03/24/17   Flora Lipps, MD  calcium carbonate (TUMS - DOSED IN MG ELEMENTAL CALCIUM) 500 MG chewable tablet Chew 2 tablets by mouth daily.    [provider]  cyclobenzaprine (FLEXERIL) 5 MG tablet Take 1 tablet (5 mg total) by mouth 3 (three) times daily as needed for muscle spasms. 12/03/18   Jodelle Green, FNP  doxycycline (VIBRAMYCIN) 50 MG capsule Take 2 capsules (100 mg total) by mouth 2 (two) times daily for 10 days. 01/02/19 01/12/19  Laban Emperor, PA-C  fluticasone (FLONASE) 50 MCG/ACT nasal spray Place 1 spray into both nostrils daily. Patient taking differently: Place 1 spray into both nostrils 2 (two) times daily.  07/25/17   Crecencio Mc, MD  guaiFENesin-dextromethorphan (ROBITUSSIN DM) 100-10 MG/5ML syrup Take 5 mLs by mouth every 4 (four) hours as needed for cough. 01/02/19   Laban Emperor, PA-C  levocetirizine (XYZAL) 5 MG tablet Take 5 mg by mouth daily as needed for allergies.    [provider]  methylPREDNISolone (MEDROL DOSEPAK) 4 MG TBPK tablet Take according to pack instructions 12/03/18   Jodelle Green, FNP  metoprolol succinate (TOPROL-XL) 25 MG 24 hr tablet Take 1 tablet (25 mg total) by mouth daily. 09/11/18   Crecencio Mc, MD  montelukast (SINGULAIR) 10 MG tablet TAKE 1 TABLET AT BEDTIME 06/02/18   Crecencio Mc, MD  Multiple Vitamin (MULTIVITAMIN WITH MINERALS) TABS tablet Take 1 tablet by mouth daily.    [provider]  pantoprazole (PROTONIX) 40 MG tablet Take 1 tablet (40 mg total) by mouth 2 (two) times daily. 08/17/18 11/15/18  Jonathon Bellows, MD  propranolol (INDERAL) 20 MG tablet Take 1 tablet (20 mg total) by mouth 3 (three) times daily as needed (As needed for fast heart rate). 10/05/18   Minna Merritts, MD    Allergies Buprenorphine hcl;  Morphine and related; Prednisone; and Tramadol hcl  Family History  Problem Relation Age of Onset  . Endometriosis Mother   . Cancer Father        benign liver CA  . Stroke Father        recurrent blood clots on the brain   . Cancer Maternal Grandmother 30       ovarian Ca, still surviving   . Osteoporosis Maternal Grandmother   . Ovarian cancer Maternal Grandmother   . Heart disease Maternal Grandfather   . Cancer Maternal Grandfather 81       colon CA. metastatic  . Diabetes Maternal Uncle   . Heart disease Paternal Grandmother   . Breast cancer Neg Hx   . Colon cancer Neg Hx   . Kidney cancer Neg Hx   . Bladder Cancer Neg Hx     Social History Social History   Tobacco Use  . Smoking status: Former Smoker    Years: 10.00    Types: Cigarettes  Last attempt to quit: 10/2016    Years since quitting: 2.2  . Smokeless tobacco: Never Used  Substance Use Topics  . Alcohol use: Not Currently    Alcohol/week: 0.0 standard drinks  . Drug use: No     Review of Systems  Constitutional: No fever/chills Eyes: No visual changes. No discharge. ENT: Positive for congestion and rhinorrhea. Cardiovascular: No chest pain. Respiratory: Positive for cough. No SOB. Gastrointestinal: No abdominal pain.  No nausea, no vomiting.  No diarrhea.  No constipation. Musculoskeletal: Negative for musculoskeletal pain. Skin: Negative for rash, abrasions, lacerations, ecchymosis. Neurological: Negative for headaches.   ____________________________________________   PHYSICAL EXAM:  VITAL SIGNS: ED Triage Vitals  Enc Vitals Group     BP 01/02/19 1323 138/83     Pulse Rate 01/02/19 1323 100     Resp 01/02/19 1323 (!) 8     Temp 01/02/19 1323 98 F (36.7 C)     Temp Source 01/02/19 1323 Oral     SpO2 01/02/19 1323 99 %     Weight 01/02/19 1324 294 lb (133.4 kg)     Height 01/02/19 1324 5' 9.5" (1.765 m)     Head Circumference --      Peak Flow --      Pain Score 01/02/19 1324 4      Pain Loc --      Pain Edu? --      Excl. in [place]? --      Constitutional: Alert and oriented. Well appearing and in no acute distress. Eyes: Conjunctivae are normal. PERRL. EOMI. No discharge. Head: Atraumatic. ENT: No frontal and maxillary sinus tenderness.      Ears: Tympanic membranes pearly gray with good landmarks. No discharge.      Nose: Mild congestion/rhinnorhea.      Mouth/Throat: Mucous membranes are moist. Oropharynx non-erythematous. Tonsils not enlarged. No exudates. Uvula midline. Neck: No stridor.   Hematological/Lymphatic/Immunilogical: No cervical lymphadenopathy. Cardiovascular: Normal rate, regular rhythm.  Good peripheral circulation. Respiratory: Normal respiratory effort without tachypnea or retractions. Lungs CTAB. Good air entry to the bases with no decreased or absent breath sounds. Gastrointestinal: Bowel sounds 4 quadrants. Soft and nontender to palpation. No guarding or rigidity. No palpable masses. No distention. Musculoskeletal: Full range of motion to all extremities. No gross deformities appreciated. Neurologic:  Normal speech and language. No gross focal neurologic deficits are appreciated.  Skin:  Skin is warm, dry and intact. No rash noted. Psychiatric: Mood and affect are normal. Speech and behavior are normal. Patient exhibits appropriate insight and judgement.   ____________________________________________   LABS (all labs ordered are listed, but only abnormal results are displayed)  Labs Reviewed - No data to display ____________________________________________  EKG   ____________________________________________  RADIOLOGY Robinette Haines, personally viewed and evaluated these images (plain radiographs) as part of my medical decision making, as well as reviewing the written report by the radiologist.  Dg Chest 2 View  Result Date: 01/02/2019 CLINICAL DATA:  Cough for 3 weeks. EXAM: CHEST - 2 VIEW COMPARISON:  12/09/2018 and prior  radiographs FINDINGS: The cardiomediastinal silhouette is unremarkable. Mild chronic peribronchial thickening again noted. There is no evidence of focal airspace disease, pulmonary edema, suspicious pulmonary nodule/mass, pleural effusion, or pneumothorax. No acute bony abnormalities are identified. IMPRESSION: No active cardiopulmonary disease. Electronically Signed   By: Margarette Canada M.D.   On: 01/02/2019 14:13    ____________________________________________    PROCEDURES  Procedure(s) performed:    Procedures  SR  Medications -  No data to display   ____________________________________________   INITIAL IMPRESSION / ASSESSMENT AND PLAN / ED COURSE  Pertinent labs & imaging results that were available during my care of the patient were reviewed by me and considered in my medical decision making (see chart for details).  Review of the [place] CSRS was performed in accordance of the [place] prior to dispensing any controlled drugs.    Patient's diagnosis is consistent with bronchitis. Vital signs and exam are reassuring.  Chest x-ray negative for acute cardiopulmonary process.  EKG shows normal sinus rhythm.  Patient appears well and is staying well hydrated. Patient should alternate tylenol and ibuprofen for fever. Patient feels comfortable going home. Patient will be discharged home with prescriptions for doxycycline, robitussin, and tessalon perles. Patient is to follow up with PCP as needed or otherwise directed. Patient is given ED precautions to return to the ED for any worsening or new symptoms.     ____________________________________________  FINAL CLINICAL IMPRESSION(S) / ED DIAGNOSES  Final diagnoses:  Bronchitis      NEW MEDICATIONS STARTED DURING THIS VISIT:  ED Discharge Orders         Ordered    doxycycline (VIBRAMYCIN) 50 MG capsule  2 times daily     01/02/19 1516    benzonatate (TESSALON PERLES) 100 MG capsule  3 times daily PRN     01/02/19 1516     guaiFENesin-dextromethorphan (ROBITUSSIN DM) 100-10 MG/5ML syrup  Every 4 hours PRN     01/02/19 1516              This chart was dictated using voice recognition software/Dragon. Despite best efforts to proofread, errors can occur which can change the meaning. Any change was purely unintentional.    Laban Emperor, PA-C 01/02/19 1552    Harvest Dark, MD 01/02/19 1600

## 2019-01-02 NOTE — ED Notes (Signed)
EKG done on patient showed to A. Earleen Newport PA who asked NT to show to MD. This NT gave to oncoming NT who got signature needed and placed on chart

## 2019-01-04 ENCOUNTER — Telehealth: Payer: Self-pay | Admitting: Nurse Practitioner

## 2019-01-04 NOTE — Telephone Encounter (Signed)
Patient called and made aware. Appointment has been cancelled. She has been advised to call with any questions or if anything is needed.

## 2019-01-04 NOTE — Telephone Encounter (Signed)
Patient wants to know if she should cancel upcoming appt as she has bronchitis . She is Coughing no fever but she says she is always afebrile when sick.    Please advise

## 2019-01-04 NOTE — Telephone Encounter (Signed)
Palps very likely exacerbated by resp illness and meds required to treat resp illness.  Would rec continuing  blocker therapy and rescheduling cardiology appt with Dr. Rockey Situ @ a later date as we are trying to limit pts exposures to the system wherever we can, in the setting of the current pandemic.

## 2019-01-04 NOTE — Telephone Encounter (Signed)
Returned the call to the patient. She stated that she has been sick with bronchitis and wanted to know if she needed to be seen on Thursday.   She was and is still having palpitations with possible SVT's. This could be worse due to her being sick. She has been made aware that we may be screening patients for fever and if she does have a fever then she has been advised to stay home unless it's an emergency.  She will call back on Wednesday with an update on how she feels. She also has an appointment with her PCP on 01/08/19.

## 2019-01-07 ENCOUNTER — Ambulatory Visit: Payer: Commercial Managed Care - PPO | Admitting: Nurse Practitioner

## 2019-01-08 ENCOUNTER — Other Ambulatory Visit: Payer: Self-pay

## 2019-01-08 ENCOUNTER — Encounter: Payer: Self-pay | Admitting: Internal Medicine

## 2019-01-08 ENCOUNTER — Ambulatory Visit: Payer: Commercial Managed Care - PPO | Admitting: Internal Medicine

## 2019-01-08 DIAGNOSIS — I471 Supraventricular tachycardia: Secondary | ICD-10-CM | POA: Diagnosis not present

## 2019-01-08 DIAGNOSIS — J209 Acute bronchitis, unspecified: Secondary | ICD-10-CM

## 2019-01-08 DIAGNOSIS — J4520 Mild intermittent asthma, uncomplicated: Secondary | ICD-10-CM | POA: Diagnosis not present

## 2019-01-08 DIAGNOSIS — J42 Unspecified chronic bronchitis: Secondary | ICD-10-CM

## 2019-01-08 MED ORDER — LEVALBUTEROL HCL 0.63 MG/3ML IN NEBU: 0.6300 mg | INHALATION_SOLUTION | RESPIRATORY_TRACT | 12 refills | Status: DC | PRN

## 2019-01-08 NOTE — Progress Notes (Signed)
Subjective:  Patient ID: Molly Stone, female    DOB: 10-13-1981  Age: 38 y.o. MRN: [id number]  CC: Diagnoses of Chronic bronchitis with acute exacerbation ([place]) and SVT (supraventricular tachycardia) ([place]) were pertinent to this visit.  HPI Molly Stone presents for ER follow up for bronchitis.  She was treated on March 14  FOR BRONCHITIS  SUGGESTED BY CHEST XRAY with  doxycycline,  But no steroids due to Feb 19  episode of severe SVT that occurred after taking  a prednisone taper (had not used albuterol in the previous hours).  She was taken to ER  With HR of 140 observed for several hours in ED before being treated with  With IV fluids.  Still having some wheezing and cough and feels short of breath with exertion.   SVT: has been prescribed metoprolol XL 25 g daily  by cardiology but she states that it has been dropping  her HR into the 30's and 40's and makes her feel presyncopal . She takes it at night , episodes of bradycardia are happening the following day.  Has not tried the propranolol yet.    Outpatient Medications Prior to Visit  Medication Sig Dispense Refill  . albuterol (PROVENTIL HFA;VENTOLIN HFA) 108 (90 Base) MCG/ACT inhaler Inhale 2 puffs into the lungs every 6 (six) hours as needed for wheezing or shortness of breath. 18 g 3  . benzonatate (TESSALON PERLES) 100 MG capsule Take 1 capsule (100 mg total) by mouth 3 (three) times daily as needed for cough. 30 capsule 0  . budesonide-formoterol (SYMBICORT) 160-4.5 MCG/ACT inhaler Inhale 2 puffs into the lungs 2 (two) times daily. (Patient taking differently: Inhale 2 puffs into the lungs daily. ) 1 Inhaler 5  . calcium carbonate (TUMS - DOSED IN MG ELEMENTAL CALCIUM) 500 MG chewable tablet Chew 2 tablets by mouth daily.    Marland Kitchen doxycycline (VIBRAMYCIN) 50 MG capsule Take 2 capsules (100 mg total) by mouth 2 (two) times daily for 10 days. 40 capsule 0  . fluticasone (FLONASE) 50 MCG/ACT nasal spray Place 1 spray into both  nostrils daily. (Patient taking differently: Place 1 spray into both nostrils 2 (two) times daily. ) 15.8 g 5  . guaiFENesin-dextromethorphan (ROBITUSSIN DM) 100-10 MG/5ML syrup Take 5 mLs by mouth every 4 (four) hours as needed for cough. 118 mL 0  . metoprolol succinate (TOPROL-XL) 25 MG 24 hr tablet Take 1 tablet (25 mg total) by mouth daily. 90 tablet 3  . Multiple Vitamin (MULTIVITAMIN WITH MINERALS) TABS tablet Take 1 tablet by mouth daily.    . propranolol (INDERAL) 20 MG tablet Take 1 tablet (20 mg total) by mouth 3 (three) times daily as needed (As needed for fast heart rate). 90 tablet 3  . pantoprazole (PROTONIX) 40 MG tablet Take 1 tablet (40 mg total) by mouth 2 (two) times daily. 60 tablet 2  . cyclobenzaprine (FLEXERIL) 5 MG tablet Take 1 tablet (5 mg total) by mouth 3 (three) times daily as needed for muscle spasms. 30 tablet 0  . levocetirizine (XYZAL) 5 MG tablet Take 5 mg by mouth daily as needed for allergies.    . methylPREDNISolone (MEDROL DOSEPAK) 4 MG TBPK tablet Take according to pack instructions 21 tablet 0  . montelukast (SINGULAIR) 10 MG tablet TAKE 1 TABLET AT BEDTIME 90 tablet 1   Facility-Administered Medications Prior to Visit  Medication Dose Route Frequency Provider Last Rate Last Dose  . methacholine (PROVOCHOLINE) inhaler solution 8 mg  2 mL  Inhalation Once Crecencio Mc, MD       Followed by  . methacholine (PROVOCHOLINE) inhaler solution 32 mg  2 mL Inhalation Once Crecencio Mc, MD       Followed by  . albuterol (PROVENTIL) (2.5 MG/3ML) 0.083% nebulizer solution 2.5 mg  2.5 mg Nebulization Once Crecencio Mc, MD        Review of Systems;  Patient denies headache, fevers, malaise, unintentional weight loss, skin rash, eye pain, sinus congestion and sinus pain, sore throat, dysphagia,  hemoptysis , cough, dyspnea, wheezing, chest pain, palpitations, orthopnea, edema, abdominal pain, nausea, melena, diarrhea, constipation, flank pain, dysuria,  hematuria, urinary  Frequency, nocturia, numbness, tingling, seizures,  Focal weakness, Loss of consciousness,  Tremor, insomnia, depression, anxiety, and suicidal ideation.      Objective:  BP 120/86 (BP Location: Left Arm, Patient Position: Sitting, Cuff Size: Large)   Pulse (!) 112   Temp 98 F (36.7 C) (Oral)   Resp 15   Ht 5' 9.5" (1.765 m)   Wt 292 lb (132.5 kg)   LMP 12/10/2018   SpO2 95%   BMI 42.50 kg/m   BP Readings from Last 3 Encounters:  01/08/19 120/86  01/02/19 127/75  12/09/18 129/89    Wt Readings from Last 3 Encounters:  01/08/19 292 lb (132.5 kg)  01/02/19 294 lb (133.4 kg)  12/09/18 298 lb (135.2 kg)    General appearance: alert, cooperative and appears stated age Ears: normal TM's and external ear canals both ears Throat: lips, mucosa, and tongue normal; teeth and gums normal Neck: no adenopathy, no carotid bruit, supple, symmetrical, trachea midline and thyroid not enlarged, symmetric, no tenderness/mass/nodules Back: symmetric, no curvature. ROM normal. No CVA tenderness. Lungs: clear to auscultation bilaterally Heart: regular rate and rhythm, S1, S2 normal, no murmur, click, rub or gallop Abdomen: soft, non-tender; bowel sounds normal; no masses,  no organomegaly Pulses: 2+ and symmetric Skin: Skin color, texture, turgor normal. No rashes or lesions Lymph nodes: Cervical, supraclavicular, and axillary nodes normal.  No results found for: HGBA1C  Lab Results  Component Value Date   CREATININE 0.83 12/09/2018   CREATININE 0.70 09/17/2018   CREATININE 0.72 09/11/2018    Lab Results  Component Value Date   WBC 8.6 12/09/2018   HGB 14.7 12/09/2018   HCT 43.8 12/09/2018   PLT 215 12/09/2018   GLUCOSE 105 (H) 12/09/2018   CHOL 195 07/23/2016   TRIG 105.0 07/23/2016   HDL 40.90 07/23/2016   LDLDIRECT 155 (H) 08/18/2015   LDLCALC 133 (H) 07/23/2016   ALT 18 09/17/2018   AST 20 09/17/2018   NA 138 12/09/2018   K 3.9 12/09/2018   CL 108  12/09/2018   CREATININE 0.83 12/09/2018   BUN 15 12/09/2018   CO2 22 12/09/2018   TSH 0.53 09/11/2018    Dg Chest 2 View  Result Date: 01/02/2019 CLINICAL DATA:  Cough for 3 weeks. EXAM: CHEST - 2 VIEW COMPARISON:  12/09/2018 and prior radiographs FINDINGS: The cardiomediastinal silhouette is unremarkable. Mild chronic peribronchial thickening again noted. There is no evidence of focal airspace disease, pulmonary edema, suspicious pulmonary nodule/mass, pleural effusion, or pneumothorax. No acute bony abnormalities are identified. IMPRESSION: No active cardiopulmonary disease. Electronically Signed   By: Margarette Canada M.D.   On: 01/02/2019 14:13    Assessment & Plan:   Problem List Items Addressed This Visit    SVT (supraventricular tachycardia) ([place])    She reports episodes of symptomatic severe bradycardia recurring nearly  daily since starting metoprolol XL . Advised to stop mediation and try low dose propranolol       Chronic bronchitis with acute exacerbation ([place])    S/p treatment with doxycycline and albuterol MDI on March 14. She has  persistent symptoms secondary to bronchospasm and does not tolerate prednisone due to recurrent SVT   Ordering xopenex nebulized given history of SVT .  She has a history of childhood asthma,  Smoked until 2018.  Saw pulmonary ([facility]) once in 2018 for ER follow up      Relevant Medications   levalbuterol (XOPENEX) 0.63 MG/3ML nebulizer solution     A total of 25 minutes of face to face time was spent with patient more than half of which was spent in counselling about the above mentioned conditions  and coordination of care   I have discontinued Latrell M. Dhanani's montelukast, levocetirizine, methylPREDNISolone, and cyclobenzaprine. I am also having her start on levalbuterol. Additionally, I am having her maintain her albuterol, budesonide-formoterol, fluticasone, pantoprazole, calcium carbonate, multivitamin with minerals, metoprolol succinate,  propranolol, doxycycline, benzonatate, and guaiFENesin-dextromethorphan.  Meds ordered this encounter  Medications  . levalbuterol (XOPENEX) 0.63 MG/3ML nebulizer solution    Sig: Take 3 mLs (0.63 mg total) by nebulization every 4 (four) hours as needed for wheezing or shortness of breath.    Dispense:  3 mL    Refill:  12    DISPENSE AS WRITTEN  PATIENT HAS SVT AGGRAVATED BY ALBUTEROL    Medications Discontinued During This Encounter  Medication Reason  . montelukast (SINGULAIR) 10 MG tablet Error  . levocetirizine (XYZAL) 5 MG tablet Error  . methylPREDNISolone (MEDROL DOSEPAK) 4 MG TBPK tablet Error  . cyclobenzaprine (FLEXERIL) 5 MG tablet Error    Follow-up: No follow-ups on file.   Crecencio Mc, MD

## 2019-01-08 NOTE — Patient Instructions (Addendum)
Stop the metoprolol and start the propranolol  INSTEAD.  It is shorter acting so you will need to use it 3 times daily   I AM CHANGING YOUR ALBUTEROL TO GENERIC XOPENEX,  WHICH WILL NOT INCREASE YOUR HEART RATE AS MUCH AS THE ALBUTEROL  YOU CAN USE IT IN THE NEBULIZER AS NEEDED FOR WHEEZING

## 2019-01-10 ENCOUNTER — Encounter: Payer: Self-pay | Admitting: Internal Medicine

## 2019-01-10 NOTE — Assessment & Plan Note (Addendum)
S/p treatment with doxycycline and albuterol MDI on March 14. She has  persistent symptoms secondary to bronchospasm and does not tolerate prednisone due to recurrent SVT   Ordering xopenex nebulized given history of SVT .  She has a history of childhood asthma,  Smoked until 2018.  Saw pulmonary ([facility]) once in 2018 for ER follow up

## 2019-01-10 NOTE — Assessment & Plan Note (Signed)
She reports episodes of symptomatic severe bradycardia recurring nearly daily since starting metoprolol XL . Advised to stop mediation and try low dose propranolol

## 2019-01-11 ENCOUNTER — Other Ambulatory Visit: Payer: Self-pay

## 2019-01-11 ENCOUNTER — Telehealth: Payer: Self-pay | Admitting: Gastroenterology

## 2019-01-11 MED ORDER — PANTOPRAZOLE SODIUM 40 MG PO TBEC: 40.0000 mg | DELAYED_RELEASE_TABLET | Freq: Two times a day (BID) | ORAL | 2 refills | Status: DC

## 2019-01-11 NOTE — Telephone Encounter (Signed)
Inform 1. BID PPI 2. Avoid eating 2 hours before meals 3. IF still has dysphagia barium swallow with tablet  4. Webex visit in a week

## 2019-01-11 NOTE — Telephone Encounter (Signed)
Pt is calling she has been having food stuck in her throat on her right side of her throat she states it is very scary please advise  cb [phone]

## 2019-01-11 NOTE — Telephone Encounter (Signed)
Spoke with Molly Stone and informed her of Dr. Georgeann Oppenheim suggestions. We have refilled Molly Stone Protonix. Molly Stone agrees to have an E-visit with Dr. Vicente Males next week.

## 2019-01-18 ENCOUNTER — Telehealth: Payer: Commercial Managed Care - PPO | Admitting: Gastroenterology

## 2019-01-18 ENCOUNTER — Ambulatory Visit (INDEPENDENT_AMBULATORY_CARE_PROVIDER_SITE_OTHER): Payer: Commercial Managed Care - PPO | Admitting: Gastroenterology

## 2019-01-18 DIAGNOSIS — R1310 Dysphagia, unspecified: Secondary | ICD-10-CM

## 2019-01-18 DIAGNOSIS — K219 Gastro-esophageal reflux disease without esophagitis: Secondary | ICD-10-CM

## 2019-01-18 NOTE — Progress Notes (Signed)
Molly Stone , MD [address]  [place]  [place], Molly Stone  Main: [phone]  Fax: [phone]   Primary Care Physician: Crecencio Mc, MD  Virtual Visit via Telephone Note  I connected with patient on 01/18/19 at 10:00 AM EDT by telephone and verified that I am speaking with the correct person using two identifiers.   I discussed the limitations, risks, security and privacy concerns of performing an evaluation and management service by telephone and the availability of in person appointments. I also discussed with the patient that there may be a patient responsible charge related to this service. The patient expressed understanding and agreed to proceed.  Location of Patient: Home Location of Provider: Home Persons involved: Patient and provider only   History of Present Illness: Dysphagia   HPI: Molly Stone is a 38 y.o. female seen me last in 07/2018 for GERD/dysphagia . Since 2004 she has gained atleast 20 lbs.   She took  40 mg protonix once a day before breakfast ,does wake up in the night at times with symptoms. No family history of esophageal cancer. She says she has had issues with swallowing for "years" , sporadic, once a month, affects her "spit" , food also gets stuck and doe snot go down. Liquids go down fine. She has seasonal allergies.   08/07/18: EGD: 3 cm hiatal hernia seen , schatzkis ring seen , dilated with 18 F dilator with minimal effect . Esophageal biopsies showed inflammation and no EOE.   Interval history 08/17/2018-01/18/2019    After increasing her dose of PPI to BID all her symptoms have resolved , had a bad cold, had bronchitis , while was sick had a different asthma medication , felt her esophagus was closing up. After a few days later had difficulty swallowing , the food would not go down. She was reluctant to go to the ER, then tried water and went down very slowly, took a benadryl, after 4 hours it calmed down. Never  had an issue that bad in the past. Presently doing ok .   Presently she is taking prorotnix BID- 40 mg , presently no heartburn. . No change in weight,.  Current Outpatient Medications  Medication Sig Dispense Refill   calcium carbonate (TUMS - DOSED IN MG ELEMENTAL CALCIUM) 500 MG chewable tablet Chew 2 tablets by mouth daily.     fluticasone (FLONASE) 50 MCG/ACT nasal spray Place 1 spray into both nostrils daily. (Patient taking differently: Place 1 spray into both nostrils 2 (two) times daily. ) 15.8 g 5   levalbuterol (XOPENEX) 0.63 MG/3ML nebulizer solution Take 3 mLs (0.63 mg total) by nebulization every 4 (four) hours as needed for wheezing or shortness of breath. 3 mL 12   Multiple Vitamin (MULTIVITAMIN WITH MINERALS) TABS tablet Take 1 tablet by mouth daily.     pantoprazole (PROTONIX) 40 MG tablet Take 1 tablet (40 mg total) by mouth 2 (two) times daily. 60 tablet 2   propranolol (INDERAL) 20 MG tablet Take 1 tablet (20 mg total) by mouth 3 (three) times daily as needed (As needed for fast heart rate). 90 tablet 3   albuterol (PROVENTIL HFA;VENTOLIN HFA) 108 (90 Base) MCG/ACT inhaler Inhale 2 puffs into the lungs every 6 (six) hours as needed for wheezing or shortness of breath. (Patient not taking: Reported on 01/18/2019) 18 g 3   benzonatate (TESSALON PERLES) 100 MG capsule Take 1 capsule (100 mg total) by mouth 3 (three) times daily  as needed for cough. (Patient not taking: Reported on 01/18/2019) 30 capsule 0   budesonide-formoterol (SYMBICORT) 160-4.5 MCG/ACT inhaler Inhale 2 puffs into the lungs 2 (two) times daily. (Patient not taking: Reported on 01/18/2019) 1 Inhaler 5   guaiFENesin-dextromethorphan (ROBITUSSIN DM) 100-10 MG/5ML syrup Take 5 mLs by mouth every 4 (four) hours as needed for cough. (Patient not taking: Reported on 01/18/2019) 118 mL 0   metoprolol succinate (TOPROL-XL) 25 MG 24 hr tablet Take 1 tablet (25 mg total) by mouth daily. (Patient not taking: Reported  on 01/18/2019) 90 tablet 3   No current facility-administered medications for this visit.    Facility-Administered Medications Ordered in Other Visits  Medication Dose Route Frequency Provider Last Rate Last Dose   methacholine (PROVOCHOLINE) inhaler solution 8 mg  2 mL Inhalation Once Crecencio Mc, MD       Followed by   methacholine (PROVOCHOLINE) inhaler solution 32 mg  2 mL Inhalation Once Crecencio Mc, MD       Followed by   albuterol (PROVENTIL) (2.5 MG/3ML) 0.083% nebulizer solution 2.5 mg  2.5 mg Nebulization Once Crecencio Mc, MD        Allergies as of 01/18/2019 - Review Complete 01/18/2019  Allergen Reaction Noted   Buprenorphine hcl Itching and Other (See Comments) 08/18/2015   Morphine and related Itching and Other (See Comments) 03/18/2012   Prednisone Anxiety 06/14/2014   Tramadol hcl Other (See Comments) 06/11/2017    Review of Systems:    All systems reviewed and negative except where noted in HPI.   Observations/Objective:  Labs: CMP     Component Value Date/Time   NA 138 12/09/2018 1710   NA 135 (L) 11/16/2013 1038   K 3.9 12/09/2018 1710   K 3.9 11/16/2013 1038   CL 108 12/09/2018 1710   CL 104 11/16/2013 1038   CO2 22 12/09/2018 1710   CO2 27 11/16/2013 1038   GLUCOSE 105 (H) 12/09/2018 1710   GLUCOSE 78 11/16/2013 1038   BUN 15 12/09/2018 1710   BUN 9 11/16/2013 1038   CREATININE 0.83 12/09/2018 1710   CREATININE 0.61 08/18/2015 1511   CALCIUM 9.0 12/09/2018 1710   CALCIUM 8.5 11/16/2013 1038   PROT 7.4 09/17/2018 1717   ALBUMIN 4.0 09/17/2018 1717   AST 20 09/17/2018 1717   ALT 18 09/17/2018 1717   ALKPHOS 58 09/17/2018 1717   BILITOT 0.5 09/17/2018 1717   GFRNONAA >60 12/09/2018 1710   GFRNONAA >60 11/16/2013 1038   GFRNONAA >89 03/19/2012 0910   GFRAA >60 12/09/2018 1710   GFRAA >60 11/16/2013 1038   GFRAA >89 03/19/2012 0910   Lab Results  Component Value Date   WBC 8.6 12/09/2018   HGB 14.7 12/09/2018   HCT 43.8  12/09/2018   MCV 87.1 12/09/2018   PLT 215 12/09/2018    Imaging Studies: Dg Chest 2 View  Result Date: 01/02/2019 CLINICAL DATA:  Cough for 3 weeks. EXAM: CHEST - 2 VIEW COMPARISON:  12/09/2018 and prior radiographs FINDINGS: The cardiomediastinal silhouette is unremarkable. Mild chronic peribronchial thickening again noted. There is no evidence of focal airspace disease, pulmonary edema, suspicious pulmonary nodule/mass, pleural effusion, or pneumothorax. No acute bony abnormalities are identified. IMPRESSION: No active cardiopulmonary disease. Electronically Signed   By: Margarette Canada M.D.   On: 01/02/2019 14:13    Assessment and Plan:   Molly Stone is a 38 y.o. y/o female here to follow up for  GERD and dysphagia. All her symptoms had  resolved after increasing Protonix to 40 mg BID.  We subsequently decrease the dose of her PPI.  A few weeks back she says she had a severe bout of bronchitis was coughing up a lot and subsequently developed difficulty with her swallowing.  At one point of time she says no food was going down but that eased over 4 hours.  Since then she has been feeling gradually better.  She is back on 40 mg of Protonix twice a day.  I suspect that during the bout of bronchitis she had a significant increase in acid reflux and must have developed esophagitis and hence had difficulty swallowing.  With the improvement in her bronchitis and decrease in coughing it appears that her swallowing to has improved gradually.  Plan   1.  Reiterated lifestyle changes such as avoid eating for 2 to 3 hours before bedtime, has small meals more often rather than large meals at one time 2.  As a long-term strategy consider to lose weight gradually, may need to discuss about hiatal hernia repair if this is a recurrent issue. 3.  Continue Protonix 40 mg twice daily and could add Pepcid at night as well. 4.  If symptoms continue to be controlled then we can meet in person in 3 to 4 months at  the office but if she has worsening of her symptoms I have asked her to call us right away for a repeat telemetry visit.    Follow Up Instructions:    I discussed the assessment and treatment plan with the patient. The patient was provided an opportunity to ask questions and all were answered. The patient agreed with the plan and demonstrated an understanding of the instructions.   The patient was advised to call back or seek an in-person evaluation if the symptoms worsen or if the condition fails to improve as anticipated.  I provided 30 minutes of non-face-to-face time during this encounter.  Dr Molly Bellows MD,MRCP [facility]) Gastroenterology/Hepatology Pager: [phone]   Speech recognition software was used to dictate this note.

## 2019-01-21 ENCOUNTER — Other Ambulatory Visit: Payer: Self-pay

## 2019-01-21 ENCOUNTER — Telehealth: Payer: Self-pay | Admitting: Internal Medicine

## 2019-01-21 MED ORDER — PANTOPRAZOLE SODIUM 40 MG PO TBEC: 40.0000 mg | DELAYED_RELEASE_TABLET | Freq: Two times a day (BID) | ORAL | 2 refills | Status: DC

## 2019-01-21 NOTE — Telephone Encounter (Signed)
Copied from [place] [id number]. Topic: Quick Communication - See Telephone Encounter >> Jan 21, 2019  4:05 PM Loma Boston wrote: CRM for notification. See Telephone encounter for: 01/21/19. Pt is requesting a tele health visit with [facility]. She has had shingles before and is having the same symptoms again with her back itching and on fire..she does not want to come into the office, thinks it is coming from stress.   All contact info verified her call Number is [phone]

## 2019-01-22 ENCOUNTER — Other Ambulatory Visit: Payer: Self-pay

## 2019-01-22 ENCOUNTER — Ambulatory Visit (INDEPENDENT_AMBULATORY_CARE_PROVIDER_SITE_OTHER): Payer: Commercial Managed Care - PPO | Admitting: Family Medicine

## 2019-01-22 ENCOUNTER — Telehealth: Payer: Self-pay | Admitting: Cardiovascular Disease

## 2019-01-22 ENCOUNTER — Encounter: Payer: Self-pay | Admitting: Family Medicine

## 2019-01-22 DIAGNOSIS — B029 Zoster without complications: Secondary | ICD-10-CM

## 2019-01-22 MED ORDER — PANTOPRAZOLE SODIUM 40 MG PO TBEC: 40.0000 mg | DELAYED_RELEASE_TABLET | Freq: Two times a day (BID) | ORAL | 0 refills | Status: DC

## 2019-01-22 MED ORDER — VALACYCLOVIR HCL 1 G PO TABS: 1000.0000 mg | ORAL_TABLET | Freq: Three times a day (TID) | ORAL | 0 refills | Status: AC

## 2019-01-22 NOTE — Telephone Encounter (Signed)
Please call regarding HR STAT if HR is under 50 or over 120 (normal HR is 60-100 beats per minute)  1) What is your heart rate? Between 120 140  2) Do you have a log of your heart rate readings (document readings)?  Just has started 20 30 minutes ago  3) Do you have any other symptoms? Flushed, pt states she did vomit.

## 2019-01-22 NOTE — Telephone Encounter (Signed)
Called pt back.  Pt stated that she feels a severe stabbing pain and burning sensation on the left side of her back and right arm.  Pt said that it is also itchy and more of a surface pain.  Pt said onset of symptoms started a week ago.  Pt said that pain comes and goes.  Pt said that she does not have a rash.  Pt believes this to be shingles since she has had shingles before and this is how the symptoms started out before she acutally got a rash. Pt said that she thinks that it is coming from stress since her husband goes back and forth to [place] to work.  Pt said that her husband is working to help build a hospital in [place] and a co-work on his job has been tested for [employer] virus and had worked a whole week on the job before reporting his symptoms.  Husband came home yesterday from [place] after job closed down and is waiting on co-worker's test results.  Pt said that she, her husband and children had flu like symptoms last month.  Pt said that her and her husband was very sick.  Children sick but not as bad.  Pt said that she had an office visit w/ Dr. Derrel Nip but then later ended up in the ED on March 14 because of shortness of breath and shallow breathing.  Pt said that she was told she had bronchitis and was put on some strong antibiotics.  Pt said that her children tested negative for the flu but it took her daughtet about a month to get over it.   Informed pt that Dr. Lupita Dawn schedule is full today.  Pt ok with scheduling virtual visit appt w/ Philis Nettle, NP.  Pt scheduled at 1:20 pm today.  Email address:  [email or web address]

## 2019-01-22 NOTE — Addendum Note (Signed)
Addended by: Dorethea Clan on: 01/22/2019 08:19 AM   Modules accepted: Orders

## 2019-01-22 NOTE — Telephone Encounter (Signed)
Needs virtual visit set up for today at 120 PM

## 2019-01-22 NOTE — Progress Notes (Signed)
Patient ID: Molly Stone, female   DOB: Mar 30, 1981, 38 y.o.   MRN: [id number] Virtual Visit via Video Note  I connected with Molly Stone on 01/22/19 at  1:20 PM EDT by a video enabled telemedicine application and verified that I am speaking with the correct person using two identifiers. Location patient: home Location provider: [place] Persons participating in the virtual visit: patient, provider  I discussed the limitations of evaluation and management by telemedicine and the availability of in person appointments. The patient expressed understanding and agreed to proceed.   HPI:  Patient and I connected via video chat to discuss burning type sensation/rash on patient's back.  Patient has had shingles before, and it began with a burning type sensation, then developed into a rash previously; so this is why patient feels she is having a flare of shingles again.  Currently she reports increased stress in her life due to her husband having to travel to [place] to help build a hospital that has been set up to help overflowing patients from the novel coronavirus outbreak.  Patient has been at home with her children, doing homeschooling activities and trying to keep everyone safe.  Patient is very worried that her husband will become ill.  She believes her increased stress may have caused the shingles to flareup.  Denies fever or chills currently.  Denies body aches.  Denies cough, chest pain shortness of breath or wheezing.  Denies any GI or GU issues.  Patient states about a month ago she and her children all had cold/flulike illnesses but they all have recovered and had none of their cold/flulike symptoms anymore.  ROS: See pertinent positives and negatives per HPI.  Past Medical History:  Diagnosis Date  . Allergy   . Anemia   . Anxiety   . Asthma   . Chlamydia    h/o  . Disorder of vocal cord    spasmodic dysphonia  . Dysmenorrhea   . Dyspareunia, female   .  Endometriosis   . Family history of endometriosis   . GERD (gastroesophageal reflux disease)   . Headache    migraine  . Heavy periods   . History of nephrolithiasis   . Hypertension   . Jaundice, physiologic, newborn   . Obesity (BMI 30-39.9)   . Sleep apnea   . Tobacco user   . Vaginal Pap smear, abnormal    Past Surgical History:  Procedure Laterality Date  . DILATATION & CURETTAGE/HYSTEROSCOPY WITH MYOSURE N/A 08/27/2018   Procedure: DILATATION & CURETTAGE/HYSTEROSCOPY WITH MINERVA;  Surgeon: Gae Dry, MD;  Location: [facility] ORS;  Service: Gynecology;  Laterality: N/A;  . DILATION AND CURETTAGE OF UTERUS    . ESOPHAGOGASTRODUODENOSCOPY (EGD) WITH PROPOFOL N/A 08/07/2018   Procedure: ESOPHAGOGASTRODUODENOSCOPY (EGD) WITH PROPOFOL;  Surgeon: Jonathon Bellows, MD;  Location: [facility] ENDOSCOPY;  Service: Gastroenterology;  Laterality: N/A;  . laparoscopy    . WISDOM TOOTH EXTRACTION     Family History  Problem Relation Age of Onset  . Endometriosis Mother   . Cancer Father        benign liver CA  . Stroke Father        recurrent blood clots on the brain   . Cancer Maternal Grandmother 30       ovarian Ca, still surviving   . Osteoporosis Maternal Grandmother   . Ovarian cancer Maternal Grandmother   . Heart disease Maternal Grandfather   . Cancer Maternal Grandfather 73  colon CA. metastatic  . Diabetes Maternal Uncle   . Heart disease Paternal Grandmother   . Breast cancer Neg Hx   . Colon cancer Neg Hx   . Kidney cancer Neg Hx   . Bladder Cancer Neg Hx    Social History   Tobacco Use  . Smoking status: Former Smoker    Years: 10.00    Types: Cigarettes    Last attempt to quit: 10/2016    Years since quitting: 2.2  . Smokeless tobacco: Never Used  Substance Use Topics  . Alcohol use: Not Currently    Alcohol/week: 0.0 standard drinks    Current Outpatient Medications:  .  albuterol (PROVENTIL HFA;VENTOLIN HFA) 108 (90 Base) MCG/ACT inhaler, Inhale 2 puffs  into the lungs every 6 (six) hours as needed for wheezing or shortness of breath. (Patient not taking: Reported on 01/18/2019), Disp: 18 g, Rfl: 3 .  benzonatate (TESSALON PERLES) 100 MG capsule, Take 1 capsule (100 mg total) by mouth 3 (three) times daily as needed for cough. (Patient not taking: Reported on 01/18/2019), Disp: 30 capsule, Rfl: 0 .  budesonide-formoterol (SYMBICORT) 160-4.5 MCG/ACT inhaler, Inhale 2 puffs into the lungs 2 (two) times daily. (Patient not taking: Reported on 01/18/2019), Disp: 1 Inhaler, Rfl: 5 .  calcium carbonate (TUMS - DOSED IN MG ELEMENTAL CALCIUM) 500 MG chewable tablet, Chew 2 tablets by mouth daily., Disp: , Rfl:  .  fluticasone (FLONASE) 50 MCG/ACT nasal spray, Place 1 spray into both nostrils daily. (Patient taking differently: Place 1 spray into both nostrils 2 (two) times daily. ), Disp: 15.8 g, Rfl: 5 .  guaiFENesin-dextromethorphan (ROBITUSSIN DM) 100-10 MG/5ML syrup, Take 5 mLs by mouth every 4 (four) hours as needed for cough. (Patient not taking: Reported on 01/18/2019), Disp: 118 mL, Rfl: 0 .  levalbuterol (XOPENEX) 0.63 MG/3ML nebulizer solution, Take 3 mLs (0.63 mg total) by nebulization every 4 (four) hours as needed for wheezing or shortness of breath., Disp: 3 mL, Rfl: 12 .  metoprolol succinate (TOPROL-XL) 25 MG 24 hr tablet, Take 1 tablet (25 mg total) by mouth daily. (Patient not taking: Reported on 01/18/2019), Disp: 90 tablet, Rfl: 3 .  Multiple Vitamin (MULTIVITAMIN WITH MINERALS) TABS tablet, Take 1 tablet by mouth daily., Disp: , Rfl:  .  pantoprazole (PROTONIX) 40 MG tablet, Take 1 tablet (40 mg total) by mouth 2 (two) times daily., Disp: 180 tablet, Rfl: 0 .  propranolol (INDERAL) 20 MG tablet, Take 1 tablet (20 mg total) by mouth 3 (three) times daily as needed (As needed for fast heart rate)., Disp: 90 tablet, Rfl: 3 .  valACYclovir (VALTREX) 1000 MG tablet, Take 1 tablet (1,000 mg total) by mouth 3 (three) times daily for 7 days., Disp: 21  tablet, Rfl: 0  EXAM:  VITALS per patient if applicable:  GENERAL: alert, oriented, appears well and in no acute distress  HEENT: atraumatic, conjunttiva clear, no obvious abnormalities on inspection of external nose and ears  NECK: normal movements of the head and neck  LUNGS: on inspection no signs of respiratory distress, breathing rate appears normal, no obvious gross SOB, gasping or wheezing  CV: no obvious cyanosis  MS: moves all visible extremities without noticeable abnormality  SKIN: Skin on back does not appear to look abnormal, view of skin over the video camera not as close up as I would like to fully examine.   PSYCH/NEURO: pleasant and cooperative, no obvious depression or anxiety, speech and thought processing grossly intact  ASSESSMENT AND  PLAN:  Discussed the following assessment and plan:  Herpes zoster without complication - Plan: valACYclovir (VALTREX) 1000 MG tablet  Due to patient's shingles flare presenting with similar timeline of symptoms previously to current symptoms, I suspect patient is having a shingles flareup.  She will take Valtrex 3 times daily for 7 days.  Advised to monitor herself for any worsening of rash or any development of new symptoms such as fever, body aches, cough, shortness of breath, chest pain, GI or GU issues.  Advised that if any new symptoms do develop to call office right away for further instruction.  Discussed remaining home as much as possible during this pandemic and stressed good handwashing. Patient verbalized understanding.   I discussed the assessment and treatment plan with the patient. The patient was provided an opportunity to ask questions and all were answered. The patient agreed with the plan and demonstrated an understanding of the instructions.   The patient was advised to call back or seek an in-person evaluation if the symptoms worsen or if the condition fails to improve as anticipated, as well as if new symptoms  develop.   Jodelle Green, FNP

## 2019-01-22 NOTE — Telephone Encounter (Signed)
Spoke with the pt.  Pt sts that he Metoprolol was d/c by her pcp Dr. Derrel Nip due to bradycardia. Around 3:30pm she has an episode of fast HR 120-140bpm. Pt has a apple watch and pulse ox that displayed the same reading. She took a 20mg  Propanolol, but through up shortly after. Pt denies chest pain, sob, palpitations, pre-syncope or syncope. Adv pt to go ahead and take another 20mg  of Propanolol now. Adv her to hydrate well and rest. Instructed her to take another Propanolol 20mg  in 3-4 hours if her HR remains elevated. Instructed her to call 911 if pre-syncope or syncope occur. Adv her that per Dr.Gollan instructions she can take up to (3) 20mg  Propanolol prn for fast HR. Adv her to contact the on call provider after hours to help avoid her going to the ED if possible.  Pt agreeable with the plan and voiced appreciation for the call.

## 2019-01-22 NOTE — Telephone Encounter (Signed)
Pt set up for Virtual visit today at 1:20pm today

## 2019-01-24 NOTE — Telephone Encounter (Signed)
We can set up an evisit if she would like to discuss

## 2019-01-27 ENCOUNTER — Other Ambulatory Visit: Payer: Self-pay | Admitting: *Deleted

## 2019-01-28 ENCOUNTER — Telehealth (INDEPENDENT_AMBULATORY_CARE_PROVIDER_SITE_OTHER): Payer: Commercial Managed Care - PPO | Admitting: Cardiovascular Disease

## 2019-01-28 ENCOUNTER — Telehealth: Payer: Self-pay

## 2019-01-28 ENCOUNTER — Telehealth: Payer: Self-pay | Admitting: Cardiovascular Disease

## 2019-01-28 ENCOUNTER — Other Ambulatory Visit: Payer: Self-pay

## 2019-01-28 DIAGNOSIS — I479 Paroxysmal tachycardia, unspecified: Secondary | ICD-10-CM | POA: Diagnosis not present

## 2019-01-28 DIAGNOSIS — F419 Anxiety disorder, unspecified: Secondary | ICD-10-CM | POA: Insufficient documentation

## 2019-01-28 DIAGNOSIS — I471 Supraventricular tachycardia: Secondary | ICD-10-CM

## 2019-01-28 DIAGNOSIS — G4733 Obstructive sleep apnea (adult) (pediatric): Secondary | ICD-10-CM

## 2019-01-28 DIAGNOSIS — R0602 Shortness of breath: Secondary | ICD-10-CM | POA: Insufficient documentation

## 2019-01-28 MED ORDER — PROPRANOLOL HCL 20 MG PO TABS: 20.0000 mg | ORAL_TABLET | Freq: Three times a day (TID) | ORAL | 3 refills | Status: DC | PRN

## 2019-01-28 MED ORDER — NEBIVOLOL HCL 10 MG PO TABS: 10.0000 mg | ORAL_TABLET | Freq: Every day | ORAL | 3 refills | Status: DC

## 2019-01-28 NOTE — Patient Instructions (Addendum)
Medication Instructions:  Your physician has recommended you make the following change in your medication:  1. START Bystolic 10 mg One tablet daily (samples and coupon have been provided) 2. TAKE Propranolol 20 mg three times daily as needed for breakthrough tachycardia or palpitations.   Your physician has requested that you regularly monitor and record your blood pressure readings at home. Please use the same machine at the same time of day to check your readings and record them to bring to your follow-up visit.   If you need a refill on your cardiac medications before your next appointment, please call your pharmacy.    Lab work: No new labs needed   If you have labs (blood work) drawn today and your tests are completely normal, you will receive your results only by: Marland Kitchen MyChart Message (if you have MyChart) OR . A paper copy in the mail If you have any lab test that is abnormal or we need to change your treatment, we will call you to review the results.   Testing/Procedures: No new testing needed   Follow-Up: At [facility], you and your health needs are our priority.  As part of our continuing mission to provide you with exceptional heart care, we have created designated Provider Care Teams.  These Care Teams include your primary Cardiologist (physician) and Advanced Practice Providers (APPs -  Physician Assistants and Nurse Practitioners) who all work together to provide you with the care you need, when you need it.  . You will need a follow up appointment in 3 months .   Please call our office 2 months in advance to schedule this appointment.    . Providers on your designated Care Team:   . Murray Hodgkins, NP . Christell Faith, PA-C . Marrianne Mood, PA-C  Any Other Special Instructions Will Be Listed Below (If Applicable).  For educational health videos Log in to : [email or web address] Or : [email or web address], password : [email or web address]

## 2019-01-28 NOTE — Telephone Encounter (Signed)
Spoke with patient.  Made sure that patient received instructions for how today's video visit would work.  Did a "trial doxy" with patient.  Video and audio worked.  Told her to follow the same steps at the time of her appointment.

## 2019-01-28 NOTE — Telephone Encounter (Signed)
I spoke with the patient- she has an E-visit scheduled with Dr. Rockey Situ this afternoon at 3:40 pm.  She is aware we will hold off on refilling her medication until that time.

## 2019-01-28 NOTE — Telephone Encounter (Signed)
I spoke with the patient. She is agreeable to an E-visit with Dr. Rockey Situ today. She has been scheduled for 3:40 pm today. The patient is aware that we have a propranolol RX pending for her, but will hold off on refilling this until her visit with Dr. Rockey Situ this afternoon.  She voices understanding and is agreeable.

## 2019-01-28 NOTE — Telephone Encounter (Addendum)
    Virtual Visit Pre-Appointment Phone Call  Steps For Call:  1. Confirm consent - "In the setting of the current Covid19 crisis, you are scheduled for a Video visit with your provider on (Thursday 01/28/19) at (3:40 pm).  Just as we do with many in-office visits, in order for you to participate in this visit, we must obtain consent.  If you'd like, I can send this to your mychart (if signed up) or email for you to review.  Otherwise, I can obtain your verbal consent now.  All virtual visits are billed to your insurance company just like a normal visit would be.  By agreeing to a virtual visit, we'd like you to understand that the technology does not allow for your provider to perform an examination, and thus may limit your provider's ability to fully assess your condition.  Finally, though the technology is pretty good, we cannot assure that it will always work on either your or our end, and in the setting of a video visit, we may have to convert it to a phone-only visit.  In either situation, we cannot ensure that we have a secure connection.  Are you willing to proceed?"    TELEPHONE CALL NOTE  Arlayne ALIZIA GREIF has been deemed a candidate for a follow-up tele-health visit to limit community exposure during the Covid-19 pandemic. I spoke with the patient via phone to ensure availability of phone/video source, confirm preferred email & phone number, and discuss instructions and expectations.  I reminded MALAAK STACH to be prepared with any vital sign and/or heart rhythm information that could potentially be obtained via home monitoring, at the time of her visit. I reminded MAKINZEY BANES to expect a phone call at the time of her visit if her visit.  Did the patient verbally acknowledge consent to treatment? Verbal Consent obtained  Alvis Lemmings, RN 01/28/2019 8:54 AM

## 2019-01-28 NOTE — Progress Notes (Addendum)
Virtual Visit via Video Note   This visit type was conducted due to national recommendations for restrictions regarding the COVID-19 Pandemic (e.g. social distancing) in an effort to limit this patient's exposure and mitigate transmission in our community.  Due to her co-morbid illnesses, this patient is at least at moderate risk for complications without adequate follow up.  This format is felt to be most appropriate for this patient at this time.  All issues noted in this document were discussed and addressed.  A limited physical exam was performed with this format.  Please refer to the patient's chart for her consent to telehealth for [facility].    Date:  01/28/2019   ID:  Molly Stone, DOB 13-Aug-1981, MRN [id number]  Patient Location:  [place] [address]   Provider location:   [facility], [place] office  PCP:  Crecencio Mc, MD  Cardiologist:  Patsy Baltimore  Chief Complaint: Palpitations, tachycardia    History of Present Illness:    Molly Stone is a 38 y.o. female who presents via audio/video conferencing for a telehealth visit today.   The patient does not symptoms concerning for COVID-19 infection (fever, chills, cough, or new SHORTNESS OF BREATH).   Patient has a past medical history of palpitations  hypertension.  Morbid obesity OSA  Not on CPAP Who presents for further discussion of her tachycardia/palpitations  She has a history of paroxysmal tachycardia On last clinic visit started on metoprolol  Event monitor, results discussed with her in detail Normal sinus rhythm Avg HR of 92 bpm.  1 run of Ventricular Tachycardia occurred lasting 16 beats with a max rate of 203 bpm (avg 181 bpm).  1 run of Supraventricular Tachycardia occurred lasting 4 beats with a max rate of 158 bpm (avg 144 bpm).  Isolated SVEs were rare (<1.0%), SVE Triplets were rare (<1.0%), and no SVE Couplets were present. Isolated VEs were  rare (<1.0%), and no VE Couplets or VE Triplets were present. Patient triggered events were not associated with significant arrhythmia  Echocardiogram confirming ejection fraction greater than 55%   Reported having some vague feelings of feeling washed out, Feels uneasy, dread in her face, Panic attack Doing cold compress, Takes a few hours  Metoprolol was changed to propranolol 20 3 times daily Does not notice any difference Prefers to take daily medication with less side effects  Had bronchitis, recovered, wonders if this could have caused her symptoms Also worried about her husband who working out of state in virus area And comes home to her and the children, not practice good safe distancing Drinking fluids   Prior CV studies:   The following studies were reviewed today:  Echocardiogram and event monitor detailed above, discussed with her on today's visit  Past Medical History:  Diagnosis Date  . Allergy   . Anemia   . Anxiety   . Asthma   . Chlamydia    h/o  . Disorder of vocal cord    spasmodic dysphonia  . Dysmenorrhea   . Dyspareunia, female   . Endometriosis   . Family history of endometriosis   . GERD (gastroesophageal reflux disease)   . Headache    migraine  . Heavy periods   . History of nephrolithiasis   . Hypertension   . Jaundice, physiologic, newborn   . Obesity (BMI 30-39.9)   . Sleep apnea   . Tobacco user   . Vaginal Pap smear, abnormal  Past Surgical History:  Procedure Laterality Date  . DILATATION & CURETTAGE/HYSTEROSCOPY WITH MYOSURE N/A 08/27/2018   Procedure: DILATATION & CURETTAGE/HYSTEROSCOPY WITH MINERVA;  Surgeon: Gae Dry, MD;  Location: [facility] ORS;  Service: Gynecology;  Laterality: N/A;  . DILATION AND CURETTAGE OF UTERUS    . ESOPHAGOGASTRODUODENOSCOPY (EGD) WITH PROPOFOL N/A 08/07/2018   Procedure: ESOPHAGOGASTRODUODENOSCOPY (EGD) WITH PROPOFOL;  Surgeon: Jonathon Bellows, MD;  Location: [facility] ENDOSCOPY;  Service:  Gastroenterology;  Laterality: N/A;  . laparoscopy    . WISDOM TOOTH EXTRACTION       No outpatient medications have been marked as taking for the 01/28/19 encounter (Appointment) with Minna Merritts, MD.     Allergies:   Buprenorphine hcl; Morphine and related; Prednisone; and Tramadol hcl   Social History   Tobacco Use  . Smoking status: Former Smoker    Years: 10.00    Types: Cigarettes    Last attempt to quit: 10/2016    Years since quitting: 2.2  . Smokeless tobacco: Never Used  Substance Use Topics  . Alcohol use: Not Currently    Alcohol/week: 0.0 standard drinks  . Drug use: No     Current Outpatient Medications on File Prior to Visit  Medication Sig Dispense Refill  . albuterol (PROVENTIL HFA;VENTOLIN HFA) 108 (90 Base) MCG/ACT inhaler Inhale 2 puffs into the lungs every 6 (six) hours as needed for wheezing or shortness of breath. (Patient not taking: Reported on 01/18/2019) 18 g 3  . benzonatate (TESSALON PERLES) 100 MG capsule Take 1 capsule (100 mg total) by mouth 3 (three) times daily as needed for cough. (Patient not taking: Reported on 01/18/2019) 30 capsule 0  . budesonide-formoterol (SYMBICORT) 160-4.5 MCG/ACT inhaler Inhale 2 puffs into the lungs 2 (two) times daily. (Patient not taking: Reported on 01/18/2019) 1 Inhaler 5  . calcium carbonate (TUMS - DOSED IN MG ELEMENTAL CALCIUM) 500 MG chewable tablet Chew 2 tablets by mouth daily.    . fluticasone (FLONASE) 50 MCG/ACT nasal spray Place 1 spray into both nostrils daily. (Patient taking differently: Place 1 spray into both nostrils 2 (two) times daily. ) 15.8 g 5  . guaiFENesin-dextromethorphan (ROBITUSSIN DM) 100-10 MG/5ML syrup Take 5 mLs by mouth every 4 (four) hours as needed for cough. (Patient not taking: Reported on 01/18/2019) 118 mL 0  . levalbuterol (XOPENEX) 0.63 MG/3ML nebulizer solution Take 3 mLs (0.63 mg total) by nebulization every 4 (four) hours as needed for wheezing or shortness of breath. 3 mL 12   . metoprolol succinate (TOPROL-XL) 25 MG 24 hr tablet Take 1 tablet (25 mg total) by mouth daily. (Patient not taking: Reported on 01/18/2019) 90 tablet 3  . Multiple Vitamin (MULTIVITAMIN WITH MINERALS) TABS tablet Take 1 tablet by mouth daily.    . pantoprazole (PROTONIX) 40 MG tablet Take 1 tablet (40 mg total) by mouth 2 (two) times daily. 180 tablet 0  . propranolol (INDERAL) 20 MG tablet Take 1 tablet (20 mg total) by mouth 3 (three) times daily as needed (As needed for fast heart rate). 90 tablet 3  . valACYclovir (VALTREX) 1000 MG tablet Take 1 tablet (1,000 mg total) by mouth 3 (three) times daily for 7 days. 21 tablet 0   No current facility-administered medications on file prior to visit.      Family Hx: The patient's family history includes Cancer in her father; Cancer (age of onset: 75) in her maternal grandmother; Cancer (age of onset: 35) in her maternal grandfather; Diabetes in her maternal uncle;  Endometriosis in her mother; Heart disease in her maternal grandfather and paternal grandmother; Osteoporosis in her maternal grandmother; Ovarian cancer in her maternal grandmother; Stroke in her father. There is no history of Breast cancer, Colon cancer, Kidney cancer, or Bladder Cancer.  ROS:   Please see the history of present illness.    Review of Systems  Constitutional: Negative.   Respiratory: Negative.   Cardiovascular: Positive for palpitations.  Gastrointestinal: Negative.   Musculoskeletal: Negative.   Neurological: Negative.   Psychiatric/Behavioral: The patient is nervous/anxious.   All other systems reviewed and are negative.     Labs/Other Tests and Data Reviewed:    Recent Labs: 09/11/2018: Magnesium 2.1; TSH 0.53 09/17/2018: ALT 18 12/09/2018: BUN 15; Creatinine, Ser 0.83; Hemoglobin 14.7; Platelets 215; Potassium 3.9; Sodium 138   Recent Lipid Panel Lab Results  Component Value Date/Time   CHOL 195 07/23/2016 03:59 PM   TRIG 105.0 07/23/2016 03:59 PM    HDL 40.90 07/23/2016 03:59 PM   CHOLHDL 5 07/23/2016 03:59 PM   LDLCALC 133 (H) 07/23/2016 03:59 PM   LDLDIRECT 155 (H) 08/18/2015 03:11 PM    Wt Readings from Last 3 Encounters:  01/08/19 292 lb (132.5 kg)  01/02/19 294 lb (133.4 kg)  12/09/18 298 lb (135.2 kg)     Exam:    Vital Signs: Vital signs may also be detailed in the HPI There were no vitals taken for this visit.  Wt Readings from Last 3 Encounters:  01/08/19 292 lb (132.5 kg)  01/02/19 294 lb (133.4 kg)  12/09/18 298 lb (135.2 kg)   Temp Readings from Last 3 Encounters:  01/08/19 98 F (36.7 C) (Oral)  01/02/19 98.3 F (36.8 C) (Oral)  12/09/18 98.1 F (36.7 C) (Oral)   BP Readings from Last 3 Encounters:  01/08/19 120/86  01/02/19 127/75  12/09/18 129/89   Pulse Readings from Last 3 Encounters:  01/08/19 (!) 112  01/02/19 87  12/09/18 89     Well nourished, well developed female in no acute distress. Constitutional:  oriented to person, place, and time. No distress.  Head: Normocephalic and atraumatic.  Eyes:  no discharge. No scleral icterus.  Neck: Normal range of motion. Neck supple.  Pulmonary/Chest: No audible wheezing, no distress, appears comfortable Musculoskeletal: Normal range of motion.  no  tenderness or deformity.  Neurological:   Coordination normal. Full exam not performed Skin:  No rash Psychiatric:  normal mood and affect. behavior is normal. Thought content normal.    ASSESSMENT & PLAN:    Paroxysmal tachycardia (HCC) We will try to correlate her symptoms with event monitor,  Did not seem to correlate well as patient triggered events did not correlate with arrhythmia Despite that she does have elevated heart rate at baseline and ectopy, PACs She does not want to go back on propranolol or metoprolol Recommend she try Bystolic 10 mg daily with propranolol for breakthrough tachycardia or palpitations --I am concerned symptoms are driven by anxiety/stress  SVT (supraventricular  tachycardia) ([place]) Very short episodes Beta-blocker as above  Morbid obesity ([place]) We have encouraged continued exercise, careful diet management in an effort to lose weight.  OSA (obstructive sleep apnea) CPAP  Shortness of breath Recommended regular walking program for weight loss and conditioning  Anxiety Anxious about numerous things including husband out of state working in virus area coming home and possibly getting family sick Discussed various strategies she could do to stay safe  COVID-19 Education: The signs and symptoms of COVID-19 were discussed with  the patient and how to seek care for testing (follow up with PCP or arrange E-visit).  The importance of social distancing was discussed today.  Patient Risk:   After full review of this patients clinical status, I feel that they are at least moderate risk at this time.  Time:   Today, I have spent 25 minutes with the patient with telehealth technology discussing the cardiac and medical problems/diagnoses detailed above   Discussed tachycardia and palpitations   Medication Adjustments/Labs and Tests Ordered: Current medicines are reviewed at length with the patient today.  Concerns regarding medicines are outlined above.   Tests Ordered: No tests ordered   Medication Changes: Medication changes as above   Disposition: Follow-up in 3 months   Signed, Ida Rogue, MD  01/28/2019 4:04 PM    [place] Office [address], [place], [place] [address]

## 2019-01-28 NOTE — Telephone Encounter (Signed)
Noted,  Thank you!

## 2019-01-28 NOTE — Telephone Encounter (Signed)
Medication Samples have been provided to the patient.  Drug name: Bystolic     Strength: 10 mg         Qty: 3 Boxes   LOT: [id number]  Exp.Date: 07/2019  The patient has been instructed regarding the correct time, dose, and frequency of taking this medication, including desired effects and most common side effects.   Molly Stone 4:28 PM 01/28/2019

## 2019-02-04 ENCOUNTER — Other Ambulatory Visit: Payer: Self-pay | Admitting: Cardiovascular Disease

## 2019-02-22 ENCOUNTER — Ambulatory Visit (INDEPENDENT_AMBULATORY_CARE_PROVIDER_SITE_OTHER): Payer: Commercial Managed Care - PPO | Admitting: Family Medicine

## 2019-02-22 ENCOUNTER — Other Ambulatory Visit: Payer: Self-pay

## 2019-02-22 DIAGNOSIS — M5441 Lumbago with sciatica, right side: Secondary | ICD-10-CM

## 2019-02-22 DIAGNOSIS — M546 Pain in thoracic spine: Secondary | ICD-10-CM | POA: Diagnosis not present

## 2019-02-22 MED ORDER — CYCLOBENZAPRINE HCL 5 MG PO TABS: 5.0000 mg | ORAL_TABLET | Freq: Three times a day (TID) | ORAL | 1 refills | Status: DC | PRN

## 2019-02-22 NOTE — Progress Notes (Signed)
Patient ID: Molly Stone, female   DOB: 12-30-1980, 38 y.o.   MRN: [id number]  Virtual Visit via video Note  This visit type was conducted due to national recommendations for restrictions regarding the COVID-19 pandemic (e.g. social distancing).  This format is felt to be most appropriate for this patient at this time.  All issues noted in this document were discussed and addressed.  No physical exam was performed (except for noted visual exam findings with Video Visits).   I connected with [name] on 02/22/19 at  4:00 PM EDT by a video enabled telemedicine application and verified that I am speaking with the correct person using two identifiers. Location patient: home Location provider: [facility] Persons participating in the virtual visit: patient, provider  I discussed the limitations, risks, security and privacy concerns of performing an evaluation and management service by video and the availability of in person appointments. I also discussed with the patient that there may be a patient responsible charge related to this service. The patient expressed understanding and agreed to proceed.  HPI:  Patient and I connected via video due to right mid and right-sided low back pain that radiates all the way down right away into right ankle.  Patient states she has had flareups of this pain off and on for many months.  We did do an x-ray of low back back in February, and this did show minimal spondylosis of the lumbar spine.  Denies loss of bowel or bladder control.  Denies saddle anesthesia.  Denies numbness/tingling sensation in the leg.  Overall states her walking ability is normal, does have times where the pain will flareup and she will have to stop and stretch before she can continue to walk or do activities she was doing.  Denies fever or chills.  Denies rash.  Denies cough, shortness of breath or wheezing.  Denies GI or GU issues.   ROS: See pertinent positives and negatives  per HPI.  Past Medical History:  Diagnosis Date  . Allergy   . Anemia   . Anxiety   . Asthma   . Chlamydia    h/o  . Disorder of vocal cord    spasmodic dysphonia  . Dysmenorrhea   . Dyspareunia, female   . Endometriosis   . Family history of endometriosis   . GERD (gastroesophageal reflux disease)   . Headache    migraine  . Heavy periods   . History of nephrolithiasis   . Hypertension   . Jaundice, physiologic, newborn   . Obesity (BMI 30-39.9)   . Sleep apnea   . Tobacco user   . Vaginal Pap smear, abnormal     Past Surgical History:  Procedure Laterality Date  . DILATATION & CURETTAGE/HYSTEROSCOPY WITH MYOSURE N/A 08/27/2018   Procedure: DILATATION & CURETTAGE/HYSTEROSCOPY WITH MINERVA;  Surgeon: Gae Dry, MD;  Location: [facility] ORS;  Service: Gynecology;  Laterality: N/A;  . DILATION AND CURETTAGE OF UTERUS    . ESOPHAGOGASTRODUODENOSCOPY (EGD) WITH PROPOFOL N/A 08/07/2018   Procedure: ESOPHAGOGASTRODUODENOSCOPY (EGD) WITH PROPOFOL;  Surgeon: Jonathon Bellows, MD;  Location: [facility] ENDOSCOPY;  Service: Gastroenterology;  Laterality: N/A;  . laparoscopy    . WISDOM TOOTH EXTRACTION      Family History  Problem Relation Age of Onset  . Endometriosis Mother   . Cancer Father        benign liver CA  . Stroke Father        recurrent blood clots on the brain   .  Cancer Maternal Grandmother 30       ovarian Ca, still surviving   . Osteoporosis Maternal Grandmother   . Ovarian cancer Maternal Grandmother   . Heart disease Maternal Grandfather   . Cancer Maternal Grandfather 42       colon CA. metastatic  . Diabetes Maternal Uncle   . Heart disease Paternal Grandmother   . Breast cancer Neg Hx   . Colon cancer Neg Hx   . Kidney cancer Neg Hx   . Bladder Cancer Neg Hx    Social History   Tobacco Use  . Smoking status: Former Smoker    Years: 10.00    Types: Cigarettes    Last attempt to quit: 10/2016    Years since quitting: 2.3  . Smokeless tobacco: Never  Used  Substance Use Topics  . Alcohol use: Not Currently    Alcohol/week: 0.0 standard drinks    Current Outpatient Medications:  .  albuterol (PROVENTIL HFA;VENTOLIN HFA) 108 (90 Base) MCG/ACT inhaler, Inhale 2 puffs into the lungs every 6 (six) hours as needed for wheezing or shortness of breath., Disp: 18 g, Rfl: 3 .  benzonatate (TESSALON PERLES) 100 MG capsule, Take 1 capsule (100 mg total) by mouth 3 (three) times daily as needed for cough., Disp: 30 capsule, Rfl: 0 .  budesonide-formoterol (SYMBICORT) 160-4.5 MCG/ACT inhaler, Inhale 2 puffs into the lungs 2 (two) times daily., Disp: 1 Inhaler, Rfl: 5 .  calcium carbonate (TUMS - DOSED IN MG ELEMENTAL CALCIUM) 500 MG chewable tablet, Chew 2 tablets by mouth daily., Disp: , Rfl:  .  fluticasone (FLONASE) 50 MCG/ACT nasal spray, Place 1 spray into both nostrils daily. (Patient taking differently: Place 1 spray into both nostrils 2 (two) times daily. ), Disp: 15.8 g, Rfl: 5 .  guaiFENesin-dextromethorphan (ROBITUSSIN DM) 100-10 MG/5ML syrup, Take 5 mLs by mouth every 4 (four) hours as needed for cough., Disp: 118 mL, Rfl: 0 .  levalbuterol (XOPENEX) 0.63 MG/3ML nebulizer solution, Take 3 mLs (0.63 mg total) by nebulization every 4 (four) hours as needed for wheezing or shortness of breath., Disp: 3 mL, Rfl: 12 .  metoprolol succinate (TOPROL-XL) 25 MG 24 hr tablet, Take 1 tablet (25 mg total) by mouth daily., Disp: 90 tablet, Rfl: 3 .  Multiple Vitamin (MULTIVITAMIN WITH MINERALS) TABS tablet, Take 1 tablet by mouth daily., Disp: , Rfl:  .  nebivolol (BYSTOLIC) 10 MG tablet, Take 1 tablet (10 mg total) by mouth daily., Disp: 90 tablet, Rfl: 3 .  pantoprazole (PROTONIX) 40 MG tablet, Take 1 tablet (40 mg total) by mouth 2 (two) times daily., Disp: 180 tablet, Rfl: 0 .  propranolol (INDERAL) 20 MG tablet, TAKE 1 TABLET BY MOUTH THREE TIMES DAILY AS NEEDED FOR FAST HEART RATE, Disp: 90 tablet, Rfl: 0  EXAM:  GENERAL: alert, oriented, appears  well and in no acute distress  HEENT: atraumatic, conjunttiva clear, no obvious abnormalities on inspection of external nose and ears  NECK: normal movements of the head and neck  LUNGS: on inspection no signs of respiratory distress, breathing rate appears normal, no obvious gross SOB, gasping or wheezing  CV: no obvious cyanosis  MS: moves all visible extremities without noticeable abnormality  PSYCH/NEURO: pleasant and cooperative, no obvious depression or anxiety, speech and thought processing grossly intact  ASSESSMENT AND PLAN:  Discussed the following assessment and plan:  Acute right-sided low back pain with right-sided sciatica - Plan: cyclobenzaprine (FLEXERIL) 5 MG tablet, MR Lumbar Spine Wo Contrast  Thoracic  back pain, unspecified back pain laterality, unspecified chronicity - Plan: MR Thoracic Spine Wo Contrast  Due to continued pain we will get MRI for further investigation.  Patient will use Flexeril as needed for any muscle spasms that occur, she is aware this medication potentially could cause drowsiness.  Also advised she can use Tylenol as needed for any mild to moderate pain.  Discussed possibility of trying steroid taper, but patient would like to hold off on doing that due to steroids making her feel little jittery and she already has tachycardia.  Also discussed doing daily stretching exercises to help keep self from getting stiff and working out the muscles.  Recommended she can try topical rubs like BenGay, Biofreeze and heating pad when pain flares up.   I discussed the assessment and treatment plan with the patient. The patient was provided an opportunity to ask questions and all were answered. The patient agreed with the plan and demonstrated an understanding of the instructions.   The patient was advised to call back or seek an in-person evaluation if the symptoms worsen or if the condition fails to improve as anticipated.   Jodelle Green, FNP

## 2019-02-23 ENCOUNTER — Encounter: Payer: Self-pay | Admitting: Family Medicine

## 2019-02-23 NOTE — Telephone Encounter (Signed)
Melissa,  I forgot to type in the MRI order that patient wants the OPEN MRI due to having fear of small spaces. I let her know that this will most likely happen in [place].  Do I need to re-order?  Thanks!  LG

## 2019-02-23 NOTE — Telephone Encounter (Signed)
Yes, you do. You will need to chose [facility] ([facility]) as the preferred location. Thanks, [employer]

## 2019-03-05 ENCOUNTER — Other Ambulatory Visit: Payer: Self-pay

## 2019-03-05 ENCOUNTER — Telehealth: Payer: Self-pay | Admitting: Internal Medicine

## 2019-03-05 MED ORDER — BUDESONIDE-FORMOTEROL FUMARATE 160-4.5 MCG/ACT IN AERO: 2.0000 | INHALATION_SPRAY | Freq: Two times a day (BID) | RESPIRATORY_TRACT | 5 refills | Status: DC

## 2019-03-05 NOTE — Telephone Encounter (Signed)
Copied from [place] [phone]. Topic: Quick Communication - Rx Refill/Question >> Mar 05, 2019 12:41 PM Burchel, Abbi R wrote: Medication: budesonide-formoterol (SYMBICORT) 160-4.5 MCG/ACT inhaler [[id number]]    Preferred Pharmacy: [facility] [place], [place] AT [place]  [phone] (Phone) [phone] (Fax)    Pt was advised that RX refills may take up to 3 business days. We ask that you follow-up with your pharmacy.

## 2019-03-21 ENCOUNTER — Emergency Department
Admission: EM | Admit: 2019-03-21 | Discharge: 2019-03-21 | Disposition: A | Discharge status: Home / Self Care | Payer: Commercial Managed Care - PPO | Attending: Emergency Medicine | Admitting: Emergency Medicine

## 2019-03-21 ENCOUNTER — Emergency Department: Payer: Commercial Managed Care - PPO

## 2019-03-21 ENCOUNTER — Other Ambulatory Visit: Payer: Self-pay

## 2019-03-21 DIAGNOSIS — M79604 Pain in right leg: Secondary | ICD-10-CM | POA: Insufficient documentation

## 2019-03-21 DIAGNOSIS — Z87891 Personal history of nicotine dependence: Secondary | ICD-10-CM | POA: Insufficient documentation

## 2019-03-21 DIAGNOSIS — J45909 Unspecified asthma, uncomplicated: Secondary | ICD-10-CM | POA: Diagnosis not present

## 2019-03-21 DIAGNOSIS — R2241 Localized swelling, mass and lump, right lower limb: Secondary | ICD-10-CM | POA: Insufficient documentation

## 2019-03-21 DIAGNOSIS — I10 Essential (primary) hypertension: Secondary | ICD-10-CM | POA: Diagnosis not present

## 2019-03-21 DIAGNOSIS — Z79899 Other long term (current) drug therapy: Secondary | ICD-10-CM | POA: Insufficient documentation

## 2019-03-21 MED ORDER — NAPROXEN 500 MG PO TABS: 500.0000 mg | ORAL_TABLET | Freq: Two times a day (BID) | ORAL | 2 refills | Status: DC

## 2019-03-21 NOTE — ED Triage Notes (Signed)
Pt also endorses dizziness and weakness.

## 2019-03-21 NOTE — ED Provider Notes (Signed)
[facility] Emergency Department Provider Note   ____________________________________________    I have reviewed the triage vital signs and the nursing notes.   HISTORY  Chief Complaint Leg Pain     HPI Molly Stone is a 38 y.o. female who presents with complaints of right leg pain.  Patient reports for some time she has had pain in her medial proximal lower leg which she describes as a sharp pinching pain.  More recently she has had pain that is extended up her leg and into her buttocks.  She denies significant back pain.  No weakness, no numbness.  She feels that her calf has been intermittently swollen and reports a history of DVTs in her family.  No pleurisy or shortness of breath or chest pain  Past Medical History:  Diagnosis Date  . Allergy   . Anemia   . Anxiety   . Asthma   . Chlamydia    h/o  . Disorder of vocal cord    spasmodic dysphonia  . Dysmenorrhea   . Dyspareunia, female   . Endometriosis   . Family history of endometriosis   . GERD (gastroesophageal reflux disease)   . Headache    migraine  . Heavy periods   . History of nephrolithiasis   . Hypertension   . Jaundice, physiologic, newborn   . Obesity (BMI 30-39.9)   . Sleep apnea   . Tobacco user   . Vaginal Pap smear, abnormal     Patient Active Problem List   Diagnosis Date Noted  . Paroxysmal tachycardia ([place]) 01/28/2019  . Shortness of breath 01/28/2019  . Anxiety 01/28/2019  . SVT (supraventricular tachycardia) ([place]) 09/12/2018  . Acute pain of left shoulder 03/26/2018  . Calculi, ureter 07/30/2017  . Chronic bilateral thoracic back pain 03/07/2017  . Spinal stenosis 03/05/2017  . Numbness and tingling 02/03/2017  . Chronic bronchitis with acute exacerbation ([place]) 12/20/2016  . Concussion without loss of consciousness 08/04/2016  . OSA (obstructive sleep apnea) 07/24/2016  . Generalized anxiety disorder 10/25/2015  . Family history of endometriosis  10/12/2015  . Dysmenorrhea 10/12/2015  . H/O renal calculi 08/15/2015  . Morbid obesity ([place]) 08/15/2015  . Other malaise and fatigue 06/16/2014  . Encounter for preventive health examination 06/16/2014  . Malaise and fatigue 06/16/2014  . Sciatica 04/01/2014  . Vitamin D deficiency 04/12/2012  . 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  . GERD (gastroesophageal reflux disease)     Past Surgical History:  Procedure Laterality Date  . DILATATION & CURETTAGE/HYSTEROSCOPY WITH MYOSURE N/A 08/27/2018   Procedure: DILATATION & CURETTAGE/HYSTEROSCOPY WITH MINERVA;  Surgeon: Gae Dry, MD;  Location: [facility] ORS;  Service: Gynecology;  Laterality: N/A;  . DILATION AND CURETTAGE OF UTERUS    . ESOPHAGOGASTRODUODENOSCOPY (EGD) WITH PROPOFOL N/A 08/07/2018   Procedure: ESOPHAGOGASTRODUODENOSCOPY (EGD) WITH PROPOFOL;  Surgeon: Jonathon Bellows, MD;  Location: [facility] ENDOSCOPY;  Service: Gastroenterology;  Laterality: N/A;  . laparoscopy    . WISDOM TOOTH EXTRACTION      Prior to Admission medications   Medication Sig Start Date End Date Taking? Authorizing Provider  albuterol (PROVENTIL HFA;VENTOLIN HFA) 108 (90 Base) MCG/ACT inhaler Inhale 2 puffs into the lungs every 6 (six) hours as needed for wheezing or shortness of breath. 03/24/17   Flora Lipps, MD  benzonatate (TESSALON PERLES) 100 MG capsule Take 1 capsule (100 mg total) by mouth 3 (three) times daily as needed for cough. 01/02/19 01/02/20  Laban Emperor, PA-C  budesonide-formoterol (SYMBICORT) 160-4.5 MCG/ACT  inhaler Inhale 2 puffs into the lungs 2 (two) times daily. 03/05/19   Crecencio Mc, MD  calcium carbonate (TUMS - DOSED IN MG ELEMENTAL CALCIUM) 500 MG chewable tablet Chew 2 tablets by mouth daily.    [provider]  cyclobenzaprine (FLEXERIL) 5 MG tablet Take 1 tablet (5 mg total) by mouth 3 (three) times daily as needed for muscle spasms. 02/22/19   Guse, Jacquelynn Cree, FNP  fluticasone (FLONASE) 50 MCG/ACT nasal spray Place 1 spray into both  nostrils daily. Patient taking differently: Place 1 spray into both nostrils 2 (two) times daily.  07/25/17   Crecencio Mc, MD  guaiFENesin-dextromethorphan (ROBITUSSIN DM) 100-10 MG/5ML syrup Take 5 mLs by mouth every 4 (four) hours as needed for cough. 01/02/19   Laban Emperor, PA-C  levalbuterol Penne Lash) 0.63 MG/3ML nebulizer solution Take 3 mLs (0.63 mg total) by nebulization every 4 (four) hours as needed for wheezing or shortness of breath. 01/08/19   Crecencio Mc, MD  metoprolol succinate (TOPROL-XL) 25 MG 24 hr tablet Take 1 tablet (25 mg total) by mouth daily. 09/11/18   Crecencio Mc, MD  Multiple Vitamin (MULTIVITAMIN WITH MINERALS) TABS tablet Take 1 tablet by mouth daily.    [provider]  naproxen (NAPROSYN) 500 MG tablet Take 1 tablet (500 mg total) by mouth 2 (two) times daily with a meal. 03/21/19   Lavonia Drafts, MD  nebivolol (BYSTOLIC) 10 MG tablet Take 1 tablet (10 mg total) by mouth daily. 01/28/19   Minna Merritts, MD  pantoprazole (PROTONIX) 40 MG tablet Take 1 tablet (40 mg total) by mouth 2 (two) times daily. 01/22/19 04/22/19  Jonathon Bellows, MD  propranolol (INDERAL) 20 MG tablet TAKE 1 TABLET BY MOUTH THREE TIMES DAILY AS NEEDED FOR FAST HEART RATE 02/04/19   Minna Merritts, MD     Allergies Buprenorphine hcl; Morphine and related; Prednisone; and Tramadol hcl  Family History  Problem Relation Age of Onset  . Endometriosis Mother   . Cancer Father        benign liver CA  . Stroke Father        recurrent blood clots on the brain   . Cancer Maternal Grandmother 30       ovarian Ca, still surviving   . Osteoporosis Maternal Grandmother   . Ovarian cancer Maternal Grandmother   . Heart disease Maternal Grandfather   . Cancer Maternal Grandfather 6       colon CA. metastatic  . Diabetes Maternal Uncle   . Heart disease Paternal Grandmother   . Breast cancer Neg Hx   . Colon cancer Neg Hx   . Kidney cancer Neg Hx   . Bladder Cancer Neg Hx      Social History Social History   Tobacco Use  . Smoking status: Former Smoker    Years: 10.00    Types: Cigarettes    Last attempt to quit: 10/2016    Years since quitting: 2.4  . Smokeless tobacco: Never Used  Substance Use Topics  . Alcohol use: Not Currently    Alcohol/week: 0.0 standard drinks  . Drug use: No    Review of Systems  Constitutional: No fever/chills Eyes: No visual changes.  ENT: No sore throat. Cardiovascular: Denies chest pain. Respiratory: Denies shortness of breath. Gastrointestinal: No abdominal pain.  No nausea, no vomiting.   Genitourinary: Negative for dysuria. Musculoskeletal: As above Skin: Negative for rash. Neurological: Negative for headache   ____________________________________________   PHYSICAL EXAM:  VITAL SIGNS: ED Triage Vitals  Enc Vitals Group     BP 03/21/19 1403 (!) 158/94     Pulse Rate 03/21/19 1403 90     Resp 03/21/19 1403 18     Temp 03/21/19 1403 99.2 F (37.3 C)     Temp Source 03/21/19 1403 Oral     SpO2 03/21/19 1403 98 %     Weight --      Height --      Head Circumference --      Peak Flow --      Pain Score 03/21/19 1412 7     Pain Loc --      Pain Edu? --      Excl. in [place]? --     Constitutional: Alert and oriented.  Mouth/Throat: Mucous membranes are moist.    Cardiovascular: Normal rate, regular rhythm.   Good peripheral circulation. Respiratory: Normal respiratory effort.  No retractions Gastrointestinal: Soft and nontender. No distention.  No CVA tenderness. Genitourinary: deferred Musculoskeletal: Right leg: Full range of motion without pain, 2+ DP pulses, warm and well perfused.  No significant swelling.  No calf tenderness to palpation. Neurologic:  Normal speech and language. No gross focal neurologic deficits are appreciated.  Skin:  Skin is warm, dry and intact. No rash noted. Psychiatric: Mood and affect are normal. Speech and behavior are normal.   ____________________________________________   LABS (all labs ordered are listed, but only abnormal results are displayed)  Labs Reviewed - No data to display ____________________________________________  EKG  ED ECG REPORT I, Lavonia Drafts, the attending physician, personally viewed and interpreted this ECG.  Date: 03/21/2019  Rhythm: normal sinus rhythm QRS Axis: normal Intervals: normal ST/T Wave abnormalities: normal Narrative Interpretation: no evidence of acute ischemia  ____________________________________________  RADIOLOGY  Ultrasound negative for DVT ____________________________________________   PROCEDURES  Procedure(s) performed: No  Procedures   Critical Care performed: No ____________________________________________   INITIAL IMPRESSION / ASSESSMENT AND PLAN / ED COURSE  Pertinent labs & imaging results that were available during my care of the patient were reviewed by me and considered in my medical decision making (see chart for details).  Patient presents with right leg pain with some possible calf swelling.  Exam here is quite reassuring.  Ultrasound performed negative for DVT, patient is greatly relieved by this, suspect possible sciatica given the radiation of her pain.  Have asked her to follow-up with PCP, will trial NSAIDs    ____________________________________________   FINAL CLINICAL IMPRESSION(S) / ED DIAGNOSES  Final diagnoses:  Right leg pain        Note:  This document was prepared using Dragon voice recognition software and may include unintentional dictation errors.   Lavonia Drafts, MD 03/21/19 [phone]

## 2019-03-21 NOTE — ED Triage Notes (Signed)
Pt presents via POV c/o chronic right leg pain from knee to right hip. Reports seen here before with no resolution.

## 2019-03-21 NOTE — Discharge Instructions (Signed)
Your ultrasound was negative

## 2019-03-22 ENCOUNTER — Ambulatory Visit: Payer: Commercial Managed Care - PPO

## 2019-03-22 ENCOUNTER — Encounter: Payer: Self-pay | Admitting: Internal Medicine

## 2019-03-22 ENCOUNTER — Ambulatory Visit (INDEPENDENT_AMBULATORY_CARE_PROVIDER_SITE_OTHER): Payer: Commercial Managed Care - PPO | Admitting: Internal Medicine

## 2019-03-22 ENCOUNTER — Other Ambulatory Visit: Payer: Commercial Managed Care - PPO

## 2019-03-22 DIAGNOSIS — R42 Dizziness and giddiness: Secondary | ICD-10-CM

## 2019-03-22 DIAGNOSIS — I471 Supraventricular tachycardia: Secondary | ICD-10-CM

## 2019-03-22 DIAGNOSIS — F419 Anxiety disorder, unspecified: Secondary | ICD-10-CM

## 2019-03-22 DIAGNOSIS — M79661 Pain in right lower leg: Secondary | ICD-10-CM

## 2019-03-22 MED ORDER — BUDESONIDE-FORMOTEROL FUMARATE 160-4.5 MCG/ACT IN AERO: 2.0000 | INHALATION_SPRAY | Freq: Two times a day (BID) | RESPIRATORY_TRACT | 5 refills | Status: DC

## 2019-03-22 MED ORDER — FLUTICASONE-SALMETEROL 230-21 MCG/ACT IN AERO: 2.0000 | INHALATION_SPRAY | Freq: Two times a day (BID) | RESPIRATORY_TRACT | 12 refills | Status: DC

## 2019-03-22 NOTE — Progress Notes (Signed)
Virtual Visit via [email or web address]  This visit type was conducted due to national recommendations for restrictions regarding the COVID-19 pandemic (e.g. social distancing).  This format is felt to be most appropriate for this patient at this time.  All issues noted in this document were discussed and addressed.  No physical exam was performed (except for noted visual exam findings with Video Visits).   I connected with@ on 03/22/19 at 10:00 AM EDT by a video enabled telemedicine application or telephone and verified that I am speaking with the correct person using two identifiers. Location patient: home Location provider: work or home office Persons participating in the virtual visit: patient, provider  I discussed the limitations, risks, security and privacy concerns of performing an evaluation and management service by telephone and the availability of in person appointments. I also discussed with the patient that there may be a patient responsible charge related to this service. The patient expressed understanding and agreed to proceed.  Reason for visit:  ER follow up  HPI:  Patient was treated and released from ER on May 31 when she presented with calf pain, dyspnea , dizziness and feeling somewhat detached from her body.  DVT was ruled out and EKG was NSR    She now attributes her dizziness to inner ear problems resulting from sinus congestion and seasonal rhinitis . She has been taking zyrtec and using Flonase regularly.  No fevers anosmia or dysgeusia   ROS: See pertinent positives and negatives per HPI.  Past Medical History:  Diagnosis Date  . Allergy   . Anemia   . Anxiety   . Asthma   . Chlamydia    h/o  . Disorder of vocal cord    spasmodic dysphonia  . Dysmenorrhea   . Dyspareunia, female   . Endometriosis   . Family history of endometriosis   . GERD (gastroesophageal reflux disease)   . Headache    migraine  . Heavy periods   . History of nephrolithiasis   . Hypertension    . Jaundice, physiologic, newborn   . Obesity (BMI 30-39.9)   . Sleep apnea   . Tobacco user   . Vaginal Pap smear, abnormal     Past Surgical History:  Procedure Laterality Date  . DILATATION & CURETTAGE/HYSTEROSCOPY WITH MYOSURE N/A 08/27/2018   Procedure: DILATATION & CURETTAGE/HYSTEROSCOPY WITH MINERVA;  Surgeon: Gae Dry, MD;  Location: [facility] ORS;  Service: Gynecology;  Laterality: N/A;  . DILATION AND CURETTAGE OF UTERUS    . ESOPHAGOGASTRODUODENOSCOPY (EGD) WITH PROPOFOL N/A 08/07/2018   Procedure: ESOPHAGOGASTRODUODENOSCOPY (EGD) WITH PROPOFOL;  Surgeon: Jonathon Bellows, MD;  Location: [facility] ENDOSCOPY;  Service: Gastroenterology;  Laterality: N/A;  . laparoscopy    . WISDOM TOOTH EXTRACTION      Family History  Problem Relation Age of Onset  . Endometriosis Mother   . Cancer Father        benign liver CA  . Stroke Father        recurrent blood clots on the brain   . Cancer Maternal Grandmother 30       ovarian Ca, still surviving   . Osteoporosis Maternal Grandmother   . Ovarian cancer Maternal Grandmother   . Heart disease Maternal Grandfather   . Cancer Maternal Grandfather 63       colon CA. metastatic  . Diabetes Maternal Uncle   . Heart disease Paternal Grandmother   . Breast cancer Neg Hx   . Colon cancer Neg Hx   . Kidney  cancer Neg Hx   . Bladder Cancer Neg Hx     SOCIAL HX:  reports that she quit smoking about 2 years ago. Her smoking use included cigarettes. She quit after 10.00 years of use. She has never used smokeless tobacco. She reports previous alcohol use. She reports that she does not use drugs.   Current Outpatient Medications:  .  albuterol (PROVENTIL HFA;VENTOLIN HFA) 108 (90 Base) MCG/ACT inhaler, Inhale 2 puffs into the lungs every 6 (six) hours as needed for wheezing or shortness of breath., Disp: 18 g, Rfl: 3 .  calcium carbonate (TUMS - DOSED IN MG ELEMENTAL CALCIUM) 500 MG chewable tablet, Chew 2 tablets by mouth daily., Disp: , Rfl:   .  cyclobenzaprine (FLEXERIL) 5 MG tablet, Take 1 tablet (5 mg total) by mouth 3 (three) times daily as needed for muscle spasms., Disp: 30 tablet, Rfl: 1 .  fluticasone (FLONASE) 50 MCG/ACT nasal spray, Place 1 spray into both nostrils daily. (Patient taking differently: Place 1 spray into both nostrils 2 (two) times daily. ), Disp: 15.8 g, Rfl: 5 .  Multiple Vitamin (MULTIVITAMIN WITH MINERALS) TABS tablet, Take 1 tablet by mouth daily., Disp: , Rfl:  .  naproxen (NAPROSYN) 500 MG tablet, Take 1 tablet (500 mg total) by mouth 2 (two) times daily with a meal., Disp: 20 tablet, Rfl: 2 .  nebivolol (BYSTOLIC) 10 MG tablet, Take 1 tablet (10 mg total) by mouth daily., Disp: 90 tablet, Rfl: 3 .  pantoprazole (PROTONIX) 40 MG tablet, Take 1 tablet (40 mg total) by mouth 2 (two) times daily., Disp: 180 tablet, Rfl: 0 .  propranolol (INDERAL) 20 MG tablet, TAKE 1 TABLET BY MOUTH THREE TIMES DAILY AS NEEDED FOR FAST HEART RATE, Disp: 90 tablet, Rfl: 0 .  fluticasone-salmeterol (ADVAIR HFA) 230-21 MCG/ACT inhaler, Inhale 2 puffs into the lungs 2 (two) times daily., Disp: 36 g, Rfl: 12  EXAM:  VITALS per patient if applicable:  GENERAL: alert, oriented, appears well and in no acute distress  HEENT: atraumatic, conjunttiva clear, no obvious abnormalities on inspection of external nose and ears  NECK: normal movements of the head and neck  LUNGS: on inspection no signs of respiratory distress, breathing rate appears normal, no obvious gross SOB, gasping or wheezing  CV: no obvious cyanosis  MS: moves all visible extremities without noticeable abnormality  PSYCH/NEURO: pleasant and cooperative, no obvious depression or anxiety, speech and thought processing grossly intact  ASSESSMENT AND PLAN:  Discussed the following assessment and plan:  SVT (supraventricular tachycardia) (HCC)  Dizziness  Right calf pain  Anxiety  SVT (supraventricular tachycardia) (HCC) Now managed with Bystolic per  cardiology   Dizziness Suspect Eustachian tube dysfunction secondary to congestion from allergic rhinitis  . advised to add sudafed PE or Afrin to her current regimen of zyrtec and flonase.  .  She has an aversion to prednisone   Right calf pain DVT ruled out during recent ER visit   Anxiety Her anxiety appears to be increased and she is no longer taking  BuSpar. Will discus her reasons for stopping and advise alternative therapy if not tolerated     I discussed the assessment and treatment plan with the patient. The patient was provided an opportunity to ask questions and all were answered. The patient agreed with the plan and demonstrated an understanding of the instructions.   The patient was advised to call back or seek an in-person evaluation if the symptoms worsen or if the condition fails to  improve as anticipated.  I provided 25 minutes of non-face-to-face time during this encounter reviewing her ER records with her and assessing her current symptoms.   Crecencio Mc, MD

## 2019-03-23 DIAGNOSIS — R42 Dizziness and giddiness: Secondary | ICD-10-CM | POA: Insufficient documentation

## 2019-03-23 NOTE — Assessment & Plan Note (Signed)
Now managed with Bystolic per cardiology

## 2019-03-23 NOTE — Assessment & Plan Note (Addendum)
Suspect Eustachian tube dysfunction secondary to congestion from allergic rhinitis  . advised to add sudafed PE or Afrin to her current regimen of zyrtec and flonase.  .  She has an aversion to prednisone

## 2019-03-24 DIAGNOSIS — M79661 Pain in right lower leg: Secondary | ICD-10-CM | POA: Insufficient documentation

## 2019-03-24 NOTE — Assessment & Plan Note (Addendum)
Her anxiety appears to be increased and she is no longer taking  BuSpar. Will discus her reasons for stopping and advise alternative therapy if not tolerated

## 2019-03-24 NOTE — Assessment & Plan Note (Signed)
DVT ruled out during recent ER visit

## 2019-04-07 ENCOUNTER — Other Ambulatory Visit: Payer: Self-pay

## 2019-04-07 ENCOUNTER — Encounter: Payer: Self-pay | Admitting: Internal Medicine

## 2019-04-07 ENCOUNTER — Ambulatory Visit (INDEPENDENT_AMBULATORY_CARE_PROVIDER_SITE_OTHER): Payer: Commercial Managed Care - PPO | Admitting: Internal Medicine

## 2019-04-07 DIAGNOSIS — E559 Vitamin D deficiency, unspecified: Secondary | ICD-10-CM

## 2019-04-07 DIAGNOSIS — R7982 Elevated C-reactive protein (CRP): Secondary | ICD-10-CM

## 2019-04-07 DIAGNOSIS — M25532 Pain in left wrist: Secondary | ICD-10-CM | POA: Diagnosis not present

## 2019-04-07 DIAGNOSIS — R937 Abnormal findings on diagnostic imaging of other parts of musculoskeletal system: Secondary | ICD-10-CM

## 2019-04-07 DIAGNOSIS — M1990 Unspecified osteoarthritis, unspecified site: Secondary | ICD-10-CM

## 2019-04-07 DIAGNOSIS — E785 Hyperlipidemia, unspecified: Secondary | ICD-10-CM | POA: Insufficient documentation

## 2019-04-07 DIAGNOSIS — M2550 Pain in unspecified joint: Secondary | ICD-10-CM | POA: Diagnosis not present

## 2019-04-07 DIAGNOSIS — M5030 Other cervical disc degeneration, unspecified cervical region: Secondary | ICD-10-CM | POA: Diagnosis not present

## 2019-04-07 DIAGNOSIS — R739 Hyperglycemia, unspecified: Secondary | ICD-10-CM

## 2019-04-07 DIAGNOSIS — D72819 Decreased white blood cell count, unspecified: Secondary | ICD-10-CM

## 2019-04-07 HISTORY — DX: Other cervical disc degeneration, unspecified cervical region: M50.30

## 2019-04-07 MED ORDER — DICLOFENAC SODIUM 1 % TD GEL: 2.0000 g | Freq: Four times a day (QID) | TRANSDERMAL | 0 refills | Status: DC

## 2019-04-07 MED ORDER — HYDROCODONE-ACETAMINOPHEN 5-325 MG PO TABS: 0.5000 | ORAL_TABLET | Freq: Two times a day (BID) | ORAL | 0 refills | Status: DC | PRN

## 2019-04-07 NOTE — Progress Notes (Signed)
Telephone Note  I connected with Molly Stone   on 04/07/19 at  3:51 PM EDT by a video enabled telemedicine application and verified that I am speaking with the correct person using two identifiers.  Location patient: home Location provider:work  Persons participating in the virtual visit: patient, provider  I discussed the limitations of evaluation and management by telemedicine and the availability of in person appointments. The patient expressed understanding and agreed to proceed.   HPI: 1. Pt moved the yard with riding lawn mower and for X 2 nights c/o left wrist and forearm pain >10/10 at night worse when trying to sleep preventing her from sleeping. It feels like a "toothache" or aching pain and comes and goes.  She denies neck or shoulder pain but reports her left shoulder pops and 2nd row of knuckles in her fingers swelling intermittently on both hands and she notices with the rain pain and swelling in her knuckles. She tried Ibuprofen otc w/o help and Flexeril, Tylenol, heat/hot shower w/o help. She also has chronic numbness/tingling in left arm and previous EMG Dr. Melrose Nakayama 06/17/17 normal. She does not want to try prednisone due to triggering SVT in the past   She reports her grandmother has similar chronic aches in pains in her joints and her father has lupus   Reviewed prior imaging with mild thoracic DDD and MRI cervical abnormal 12/18/16 FINDINGS: Alignment: Straightening of cervical lordosis.  No listhesis.  Vertebrae: No fracture, evidence of discitis, or bone lesion.  Cord: Normal signal and morphology.  Posterior Fossa, vertebral arteries, paraspinal tissues: Negative.  Disc levels:  C2-3: No significant disc displacement, foraminal narrowing, or canal stenosis.  C3-4: Small central disc protrusion with effacement of ventral thecal sac and minimal anterior cord flattening. No significant foraminal narrowing or canal stenosis.  C4-5: Minimal disc bulge.  No significant foraminal narrowing or canal stenosis.  C5-6: Minimal disc bulge. No significant foraminal narrowing or canal stenosis.  C6-7: Minimal disc bulge. No significant foraminal narrowing or canal stenosis.  C7-T1: No significant disc displacement, foraminal narrowing, or canal stenosis.  IMPRESSION: 1. Mild cervical discogenic degenerative disease without significant foraminal narrowing or canal stenosis. 2. Small central C3-4 disc protrusion with effacement of ventral thecal sac and minimal anterior cord flattening.  Xray left shoulder 07/27/16 negative  FINDINGS: There is no evidence of fracture or dislocation. The left humeral head is seated within the glenoid fossa. The acromioclavicular joint is unremarkable in appearance. No significant soft tissue abnormalities are seen. The visualized portions of the left lung are clear.  IMPRESSION: No evidence of fracture or dislocation.  Reviewed autoimmune w/u 07/07/17 negative ANA, RF  ROS: See pertinent positives and negatives per HPI.  Past Medical History:  Diagnosis Date  . Allergy   . Anemia   . Anxiety   . Asthma   . Chlamydia    h/o  . Disorder of vocal cord    spasmodic dysphonia  . Dysmenorrhea   . Dyspareunia, female   . Endometriosis   . Family history of endometriosis   . GERD (gastroesophageal reflux disease)   . Headache    migraine  . Heavy periods   . History of nephrolithiasis   . Hypertension   . Jaundice, physiologic, newborn   . Obesity (BMI 30-39.9)   . Sleep apnea   . Tobacco user   . Vaginal Pap smear, abnormal     Past Surgical History:  Procedure Laterality Date  . DILATATION & CURETTAGE/HYSTEROSCOPY WITH MYOSURE  N/A 08/27/2018   Procedure: DILATATION & CURETTAGE/HYSTEROSCOPY WITH MINERVA;  Surgeon: Gae Dry, MD;  Location: [facility] ORS;  Service: Gynecology;  Laterality: N/A;  . DILATION AND CURETTAGE OF UTERUS    . ESOPHAGOGASTRODUODENOSCOPY (EGD) WITH PROPOFOL  N/A 08/07/2018   Procedure: ESOPHAGOGASTRODUODENOSCOPY (EGD) WITH PROPOFOL;  Surgeon: Jonathon Bellows, MD;  Location: [facility] ENDOSCOPY;  Service: Gastroenterology;  Laterality: N/A;  . laparoscopy    . WISDOM TOOTH EXTRACTION      Family History  Problem Relation Age of Onset  . Endometriosis Mother   . Cancer Father        benign liver CA  . Stroke Father        recurrent blood clots on the brain   . Lupus Father   . Cancer Maternal Grandmother 30       ovarian Ca, still surviving   . Osteoporosis Maternal Grandmother   . Ovarian cancer Maternal Grandmother   . Heart disease Maternal Grandfather   . Cancer Maternal Grandfather 69       colon CA. metastatic  . Diabetes Maternal Uncle   . Heart disease Paternal Grandmother   . Breast cancer Neg Hx   . Colon cancer Neg Hx   . Kidney cancer Neg Hx   . Bladder Cancer Neg Hx     SOCIAL HX: married with kids    Current Outpatient Medications:  .  albuterol (PROVENTIL HFA;VENTOLIN HFA) 108 (90 Base) MCG/ACT inhaler, Inhale 2 puffs into the lungs every 6 (six) hours as needed for wheezing or shortness of breath., Disp: 18 g, Rfl: 3 .  calcium carbonate (TUMS - DOSED IN MG ELEMENTAL CALCIUM) 500 MG chewable tablet, Chew 2 tablets by mouth daily., Disp: , Rfl:  .  Cholecalciferol (VITAMIN D3) 125 MCG (5000 UT) TABS, Take by mouth., Disp: , Rfl:  .  cyclobenzaprine (FLEXERIL) 5 MG tablet, Take 1 tablet (5 mg total) by mouth 3 (three) times daily as needed for muscle spasms., Disp: 30 tablet, Rfl: 1 .  fluticasone (FLONASE) 50 MCG/ACT nasal spray, Place 1 spray into both nostrils daily. (Patient taking differently: Place 1 spray into both nostrils 2 (two) times daily. ), Disp: 15.8 g, Rfl: 5 .  fluticasone-salmeterol (ADVAIR HFA) 230-21 MCG/ACT inhaler, Inhale 2 puffs into the lungs 2 (two) times daily., Disp: 36 g, Rfl: 12 .  Multiple Vitamin (MULTIVITAMIN WITH MINERALS) TABS tablet, Take 1 tablet by mouth daily., Disp: , Rfl:  .  nebivolol  (BYSTOLIC) 10 MG tablet, Take 1 tablet (10 mg total) by mouth daily., Disp: 90 tablet, Rfl: 3 .  pantoprazole (PROTONIX) 40 MG tablet, Take 1 tablet (40 mg total) by mouth 2 (two) times daily., Disp: 180 tablet, Rfl: 0 .  propranolol (INDERAL) 20 MG tablet, TAKE 1 TABLET BY MOUTH THREE TIMES DAILY AS NEEDED FOR FAST HEART RATE, Disp: 90 tablet, Rfl: 0 .  diclofenac sodium (VOLTAREN) 1 % GEL, Apply 2 g topically 4 (four) times daily. Upper body and 4 g qid lower body prn, Disp: 100 g, Rfl: 0 .  HYDROcodone-acetaminophen (NORCO) 5-325 MG tablet, Take 0.5-1 tablets by mouth every 12 (twelve) hours as needed for moderate pain., Disp: 10 tablet, Rfl: 0  EXAM:  VITALS per patient if applicable:  GENERAL: alert, oriented, appears well and in no acute distress  HEENT: atraumatic, conjunttiva clear, no obvious abnormalities on inspection of external nose and ears  NECK: normal movements of the head and neck  LUNGS: on inspection no signs of respiratory distress,  breathing rate appears normal, no obvious gross SOB, gasping or wheezing  CV: no obvious cyanosis  MS: moves all visible extremities without noticeable abnormality  PSYCH/NEURO: pleasant and cooperative, no obvious depression or anxiety, speech and thought processing grossly intact  ASSESSMENT AND PLAN:  Discussed the following assessment and plan:  Left wrist pain - Plan: diclofenac sodium (VOLTAREN) 1 % GEL 2 g qid prn, HYDROcodone-acetaminophen (NORCO) 5-325 MG tablet bid prn worst pain   Autoimmune labs (on note dad h/o lupus and GM h/o arthritis ? Type)  CMET, CBC, Antinuclear Antib (ANA), Rheumatoid Factor, Sedimentation rate, C-reactive protein, Uric acid, CYCLIC CITRUL PEPTIDE ANTIBODY, IGG/IGA  -refer to [facility] for now and Rx left wrist brace mailed to pt  -consider Xray left wrist with ortho  -with history of multiple sites of joint pain consider referral to rheumatology in future Dr. Amil Amen disc with pt today    DDD (degenerative disc disease), cervical -  See abnormal MRI 12/18/16 C spine  -consider repeat in future if ortho deems sxs related to this   Vitamin D deficiency - Plan: Vitamin D (25 hydroxy) rec take D3 5000 IU qd pt was taking 1000 IU qd otc   Hyperlipidemia, unspecified hyperlipidemia type - Plan: Lipid panel  Hyperglycemia - Plan: Hemoglobin A1c  Leukopenia, unspecified type - Plan: CBC with Differential/Platelet     I discussed the assessment and treatment plan with the patient. The patient was provided an opportunity to ask questions and all were answered. The patient agreed with the plan and demonstrated an understanding of the instructions.   The patient was advised to call back or seek an in-person evaluation if the symptoms worsen or if the condition fails to improve as anticipated.  Time spent 25 minutes  Delorise Jackson, MD

## 2019-04-07 NOTE — Patient Instructions (Signed)
Wrist Pain, Adult  There are many things that can cause wrist pain. Some common causes include:   An injury to the wrist area, such as a sprain, strain, or fracture.   Overuse of the joint.   A condition that causes increased pressure on a nerve in the wrist (carpal tunnel syndrome).   Wear and tear of the joints that occurs with aging (osteoarthritis).   A variety of other types of arthritis.  Sometimes, the cause of wrist pain is not known. Often, the pain goes away when you follow instructions from your health care provider for relieving pain at home, such as resting or icing the wrist. If your wrist pain continues, it is important to tell your health care provider.  Follow these instructions at home:   Rest the wrist area for at least 48 hours or as long as told by your health care provider.   If a splint or elastic bandage has been applied, use it as told by your health care provider.  ? Remove the splint or bandage only as told by your health care provider.  ? Loosen the splint or bandage if your fingers tingle, become numb, or turn cold or blue.   If directed, apply ice to the injured area.  ? If you have a removable splint or elastic bandage, remove it as told by your health care provider.  ? Put ice in a plastic bag.  ? Place a towel between your skin and the bag or between your splint or bandage and the bag.  ? Leave the ice on for 20 minutes, 2-3 times a day.     Keep your arm raised (elevated) above the level of your heart while you are sitting or lying down.   Take over-the-counter and prescription medicines only as told by your health care provider.   Keep all follow-up visits as told by your health care provider. This is important.  Contact a health care provider if:   You have a sudden sharp pain in the wrist, hand, or arm that is different or new.   The swelling or bruising on your wrist or hand gets worse.   Your skin becomes red, gets a rash, or has open sores.   Your pain does not  get better or it gets worse.  Get help right away if:   You lose feeling in your fingers or hand.   Your fingers turn white, very red, or cold and blue.   You cannot move your fingers.   You have a fever or chills.  This information is not intended to replace advice given to you by your health care provider. Make sure you discuss any questions you have with your health care provider.  Document Released: 07/17/2005 Document Revised: 05/02/2016 Document Reviewed: 04/25/2016  Elsevier Interactive Patient Education  2019 [employer]

## 2019-04-08 LAB — LIPID PANEL

## 2019-04-09 LAB — CBC WITH DIFFERENTIAL/PLATELET

## 2019-04-10 LAB — COMPREHENSIVE METABOLIC PANEL
ALT: 22 IU/L (ref 0–32)
AST: 18 IU/L (ref 0–40)
Albumin/Globulin Ratio: 1.5 (ref 1.2–2.2)
Albumin: 4 g/dL (ref 3.8–4.8)
Alkaline Phosphatase: 80 IU/L (ref 39–117)
BUN/Creatinine Ratio: 11 (ref 9–23)
BUN: 8 mg/dL (ref 6–20)
Bilirubin Total: 0.5 mg/dL (ref 0.0–1.2)
CO2: 23 mmol/L (ref 20–29)
Calcium: 8.8 mg/dL (ref 8.7–10.2)
Chloride: 102 mmol/L (ref 96–106)
Creatinine, Ser: 0.76 mg/dL (ref 0.57–1.00)
GFR calc Af Amer: 116 mL/min/{1.73_m2} (ref >59)
GFR calc non Af Amer: 101 mL/min/{1.73_m2} (ref >59)
Globulin, Total: 2.7 g/dL (ref 1.5–4.5)
Glucose: 101 mg/dL — ABNORMAL HIGH (ref 65–99)
Potassium: 4.1 mmol/L (ref 3.5–5.2)
Sodium: 138 mmol/L (ref 134–144)
Total Protein: 6.7 g/dL (ref 6.0–8.5)

## 2019-04-10 LAB — CBC WITH DIFFERENTIAL/PLATELET
Basophils Absolute: 0 10*3/uL (ref 0.0–0.2)
Basos: 1 %
EOS (ABSOLUTE): 0.3 10*3/uL (ref 0.0–0.4)
Eos: 11 %
Hematocrit: 43.4 % (ref 34.0–46.6)
Hemoglobin: 14.3 g/dL (ref 11.1–15.9)
Immature Grans (Abs): 0 10*3/uL (ref 0.0–0.1)
Immature Granulocytes: 0 %
Lymphocytes Absolute: 1.7 10*3/uL (ref 0.7–3.1)
Lymphs: 52 %
MCH: 29.6 pg (ref 26.6–33.0)
MCHC: 32.9 g/dL (ref 31.5–35.7)
MCV: 90 fL (ref 79–97)
Monocytes Absolute: 0.3 10*3/uL (ref 0.1–0.9)
Monocytes: 9 %
Neutrophils Absolute: 0.9 10*3/uL — ABNORMAL LOW (ref 1.4–7.0)
Neutrophils: 27 %
Platelets: 190 10*3/uL (ref 150–450)
RBC: 4.83 x10E6/uL (ref 3.77–5.28)
RDW: 12.4 % (ref 11.7–15.4)
WBC: 3.2 10*3/uL — ABNORMAL LOW (ref 3.4–10.8)

## 2019-04-10 LAB — LIPID PANEL
Chol/HDL Ratio: 4.6 ratio — ABNORMAL HIGH (ref 0.0–4.4)
Cholesterol, Total: 210 mg/dL — ABNORMAL HIGH (ref 100–199)
HDL: 46 mg/dL (ref >39)
LDL Calculated: 151 mg/dL — ABNORMAL HIGH (ref 0–99)
Triglycerides: 67 mg/dL (ref 0–149)
VLDL Cholesterol Cal: 13 mg/dL (ref 5–40)

## 2019-04-10 LAB — URIC ACID: Uric Acid: 6.1 mg/dL (ref 2.5–7.1)

## 2019-04-10 LAB — ANA: Anti Nuclear Antibody (ANA): NEGATIVE

## 2019-04-10 LAB — HEMOGLOBIN A1C
Est. average glucose Bld gHb Est-mCnc: 103 mg/dL
Hgb A1c MFr Bld: 5.2 % (ref 4.8–5.6)

## 2019-04-10 LAB — SEDIMENTATION RATE: Sed Rate: 21 mm/hr (ref 0–32)

## 2019-04-10 LAB — C-REACTIVE PROTEIN: CRP: 13 mg/L — ABNORMAL HIGH (ref 0–10)

## 2019-04-10 LAB — RHEUMATOID FACTOR: Rheumatoid fact SerPl-aCnc: <10 IU/mL (ref 0.0–13.9)

## 2019-04-10 LAB — CYCLIC CITRUL PEPTIDE ANTIBODY, IGG/IGA: Cyclic Citrullin Peptide Ab: 3 units (ref 0–19)

## 2019-04-10 LAB — VITAMIN D 25 HYDROXY (VIT D DEFICIENCY, FRACTURES): Vit D, 25-Hydroxy: 22.8 ng/mL — ABNORMAL LOW (ref 30.0–100.0)

## 2019-04-11 ENCOUNTER — Other Ambulatory Visit: Payer: Self-pay | Admitting: Internal Medicine

## 2019-04-13 ENCOUNTER — Other Ambulatory Visit: Payer: Self-pay | Admitting: Internal Medicine

## 2019-04-13 MED ORDER — MONTELUKAST SODIUM 10 MG PO TABS: 10.0000 mg | ORAL_TABLET | Freq: Every day | ORAL | 0 refills | Status: DC

## 2019-04-13 MED ORDER — MONTELUKAST SODIUM 10 MG PO TABS: 10.0000 mg | ORAL_TABLET | Freq: Every day | ORAL | 1 refills | Status: DC

## 2019-04-13 NOTE — Progress Notes (Signed)
It was not discontinued,  It expired    [facility]  This happens a lot . There must be a way to tell the difference.

## 2019-04-21 ENCOUNTER — Encounter: Payer: Self-pay | Admitting: Internal Medicine

## 2019-04-21 ENCOUNTER — Other Ambulatory Visit: Payer: Self-pay | Admitting: Internal Medicine

## 2019-04-21 NOTE — Addendum Note (Signed)
Addended by: Orland Mustard on: 04/21/2019 04:59 PM   Modules accepted: Orders

## 2019-04-30 ENCOUNTER — Telehealth: Payer: Self-pay | Admitting: Cardiovascular Disease

## 2019-04-30 NOTE — Telephone Encounter (Signed)

## 2019-05-01 NOTE — Progress Notes (Signed)
Cardiology Office Note  Date:  05/03/2019   ID:  Molly Stone, DOB [birth year], MRN [id number]  PCP:  Crecencio Mc, MD   Chief Complaint  Patient presents with  . other    3 month f/u no complaints today. Meds reviewed verbally with pt.    HPI:  Molly Stone is a 38 yo woman with PMH of  palpitations  hypertension .  Morbid obesity OSA  Not on CPAP Who presents for follow-up of her tachycardia/palpitations  She has a history of paroxysmal tachycardia On last clinic visit started on metoprolol Changed to bystolic 10 daily with propranolol as needed secondary to side effects on metoprolol  Event monitor, results discussed with her in detail Normal sinus rhythm Avg HR of 92 bpm.  1 run of Ventricular Tachycardia occurred lasting 16 beats with a max rate of 203 bpm (avg 181 bpm).  1 run of Supraventricular Tachycardiaoccurred lasting 4 beats with a max rate of 158 bpm (avg 144 bpm).  Isolated SVEs were rare (<1.0%), SVE Triplets were rare (<1.0%), and no SVE Couplets were present. Isolated VEs were rare (<1.0%), and no VE Couplets or VE Triplets were present. 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  EKG personally reviewed by myself on todays visit Shows normal sinus rhythm rate 83 bpm no significant ST or T wave changes  Previously with panic attacks Earlier in 2020 had bronchitis,   husband who working out of state in [place] area  Over the summer 2019, After eating out with alcoholic  drink, Developed faster heart rate, 146 bpm, would not slow down, Diaphoretic, 1 1/2 hours for it to go away Eventually resolved on its own  Second time, again had a alcoholic  drink, whisky, at a party Developed tachycardia again  Additional episode of tachycardia when walking in [place] It was hot, walking up the [facility] steps Had tachycardia had to sit down and recover   PMH:   has a past medical history of Allergy, Anemia, Anxiety, Asthma, Chlamydia, DDD (degenerative disc disease), cervical (04/07/2019), Disorder of vocal cord, Dysmenorrhea, Dyspareunia, female, Endometriosis, Family history of endometriosis, GERD (gastroesophageal reflux disease), Headache, Heavy periods, History of nephrolithiasis, Hypertension, Jaundice, physiologic, newborn, Obesity (BMI 30-39.9), Sleep apnea, Tobacco user, and Vaginal Pap smear, abnormal.  PSH:    Past Surgical History:  Procedure Laterality Date  . DILATATION & CURETTAGE/HYSTEROSCOPY WITH MYOSURE N/A 08/27/2018   Procedure: DILATATION & CURETTAGE/HYSTEROSCOPY WITH MINERVA;  Surgeon: Gae Dry, MD;  Location: [facility] ORS;  Service: Gynecology;  Laterality: N/A;  . DILATION AND CURETTAGE OF UTERUS    . ESOPHAGOGASTRODUODENOSCOPY (EGD) WITH PROPOFOL N/A 08/07/2018   Procedure: ESOPHAGOGASTRODUODENOSCOPY (EGD) WITH PROPOFOL;  Surgeon: Jonathon Bellows, MD;  Location: [facility] ENDOSCOPY;  Service: Gastroenterology;  Laterality: N/A;  . laparoscopy    . WISDOM TOOTH EXTRACTION      Current Outpatient Medications  Medication Sig Dispense Refill  . albuterol (PROVENTIL HFA;VENTOLIN HFA) 108 (90 Base) MCG/ACT inhaler Inhale 2 puffs into the lungs every 6 (six) hours as needed for wheezing or shortness of breath. 18 g 3  . calcium carbonate (TUMS - DOSED IN MG ELEMENTAL CALCIUM) 500 MG chewable tablet Chew 2 tablets by mouth daily.    Marland Kitchen  Cholecalciferol (VITAMIN D3) 125 MCG (5000 UT) TABS Take by mouth.    . cyclobenzaprine (FLEXERIL) 5 MG tablet Take 1 tablet (5 mg total) by mouth 3 (three) times daily as needed for muscle spasms. 30 tablet 1  . diclofenac sodium (VOLTAREN) 1 % GEL Apply 2 g topically 4 (four) times daily.  Upper body and 4 g qid lower body prn 100 g 0  . fluticasone-salmeterol (ADVAIR HFA) 230-21 MCG/ACT inhaler Inhale 2 puffs into the lungs 2 (two) times daily. 36 g 12  . montelukast (SINGULAIR) 10 MG tablet Take 1 tablet (10 mg total) by mouth at bedtime. 90 tablet 1  . Multiple Vitamin (MULTIVITAMIN WITH MINERALS) TABS tablet Take 1 tablet by mouth daily.    . nebivolol (BYSTOLIC) 10 MG tablet Take 1 tablet (10 mg total) by mouth daily. 90 tablet 3  . pantoprazole (PROTONIX) 40 MG tablet Take 1 tablet (40 mg total) by mouth 2 (two) times daily. 180 tablet 0  . propranolol (INDERAL) 20 MG tablet TAKE 1 TABLET BY MOUTH THREE TIMES DAILY AS NEEDED FOR FAST HEART RATE 90 tablet 0   No current facility-administered medications for this visit.      Allergies:   Buprenorphine hcl, Morphine and related, Prednisone, and Tramadol hcl   Social History:  The patient  reports that she quit smoking about 2 years ago. Her smoking use included cigarettes. She quit after 10.00 years of use. She has never used smokeless tobacco. She reports previous alcohol use. She reports that she does not use drugs.   Family History:   family history includes Cancer in her father; Cancer (age of onset: 25) in her maternal grandmother; Cancer (age of onset: 67) in her maternal grandfather; Diabetes in her maternal uncle; Endometriosis in her mother; Heart disease in her maternal grandfather and paternal grandmother; Lupus in her father; Osteoporosis in her maternal grandmother; Ovarian cancer in her maternal grandmother; Stroke in her father.    Review of Systems: Review of Systems  Constitutional: Negative.   Respiratory: Negative.   Cardiovascular: Positive for palpitations.       Tachycardic  Gastrointestinal: Negative.   Musculoskeletal: Negative.   Neurological: Negative.   Psychiatric/Behavioral: Negative.   All other systems reviewed and are negative.   PHYSICAL EXAM: VS:  BP 100/78 (BP Location: Left Arm,  Patient Position: Sitting, Cuff Size: Large)   Ht 5\' 9"  (1.753 m)   Wt 289 lb 8 oz (131.3 kg)   BMI 42.75 kg/m  , BMI Body mass index is 42.75 kg/m. GEN: Well nourished, well developed, in no acute distress , obese HEENT: normal  Neck: no JVD, carotid bruits, or masses Cardiac: RRR; no murmurs, rubs, or gallops,no edema  Respiratory:  clear to auscultation bilaterally, normal work of breathing GI: soft, nontender, nondistended, + BS MS: no deformity or atrophy  Skin: warm and dry, no rash Neuro:  Strength and sensation are intact Psych: euthymic mood, full affect   Recent Labs: 09/11/2018: Magnesium 2.1; TSH 0.53 04/08/2019: ALT 22; BUN 8; Creatinine, Ser 0.76; Hemoglobin 14.3; Platelets 190; Potassium 4.1; Sodium 138    Lipid Panel Lab Results  Component Value Date   CHOL 210 (H) 04/08/2019   HDL 46 04/08/2019   LDLCALC 151 (H) 04/08/2019   TRIG 67 04/08/2019      Wt Readings from Last 3 Encounters:  05/03/19 289 lb 8 oz (131.3 kg)  01/08/19 292 lb (132.5 kg)  01/02/19 294 lb (133.4 kg)     ASSESSMENT  AND PLAN:  Paroxysmal tachycardia ([place]) - Plan: EKG 12-Lead Improved symptoms on bystolic 10 mg daily She can wean down to a half a pill or off the pill if no symptoms Symptoms likely exacerbated by stressors She will continue to take propranolol as needed, she has not had to take this yet  OSA (obstructive sleep apnea) Managed by primary care Recommended weight loss, lifestyle modification  Morbid obesity ([place]) Recommended walking program, dietary changes   Total encounter time more than 25 minutes  Greater than 50% was spent in counseling and coordination of care with the patient  Follow-up as needed   Orders Placed This Encounter  Procedures  . EKG 12-Lead     Signed, Esmond Plants, M.D., Ph.D. 05/03/2019  [place], [place]

## 2019-05-03 ENCOUNTER — Encounter: Payer: Self-pay | Admitting: Cardiovascular Disease

## 2019-05-03 ENCOUNTER — Ambulatory Visit (INDEPENDENT_AMBULATORY_CARE_PROVIDER_SITE_OTHER): Payer: Commercial Managed Care - PPO | Admitting: Cardiovascular Disease

## 2019-05-03 ENCOUNTER — Other Ambulatory Visit: Payer: Self-pay

## 2019-05-03 VITALS — BP 100/78 | Ht 69.0 in | Wt 289.5 lb

## 2019-05-03 DIAGNOSIS — F419 Anxiety disorder, unspecified: Secondary | ICD-10-CM | POA: Diagnosis not present

## 2019-05-03 DIAGNOSIS — I479 Paroxysmal tachycardia, unspecified: Secondary | ICD-10-CM | POA: Diagnosis not present

## 2019-05-03 DIAGNOSIS — G4733 Obstructive sleep apnea (adult) (pediatric): Secondary | ICD-10-CM

## 2019-05-03 MED ORDER — NEBIVOLOL HCL 10 MG PO TABS: 10.0000 mg | ORAL_TABLET | Freq: Every day | ORAL | 3 refills | Status: DC

## 2019-05-03 NOTE — Patient Instructions (Signed)
Medication Instructions:  No changes  Ok to wean the bystolic down, take as needed Supplement with propranolol  If you need a refill on your cardiac medications before your next appointment, please call your pharmacy.    Lab work: No new labs needed   If you have labs (blood work) drawn today and your tests are completely normal, you will receive your results only by: Marland Kitchen MyChart Message (if you have MyChart) OR . A paper copy in the mail If you have any lab test that is abnormal or we need to change your treatment, we will call you to review the results.   Testing/Procedures: No new testing needed   Follow-Up: At [facility], you and your health needs are our priority.  As part of our continuing mission to provide you with exceptional heart care, we have created designated Provider Care Teams.  These Care Teams include your primary Cardiologist (physician) and Advanced Practice Providers (APPs -  Physician Assistants and Nurse Practitioners) who all work together to provide you with the care you need, when you need it.  . You will need a follow up appointment as needed  . Providers on your designated Care Team:   . Murray Hodgkins, NP . Christell Faith, PA-C . Marrianne Mood, PA-C  Any Other Special Instructions Will Be Listed Below (If Applicable).  For educational health videos Log in to : [email or web address] Or : [email or web address], password : [email or web address]

## 2019-05-03 NOTE — Addendum Note (Signed)
Addended by: Valora Corporal on: 05/03/2019 09:14 AM   Modules accepted: Orders

## 2019-05-11 ENCOUNTER — Encounter: Payer: Self-pay | Admitting: Cardiology

## 2019-06-21 ENCOUNTER — Ambulatory Visit (INDEPENDENT_AMBULATORY_CARE_PROVIDER_SITE_OTHER): Payer: Commercial Managed Care - PPO | Admitting: Internal Medicine

## 2019-06-21 ENCOUNTER — Other Ambulatory Visit: Payer: Self-pay

## 2019-06-21 ENCOUNTER — Other Ambulatory Visit: Payer: Self-pay | Admitting: Gastroenterology

## 2019-06-21 ENCOUNTER — Encounter: Payer: Self-pay | Admitting: Internal Medicine

## 2019-06-21 VITALS — Ht 69.0 in | Wt 274.5 lb

## 2019-06-21 DIAGNOSIS — R1012 Left upper quadrant pain: Secondary | ICD-10-CM | POA: Diagnosis not present

## 2019-06-21 DIAGNOSIS — R109 Unspecified abdominal pain: Secondary | ICD-10-CM

## 2019-06-21 DIAGNOSIS — F419 Anxiety disorder, unspecified: Secondary | ICD-10-CM | POA: Diagnosis not present

## 2019-06-21 DIAGNOSIS — M79661 Pain in right lower leg: Secondary | ICD-10-CM | POA: Diagnosis not present

## 2019-06-21 NOTE — Assessment & Plan Note (Signed)
Her anxiety appears to be improved since starting an exercise program and losing weight .  No changes to therapy needed at this point.

## 2019-06-21 NOTE — Progress Notes (Signed)
Virtual Visit via [email or web address]  This visit type was conducted due to national recommendations for restrictions regarding the COVID-19 pandemic (e.g. social distancing).  This format is felt to be most appropriate for this patient at this time.  All issues noted in this document were discussed and addressed.  No physical exam was performed (except for noted visual exam findings with Video Visits).   I connected with@ on 06/21/19 at 11:00 AM EDT by a video enabled telemedicine application or telephone and verified that I am speaking with the correct person using two identifiers. Location patient: home Location provider: work or home office Persons participating in the virtual visit: patient, provider  I discussed the limitations, risks, security and privacy concerns of performing an evaluation and management service by telephone and the availability of in person appointments. I also discussed with the patient that there may be a patient responsible charge related to this service. The patient expressed understanding and agreed to proceed.   Reason for visit: left sided abdominal pain x 2 days  HPI:  38 yr old female with GAD, morbid obesity presents at request of husband for evaluation of crampy  Intermittent LUQ pain for the past 2 days . She denies diarrhea, hematuria , nausea and chills.   Imaging reviewed:  Normal CT ab pelvis Nov 2019,  History of 3 mm Right UVJ stone 2018  Resolved by June 2019 CT .   Has been losing weight steadily through Weight Watchers Program (23 lbs in 3 weeks)  And regular exercise done at home with new home gym .  Having 3 to 4 formed stools daily   Follow up on Left shoulder pain.  Sent to rheum for elevated CRP  , seen July 21 , myositis considered labs ordered and reportedly negative per patient.  Pain has improved,  Left arm no longer hurts all the time>usefulness of cervical  spine MRI discussed,  Jointly decided to defer for now.   RoS: See pertinent positives and  negatives per HPI.  Past Medical History:  Diagnosis Date  . Allergy   . Anemia   . Anxiety   . Asthma   . Chlamydia    h/o  . DDD (degenerative disc disease), cervical 04/07/2019  . Disorder of vocal cord    spasmodic dysphonia  . Dysmenorrhea   . Dyspareunia, female   . Endometriosis   . Family history of endometriosis   . GERD (gastroesophageal reflux disease)   . Headache    migraine  . Heavy periods   . History of nephrolithiasis   . Hypertension   . Jaundice, physiologic, newborn   . Obesity (BMI 30-39.9)   . Sleep apnea   . Tobacco user   . Vaginal Pap smear, abnormal     Past Surgical History:  Procedure Laterality Date  . DILATATION & CURETTAGE/HYSTEROSCOPY WITH MYOSURE N/A 08/27/2018   Procedure: DILATATION & CURETTAGE/HYSTEROSCOPY WITH MINERVA;  Surgeon: Gae Dry, MD;  Location: [facility] ORS;  Service: Gynecology;  Laterality: N/A;  . DILATION AND CURETTAGE OF UTERUS    . ESOPHAGOGASTRODUODENOSCOPY (EGD) WITH PROPOFOL N/A 08/07/2018   Procedure: ESOPHAGOGASTRODUODENOSCOPY (EGD) WITH PROPOFOL;  Surgeon: Jonathon Bellows, MD;  Location: [facility] ENDOSCOPY;  Service: Gastroenterology;  Laterality: N/A;  . laparoscopy    . WISDOM TOOTH EXTRACTION      Family History  Problem Relation Age of Onset  . Endometriosis Mother   . Cancer Father        benign liver CA  . Stroke Father  recurrent blood clots on the brain   . Lupus Father   . Cancer Maternal Grandmother 30       ovarian Ca, still surviving   . Osteoporosis Maternal Grandmother   . Ovarian cancer Maternal Grandmother   . Heart disease Maternal Grandfather   . Cancer Maternal Grandfather 41       colon CA. metastatic  . Diabetes Maternal Uncle   . Heart disease Paternal Grandmother   . Breast cancer Neg Hx   . Colon cancer Neg Hx   . Kidney cancer Neg Hx   . Bladder Cancer Neg Hx     SOCIAL HX:  reports that she quit smoking about 2 years ago. Her smoking use included cigarettes. She quit  after 10.00 years of use. She has never used smokeless tobacco. She reports previous alcohol use. She reports that she does not use drugs.   Current Outpatient Medications:  .  albuterol (PROVENTIL HFA;VENTOLIN HFA) 108 (90 Base) MCG/ACT inhaler, Inhale 2 puffs into the lungs every 6 (six) hours as needed for wheezing or shortness of breath., Disp: 18 g, Rfl: 3 .  calcium carbonate (TUMS - DOSED IN MG ELEMENTAL CALCIUM) 500 MG chewable tablet, Chew 2 tablets by mouth daily., Disp: , Rfl:  .  Cholecalciferol (VITAMIN D3) 125 MCG (5000 UT) TABS, Take by mouth., Disp: , Rfl:  .  fluticasone-salmeterol (ADVAIR HFA) [id number] MCG/ACT inhaler, Inhale 2 puffs into the lungs 2 (two) times daily., Disp: 36 g, Rfl: 12 .  montelukast (SINGULAIR) 10 MG tablet, Take 1 tablet (10 mg total) by mouth at bedtime., Disp: 90 tablet, Rfl: 1 .  Multiple Vitamin (MULTIVITAMIN WITH MINERALS) TABS tablet, Take 1 tablet by mouth daily., Disp: , Rfl:  .  nebivolol (BYSTOLIC) 10 MG tablet, Take 1 tablet (10 mg total) by mouth daily. (Patient taking differently: Take 10 mg by mouth daily. ), Disp: 90 tablet, Rfl: 3 .  pantoprazole (PROTONIX) 40 MG tablet, Take 1 tablet (40 mg total) by mouth 2 (two) times daily., Disp: 180 tablet, Rfl: 0 .  propranolol (INDERAL) 20 MG tablet, TAKE 1 TABLET BY MOUTH THREE TIMES DAILY AS NEEDED FOR FAST HEART RATE (Patient not taking: Reported on 06/21/2019), Disp: 90 tablet, Rfl: 0  EXAM:  VITALS per patient if applicable:  GENERAL: alert, oriented, appears well and in no acute distress  HEENT: atraumatic, conjunttiva clear, no obvious abnormalities on inspection of external nose and ears  NECK: normal movements of the head and neck  LUNGS: on inspection no signs of respiratory distress, breathing rate appears normal, no obvious gross SOB, gasping or wheezing  CV: no obvious cyanosis  MS: moves all visible extremities without noticeable abnormality  PSYCH/NEURO: pleasant and  cooperative, no obvious depression or anxiety, speech and thought processing grossly intact  ASSESSMENT AND PLAN:  Colicky LUQ abdominal pain ddx includes left sided kidney stone,  IBS,  And MSK strain.  Urinalysis ordered.  Water intake reviewed. For muscle strain  Heat/ice prn  Right calf pain ER records reviewed.  DVT ruled out   Anxiety Her anxiety appears to be improved since starting an exercise program and losing weight .  No changes to therapy needed at this point.    I discussed the assessment and treatment plan with the patient. The patient was provided an opportunity to ask questions and all were answered. The patient agreed with the plan and demonstrated an understanding of the instructions.   The patient was advised to call back or  seek an in-person evaluation if the symptoms worsen or if the condition fails to improve as anticipated.  A total of 25 minutes was spent in [place] with patient more than half of which was spent in counseling patient on the above mentioned issues , reviewing and explaining recent labs and imaging studies done, durig her last 2 ER visits,  recent rheumatology evaluation  and coordination of care.  Crecencio Mc, MD

## 2019-06-21 NOTE — Assessment & Plan Note (Signed)
ddx includes left sided kidney stone,  IBS,  And MSK strain.  Urinalysis ordered.  Water intake reviewed. For muscle strain  Heat/ice prn

## 2019-06-21 NOTE — Assessment & Plan Note (Signed)
ER records reviewed.  DVT ruled out

## 2019-06-23 ENCOUNTER — Other Ambulatory Visit: Payer: Self-pay

## 2019-06-23 MED ORDER — NEBIVOLOL HCL 10 MG PO TABS: 10.0000 mg | ORAL_TABLET | Freq: Every day | ORAL | 3 refills | Status: DC

## 2019-07-08 ENCOUNTER — Other Ambulatory Visit: Payer: Self-pay

## 2019-07-08 ENCOUNTER — Telehealth: Payer: Self-pay | Admitting: Internal Medicine

## 2019-07-08 DIAGNOSIS — R5383 Other fatigue: Secondary | ICD-10-CM

## 2019-07-08 DIAGNOSIS — R5381 Other malaise: Secondary | ICD-10-CM

## 2019-07-08 NOTE — Telephone Encounter (Signed)
Pt has a follow up on 07/12/2019 at 4pm. Pt thinks she needs labs done, no orders are in. Let me know if she needs labs. Thanks.

## 2019-07-08 NOTE — Telephone Encounter (Signed)
Does pt need any labs before her appt on 07/12/2019?

## 2019-07-09 NOTE — Telephone Encounter (Signed)
No, ill wait until I see her

## 2019-07-09 NOTE — Telephone Encounter (Signed)
Patient called & notified that it will be in office. Labs will be done then.

## 2019-07-12 ENCOUNTER — Other Ambulatory Visit: Payer: Self-pay

## 2019-07-12 ENCOUNTER — Ambulatory Visit: Payer: Commercial Managed Care - PPO | Admitting: Internal Medicine

## 2019-07-12 ENCOUNTER — Encounter: Payer: Self-pay | Admitting: Internal Medicine

## 2019-07-12 DIAGNOSIS — D708 Other neutropenia: Secondary | ICD-10-CM | POA: Diagnosis not present

## 2019-07-12 DIAGNOSIS — D709 Neutropenia, unspecified: Secondary | ICD-10-CM | POA: Insufficient documentation

## 2019-07-12 DIAGNOSIS — R5381 Other malaise: Secondary | ICD-10-CM | POA: Diagnosis not present

## 2019-07-12 DIAGNOSIS — R1012 Left upper quadrant pain: Secondary | ICD-10-CM | POA: Diagnosis not present

## 2019-07-12 DIAGNOSIS — R5383 Other fatigue: Secondary | ICD-10-CM | POA: Diagnosis not present

## 2019-07-12 DIAGNOSIS — I471 Supraventricular tachycardia, unspecified: Secondary | ICD-10-CM

## 2019-07-12 NOTE — Patient Instructions (Signed)
You are doing FANTASTIC !!  Keep up the great job you are doing  60 to 80 ounces of water daily is PLENTY   I'll see you in 6 months

## 2019-07-12 NOTE — Progress Notes (Signed)
Subjective:  Patient ID: Molly Stone, female    DOB: 03/01/1981  Age: 38 y.o. MRN: [id number]  CC: Diagnoses of Malaise and fatigue, Morbid obesity ([place]), Other neutropenia ([place]), Colicky LUQ abdominal pain, and SVT (supraventricular tachycardia) ([place]) were pertinent to this visit.  HPI Molly Stone presents for FOLLOW UP ON multiple issues   Left sided abd pain : never had the U A ordered on August 31.  Her pain resolved with resolution of constipation using double doses of metamucil metamucil)     25 lb wt loss using Weight Watcher's plan and exercising. Very motivated to continue .   Saw [facility] for neck pain after seeing rheumatology . Using voltaren gel on neck and shoulder , becomes fearful when she develops pain.  Still having Posterior neck,  left shoulder and right hip pain (hip pain occurred  following a recent fall onto floor),  With bruising  Now resolved  Has rosacea,  Sees dermatology for acne caused by mask ; clindamycin -retina A compounded cream   Sleeping 8-9 hours daily . Energy level has improved since toprol XL was stopped and Bystolic was started, dose reduced to 5 mg at follow up for  PAT started by [facility]   Outpatient Medications Prior to Visit  Medication Sig Dispense Refill  . albuterol (PROVENTIL HFA;VENTOLIN HFA) 108 (90 Base) MCG/ACT inhaler Inhale 2 puffs into the lungs every 6 (six) hours as needed for wheezing or shortness of breath. 18 g 3  . Cholecalciferol (VITAMIN D3) 125 MCG (5000 UT) TABS Take by mouth.    . famotidine (PEPCID) 20 MG tablet Take 20 mg by mouth 2 (two) times daily.    . fluticasone-salmeterol (ADVAIR HFA) 230-21 MCG/ACT inhaler Inhale 2 puffs into the lungs 2 (two) times daily. 36 g 12  . montelukast (SINGULAIR) 10 MG tablet Take 1 tablet (10 mg total) by mouth at bedtime. 90 tablet 1  . Multiple Vitamin (MULTIVITAMIN WITH MINERALS) TABS tablet Take 1 tablet by mouth daily.    . nebivolol (BYSTOLIC) 10 MG tablet Take 1  tablet (10 mg total) by mouth daily. (Patient taking differently: Take 5 mg by mouth daily. ) 90 tablet 3  . pantoprazole (PROTONIX) 40 MG tablet TAKE 1 TABLET TWICE A DAY 180 tablet 3  . propranolol (INDERAL) 20 MG tablet TAKE 1 TABLET BY MOUTH THREE TIMES DAILY AS NEEDED FOR FAST HEART RATE (Patient not taking: Reported on 06/21/2019) 90 tablet 0  . calcium carbonate (TUMS - DOSED IN MG ELEMENTAL CALCIUM) 500 MG chewable tablet Chew 2 tablets by mouth daily.     No facility-administered medications prior to visit.     Review of Systems;  Patient denies headache, fevers, malaise, unintentional weight loss, skin rash, eye pain, sinus congestion and sinus pain, sore throat, dysphagia,  hemoptysis , cough, dyspnea, wheezing, chest pain, palpitations, orthopnea, edema, abdominal pain, nausea, melena, diarrhea, constipation, flank pain, dysuria, hematuria, urinary  Frequency, nocturia, numbness, tingling, seizures,  Focal weakness, Loss of consciousness,  Tremor, insomnia, depression, anxiety, and suicidal ideation.      Objective:  BP 110/72   Pulse 87   Temp 98.6 F (37 C)   Wt 273 lb (123.8 kg)   SpO2 96%   BMI 40.32 kg/m   BP Readings from Last 3 Encounters:  07/12/19 110/72  05/03/19 100/78  03/21/19 121/61    Wt Readings from Last 3 Encounters:  07/12/19 273 lb (123.8 kg)  06/21/19 274 lb 8 oz (124.5  kg)  05/03/19 289 lb 8 oz (131.3 kg)    General appearance: alert, cooperative and appears stated age Ears: normal TM's and external ear canals both ears Throat: lips, mucosa, and tongue normal; teeth and gums normal Neck: no adenopathy, no carotid bruit, supple, symmetrical, trachea midline and thyroid not enlarged, symmetric, no tenderness/mass/nodules Back: symmetric, no curvature. ROM normal. No CVA tenderness. Lungs: clear to auscultation bilaterally Heart: regular rate and rhythm, S1, S2 normal, no murmur, click, rub or gallop Abdomen: soft, non-tender; bowel sounds  normal; no masses,  no organomegaly Pulses: 2+ and symmetric Skin: Skin color, texture, turgor normal. No rashes or lesions Lymph nodes: Cervical, supraclavicular, and axillary nodes normal.  Lab Results  Component Value Date   HGBA1C 5.2 04/08/2019    Lab Results  Component Value Date   CREATININE 0.76 04/08/2019   CREATININE 0.83 12/09/2018   CREATININE 0.70 09/17/2018    Lab Results  Component Value Date   WBC 3.2 (L) 04/08/2019   HGB 14.3 04/08/2019   HCT 43.4 04/08/2019   PLT 190 04/08/2019   GLUCOSE 101 (H) 04/08/2019   CHOL 210 (H) 04/08/2019   TRIG 67 04/08/2019   HDL 46 04/08/2019   LDLDIRECT 155 (H) 08/18/2015   LDLCALC 151 (H) 04/08/2019   ALT 22 04/08/2019   AST 18 04/08/2019   NA 138 04/08/2019   K 4.1 04/08/2019   CL 102 04/08/2019   CREATININE 0.76 04/08/2019   BUN 8 04/08/2019   CO2 23 04/08/2019   TSH 0.53 09/11/2018   HGBA1C 5.2 04/08/2019    US Venous Img Lower Unilateral Right  Result Date: 03/21/2019 CLINICAL DATA:  RIGHT leg pain EXAM: RIGHT LOWER EXTREMITY VENOUS DOPPLER ULTRASOUND TECHNIQUE: Gray-scale sonography with graded compression, as well as color Doppler and duplex ultrasound were performed to evaluate the lower extremity deep venous systems from the level of the common femoral vein and including the common femoral, femoral, profunda femoral, popliteal and calf veins including the posterior tibial, peroneal and gastrocnemius veins when visible. The superficial great saphenous vein was also interrogated. Spectral Doppler was utilized to evaluate flow at rest and with distal augmentation maneuvers in the common femoral, femoral and popliteal veins. COMPARISON:  None. FINDINGS: Contralateral Common Femoral Vein: Respiratory phasicity is normal and symmetric with the symptomatic side. No evidence of thrombus. Normal compressibility. Common Femoral Vein: No evidence of thrombus. Normal compressibility, respiratory phasicity and response to  augmentation. Saphenofemoral Junction: No evidence of thrombus. Normal compressibility and flow on color Doppler imaging. Profunda Femoral Vein: No evidence of thrombus. Normal compressibility and flow on color Doppler imaging. Femoral Vein: No evidence of thrombus. Normal compressibility, respiratory phasicity and response to augmentation. Popliteal Vein: No evidence of thrombus. Normal compressibility, respiratory phasicity and response to augmentation. Calf Veins: No evidence of thrombus. Normal compressibility and flow on color Doppler imaging. Superficial Great Saphenous Vein: No evidence of thrombus. Normal compressibility. IMPRESSION: No evidence of deep venous thrombosis. Electronically Signed   By: Suzy Bouchard M.D.   On: 03/21/2019 16:43    Assessment & Plan:   Problem List Items Addressed This Visit      Unprioritized   RESOLVED: Malaise and fatigue   Morbid obesity ([place])    I have congratulated her in reduction of   BMI and encouraged  Continued weight loss with goal of 10% of body weigh over the next 6 months using a low glycemic index diet and regular exercise a minimum of 5 days per week.  SVT (supraventricular tachycardia) ([facility])    Managed with Bystolic 5 mg daily       Colicky LUQ abdominal pain    Secondary to constipation, now resolved with use of metamucil       Neutropenia ([facility])    Mild, noted recently of new onset repeating today .  No history of recurrent infections         A total of 25 minutes of face to face time was spent with patient more than half of which was spent in counselling about the above mentioned conditions  and coordination of care    I have discontinued Bo M. Wolters's calcium carbonate. I am also having her maintain her albuterol, multivitamin with minerals, propranolol, fluticasone-salmeterol, Vitamin D3, montelukast, pantoprazole, nebivolol, and famotidine.  No orders of the defined types were placed in this encounter.    Medications Discontinued During This Encounter  Medication Reason  . calcium carbonate (TUMS - DOSED IN MG ELEMENTAL CALCIUM) 500 MG chewable tablet Change in therapy    Follow-up: No follow-ups on file.   Crecencio Mc, MD

## 2019-07-12 NOTE — Assessment & Plan Note (Signed)
Secondary to constipation, now resolved with use of metamucil

## 2019-07-12 NOTE — Assessment & Plan Note (Signed)
Mild, noted recently of new onset repeating today .  No history of recurrent infections

## 2019-07-12 NOTE — Assessment & Plan Note (Signed)
I have congratulated her in reduction of   BMI and encouraged  Continued weight loss with goal of 10% of body weigh over the next 6 months using a low glycemic index diet and regular exercise a minimum of 5 days per week.    

## 2019-07-12 NOTE — Assessment & Plan Note (Signed)
Managed with Bystolic 5 mg daily

## 2019-07-13 LAB — CBC WITH DIFFERENTIAL/PLATELET
Basophils Absolute: 0 10*3/uL (ref 0.0–0.1)
Basophils Relative: 0.8 % (ref 0.0–3.0)
Eosinophils Absolute: 0.1 10*3/uL (ref 0.0–0.7)
Eosinophils Relative: 3.2 % (ref 0.0–5.0)
HCT: 41.9 % (ref 36.0–46.0)
Hemoglobin: 14.1 g/dL (ref 12.0–15.0)
Lymphocytes Relative: 48.5 % — ABNORMAL HIGH (ref 12.0–46.0)
Lymphs Abs: 2 10*3/uL (ref 0.7–4.0)
MCHC: 33.7 g/dL (ref 30.0–36.0)
MCV: 91.1 fl (ref 78.0–100.0)
Monocytes Absolute: 0.6 10*3/uL (ref 0.1–1.0)
Monocytes Relative: 16 % — ABNORMAL HIGH (ref 3.0–12.0)
Neutro Abs: 1.3 10*3/uL — ABNORMAL LOW (ref 1.4–7.7)
Neutrophils Relative %: 31.5 % — ABNORMAL LOW (ref 43.0–77.0)
Platelets: 187 10*3/uL (ref 150.0–400.0)
RBC: 4.59 Mil/uL (ref 3.87–5.11)
RDW: 13 % (ref 11.5–15.5)
WBC: 4 10*3/uL (ref 4.0–10.5)

## 2019-08-03 MED ORDER — ALBUTEROL SULFATE HFA 108 (90 BASE) MCG/ACT IN AERS: 2.0000 | INHALATION_SPRAY | Freq: Four times a day (QID) | RESPIRATORY_TRACT | 3 refills | Status: DC | PRN

## 2019-08-09 ENCOUNTER — Encounter: Payer: Self-pay | Admitting: Obstetrics & Gynecology

## 2019-08-09 ENCOUNTER — Ambulatory Visit (INDEPENDENT_AMBULATORY_CARE_PROVIDER_SITE_OTHER): Payer: Commercial Managed Care - PPO | Admitting: Obstetrics & Gynecology

## 2019-08-09 ENCOUNTER — Other Ambulatory Visit: Payer: Self-pay

## 2019-08-09 VITALS — BP 120/80 | Ht 69.0 in | Wt 266.0 lb

## 2019-08-09 DIAGNOSIS — Z01419 Encounter for gynecological examination (general) (routine) without abnormal findings: Secondary | ICD-10-CM | POA: Diagnosis not present

## 2019-08-09 NOTE — Progress Notes (Signed)
HPI:      Molly Stone is a 38 y.o. [phone] who LMP was Patient's last menstrual period was 07/23/2019., she presents today for her annual examination. The patient has no complaints today. Despite ablation last year, she still has somewhat heavy periods monthly w premenstrual pain for 2 days. The patient is sexually active. Her last pap: approximate date 2019 and was normal. The patient does perform self breast exams.  There is no notable family history of breast or ovarian cancer in her family.  The patient has regular exercise: yes.  She has recently lost 45 lbs, trying to lose more.  The patient denies current symptoms of depression.    GYN History: Contraception: vasectomy  PMHx: Past Medical History:  Diagnosis Date  . Allergy   . Anemia   . Anxiety   . Asthma   . Chlamydia    h/o  . DDD (degenerative disc disease), cervical 04/07/2019  . Disorder of vocal cord    spasmodic dysphonia  . Dysmenorrhea   . Dyspareunia, female   . Endometriosis   . Family history of endometriosis   . GERD (gastroesophageal reflux disease)   . Headache    migraine  . Heavy periods   . History of nephrolithiasis   . Hypertension   . Jaundice, physiologic, newborn   . Obesity (BMI 30-39.9)   . Sleep apnea   . Tobacco user   . Vaginal Pap smear, abnormal    Past Surgical History:  Procedure Laterality Date  . DILATATION & CURETTAGE/HYSTEROSCOPY WITH MYOSURE N/A 08/27/2018   Procedure: DILATATION & CURETTAGE/HYSTEROSCOPY WITH MINERVA;  Surgeon: Gae Dry, MD;  Location: [facility] ORS;  Service: Gynecology;  Laterality: N/A;  . DILATION AND CURETTAGE OF UTERUS    . ESOPHAGOGASTRODUODENOSCOPY (EGD) WITH PROPOFOL N/A 08/07/2018   Procedure: ESOPHAGOGASTRODUODENOSCOPY (EGD) WITH PROPOFOL;  Surgeon: Jonathon Bellows, MD;  Location: [facility] ENDOSCOPY;  Service: Gastroenterology;  Laterality: N/A;  . laparoscopy    . ROOT CANAL    . WISDOM TOOTH EXTRACTION     Family History  Problem Relation  Age of Onset  . Endometriosis Mother   . Cancer Father        benign liver CA  . Stroke Father        recurrent blood clots on the brain   . Lupus Father   . Cancer Maternal Grandmother 30       ovarian Ca, still surviving   . Osteoporosis Maternal Grandmother   . Ovarian cancer Maternal Grandmother   . Heart disease Maternal Grandfather   . Cancer Maternal Grandfather 6       colon CA. metastatic  . Diabetes Maternal Uncle   . Heart disease Paternal Grandmother   . Breast cancer Neg Hx   . Colon cancer Neg Hx   . Kidney cancer Neg Hx   . Bladder Cancer Neg Hx    Social History   Tobacco Use  . Smoking status: Former Smoker    Years: 10.00    Types: Cigarettes    Quit date: 10/2016    Years since quitting: 2.8  . Smokeless tobacco: Never Used  Substance Use Topics  . Alcohol use: Not Currently    Alcohol/week: 0.0 standard drinks  . Drug use: No    Current Outpatient Medications:  .  albuterol (VENTOLIN HFA) 108 (90 Base) MCG/ACT inhaler, Inhale 2 puffs into the lungs every 6 (six) hours as needed for wheezing or shortness of breath., Disp: 18 g,  Rfl: 3 .  Cholecalciferol (VITAMIN D3) 125 MCG (5000 UT) TABS, Take by mouth., Disp: , Rfl:  .  montelukast (SINGULAIR) 10 MG tablet, Take 1 tablet (10 mg total) by mouth at bedtime., Disp: 90 tablet, Rfl: 1 .  nebivolol (BYSTOLIC) 10 MG tablet, Take 1 tablet (10 mg total) by mouth daily. (Patient taking differently: Take 5 mg by mouth daily. ), Disp: 90 tablet, Rfl: 3 .  pantoprazole (PROTONIX) 40 MG tablet, TAKE 1 TABLET TWICE A DAY, Disp: 180 tablet, Rfl: 3 .  famotidine (PEPCID) 20 MG tablet, Take 20 mg by mouth 2 (two) times daily., Disp: , Rfl:  .  fluticasone-salmeterol (ADVAIR HFA) 230-21 MCG/ACT inhaler, Inhale 2 puffs into the lungs 2 (two) times daily., Disp: 36 g, Rfl: 12 .  Multiple Vitamin (MULTIVITAMIN WITH MINERALS) TABS tablet, Take 1 tablet by mouth daily., Disp: , Rfl:  .  propranolol (INDERAL) 20 MG tablet,  TAKE 1 TABLET BY MOUTH THREE TIMES DAILY AS NEEDED FOR FAST HEART RATE (Patient not taking: Reported on 06/21/2019), Disp: 90 tablet, Rfl: 0 Allergies: Buprenorphine hcl, Morphine and related, Prednisone, and Tramadol hcl  Review of Systems  Constitutional: Negative for chills, fever and malaise/fatigue.  HENT: Negative for congestion, sinus pain and sore throat.   Eyes: Negative for blurred vision and pain.  Respiratory: Negative for cough and wheezing.   Cardiovascular: Negative for chest pain and leg swelling.  Gastrointestinal: Negative for abdominal pain, constipation, diarrhea, heartburn, nausea and vomiting.  Genitourinary: Negative for dysuria, frequency, hematuria and urgency.  Musculoskeletal: Negative for back pain, joint pain, myalgias and neck pain.  Skin: Negative for itching and rash.  Neurological: Negative for dizziness, tremors and weakness.  Endo/Heme/Allergies: Does not bruise/bleed easily.  Psychiatric/Behavioral: Negative for depression. The patient is not nervous/anxious and does not have insomnia.     Objective: BP 120/80   Ht 5\' 9"  (1.753 m)   Wt 266 lb (120.7 kg)   LMP 07/23/2019   BMI 39.28 kg/m   Filed Weights   08/09/19 1324  Weight: 266 lb (120.7 kg)   Body mass index is 39.28 kg/m. Physical Exam Constitutional:      General: She is not in acute distress.    Appearance: She is well-developed.  Genitourinary:     Pelvic exam was performed with patient supine.     Vagina, uterus and rectum normal.     No lesions in the vagina.     No vaginal bleeding.     No cervical motion tenderness, friability, lesion or polyp.     Uterus is mobile.     Uterus is not enlarged.     No uterine mass detected.    Uterus is midaxial.     No right or left adnexal mass present.     Right adnexa not tender.     Left adnexa not tender.  HENT:     Head: Normocephalic and atraumatic. No laceration.     Right Ear: Hearing normal.     Left Ear: Hearing normal.      Mouth/Throat:     Pharynx: Uvula midline.  Eyes:     Pupils: Pupils are equal, round, and reactive to light.  Neck:     Musculoskeletal: Normal range of motion and neck supple.     Thyroid: No thyromegaly.  Cardiovascular:     Rate and Rhythm: Normal rate and regular rhythm.     Heart sounds: No murmur. No friction rub. No gallop.   Pulmonary:  Effort: Pulmonary effort is normal. No respiratory distress.     Breath sounds: Normal breath sounds. No wheezing.  Chest:     Breasts:        Right: No mass, skin change or tenderness.        Left: No mass, skin change or tenderness.  Abdominal:     General: Bowel sounds are normal. There is no distension.     Palpations: Abdomen is soft.     Tenderness: There is no abdominal tenderness. There is no rebound.  Musculoskeletal: Normal range of motion.  Neurological:     Mental Status: She is alert and oriented to person, place, and time.     Cranial Nerves: No cranial nerve deficit.  Skin:    General: Skin is warm and dry.  Psychiatric:        Judgment: Judgment normal.  Vitals signs reviewed.     Assessment:  ANNUAL EXAM 1. Women's annual routine gynecological examination              1.  Cervical Screening-  Pap smear schedule reviewed with patient, due every three years (2022)  2. Breast screening- Exam annually and mammogram>40 planned   3. Labs managed by PCP  4. Counseling for contraception: vasectomy   5. Options for management of menorrhagia and dysmenorrhea d/w pt, at this point hysterectomy (TLH) is options to consider with its pros and cons discussed today. She will continue with weight loss and monitor sx's for the next few months.     F/U  Return in about 1 year (around 08/08/2020) for Annual.  Barnett Applebaum, MD, [facility], [facility] 08/09/2019  1:46 PM

## 2019-08-16 ENCOUNTER — Encounter: Payer: Self-pay | Admitting: Internal Medicine

## 2019-08-16 ENCOUNTER — Ambulatory Visit (INDEPENDENT_AMBULATORY_CARE_PROVIDER_SITE_OTHER): Payer: Commercial Managed Care - PPO | Admitting: Internal Medicine

## 2019-08-16 ENCOUNTER — Other Ambulatory Visit: Payer: Self-pay

## 2019-08-16 DIAGNOSIS — J40 Bronchitis, not specified as acute or chronic: Secondary | ICD-10-CM | POA: Diagnosis not present

## 2019-08-16 MED ORDER — DOXYCYCLINE HYCLATE 100 MG PO TABS: 100.0000 mg | ORAL_TABLET | Freq: Two times a day (BID) | ORAL | 0 refills | Status: DC

## 2019-08-16 MED ORDER — PREDNISONE 10 MG PO TABS: ORAL_TABLET | ORAL | 0 refills | Status: DC

## 2019-08-16 NOTE — Progress Notes (Signed)
Virtual Visit via [email or web address]  This visit type was conducted due to national recommendations for restrictions regarding the COVID-19 pandemic (e.g. social distancing).  This format is felt to be most appropriate for this patient at this time.  All issues noted in this document were discussed and addressed.  No physical exam was performed (except for noted visual exam findings with Video Visits).   I connected with@ on 08/16/19 at  4:30 PM EDT by a video enabled telemedicine application  and verified that I am speaking with the correct person using two identifiers. Location patient: home Location provider: work or home office Persons participating in the virtual visit: patient, provider  I discussed the limitations, risks, security and privacy concerns of performing an evaluation and management service by telephone and the availability of in person appointments. I also discussed with the patient that there may be a patient responsible charge related to this service. The patient expressed understanding and agreed to proceed.  Reason for visit: cough HPI:  38 yr old female with history of bronchitis presents with scratchy throat which started last week , attributed to pollen  Went to [facility] for  A long weekend with [place],  Socially distanced and masked and developed incresae cough over the last 24 hours,  No dyspnea,  Body aches or fever.   Mild fatigue  ROS: See pertinent positives and negatives per HPI.  Past Medical History:  Diagnosis Date  . Allergy   . Anemia   . Anxiety   . Asthma   . Chlamydia    h/o  . DDD (degenerative disc disease), cervical 04/07/2019  . Disorder of vocal cord    spasmodic dysphonia  . Dysmenorrhea   . Dyspareunia, female   . Endometriosis   . Family history of endometriosis   . GERD (gastroesophageal reflux disease)   . Headache    migraine  . Heavy periods   . History of nephrolithiasis   . Hypertension   . Jaundice, physiologic, newborn   .  Obesity (BMI 30-39.9)   . Sleep apnea   . Tobacco user   . Vaginal Pap smear, abnormal     Past Surgical History:  Procedure Laterality Date  . DILATATION & CURETTAGE/HYSTEROSCOPY WITH MYOSURE N/A 08/27/2018   Procedure: DILATATION & CURETTAGE/HYSTEROSCOPY WITH MINERVA;  Surgeon: Gae Dry, MD;  Location: [facility] ORS;  Service: Gynecology;  Laterality: N/A;  . DILATION AND CURETTAGE OF UTERUS    . ESOPHAGOGASTRODUODENOSCOPY (EGD) WITH PROPOFOL N/A 08/07/2018   Procedure: ESOPHAGOGASTRODUODENOSCOPY (EGD) WITH PROPOFOL;  Surgeon: Jonathon Bellows, MD;  Location: [facility] ENDOSCOPY;  Service: Gastroenterology;  Laterality: N/A;  . laparoscopy    . ROOT CANAL    . WISDOM TOOTH EXTRACTION      Family History  Problem Relation Age of Onset  . Endometriosis Mother   . Cancer Father        benign liver CA  . Stroke Father        recurrent blood clots on the brain   . Lupus Father   . Cancer Maternal Grandmother 30       ovarian Ca, still surviving   . Osteoporosis Maternal Grandmother   . Ovarian cancer Maternal Grandmother   . Heart disease Maternal Grandfather   . Cancer Maternal Grandfather 82       colon CA. metastatic  . Diabetes Maternal Uncle   . Heart disease Paternal Grandmother   . Breast cancer Neg Hx   . Colon cancer Neg Hx   .  Kidney cancer Neg Hx   . Bladder Cancer Neg Hx     SOCIAL HX:  reports that she quit smoking about 2 years ago. Her smoking use included cigarettes. She quit after 10.00 years of use. She has never used smokeless tobacco. She reports previous alcohol use. She reports that she does not use drugs.   Current Outpatient Medications:  .  albuterol (VENTOLIN HFA) 108 (90 Base) MCG/ACT inhaler, Inhale 2 puffs into the lungs every 6 (six) hours as needed for wheezing or shortness of breath., Disp: 18 g, Rfl: 3 .  Cholecalciferol (VITAMIN D3) 125 MCG (5000 UT) TABS, Take by mouth., Disp: , Rfl:  .  clindamycin-tretinoin (ZIANA) gel, , Disp: , Rfl:  .   famotidine (PEPCID) 20 MG tablet, Take 20 mg by mouth 2 (two) times daily., Disp: , Rfl:  .  fluticasone-salmeterol (ADVAIR HFA) 230-21 MCG/ACT inhaler, Inhale 2 puffs into the lungs 2 (two) times daily., Disp: 36 g, Rfl: 12 .  montelukast (SINGULAIR) 10 MG tablet, Take 1 tablet (10 mg total) by mouth at bedtime., Disp: 90 tablet, Rfl: 1 .  Multiple Vitamin (MULTIVITAMIN WITH MINERALS) TABS tablet, Take 1 tablet by mouth daily., Disp: , Rfl:  .  nebivolol (BYSTOLIC) 10 MG tablet, Take 1 tablet (10 mg total) by mouth daily. (Patient taking differently: Take 5 mg by mouth daily. ), Disp: 90 tablet, Rfl: 3 .  pantoprazole (PROTONIX) 40 MG tablet, TAKE 1 TABLET TWICE A DAY, Disp: 180 tablet, Rfl: 3 .  RHOFADE 1 % CREA, , Disp: , Rfl:  .  doxycycline (VIBRA-TABS) 100 MG tablet, Take 1 tablet (100 mg total) by mouth 2 (two) times daily., Disp: 14 tablet, Rfl: 0 .  predniSONE (DELTASONE) 10 MG tablet, 6 tablets on Day 1 , then reduce by 1 tablet daily until gone, Disp: 21 tablet, Rfl: 0 .  propranolol (INDERAL) 20 MG tablet, TAKE 1 TABLET BY MOUTH THREE TIMES DAILY AS NEEDED FOR FAST HEART RATE (Patient not taking: Reported on 08/16/2019), Disp: 90 tablet, Rfl: 0  EXAM:  VITALS per patient if applicable:  GENERAL: alert, oriented, appears well and in no acute distress  HEENT: atraumatic, conjunttiva clear, no obvious abnormalities on inspection of external nose and ears  NECK: normal movements of the head and neck  LUNGS: on inspection no signs of respiratory distress, breathing rate appears normal, no obvious gross SOB, gasping or wheezing  CV: no obvious cyanosis  MS: moves all visible extremities without noticeable abnormality  PSYCH/NEURO: pleasant and cooperative, no obvious depression or anxiety, speech and thought processing grossly intact  ASSESSMENT AND PLAN:  Discussed the following assessment and plan:  Bronchitis with acute wheezing  Bronchitis with acute wheezing Nothing to  suggest COVID since she has been masking and has had no fevers,  Body aches,  Dyspnea or loss of taste.smell.  Empiric  doxycyine prednisone and cough suppressant     I discussed the assessment and treatment plan with the patient. The patient was provided an opportunity to ask questions and all were answered. The patient agreed with the plan and demonstrated an understanding of the instructions.   The patient was advised to call back or seek an in-person evaluation if the symptoms worsen or if the condition fails to improve as anticipated.  I provided 15 minutes of non-face-to-face time during this encounter.   Crecencio Mc, MD

## 2019-08-16 NOTE — Assessment & Plan Note (Signed)
Nothing to suggest COVID since she has been masking and has had no fevers,  Body aches,  Dyspnea or loss of taste.smell.  Empiric  doxycyine prednisone and cough suppressant

## 2019-08-17 ENCOUNTER — Other Ambulatory Visit: Payer: Self-pay | Admitting: *Deleted

## 2019-08-17 DIAGNOSIS — Z20822 Contact with and (suspected) exposure to covid-19: Secondary | ICD-10-CM

## 2019-08-19 LAB — NOVEL CORONAVIRUS, NAA: SARS-CoV-2, NAA: NOT DETECTED

## 2019-08-31 ENCOUNTER — Other Ambulatory Visit: Payer: Self-pay

## 2019-09-02 ENCOUNTER — Ambulatory Visit (INDEPENDENT_AMBULATORY_CARE_PROVIDER_SITE_OTHER): Payer: Commercial Managed Care - PPO | Admitting: Internal Medicine

## 2019-09-02 ENCOUNTER — Encounter: Payer: Self-pay | Admitting: Internal Medicine

## 2019-09-02 ENCOUNTER — Other Ambulatory Visit: Payer: Self-pay

## 2019-09-02 VITALS — BP 120/80 | HR 89 | Temp 97.0°F | Ht 69.0 in | Wt 266.4 lb

## 2019-09-02 DIAGNOSIS — Z0000 Encounter for general adult medical examination without abnormal findings: Secondary | ICD-10-CM

## 2019-09-02 DIAGNOSIS — Z1231 Encounter for screening mammogram for malignant neoplasm of breast: Secondary | ICD-10-CM | POA: Diagnosis not present

## 2019-09-02 DIAGNOSIS — M25551 Pain in right hip: Secondary | ICD-10-CM

## 2019-09-02 LAB — LIPID PANEL
Cholesterol: 205 mg/dL — ABNORMAL HIGH (ref 0–200)
HDL: 43.9 mg/dL (ref >39.00)
LDL Cholesterol: 149 mg/dL — ABNORMAL HIGH (ref 0–99)
NonHDL: 160.76
Total CHOL/HDL Ratio: 5
Triglycerides: 57 mg/dL (ref 0.0–149.0)
VLDL: 11.4 mg/dL (ref 0.0–40.0)

## 2019-09-02 LAB — TSH: TSH: 1.47 u[IU]/mL (ref 0.35–4.50)

## 2019-09-02 NOTE — Progress Notes (Signed)
Patient ID: Molly Stone, female    DOB: February 06, 1981  Age: 38 y.o. MRN: [id number]  The patient is here for annual  PREVENTIVE  examination and management of other chronic and acute problems.  ENDOMETRIAL  ABLATION by Kenton Kingfisher  In November 2019.    Did not work , has been having heavy menses monthly and has been advised to consider hysterectomy    36  lb weight loss,  Has plateauedn sepsite following weight watchers.  Has redued exercise to 30 mintues due to right hip pain radiating to ankle o    The risk factors are reflected in the social history.  The roster of all physicians providing medical care to patient - is listed in the Snapshot section of the chart.  Activities of daily living:  The patient is 100% independent in all ADLs: dressing, toileting, feeding as well as independent mobility  Home safety : The patient has smoke detectors in the home. They wear seatbelts.  There are no firearms at home. There is no violence in the home.   There is no risks for hepatitis, STDs or HIV. There is no   history of blood transfusion. They have no travel history to infectious disease endemic areas of the world.  The patient has seen their dentist in the last six month. They have seen their eye doctor in the last year. They admit to slight hearing difficulty with regard to whispered voices and some television programs.  They have deferred audiologic testing in the last year.  They do not  have excessive sun exposure. Discussed the need for sun protection: hats, long sleeves and use of sunscreen if there is significant sun exposure.   Diet: the importance of a healthy diet is discussed. They do have a healthy diet.  The benefits of regular aerobic exercise were discussed. She walks 4 times per week ,  20 minutes.   Depression screen: there are no signs or vegative symptoms of depression- irritability, change in appetite, anhedonia, sadness/tearfullness.   The following portions of the patient's  history were reviewed and updated as appropriate: allergies, current medications, past family history, past medical history,  past surgical history, past social history  and problem list.  Visual acuity was not assessed per patient preference since she has regular follow up with her ophthalmologist. Hearing and body mass index were assessed and reviewed.   During the course of the visit the patient was educated and counseled about appropriate screening and preventive services including : fall prevention , diabetes screening, nutrition counseling, colorectal cancer screening, and recommended immunizations.    CC: The primary encounter diagnosis was Encounter for screening mammogram for malignant neoplasm of breast. Diagnoses of Morbid obesity ([place]), Right hip pain, and Encounter for preventive health examination were also pertinent to this visit.  History Molly Stone has a past medical history of Allergy, Anemia, Anxiety, Asthma, Chlamydia, DDD (degenerative disc disease), cervical (04/07/2019), Disorder of vocal cord, Dysmenorrhea, Dyspareunia, female, Endometriosis, Family history of endometriosis, GERD (gastroesophageal reflux disease), Headache, Heavy periods, History of nephrolithiasis, Hypertension, Jaundice, physiologic, newborn, Obesity (BMI 30-39.9), Sleep apnea, Tobacco user, and Vaginal Pap smear, abnormal.   She has a past surgical history that includes Dilation and curettage of uterus; Wisdom tooth extraction; laparoscopy; Esophagogastroduodenoscopy (egd) with propofol (N/A, 08/07/2018); Dilatation & curettage/hysteroscopy with myosure (N/A, 08/27/2018); and Root canal.   Her family history includes Cancer in her father; Cancer (age of onset: 41) in her maternal grandmother; Cancer (age of onset: 48) in her maternal  grandfather; Diabetes in her maternal uncle; Endometriosis in her mother; Heart disease in her maternal grandfather and paternal grandmother; Lupus in her father; Osteoporosis in her  maternal grandmother; Ovarian cancer in her maternal grandmother; Stroke in her father.She reports that she quit smoking about 2 years ago. Her smoking use included cigarettes. She quit after 10.00 years of use. She has never used smokeless tobacco. She reports previous alcohol use. She reports that she does not use drugs.  Outpatient Medications Prior to Visit  Medication Sig Dispense Refill  . albuterol (VENTOLIN HFA) 108 (90 Base) MCG/ACT inhaler Inhale 2 puffs into the lungs every 6 (six) hours as needed for wheezing or shortness of breath. 18 g 3  . Cholecalciferol (VITAMIN D3) 125 MCG (5000 UT) TABS Take by mouth.    . clindamycin-tretinoin (ZIANA) gel     . doxycycline (VIBRA-TABS) 100 MG tablet Take 1 tablet (100 mg total) by mouth 2 (two) times daily. 14 tablet 0  . famotidine (PEPCID) 20 MG tablet Take 20 mg by mouth 2 (two) times daily.    . fluticasone-salmeterol (ADVAIR HFA) 230-21 MCG/ACT inhaler Inhale 2 puffs into the lungs 2 (two) times daily. 36 g 12  . montelukast (SINGULAIR) 10 MG tablet Take 1 tablet (10 mg total) by mouth at bedtime. 90 tablet 1  . Multiple Vitamin (MULTIVITAMIN WITH MINERALS) TABS tablet Take 1 tablet by mouth daily.    . nebivolol (BYSTOLIC) 10 MG tablet Take 1 tablet (10 mg total) by mouth daily. (Patient taking differently: Take 5 mg by mouth daily. ) 90 tablet 3  . pantoprazole (PROTONIX) 40 MG tablet TAKE 1 TABLET TWICE A DAY 180 tablet 3  . predniSONE (DELTASONE) 10 MG tablet 6 tablets on Day 1 , then reduce by 1 tablet daily until gone 21 tablet 0  . propranolol (INDERAL) 20 MG tablet TAKE 1 TABLET BY MOUTH THREE TIMES DAILY AS NEEDED FOR FAST HEART RATE 90 tablet 0  . RHOFADE 1 % CREA      No facility-administered medications prior to visit.      Review of Systems    Patient denies headache, fevers, malaise, unintentional weight loss, skin rash, eye pain, sinus congestion and sinus pain, sore throat, dysphagia,  hemoptysis , cough, dyspnea,  wheezing, chest pain, palpitations, orthopnea, edema, abdominal pain, nausea, melena, diarrhea, constipation, flank pain, dysuria, hematuria, urinary  Frequency, nocturia, numbness, tingling, seizures,  Focal weakness, Loss of consciousness,  Tremor, insomnia, depression, anxiety, and suicidal ideation.      Objective:  BP 120/80   Pulse 89   Temp (!) 97 F (36.1 C) (Temporal)   Ht 5\' 9"  (1.753 m)   Wt 266 lb 6.4 oz (120.8 kg)   LMP 08/19/2019 (Exact Date)   SpO2 99%   BMI 39.34 kg/m   Physical Exam  General appearance: alert, cooperative and appears stated age Head: Normocephalic, without obvious abnormality, atraumatic Eyes: conjunctivae/corneas clear. PERRL, EOM's intact. Fundi benign. Ears: normal TM's and external ear canals both ears Nose: Nares normal. Septum midline. Mucosa normal. No drainage or sinus tenderness. Throat: lips, mucosa, and tongue normal; teeth and gums normal Neck: no adenopathy, no carotid bruit, no JVD, supple, symmetrical, trachea midline and thyroid not enlarged, symmetric, no tenderness/mass/nodules Lungs: clear to auscultation bilaterally Breasts: normal appearance, no masses or tenderness Heart: regular rate and rhythm, S1, S2 normal, no murmur, click, rub or gallop Abdomen: soft, non-tender; bowel sounds normal; no masses,  no organomegaly Extremities: extremities normal, atraumatic, no cyanosis or  edema Pulses: 2+ and symmetric Skin: Skin color, texture, turgor normal. No rashes or lesions Neurologic: Alert and oriented X 3, normal strength and tone. Normal symmetric reflexes. Normal coordination and gait.     Assessment & Plan:   Problem List Items Addressed This Visit      Unprioritized   Encounter for preventive health examination    age appropriate education and counseling updated, referrals for preventative services and immunizations addressed, dietary and smoking counseling addressed, most recent labs reviewed.  I have personally  reviewed and have noted:  1) the patient's medical and social history 2) The pt's use of alcohol, tobacco, and illicit drugs 3) The patient's current medications and supplements 4) Functional ability including ADL's, fall risk, home safety risk, hearing and visual impairment 5) Diet and physical activities 6) Evidence for depression or mood disorder 7) The patient's height, weight, and BMI have been recorded in the chart  I have made referrals, and provided counseling and education based on review of the above      Morbid obesity ([place])    I have congratulated her in reduction of   BMI and encouraged  Continued weight loss with goal of 10% of body weigh over the next 6 months using a low glycemic index diet and regular exercise a minimum of 5 days per week.        Relevant Orders   Lipid panel (Completed)   TSH (Completed)    Other Visit Diagnoses    Encounter for screening mammogram for malignant neoplasm of breast    -  Primary   Relevant Orders   MM 3D screening mammogram [facility]   Right hip pain       Relevant Orders   Ambulatory referral to Orthopedic Surgery      I am having Molly Stone maintain her multivitamin with minerals, propranolol, fluticasone-salmeterol, Vitamin D3, montelukast, pantoprazole, nebivolol, famotidine, albuterol, clindamycin-tretinoin, Rhofade, doxycycline, and predniSONE.  No orders of the defined types were placed in this encounter.   There are no discontinued medications.  Follow-up: No follow-ups on file.   Crecencio Mc, MD

## 2019-09-02 NOTE — Patient Instructions (Signed)
You have lost 36 lbs so far!  That is NO SMALL FEAT!  I AGREE that an occasional 1 day break for your low carb diet will help jump start your weight loss again  MORE IMPORTANT IS 30 MINUTES OF BICYCLING DAILY    Orthopedics referral for persistent right hip pain is in process   Health Maintenance, Female Adopting a healthy lifestyle and getting preventive care are important in promoting health and wellness. Ask your health care provider about:  The right schedule for you to have regular tests and exams.  Things you can do on your own to prevent diseases and keep yourself healthy. What should I know about diet, weight, and exercise? Eat a healthy diet   Eat a diet that includes plenty of vegetables, fruits, low-fat dairy products, and lean protein.  Do not eat a lot of foods that are high in solid fats, added sugars, or sodium. Maintain a healthy weight Body mass index (BMI) is used to identify weight problems. It estimates body fat based on height and weight. Your health care provider can help determine your BMI and help you achieve or maintain a healthy weight. Get regular exercise Get regular exercise. This is one of the most important things you can do for your health. Most adults should:  Exercise for at least 150 minutes each week. The exercise should increase your heart rate and make you sweat (moderate-intensity exercise).  Do strengthening exercises at least twice a week. This is in addition to the moderate-intensity exercise.  Spend less time sitting. Even light physical activity can be beneficial. Watch cholesterol and blood lipids Have your blood tested for lipids and cholesterol at 37 years of age, then have this test every 5 years. Have your cholesterol levels checked more often if:  Your lipid or cholesterol levels are high.  You are older than 37 years of age.  You are at high risk for heart disease. What should I know about cancer screening? Depending on  your health history and family history, you may need to have cancer screening at various ages. This may include screening for:  Breast cancer.  Cervical cancer.  Colorectal cancer.  Skin cancer.  Lung cancer. What should I know about heart disease, diabetes, and high blood pressure? Blood pressure and heart disease  High blood pressure causes heart disease and increases the risk of stroke. This is more likely to develop in people who have high blood pressure readings, are of African descent, or are overweight.  Have your blood pressure checked: ? Every 3-5 years if you are 37 years of age. ? Every year if you are 37 years old or older. Diabetes Have regular diabetes screenings. This checks your fasting blood sugar level. Have the screening done:  Once every three years after age 37 if you are at a normal weight and have a low risk for diabetes.  More often and at a younger age if you are overweight or have a high risk for diabetes. What should I know about preventing infection? Hepatitis B If you have a higher risk for hepatitis B, you should be screened for this virus. Talk with your health care provider to find out if you are at risk for hepatitis B infection. Hepatitis C Testing is recommended for:  Everyone born from [birth year] through 1965.  Anyone with known risk factors for hepatitis C. Sexually transmitted infections (STIs)  Get screened for STIs, including gonorrhea and chlamydia, if: ? You are sexually active and are  younger than 37 years of age. ? You are older than 37 years of age and your health care provider tells you that you are at risk for this type of infection. ? Your sexual activity has changed since you were last screened, and you are at increased risk for chlamydia or gonorrhea. Ask your health care provider if you are at risk.  Ask your health care provider about whether you are at high risk for HIV. Your health care provider may recommend a prescription  medicine to help prevent HIV infection. If you choose to take medicine to prevent HIV, you should first get tested for HIV. You should then be tested every 37 months for as long as you are taking the medicine. Pregnancy  If you are about to stop having your period (premenopausal) and you may become pregnant, seek counseling before you get pregnant.  Take 400 to 800 micrograms (mcg) of folic acid every day if you become pregnant.  Ask for birth control (contraception) if you want to prevent pregnancy. Osteoporosis and menopause Osteoporosis is a disease in which the bones lose minerals and strength with aging. This can result in bone fractures. If you are 37 years old or older, or if you are at risk for osteoporosis and fractures, ask your health care provider if you should:  Be screened for bone loss.  Take a calcium or vitamin D supplement to lower your risk of fractures.  Be given hormone replacement therapy (HRT) to treat symptoms of menopause. Follow these instructions at home: Lifestyle  Do not use any products that contain nicotine or tobacco, such as cigarettes, e-cigarettes, and chewing tobacco. If you need help quitting, ask your health care provider.  Do not use street drugs.  Do not share needles.  Ask your health care provider for help if you need support or information about quitting drugs. Alcohol use  Do not drink alcohol if: ? Your health care provider tells you not to drink. ? You are pregnant, may be pregnant, or are planning to become pregnant.  If you drink alcohol: ? Limit how much you use to 0-1 drink a day. ? Limit intake if you are breastfeeding.  Be aware of how much alcohol is in your drink. In the [place] one drink equals one 12 oz bottle of beer (355 mL), one 5 oz glass of wine (148 mL), or one 1 oz glass of hard liquor (44 mL). General instructions  Schedule regular health, dental, and eye exams.  Stay current with your vaccines.  Tell your health  care provider if: ? You often feel depressed. ? You have ever been abused or do not feel safe at home. Summary  Adopting a healthy lifestyle and getting preventive care are important in promoting health and wellness.  Follow your health care provider's instructions about healthy diet, exercising, and getting tested or screened for diseases.  Follow your health care provider's instructions on monitoring your cholesterol and blood pressure. This information is not intended to replace advice given to you by your health care provider. Make sure you discuss any questions you have with your health care provider. Document Released: 04/22/2011 Document Revised: 09/30/2018 Document Reviewed: 09/30/2018 Elsevier Patient Education  2020 [employer].

## 2019-09-04 NOTE — Assessment & Plan Note (Signed)
I have congratulated her in reduction of   BMI and encouraged  Continued weight loss with goal of 10% of body weigh over the next 6 months using a low glycemic index diet and regular exercise a minimum of 5 days per week.    

## 2019-09-04 NOTE — Assessment & Plan Note (Signed)

## 2019-09-07 ENCOUNTER — Other Ambulatory Visit: Payer: Self-pay

## 2019-09-07 MED ORDER — ALBUTEROL SULFATE HFA 108 (90 BASE) MCG/ACT IN AERS: 2.0000 | INHALATION_SPRAY | Freq: Four times a day (QID) | RESPIRATORY_TRACT | 3 refills | Status: DC | PRN

## 2019-09-08 ENCOUNTER — Other Ambulatory Visit: Payer: Self-pay

## 2019-09-08 DIAGNOSIS — M179 Osteoarthritis of knee, unspecified: Secondary | ICD-10-CM | POA: Insufficient documentation

## 2019-09-08 DIAGNOSIS — M1710 Unilateral primary osteoarthritis, unspecified knee: Secondary | ICD-10-CM | POA: Insufficient documentation

## 2019-09-08 MED ORDER — NEBIVOLOL HCL 10 MG PO TABS: 10.0000 mg | ORAL_TABLET | Freq: Every day | ORAL | 3 refills | Status: DC

## 2019-09-22 ENCOUNTER — Telehealth: Payer: Self-pay | Admitting: *Deleted

## 2019-09-22 NOTE — Telephone Encounter (Signed)
She does not need testing if she has no symptoms and the grandson has no symptoms

## 2019-09-22 NOTE — Telephone Encounter (Signed)
Copied from [place] [id number]. Topic: General - Other >> Sep 22, 2019 12:40 PM Leward Quan A wrote: Reason for CRM: Patient called to inquire of Dr Derrel Nip what should she do. States that her grandson who visited with her for the holiday is in a house with parents that were in contact with someone that recently tested positive for Covid. She have no symptoms and neither does anyone in the house but want to know from Dr Derrel Nip what should she do since she have been very careful. Ph# [phone]

## 2019-09-23 NOTE — Telephone Encounter (Signed)
Spoke with pt to let her know that as long as she was not having symptoms and the grandson was not having symptoms then she does not need to be tested. Pt gave a verbal understanding.

## 2019-09-29 ENCOUNTER — Other Ambulatory Visit: Payer: Self-pay

## 2019-09-29 ENCOUNTER — Telehealth (INDEPENDENT_AMBULATORY_CARE_PROVIDER_SITE_OTHER): Payer: Commercial Managed Care - PPO | Admitting: Cardiovascular Disease

## 2019-09-29 ENCOUNTER — Encounter: Payer: Self-pay | Admitting: Cardiovascular Disease

## 2019-09-29 VITALS — HR 60 | Ht 69.5 in | Wt 259.0 lb

## 2019-09-29 DIAGNOSIS — E782 Mixed hyperlipidemia: Secondary | ICD-10-CM

## 2019-09-29 DIAGNOSIS — I479 Paroxysmal tachycardia, unspecified: Secondary | ICD-10-CM | POA: Diagnosis not present

## 2019-09-29 MED ORDER — NEBIVOLOL HCL 5 MG PO TABS: 5.0000 mg | ORAL_TABLET | Freq: Every day | ORAL | 3 refills | Status: DC

## 2019-09-29 NOTE — Patient Instructions (Signed)

## 2019-09-29 NOTE — Progress Notes (Signed)
Virtual Visit via Video Note   This visit type was conducted due to national recommendations for restrictions regarding the COVID-19 Pandemic (e.g. social distancing) in an effort to limit this patient's exposure and mitigate transmission in our community.  Due to her co-morbid illnesses, this patient is at least at moderate risk for complications without adequate follow up.  This format is felt to be most appropriate for this patient at this time.  All issues noted in this document were discussed and addressed.  A limited physical exam was performed with this format.  Please refer to the patient's chart for her consent to telehealth for [facility].   I connected with  Molly Stone on 09/29/19 by a video enabled telemedicine application and verified that I am speaking with the correct person using two identifiers. I discussed the limitations of evaluation and management by telemedicine. The patient expressed understanding and agreed to proceed.   Evaluation Performed:  Follow-up visit  Date:  09/29/2019   ID:  Molly Stone, DOB 12-06-1980, MRN [id number]  Patient Location:  [place] [address]   Provider location:   [facility], [place] office  PCP:  Crecencio Mc, MD  Cardiologist:  Patsy Baltimore   Chief Complaint: Tachycardia, stable    History of Present Illness:    Molly Stone is a 38 y.o. female who presents via audio/video conferencing for a telehealth visit today.   The patient does not symptoms concerning for COVID-19 infection (fever, chills, cough, or new SHORTNESS OF BREATH).   Patient has a past medical history of Atrial tachycardia/paroxysmal tachycardia associated with alcohol intake, walking up steps in [place] hypertension.  obesity OSA  Not on CPAP Who presents for follow-up of her tachycardia/palpitations    started on metoprolol December 2019 Changed to bystolic 10 daily with propranolol  as needed secondary to side effects on metoprolol  11/2018 ill, viral, may have exacerbated symptoms Lots of stressors at home at the time but was improving  Taking care of several teenagers  Last visit was going to wean down any bystolic as was doing better  Feels well,  On bystolic 5 mg daily  Losing weight, weight watchers        Event monitor, Normal sinus rhythm Avg HR of 92 bpm.  1 run of Ventricular Tachycardia occurred lasting 16 beats with a max rate of 203 bpm (avg 181 bpm).  1 run of Supraventricular Tachycardiaoccurred lasting 4 beats with a max rate of 158 bpm (avg 144 bpm).  Isolated SVEs were rare (<1.0%), SVE Triplets were rare (<1.0%), and no SVE Couplets were present. Isolated VEs were rare (<1.0%), and no VE Couplets or VE Triplets were present. Patient triggered events were not associated with significant arrhythmia  Echocardiogram confirming ejection fraction greater than 55%   Previously with panic attacks Earlier in 2020 had bronchitis,   husband who working out of state in virus area  Over the summer 2019, After eating out with alcoholic  drink, Developed faster heart rate, 146 bpm, would not slow down, Diaphoretic, 1 1/2 hours for it to go away Eventually resolved on its own  Second time, again had a alcoholic drink, whisky, at a party Developed tachycardia again  Additional episode of tachycardia when walking in [place] It was hot, walking up the [facility] steps Had tachycardia had to sit down and recover    Prior CV studies:   The following studies  were reviewed today:    Past Medical History:  Diagnosis Date  . Allergy   . Anemia   . Anxiety   . Asthma   . Chlamydia    h/o  . DDD (degenerative disc disease), cervical 04/07/2019  . Disorder of vocal cord    spasmodic dysphonia  . Dysmenorrhea   . Dyspareunia, female   . Endometriosis   . Family history of endometriosis   . GERD (gastroesophageal reflux  disease)   . Headache    migraine  . Heavy periods   . History of nephrolithiasis   . Hypertension   . Jaundice, physiologic, newborn   . Obesity (BMI 30-39.9)   . Sleep apnea   . Tobacco user   . Vaginal Pap smear, abnormal    Past Surgical History:  Procedure Laterality Date  . DILATATION & CURETTAGE/HYSTEROSCOPY WITH MYOSURE N/A 08/27/2018   Procedure: DILATATION & CURETTAGE/HYSTEROSCOPY WITH MINERVA;  Surgeon: Gae Dry, MD;  Location: [facility] ORS;  Service: Gynecology;  Laterality: N/A;  . DILATION AND CURETTAGE OF UTERUS    . ESOPHAGOGASTRODUODENOSCOPY (EGD) WITH PROPOFOL N/A 08/07/2018   Procedure: ESOPHAGOGASTRODUODENOSCOPY (EGD) WITH PROPOFOL;  Surgeon: Jonathon Bellows, MD;  Location: [facility] ENDOSCOPY;  Service: Gastroenterology;  Laterality: N/A;  . laparoscopy    . ROOT CANAL    . WISDOM TOOTH EXTRACTION        Allergies:   Buprenorphine hcl, Morphine and related, Prednisone, and Tramadol hcl   Social History   Tobacco Use  . Smoking status: Former Smoker    Years: 10.00    Types: Cigarettes    Quit date: 10/2016    Years since quitting: 2.9  . Smokeless tobacco: Never Used  Substance Use Topics  . Alcohol use: Not Currently    Alcohol/week: 0.0 standard drinks  . Drug use: No     Current Outpatient Medications on File Prior to Visit  Medication Sig Dispense Refill  . albuterol (VENTOLIN HFA) 108 (90 Base) MCG/ACT inhaler Inhale 2 puffs into the lungs every 6 (six) hours as needed for wheezing or shortness of breath. 18 g 3  . Cholecalciferol (VITAMIN D3) 125 MCG (5000 UT) TABS Take by mouth.    . clindamycin-tretinoin (ZIANA) gel     . famotidine (PEPCID) 20 MG tablet Take 20 mg by mouth 2 (two) times daily.    . fluticasone-salmeterol (ADVAIR HFA) 230-21 MCG/ACT inhaler Inhale 2 puffs into the lungs 2 (two) times daily. 36 g 12  . meloxicam (MOBIC) 15 MG tablet Take 15 mg by mouth daily.    . methocarbamol (ROBAXIN) 500 MG tablet methocarbamol 500 mg  tablet  TK 2 TS PO QID    . montelukast (SINGULAIR) 10 MG tablet Take 1 tablet (10 mg total) by mouth at bedtime. 90 tablet 1  . Multiple Vitamin (MULTIVITAMIN WITH MINERALS) TABS tablet Take 1 tablet by mouth daily.    . pantoprazole (PROTONIX) 40 MG tablet TAKE 1 TABLET TWICE A DAY 180 tablet 3  . propranolol (INDERAL) 20 MG tablet TAKE 1 TABLET BY MOUTH THREE TIMES DAILY AS NEEDED FOR FAST HEART RATE 90 tablet 0  . RHOFADE 1 % CREA      No current facility-administered medications on file prior to visit.      Family Hx: The patient's family history includes Cancer in her father; Cancer (age of onset: 95) in her maternal grandmother; Cancer (age of onset: 3) in her maternal grandfather; Diabetes in her maternal uncle; Endometriosis in her mother; Heart  disease in her maternal grandfather and paternal grandmother; Lupus in her father; Osteoporosis in her maternal grandmother; Ovarian cancer in her maternal grandmother; Stroke in her father. There is no history of Breast cancer, Colon cancer, Kidney cancer, or Bladder Cancer.  ROS:   Please see the history of present illness.    Review of Systems  Constitutional: Negative.   HENT: Negative.   Respiratory: Negative.   Cardiovascular: Negative.   Gastrointestinal: Negative.   Musculoskeletal: Negative.   Neurological: Negative.   Psychiatric/Behavioral: Negative.   All other systems reviewed and are negative.    Labs/Other Tests and Data Reviewed:    Recent Labs: 04/08/2019: ALT 22; BUN 8; Creatinine, Ser 0.76; Potassium 4.1; Sodium 138 07/12/2019: Hemoglobin 14.1; Platelets 187.0 09/02/2019: TSH 1.47   Recent Lipid Panel Lab Results  Component Value Date/Time   CHOL 205 (H) 09/02/2019 09:19 AM   CHOL 210 (H) 04/08/2019 09:49 AM   TRIG 57.0 09/02/2019 09:19 AM   HDL 43.90 09/02/2019 09:19 AM   HDL 46 04/08/2019 09:49 AM   CHOLHDL 5 09/02/2019 09:19 AM   LDLCALC 149 (H) 09/02/2019 09:19 AM   LDLCALC 151 (H) 04/08/2019 09:49  AM   LDLDIRECT 155 (H) 08/18/2015 03:11 PM    Wt Readings from Last 3 Encounters:  09/29/19 259 lb (117.5 kg)  09/02/19 266 lb 6.4 oz (120.8 kg)  08/16/19 262 lb 14.4 oz (119.3 kg)     Exam:    Vital Signs: Vital signs may also be detailed in the HPI Pulse 60   Ht 5' 9.5" (1.765 m)   Wt 259 lb (117.5 kg)   BMI 37.70 kg/m   Wt Readings from Last 3 Encounters:  09/29/19 259 lb (117.5 kg)  09/02/19 266 lb 6.4 oz (120.8 kg)  08/16/19 262 lb 14.4 oz (119.3 kg)   Temp Readings from Last 3 Encounters:  09/02/19 (!) 97 F (36.1 C) (Temporal)  07/12/19 98.6 F (37 C)  03/21/19 98.2 F (36.8 C) (Oral)   BP Readings from Last 3 Encounters:  09/02/19 120/80  08/09/19 120/80  07/12/19 110/72   Pulse Readings from Last 3 Encounters:  09/29/19 60  09/02/19 89  07/12/19 87     Well nourished, well developed female in no acute distress. Constitutional:  oriented to person, place, and time. No distress.  Head: Normocephalic and atraumatic.  Eyes:  no discharge. No scleral icterus.  No respiratory distress   ASSESSMENT & PLAN:    Problem List Items Addressed This Visit      Cardiology Problems   Hyperlipidemia   Relevant Medications   nebivolol (BYSTOLIC) 5 MG tablet   Paroxysmal tachycardia (HCC) - Primary   Relevant Medications   nebivolol (BYSTOLIC) 5 MG tablet     Paroxysmal tachycardia Well-controlled on bystolic 5 mg daily Rarely has to ever take extra beta-blocker Less stressors at home No further work-up needed  Hyperlipidemia Elevated LDL, she reports it is familial Discussed screening studies down the road such as CT coronary calcium scoring  Not needed at this time, recommended continued lifestyle modification  Obesity Reports weight loss through dietary changes  COVID-19 Education: The signs and symptoms of COVID-19 were discussed with the patient and how to seek care for testing (follow up with PCP or arrange E-visit).  The importance of social  distancing was discussed today.  Patient Risk:   After full review of this patients clinical status, I feel that they are at least moderate risk at this time.  Time:  Today, I have spent 25 minutes with the patient with telehealth technology discussing the cardiac and medical problems/diagnoses detailed above   Additional 10 min spent reviewing the chart prior to patient visit today   Medication Adjustments/Labs and Tests Ordered: Current medicines are reviewed at length with the patient today.  Concerns regarding medicines are outlined above.   Tests Ordered: No tests ordered   Medication Changes: No changes made   Disposition: Follow-up in 12 months   Signed, Ida Rogue, MD  [place] Office [address] [place] [address], [place], [place] [address]

## 2019-11-04 ENCOUNTER — Encounter: Payer: Self-pay | Admitting: *Deleted

## 2019-11-04 ENCOUNTER — Ambulatory Visit
Admission: EM | Admit: 2019-11-04 | Discharge: 2019-11-04 | Disposition: A | Discharge status: Home / Self Care | Payer: Commercial Managed Care - PPO | Attending: Emergency Medicine | Admitting: Emergency Medicine

## 2019-11-04 DIAGNOSIS — Z20822 Contact with and (suspected) exposure to covid-19: Secondary | ICD-10-CM

## 2019-11-04 DIAGNOSIS — R059 Cough, unspecified: Secondary | ICD-10-CM

## 2019-11-04 DIAGNOSIS — R05 Cough: Secondary | ICD-10-CM

## 2019-11-04 NOTE — ED Provider Notes (Signed)
[facility]    CSN: [id number] Arrival date & time: 11/04/19  1137      History   Chief Complaint Chief Complaint  Patient presents with  . Cough    HPI Molly Stone is a 39 y.o. female.   Patient presents with nonproductive cough and nasal drainage x1 week.  She requested COVID test.  She denies fever, chills, sore throat, shortness of breath, vomiting, diarrhea, rash, or other symptoms.  No treatments attempted at home.  The history is provided by the patient.    Past Medical History:  Diagnosis Date  . Allergy   . Anemia   . Anxiety   . Asthma   . Chlamydia    h/o  . DDD (degenerative disc disease), cervical 04/07/2019  . Disorder of vocal cord    spasmodic dysphonia  . Dysmenorrhea   . Dyspareunia, female   . Endometriosis   . Family history of endometriosis   . GERD (gastroesophageal reflux disease)   . Headache    migraine  . Heavy periods   . History of nephrolithiasis   . Hypertension   . Jaundice, physiologic, newborn   . Obesity (BMI 30-39.9)   . Sleep apnea   . Tobacco user   . Vaginal Pap smear, abnormal     Patient Active Problem List   Diagnosis Date Noted  . Bronchitis with acute wheezing 08/16/2019  . Neutropenia ([place]) 07/12/2019  . Colicky LUQ abdominal pain 06/21/2019  . DDD (degenerative disc disease), cervical 04/07/2019  . Hyperlipidemia 04/07/2019  . Abnormal MRI, cervical spine 04/07/2019  . Right calf pain 03/24/2019  . Dizziness 03/23/2019  . Paroxysmal tachycardia ([place]) 01/28/2019  . Shortness of breath 01/28/2019  . Anxiety 01/28/2019  . SVT (supraventricular tachycardia) ([place]) 09/12/2018  . Chronic bilateral thoracic back pain 03/07/2017  . Spinal stenosis 03/05/2017  . Concussion without loss of consciousness 08/04/2016  . OSA (obstructive sleep apnea) 07/24/2016  . Generalized anxiety disorder 10/25/2015  . Family history of endometriosis 10/12/2015  . Dysmenorrhea 10/12/2015  . H/O renal calculi  08/15/2015  . Morbid obesity ([place]) 08/15/2015  . Encounter for preventive health examination 06/16/2014  . Sciatica 04/01/2014  . Vitamin D deficiency 04/12/2012  . 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  . GERD (gastroesophageal reflux disease)     Past Surgical History:  Procedure Laterality Date  . DILATATION & CURETTAGE/HYSTEROSCOPY WITH MYOSURE N/A 08/27/2018   Procedure: DILATATION & CURETTAGE/HYSTEROSCOPY WITH MINERVA;  Surgeon: Gae Dry, MD;  Location: [facility] ORS;  Service: Gynecology;  Laterality: N/A;  . DILATION AND CURETTAGE OF UTERUS    . ESOPHAGOGASTRODUODENOSCOPY (EGD) WITH PROPOFOL N/A 08/07/2018   Procedure: ESOPHAGOGASTRODUODENOSCOPY (EGD) WITH PROPOFOL;  Surgeon: Jonathon Bellows, MD;  Location: [facility] ENDOSCOPY;  Service: Gastroenterology;  Laterality: N/A;  . laparoscopy    . ROOT CANAL    . WISDOM TOOTH EXTRACTION      OB History    Gravida  6   Para  3   Term  3   Preterm      AB  3   Living  2     SAB  2   TAB  1   Ectopic      Multiple      Live Births  2            Home Medications    Prior to Admission medications   Medication Sig Start Date End Date Taking? Authorizing Provider  albuterol (VENTOLIN HFA) 108 (90 Base) MCG/ACT inhaler  Inhale 2 puffs into the lungs every 6 (six) hours as needed for wheezing or shortness of breath. 09/07/19   Crecencio Mc, MD  Cholecalciferol (VITAMIN D3) 125 MCG (5000 UT) TABS Take by mouth.    [provider]  clindamycin-tretinoin [place]) gel  07/26/19   [provider]  famotidine (PEPCID) 20 MG tablet Take 20 mg by mouth 2 (two) times daily.    [provider]  fluticasone-salmeterol (ADVAIR HFA) 230-21 MCG/ACT inhaler Inhale 2 puffs into the lungs 2 (two) times daily. 03/22/19   Crecencio Mc, MD  meloxicam (MOBIC) 15 MG tablet Take 15 mg by mouth daily. 09/08/19   [provider]  methocarbamol (ROBAXIN) 500 MG tablet methocarbamol 500 mg tablet  TK 2 TS PO QID     [provider]  montelukast (SINGULAIR) 10 MG tablet Take 1 tablet (10 mg total) by mouth at bedtime. 04/13/19   Crecencio Mc, MD  Multiple Vitamin (MULTIVITAMIN WITH MINERALS) TABS tablet Take 1 tablet by mouth daily.    [provider]  nebivolol (BYSTOLIC) 5 MG tablet Take 1 tablet (5 mg total) by mouth daily. 09/29/19   Minna Merritts, MD  pantoprazole (PROTONIX) 40 MG tablet TAKE 1 TABLET TWICE A DAY 06/22/19   Jonathon Bellows, MD  propranolol (INDERAL) 20 MG tablet TAKE 1 TABLET BY MOUTH THREE TIMES DAILY AS NEEDED FOR FAST HEART RATE 02/04/19   Minna Merritts, MD  RHOFADE 1 % CREA  06/29/19   [provider]    Family History Family History  Problem Relation Age of Onset  . Endometriosis Mother   . Cancer Father        benign liver CA  . Stroke Father        recurrent blood clots on the brain   . Lupus Father   . Cancer Maternal Grandmother 30       ovarian Ca, still surviving   . Osteoporosis Maternal Grandmother   . Ovarian cancer Maternal Grandmother   . Heart disease Maternal Grandfather   . Cancer Maternal Grandfather 68       colon CA. metastatic  . Diabetes Maternal Uncle   . Heart disease Paternal Grandmother   . Breast cancer Neg Hx   . Colon cancer Neg Hx   . Kidney cancer Neg Hx   . Bladder Cancer Neg Hx     Social History Social History   Tobacco Use  . Smoking status: Former Smoker    Years: 10.00    Types: Cigarettes    Quit date: 10/2016    Years since quitting: 3.0  . Smokeless tobacco: Never Used  Substance Use Topics  . Alcohol use: Not Currently    Alcohol/week: 0.0 standard drinks  . Drug use: No     Allergies   Buprenorphine hcl, Morphine and related, Prednisone, and Tramadol hcl   Review of Systems Review of Systems  Constitutional: Negative for chills and fever.  HENT: Positive for postnasal drip and rhinorrhea. Negative for ear pain and sore throat.   Eyes: Negative for pain and visual disturbance.   Respiratory: Positive for cough. Negative for shortness of breath.   Cardiovascular: Negative for chest pain and palpitations.  Gastrointestinal: Negative for abdominal pain, diarrhea, nausea and vomiting.  Genitourinary: Negative for dysuria and hematuria.  Musculoskeletal: Negative for arthralgias and back pain.  Skin: Negative for color change and rash.  Neurological: Negative for seizures and syncope.  All other systems reviewed and are  negative.    Physical Exam Triage Vital Signs ED Triage Vitals  Enc Vitals Group     BP 11/04/19 1145 121/83     Pulse Rate 11/04/19 1145 89     Resp 11/04/19 1145 18     Temp 11/04/19 1145 98.6 F (37 C)     Temp Source 11/04/19 1145 Oral     SpO2 11/04/19 1145 98 %     Weight --      Height --      Head Circumference --      Peak Flow --      Pain Score 11/04/19 1143 0     Pain Loc --      Pain Edu? --      Excl. in [place]? --    No data found.  Updated Vital Signs BP 121/83 (BP Location: Left Arm)   Pulse 89   Temp 98.6 F (37 C) (Oral)   Resp 18   LMP 10/13/2019   SpO2 98%   Visual Acuity Right Eye Distance:   Left Eye Distance:   Bilateral Distance:    Right Eye Near:   Left Eye Near:    Bilateral Near:     Physical Exam Vitals and nursing note reviewed.  Constitutional:      General: She is not in acute distress.    Appearance: She is well-developed. She is not ill-appearing.  HENT:     Head: Normocephalic and atraumatic.     Right Ear: Tympanic membrane normal.     Left Ear: Tympanic membrane normal.     Nose: Rhinorrhea present.     Mouth/Throat:     Mouth: Mucous membranes are moist.     Pharynx: Oropharynx is clear.  Eyes:     Conjunctiva/sclera: Conjunctivae normal.  Cardiovascular:     Rate and Rhythm: Normal rate and regular rhythm.     Heart sounds: No murmur.  Pulmonary:     Effort: Pulmonary effort is normal. No respiratory distress.     Breath sounds: Normal breath sounds.  Abdominal:      General: Bowel sounds are normal.     Palpations: Abdomen is soft.     Tenderness: There is no abdominal tenderness. There is no guarding or rebound.  Musculoskeletal:     Cervical back: Neck supple.  Skin:    General: Skin is warm and dry.     Findings: No rash.  Neurological:     General: No focal deficit present.     Mental Status: She is alert and oriented to person, place, and time.  Psychiatric:        Mood and Affect: Mood normal.        Behavior: Behavior normal.      UC Treatments / Results  Labs (all labs ordered are listed, but only abnormal results are displayed) Labs Reviewed  NOVEL CORONAVIRUS, NAA    EKG   Radiology No results found.  Procedures Procedures (including critical care time)  Medications Ordered in UC Medications - No data to display  Initial Impression / Assessment and Plan / UC Course  I have reviewed the triage vital signs and the nursing notes.  Pertinent labs & imaging results that were available during my care of the patient were reviewed by me and considered in my medical decision making (see chart for details).   Cough, patient request for COVID test.  COVID test performed here.  Instructed patient to self quarantine until the test result is back.  Discussed with patient that she can take Tylenol as needed for fever or discomfort.  Instructed patient to go to the emergency department if she develops high fever, shortness of breath, severe diarrhea, or other concerning symptoms.  Patient agrees with plan of care.    Final Clinical Impressions(s) / UC Diagnoses   Final diagnoses:  Encounter for screening laboratory testing for COVID-19 virus  Cough     Discharge Instructions     Your COVID test is pending.  You should self quarantine until your test result is back and is negative.    Take Tylenol as needed for fever or discomfort.  Rest and keep yourself hydrated with 8-10 glasses of water each day.    Go to the emergency  department if you develop high fever, shortness of breath, severe diarrhea, or other concerning symptoms.       ED Prescriptions    None     PDMP not reviewed this encounter.   Sharion Balloon, NP 11/04/19 1210

## 2019-11-04 NOTE — Discharge Instructions (Addendum)
Your COVID test is pending.  You should self quarantine until your test result is back and is negative.    Take Tylenol as needed for fever or discomfort.  Rest and keep yourself hydrated with 8-10 glasses of water each day.    Go to the emergency department if you develop high fever, shortness of breath, severe diarrhea, or other concerning symptoms.    

## 2019-11-04 NOTE — ED Triage Notes (Signed)
Patient reports cough x 1 week and nasal drainage. Patient taking zyrtec and flonase, states cough has become dry. Patient reports cough is worse in the am. Patient with hx of asthma. No fever.

## 2019-11-05 ENCOUNTER — Encounter: Payer: Self-pay | Admitting: Internal Medicine

## 2019-11-05 ENCOUNTER — Ambulatory Visit (INDEPENDENT_AMBULATORY_CARE_PROVIDER_SITE_OTHER): Payer: Commercial Managed Care - PPO | Admitting: Internal Medicine

## 2019-11-05 ENCOUNTER — Other Ambulatory Visit: Payer: Self-pay

## 2019-11-05 VITALS — Ht 69.5 in | Wt 266.0 lb

## 2019-11-05 DIAGNOSIS — R5383 Other fatigue: Secondary | ICD-10-CM | POA: Diagnosis not present

## 2019-11-05 DIAGNOSIS — J40 Bronchitis, not specified as acute or chronic: Secondary | ICD-10-CM | POA: Diagnosis not present

## 2019-11-05 DIAGNOSIS — J4521 Mild intermittent asthma with (acute) exacerbation: Secondary | ICD-10-CM

## 2019-11-05 DIAGNOSIS — J069 Acute upper respiratory infection, unspecified: Secondary | ICD-10-CM

## 2019-11-05 MED ORDER — PREDNISONE 20 MG PO TABS: 20.0000 mg | ORAL_TABLET | Freq: Every day | ORAL | 0 refills | Status: DC

## 2019-11-05 MED ORDER — AZITHROMYCIN 250 MG PO TABS: ORAL_TABLET | ORAL | 0 refills | Status: DC

## 2019-11-05 NOTE — Progress Notes (Signed)
Virtual Visit via Video Note  I connected with Molly Stone  on 11/05/19 at 10:52 AM EST by a video enabled telemedicine application and verified that I am speaking with the correct person using two identifiers.  Location patient: home Location provider:work or home office Persons participating in the virtual visit: patient, provider  I discussed the limitations of evaluation and management by telemedicine and the availability of in person appointments. The patient expressed understanding and agreed to proceed.   HPI: 1. C/o fatigue and cough, +PND, sneezing and with nose bleeds (thinks related to flonase use), rattling in her chest w/o wheezing, went to [facility] 11/04/19 and tested for covid 19 due to husband works as Horticulturist, commercial and had been up [employer] recently and 2-3 weeks ago c/o head cold.  She also reports she was cold and clammy which never happens ear temp was 98.6 F.  Denies sore throat/body aches and sob. Check O2 sat and was 95-98%   >1 week had cold/uri ssx's with clear phelgm now dry cough and post nasal drainage with h/o asthma and allergies. She started using flonase again even though normally gives h/a and nose bleeds and also uses advair with h/o asthma   She is taking vitafusion mvt, power zinc gummies 3x per day, vitamin D 3x per day, quercetin, D3 5000 IU qd   She does report recent h/o smoking cigarettes as well. Reviewed prior CXR with h/o bronchitis   Of note she reports flonase causes h/a and nose bleeds at times  ROS: See pertinent positives and negatives per HPI.  Past Medical History:  Diagnosis Date  . Allergy   . Anemia   . Anxiety   . Asthma   . Chlamydia    h/o  . DDD (degenerative disc disease), cervical 04/07/2019  . Disorder of vocal cord    spasmodic dysphonia  . Dysmenorrhea   . Dyspareunia, female   . Endometriosis   . Family history of endometriosis   . GERD (gastroesophageal reflux disease)   . Headache    migraine  . Heavy periods   .  History of nephrolithiasis   . Hypertension   . Jaundice, physiologic, newborn   . Obesity (BMI 30-39.9)   . Sleep apnea   . Tobacco user   . Vaginal Pap smear, abnormal     Past Surgical History:  Procedure Laterality Date  . DILATATION & CURETTAGE/HYSTEROSCOPY WITH MYOSURE N/A 08/27/2018   Procedure: DILATATION & CURETTAGE/HYSTEROSCOPY WITH MINERVA;  Surgeon: Gae Dry, MD;  Location: [facility] ORS;  Service: Gynecology;  Laterality: N/A;  . DILATION AND CURETTAGE OF UTERUS    . ESOPHAGOGASTRODUODENOSCOPY (EGD) WITH PROPOFOL N/A 08/07/2018   Procedure: ESOPHAGOGASTRODUODENOSCOPY (EGD) WITH PROPOFOL;  Surgeon: Jonathon Bellows, MD;  Location: [facility] ENDOSCOPY;  Service: Gastroenterology;  Laterality: N/A;  . laparoscopy    . ROOT CANAL    . WISDOM TOOTH EXTRACTION      Family History  Problem Relation Age of Onset  . Endometriosis Mother   . Cancer Father        benign liver CA  . Stroke Father        recurrent blood clots on the brain   . Lupus Father   . Cancer Maternal Grandmother 30       ovarian Ca, still surviving   . Osteoporosis Maternal Grandmother   . Ovarian cancer Maternal Grandmother   . Heart disease Maternal Grandfather   . Cancer Maternal Grandfather 62       colon  CA. metastatic  . Diabetes Maternal Uncle   . Heart disease Paternal Grandmother   . Breast cancer Neg Hx   . Colon cancer Neg Hx   . Kidney cancer Neg Hx   . Bladder Cancer Neg Hx     SOCIAL HX: married   Current Outpatient Medications:  .  albuterol (VENTOLIN HFA) 108 (90 Base) MCG/ACT inhaler, Inhale 2 puffs into the lungs every 6 (six) hours as needed for wheezing or shortness of breath., Disp: 18 g, Rfl: 3 .  Cholecalciferol (VITAMIN D3) 125 MCG (5000 UT) TABS, Take by mouth., Disp: , Rfl:  .  clindamycin-tretinoin (ZIANA) gel, , Disp: , Rfl:  .  famotidine (PEPCID) 20 MG tablet, Take 20 mg by mouth 2 (two) times daily., Disp: , Rfl:  .  fluticasone-salmeterol (ADVAIR HFA) 230-21 MCG/ACT  inhaler, Inhale 2 puffs into the lungs 2 (two) times daily., Disp: 36 g, Rfl: 12 .  meloxicam (MOBIC) 15 MG tablet, Take 15 mg by mouth daily., Disp: , Rfl:  .  methocarbamol (ROBAXIN) 500 MG tablet, methocarbamol 500 mg tablet  TK 2 TS PO QID, Disp: , Rfl:  .  montelukast (SINGULAIR) 10 MG tablet, Take 1 tablet (10 mg total) by mouth at bedtime., Disp: 90 tablet, Rfl: 1 .  Multiple Vitamin (MULTIVITAMIN WITH MINERALS) TABS tablet, Take 1 tablet by mouth daily., Disp: , Rfl:  .  nebivolol (BYSTOLIC) 5 MG tablet, Take 1 tablet (5 mg total) by mouth daily., Disp: 90 tablet, Rfl: 3 .  pantoprazole (PROTONIX) 40 MG tablet, TAKE 1 TABLET TWICE A DAY, Disp: 180 tablet, Rfl: 3 .  propranolol (INDERAL) 20 MG tablet, TAKE 1 TABLET BY MOUTH THREE TIMES DAILY AS NEEDED FOR FAST HEART RATE, Disp: 90 tablet, Rfl: 0 .  RHOFADE 1 % CREA, , Disp: , Rfl:  .  azithromycin (ZITHROMAX) 250 MG tablet, 2 pills day 1 and 1 pill day 2-5, Disp: 6 tablet, Rfl: 0 .  predniSONE (DELTASONE) 20 MG tablet, Take 1 tablet (20 mg total) by mouth daily with breakfast. X 5-10 days, Disp: 10 tablet, Rfl: 0  EXAM:  VITALS per patient if applicable:  GENERAL: alert, oriented, appears well and in no acute distress  HEENT: atraumatic, conjunttiva clear, no obvious abnormalities on inspection of external nose and ears  NECK: normal movements of the head and neck  LUNGS: on inspection no signs of respiratory distress, breathing rate appears normal, no obvious gross SOB, gasping or wheezing  CV: no obvious cyanosis  MS: moves all visible extremities without noticeable abnormality  PSYCH/NEURO: pleasant and cooperative, no obvious depression or anxiety, speech and thought processing grossly intact  ASSESSMENT AND PLAN:  Discussed the following assessment and plan:  Bronchitis vs asthma exacerbation with URI, pending covid 19 test, ? EBV- Plan: predniSONE (DELTASONE) 20 MG tablet x 5-10 days, azithromycin (ZITHROMAX) 250 MG  tablet Cont supplements rec Mucinex DM green label otc add NS to flonase and otc allergy pill  Mild intermittent asthma with exacerbation - Plan: predniSONE (DELTASONE) 20 MG tablet, azithromycin (ZITHROMAX) 250 MG tablet If covid negative consider testing for mononucleosis at [facility]   -we discussed possible serious and likely etiologies, options for evaluation and workup, limitations of telemedicine visit vs in person visit, treatment, treatment risks and precautions. Pt prefers to treat via telemedicine empirically rather then risking or undertaking an in person visit at this moment. Patient agrees to seek prompt in person care if worsening, new symptoms arise, or if is not improving  with treatment.   I discussed the assessment and treatment plan with the patient. The patient was provided an opportunity to ask questions and all were answered. The patient agreed with the plan and demonstrated an understanding of the instructions.   The patient was advised to call back or seek an in-person evaluation if the symptoms worsen or if the condition fails to improve as anticipated.  Time spent 20-29 minutes  Delorise Jackson, MD

## 2019-11-06 LAB — NOVEL CORONAVIRUS, NAA: SARS-CoV-2, NAA: NOT DETECTED

## 2019-11-20 IMAGING — CT CT ABD-PELV W/ CM
2 of 4 series · 16 of 46 positions shown, 18 images · IV contrast (APPLIED)
Comparison: 07/27/2017

CLINICAL DATA: Acute right abdominal and flank pain with urinary
frequency, suspicion for appendicitis

EXAM:
CT ABDOMEN AND PELVIS WITH CONTRAST
TECHNIQUE: Multidetector CT imaging of the abdomen and pelvis was performed
using the standard protocol following bolus administration of
intravenous contrast.
CONTRAST:  100mL CX8UQU-KBB IOPAMIDOL (CX8UQU-KBB) INJECTION 61%

[Series 2: routine abd/pel with · axial · 0.74mm/px · z∈[-1023,-568]mm · 13 of 101 slices shown, 15 images]
[im 5/101  soft-tissue]
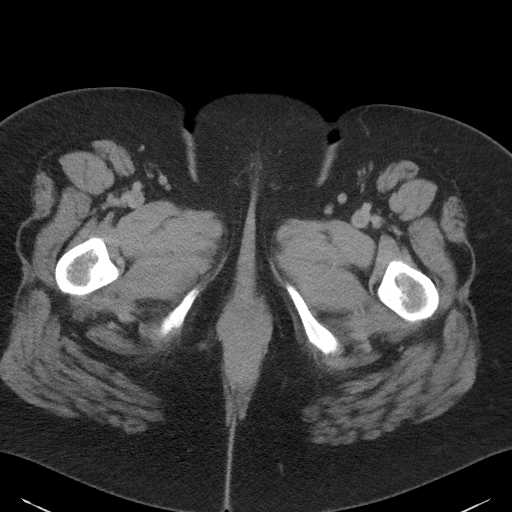
[im 5/101  bone]
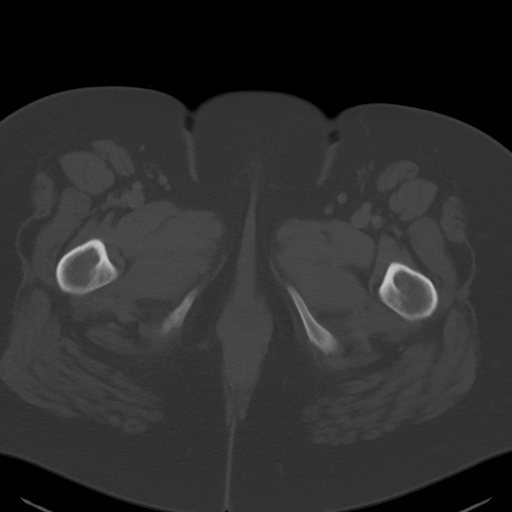
[im 14/101  soft-tissue]
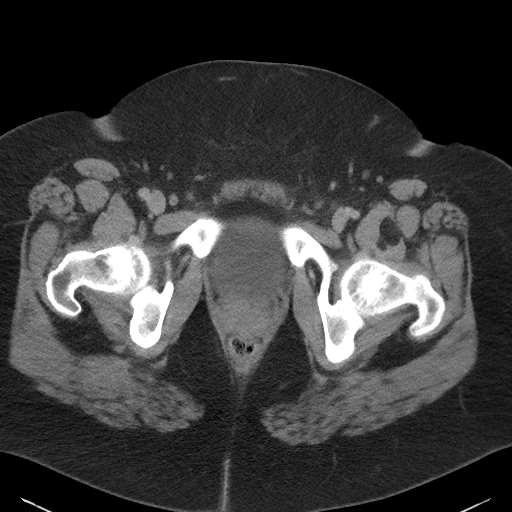
[im 22/101  soft-tissue]
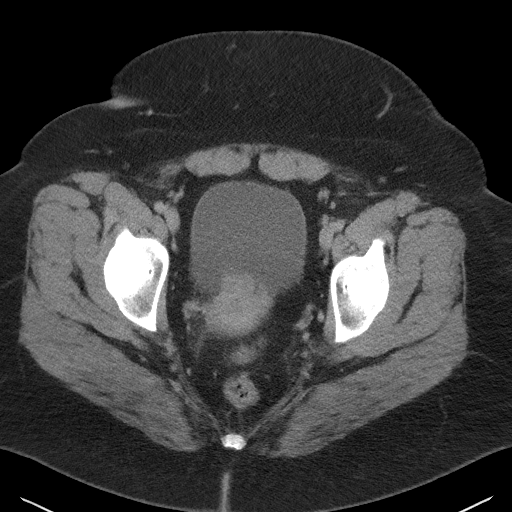
[im 27/101  soft-tissue]
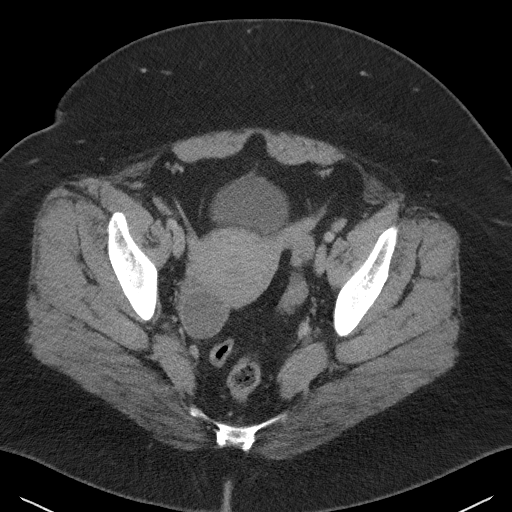
[im 35/101  soft-tissue]
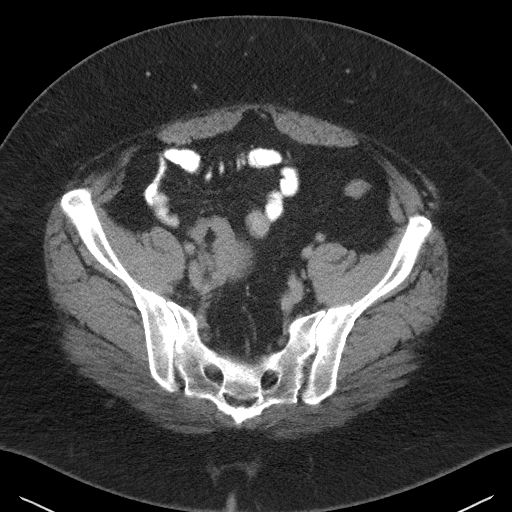
[im 44/101  soft-tissue]
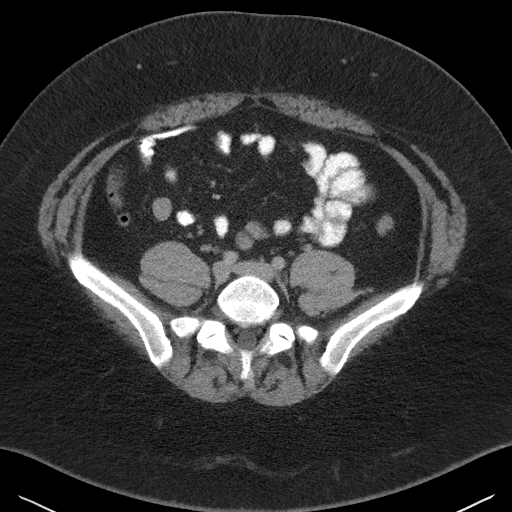
[im 53/101  soft-tissue]
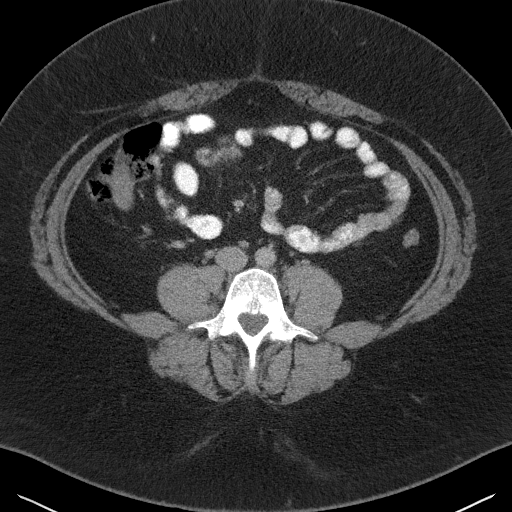
[im 57/101  soft-tissue]
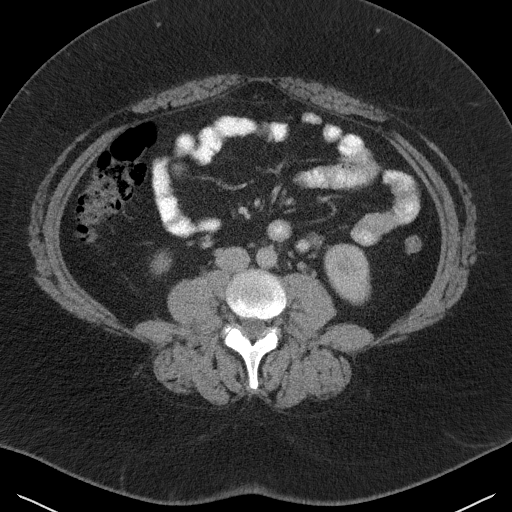
[im 66/101  soft-tissue]
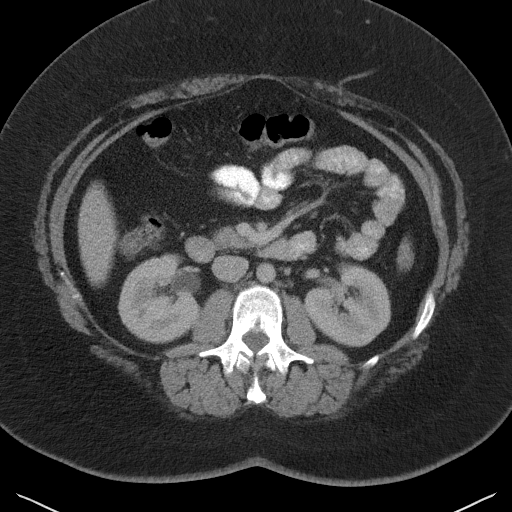
[im 66/101  bone]
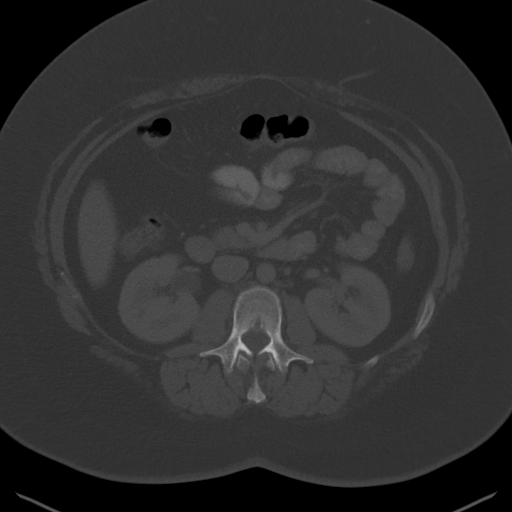
[im 74/101  soft-tissue]
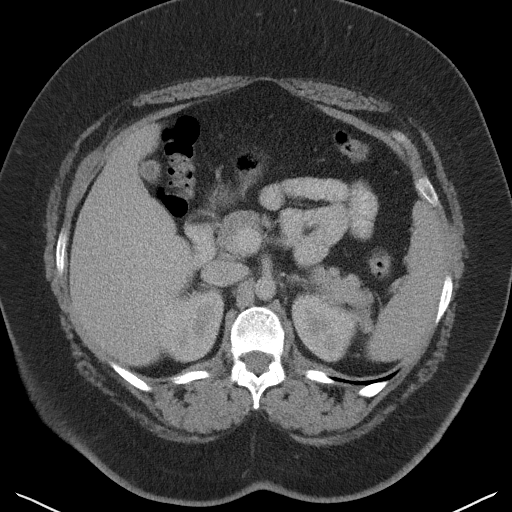
[im 79/101  soft-tissue]
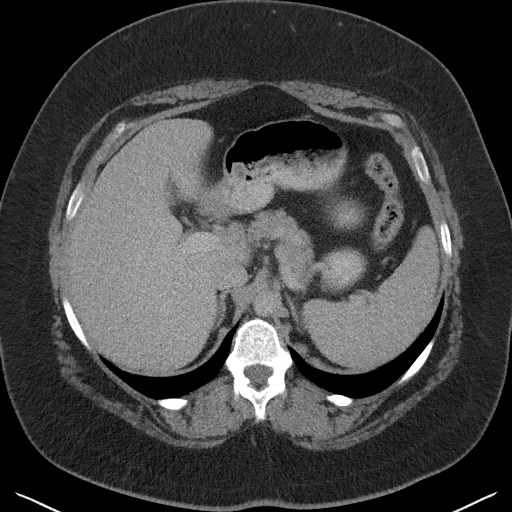
[im 87/101  soft-tissue]
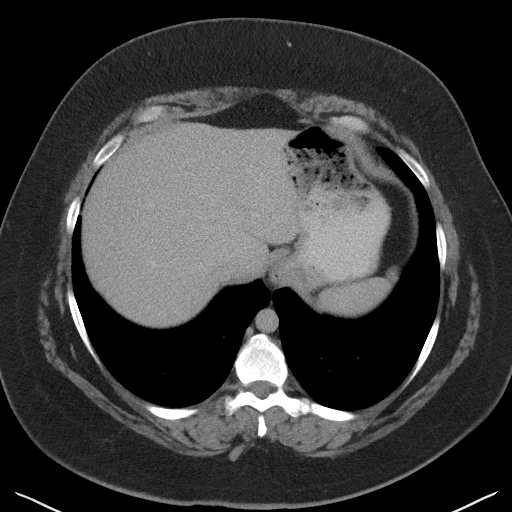
[im 96/101  soft-tissue]
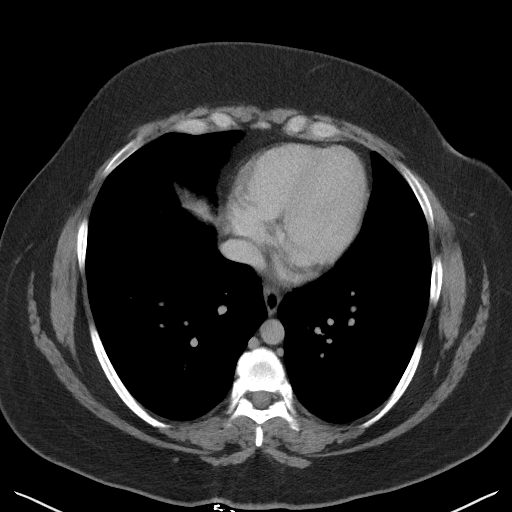

[Series 5: coronal st · coronal · 0.75mm/px · 3 of 114 slices shown]
[im 38/114  soft-tissue]
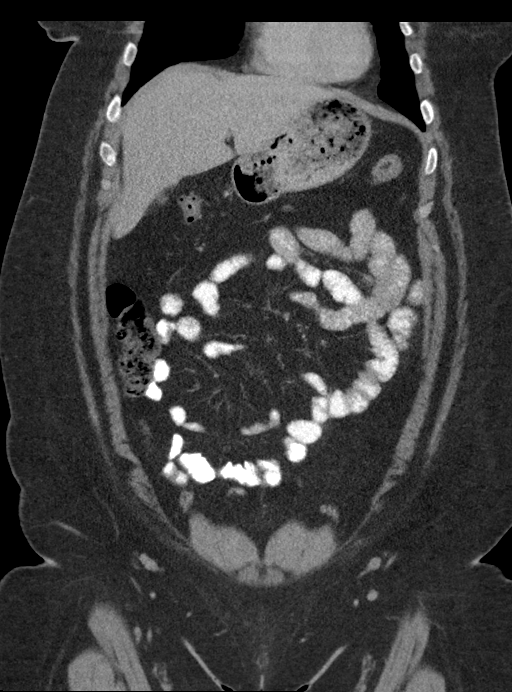
[im 51/114  soft-tissue]
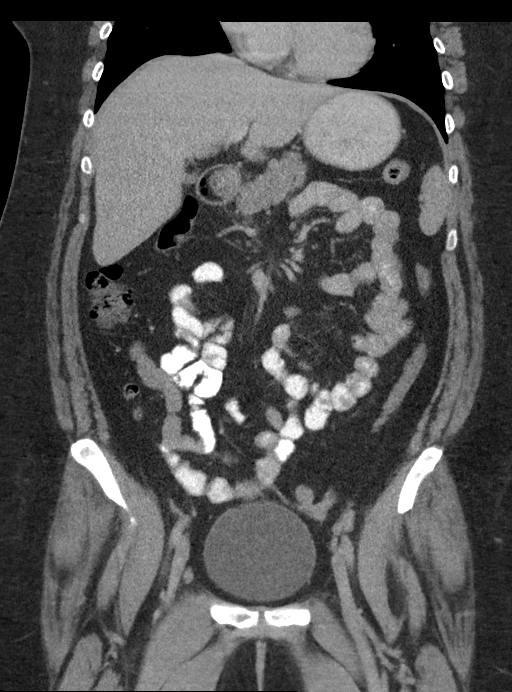
[im 63/114  soft-tissue]
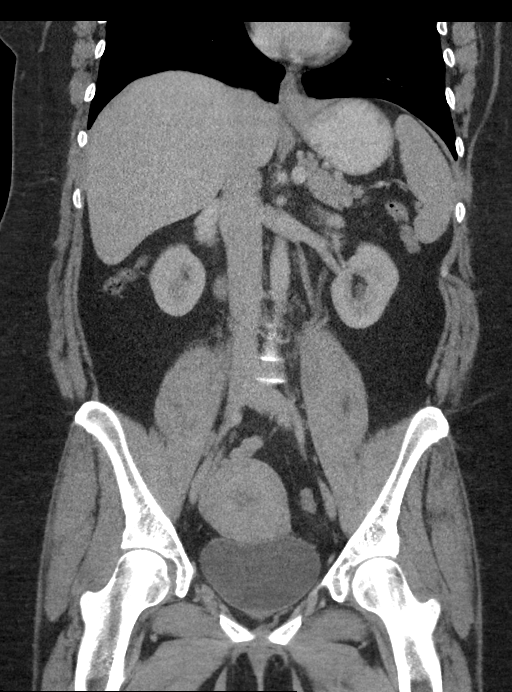

[16 of 46 positions shown; findings below may reference images not displayed]

FINDINGS: Lower chest: No acute abnormality.

Hepatobiliary: No focal liver abnormality is seen. No gallstones,
gallbladder wall thickening, or biliary dilatation.

Pancreas: Unremarkable. No pancreatic ductal dilatation or
surrounding inflammatory changes.

Spleen: Normal in size without focal abnormality. Accessory splenule
noted.

Adrenals/Urinary Tract: Adrenal glands are unremarkable. Kidneys are
normal, without renal calculi, focal lesion, or hydronephrosis.
Bladder is unremarkable.

Stomach/Bowel: Stomach is within normal limits. Appendix appears
normal. No evidence of bowel wall thickening, distention, or
inflammatory changes.

Vascular/Lymphatic: Negative for aneurysm. No aortoiliac occlusive
disease. Mesenteric and renal vasculature appear patent. No
adenopathy.

Reproductive: Uterus and left adnexa unremarkable. Right ovary
contains a 3.6 cm hypodense cyst, image 74 series 2. No free fluid,
fluid collection, or hemorrhage.

Other: No abdominal wall hernia or abnormality. No abdominopelvic
ascites.

Musculoskeletal: No acute or significant osseous findings.
IMPRESSION: No acute intra-abdominal or pelvic finding by CT.

Normal appendix

No acute inflammatory process, fluid collection or abscess.

3.6 cm right ovarian cyst without CT evidence of hemorrhage or
rupture.

These results will be called to the ordering clinician or
representative by the Radiologist Assistant, and communication
documented in the PACS or zVision Dashboard.

## 2019-11-22 ENCOUNTER — Other Ambulatory Visit: Payer: Self-pay | Admitting: Internal Medicine

## 2019-12-06 ENCOUNTER — Other Ambulatory Visit: Payer: Self-pay

## 2019-12-06 MED ORDER — NEBIVOLOL HCL 5 MG PO TABS: 5.0000 mg | ORAL_TABLET | Freq: Every day | ORAL | 3 refills | Status: DC

## 2019-12-10 ENCOUNTER — Telehealth: Payer: Self-pay

## 2019-12-10 NOTE — Telephone Encounter (Signed)
Thank you :)

## 2019-12-10 NOTE — Telephone Encounter (Signed)
[facility] called and stated that the Ventolin inhaler was not covered by the pt's insurance and was wanting to know if they could switch it to the generic albuterol inhaler. Permission was given to switch.

## 2019-12-14 ENCOUNTER — Other Ambulatory Visit: Payer: Self-pay

## 2019-12-14 MED ORDER — ALBUTEROL SULFATE HFA 108 (90 BASE) MCG/ACT IN AERS: 2.0000 | INHALATION_SPRAY | Freq: Four times a day (QID) | RESPIRATORY_TRACT | 3 refills | Status: DC | PRN

## 2019-12-22 ENCOUNTER — Other Ambulatory Visit: Payer: Self-pay | Admitting: Cardiovascular Disease

## 2019-12-22 ENCOUNTER — Telehealth: Payer: Self-pay

## 2019-12-22 MED ORDER — ALBUTEROL SULFATE HFA 108 (90 BASE) MCG/ACT IN AERS: 1.0000 | INHALATION_SPRAY | Freq: Four times a day (QID) | RESPIRATORY_TRACT | 2 refills | Status: DC | PRN

## 2019-12-22 MED ORDER — NEBIVOLOL HCL 5 MG PO TABS: 5.0000 mg | ORAL_TABLET | Freq: Every day | ORAL | 3 refills | Status: DC

## 2019-12-22 MED ORDER — ALBUTEROL SULFATE HFA 108 (90 BASE) MCG/ACT IN AERS: 2.0000 | INHALATION_SPRAY | Freq: Four times a day (QID) | RESPIRATORY_TRACT | 3 refills | Status: DC | PRN

## 2019-12-22 NOTE — Addendum Note (Signed)
Addended by: Crecencio Mc on: 12/22/2019 12:51 PM   Modules accepted: Orders

## 2019-12-22 NOTE — Telephone Encounter (Signed)
PRO AIR SENT

## 2019-12-22 NOTE — Telephone Encounter (Signed)
[facility] called and stated the albuterol HFA was not covered but Proair HFA with the same strength is. please advise new rx.

## 2019-12-22 NOTE — Telephone Encounter (Signed)
*  STAT* If patient is at the pharmacy, call can be transferred to refill team.   1. Which medications need to be refilled? (please list name of each medication and dose if known) Bystolic 5 mg po q d   2. Which pharmacy/location (including street and city if local pharmacy) is medication to be sent to? [facility]   3. Do they need a 30 day or 90 day supply? [place]

## 2020-01-04 ENCOUNTER — Ambulatory Visit (INDEPENDENT_AMBULATORY_CARE_PROVIDER_SITE_OTHER): Payer: Commercial Managed Care - PPO | Admitting: Gastroenterology

## 2020-01-04 DIAGNOSIS — R1310 Dysphagia, unspecified: Secondary | ICD-10-CM

## 2020-01-04 DIAGNOSIS — K219 Gastro-esophageal reflux disease without esophagitis: Secondary | ICD-10-CM | POA: Diagnosis not present

## 2020-01-04 NOTE — Addendum Note (Signed)
Addended by: Dorethea Clan on: 01/04/2020 01:13 PM   Modules accepted: Orders

## 2020-01-04 NOTE — Progress Notes (Signed)
Molly Stone , MD [address]  [place]  [place], [place] [address]  Main: [phone]  Fax: [phone]   Primary Care Physician: Crecencio Mc, MD  Virtual Visit via Video Note  I connected with patient on 01/04/20 at  1:00 PM EDT by video and verified that I am speaking with the correct person using two identifiers.   I discussed the limitations, risks, security and privacy concerns of performing an evaluation and management service by video  and the availability of in person appointments. I also discussed with the patient that there may be a patient responsible charge related to this service. The patient expressed understanding and agreed to proceed.  Location of Patient: Home Location of Provider: Home Persons involved: Patient and provider only   History of Present Illness: Chief Complaint  Patient presents with  . Follow-up    GERD, Dysphagia    HPI: Molly Stone is a 39 y.o. female   Summary of history : She was last seen at my office through a video visit about a year back in March 2020.  At that point of time she had gained about 20 pounds since 2004 and had issues with GERD and dysphagia.  She was taking 40 mg of Protonix once a day before breakfast and had nocturnal symptoms of reflux.No family history of esophageal cancer. She says she has had issues with swallowing for "years" , sporadic, once a month, affects her "spit" , food also gets stuck and doe snot go down. Liquids go down fine. She has seasonal allergies.   08/07/18: EGD: 3 cm hiatal hernia seen , schatzkis ring seen , dilated with 18 F dilator with minimal effect . Esophageal biopsies showed inflammation and no EOE.  In March 2020 she had increased her PPI to twice daily and all her symptoms of dysphagia had resolved.  Had an episode of bronchitis and had symptoms of acid reflux including dysphagia.  Gradually resolved.  She was on Prilosec 40 mg twice a day.   Interval history    01/18/2019-01/04/2020   Doing well , lost weight but regained the weight. Discussed about hiatal hernia repair . Taking protonix BID with Pepcid at night PRN.     Current Outpatient Medications  Medication Sig Dispense Refill  . albuterol (PROAIR HFA) 108 (90 Base) MCG/ACT inhaler Inhale 1-2 puffs into the lungs every 6 (six) hours as needed for wheezing or shortness of breath. 8 g 2  . Cholecalciferol (VITAMIN D3) 125 MCG (5000 UT) TABS Take by mouth.    . famotidine (PEPCID) 20 MG tablet Take 20 mg by mouth 2 (two) times daily.    . fluticasone-salmeterol (ADVAIR HFA) 230-21 MCG/ACT inhaler Inhale 2 puffs into the lungs 2 (two) times daily. 36 g 12  . montelukast (SINGULAIR) 10 MG tablet TAKE 1 TABLET AT BEDTIME 90 tablet 3  . nebivolol (BYSTOLIC) 5 MG tablet Take 1 tablet (5 mg total) by mouth daily. 90 tablet 3  . pantoprazole (PROTONIX) 40 MG tablet TAKE 1 TABLET TWICE A DAY 180 tablet 3  . azithromycin (ZITHROMAX) 250 MG tablet 2 pills day 1 and 1 pill day 2-5 (Patient not taking: Reported on 01/04/2020) 6 tablet 0  . clindamycin-tretinoin (ZIANA) gel     . meloxicam (MOBIC) 15 MG tablet Take 15 mg by mouth daily.    . methocarbamol (ROBAXIN) 500 MG tablet methocarbamol 500 mg tablet  TK 2 TS PO QID    . Multiple Vitamin (MULTIVITAMIN  WITH MINERALS) TABS tablet Take 1 tablet by mouth daily.    . predniSONE (DELTASONE) 20 MG tablet Take 1 tablet (20 mg total) by mouth daily with breakfast. X 5-10 days 10 tablet 0  . propranolol (INDERAL) 20 MG tablet TAKE 1 TABLET BY MOUTH THREE TIMES DAILY AS NEEDED FOR FAST HEART RATE 90 tablet 0  . RHOFADE 1 % CREA      No current facility-administered medications for this visit.    Allergies as of 01/04/2020 - Review Complete 01/04/2020  Allergen Reaction Noted  . Buprenorphine hcl Itching and Other (See Comments) 08/18/2015  . Morphine and related Itching and Other (See Comments) 03/18/2012  . Prednisone Anxiety 06/14/2014  . Tramadol hcl  Other (See Comments) 06/11/2017    Review of Systems:    All systems reviewed and negative except where noted in HPI.  General Appearance:    Alert, cooperative, no distress, appears stated age  Head:    Normocephalic, without obvious abnormality, atraumatic  Eyes:    PERRL, conjunctiva/corneas clear,  Ears:    Grossly normal hearing    Neurologic:  Grossly normal    Observations/Objective:  Labs: CMP     Component Value Date/Time   NA 138 04/08/2019 0949   NA 135 (L) 11/16/2013 1038   K 4.1 04/08/2019 0949   K 3.9 11/16/2013 1038   CL 102 04/08/2019 0949   CL 104 11/16/2013 1038   CO2 23 04/08/2019 0949   CO2 27 11/16/2013 1038   GLUCOSE 101 (H) 04/08/2019 0949   GLUCOSE 105 (H) 12/09/2018 1710   GLUCOSE 78 11/16/2013 1038   BUN 8 04/08/2019 0949   BUN 9 11/16/2013 1038   CREATININE 0.76 04/08/2019 0949   CREATININE 0.61 08/18/2015 1511   CALCIUM 8.8 04/08/2019 0949   CALCIUM 8.5 11/16/2013 1038   PROT 6.7 04/08/2019 0949   ALBUMIN 4.0 04/08/2019 0949   AST 18 04/08/2019 0949   ALT 22 04/08/2019 0949   ALKPHOS 80 04/08/2019 0949   BILITOT 0.5 04/08/2019 0949   GFRNONAA 101 04/08/2019 0949   GFRNONAA >60 11/16/2013 1038   GFRNONAA >89 03/19/2012 0910   GFRAA 116 04/08/2019 0949   GFRAA >60 11/16/2013 1038   GFRAA >89 03/19/2012 0910   Lab Results  Component Value Date   WBC 4.0 07/12/2019   HGB 14.1 07/12/2019   HCT 41.9 07/12/2019   MCV 91.1 07/12/2019   PLT 187.0 07/12/2019    Imaging Studies: No results found.  Assessment and Plan:   Molly Stone is a 39 y.o. y/o female  here to follow up for  GERD and dysphagia. All her symptoms had resolved after increasing Protonix to 40 mg BID.    She has been compliant with her lifestyle changes.  We discussed long-term management would involve gradual weight loss.  She would like to be referred to a dietitian to help lose weight.  We set a target of 3 to 4 months to see if she can lose some weight and we can  reduce the dose of her PPI.  If not possible we need to discuss surgical referral for possible hernia repair or weight loss surgery at the same time.    I discussed the assessment and treatment plan with the patient. The patient was provided an opportunity to ask questions and all were answered. The patient agreed with the plan and demonstrated an understanding of the instructions.   The patient was advised to call back or seek an in-person evaluation  if the symptoms worsen or if the condition fails to improve as anticipated.  I provided 12 minutes of face-to-face time during this encounter.  Dr Molly Bellows MD,MRCP [facility]) Gastroenterology/Hepatology Pager: [phone]   Speech recognition software was used to dictate this note.

## 2020-01-07 ENCOUNTER — Telehealth: Payer: Self-pay | Admitting: Internal Medicine

## 2020-01-07 ENCOUNTER — Ambulatory Visit: Payer: Commercial Managed Care - PPO | Attending: Internal Medicine

## 2020-01-07 DIAGNOSIS — Z23 Encounter for immunization: Secondary | ICD-10-CM

## 2020-01-07 NOTE — Progress Notes (Signed)
   Covid-19 Vaccination Clinic  Name:  Molly Stone    MRN: [id number] DOB: May 06, 1981  01/07/2020  [name] was observed post Covid-19 immunization for 15 minutes without incident. She was provided with Vaccine Information Sheet and instruction to access the V-Safe system.   [name] was instructed to call 911 with any severe reactions post vaccine: Marland Kitchen Difficulty breathing  . Swelling of face and throat  . A fast heartbeat  . A bad rash all over body  . Dizziness and weakness   Immunizations Administered    Name Date Dose VIS Date Route   Pfizer COVID-19 Vaccine 01/07/2020  2:32 PM 0.3 mL 10/01/2019 Intramuscular   Manufacturer: [place]   Lot: [id number]   [place]: [id number]

## 2020-01-07 NOTE — Telephone Encounter (Addendum)
Patient received her first COVID vaccine 1 hour ago she received the pfizer, and having a metallic taste in her mouth about 30 minutes after but denies any swelling or problems swallowing , no other symptoms. Gave patient the number to [facility] reporting and advised her to call and follow there advice. If she has difficulty swallowing , itching throat , or swelling she needs to be seen as soon as possible to go to nearest UC or ER.

## 2020-01-07 NOTE — Telephone Encounter (Signed)
Pt called she just had her first  Vaccine and is having a metallic taste in her mouth  Vaccine Site told her to call her PCP

## 2020-01-21 ENCOUNTER — Encounter: Payer: Self-pay | Admitting: Emergency Medicine

## 2020-01-21 ENCOUNTER — Ambulatory Visit
Admission: EM | Admit: 2020-01-21 | Discharge: 2020-01-21 | Disposition: A | Discharge status: Home / Self Care | Payer: Commercial Managed Care - PPO | Attending: Emergency Medicine | Admitting: Emergency Medicine

## 2020-01-21 ENCOUNTER — Other Ambulatory Visit: Payer: Self-pay

## 2020-01-21 DIAGNOSIS — J029 Acute pharyngitis, unspecified: Secondary | ICD-10-CM | POA: Diagnosis not present

## 2020-01-21 DIAGNOSIS — J039 Acute tonsillitis, unspecified: Secondary | ICD-10-CM | POA: Diagnosis not present

## 2020-01-21 LAB — POCT RAPID STREP A (OFFICE): Rapid Strep A Screen: NEGATIVE

## 2020-01-21 MED ORDER — PREDNISONE 10 MG PO TABS: 40.0000 mg | ORAL_TABLET | Freq: Every day | ORAL | 0 refills | Status: AC

## 2020-01-21 NOTE — ED Provider Notes (Signed)
Molly Stone    CSN: [id number] Arrival date & time: 01/21/20  1338      History   Chief Complaint Chief Complaint  Patient presents with  . Sore Throat    HPI Molly Stone is a 39 y.o. female.   Patient presents with sore throat, swollen tonsils, swollen lymph nodes in her neck x 3-4 days.  She also reports intermittent difficulty swallowing due to "fullness" in her tonsils.  She reports she has seen ENT in the recent past and was told she needs her tonsils removed.  She denies fever, cough, shortness of breath, vomiting, diarrhea, rash, or other symptoms.  Patient reports last antibiotic use was amoxicillin 1 month ago.    The history is provided by the patient.    Past Medical History:  Diagnosis Date  . Allergy   . Anemia   . Anxiety   . Asthma   . Chlamydia    h/o  . DDD (degenerative disc disease), cervical 04/07/2019  . Disorder of vocal cord    spasmodic dysphonia  . Dysmenorrhea   . Dyspareunia, female   . Endometriosis   . Family history of endometriosis   . GERD (gastroesophageal reflux disease)   . Headache    migraine  . Heavy periods   . History of nephrolithiasis   . Hypertension   . Jaundice, physiologic, newborn   . Obesity (BMI 30-39.9)   . Sleep apnea   . Tobacco user   . Vaginal Pap smear, abnormal     Patient Active Problem List   Diagnosis Date Noted  . Bronchitis with acute wheezing 08/16/2019  . Neutropenia ([place]) 07/12/2019  . Colicky LUQ abdominal pain 06/21/2019  . DDD (degenerative disc disease), cervical 04/07/2019  . Hyperlipidemia 04/07/2019  . Abnormal MRI, cervical spine 04/07/2019  . Right calf pain 03/24/2019  . Dizziness 03/23/2019  . Paroxysmal tachycardia ([place]) 01/28/2019  . Shortness of breath 01/28/2019  . Anxiety 01/28/2019  . SVT (supraventricular tachycardia) ([place]) 09/12/2018  . Chronic bilateral thoracic back pain 03/07/2017  . Spinal stenosis 03/05/2017  . Concussion without loss of consciousness  08/04/2016  . OSA (obstructive sleep apnea) 07/24/2016  . Generalized anxiety disorder 10/25/2015  . Family history of endometriosis 10/12/2015  . Dysmenorrhea 10/12/2015  . H/O renal calculi 08/15/2015  . Morbid obesity ([place]) 08/15/2015  . Encounter for preventive health examination 06/16/2014  . Sciatica 04/01/2014  . Vitamin D deficiency 04/12/2012  . 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  . GERD (gastroesophageal reflux disease)     Past Surgical History:  Procedure Laterality Date  . DILATATION & CURETTAGE/HYSTEROSCOPY WITH MYOSURE N/A 08/27/2018   Procedure: DILATATION & CURETTAGE/HYSTEROSCOPY WITH MINERVA;  Surgeon: Gae Dry, MD;  Location: [facility] ORS;  Service: Gynecology;  Laterality: N/A;  . DILATION AND CURETTAGE OF UTERUS    . ESOPHAGOGASTRODUODENOSCOPY (EGD) WITH PROPOFOL N/A 08/07/2018   Procedure: ESOPHAGOGASTRODUODENOSCOPY (EGD) WITH PROPOFOL;  Surgeon: Jonathon Bellows, MD;  Location: [facility] ENDOSCOPY;  Service: Gastroenterology;  Laterality: N/A;  . laparoscopy    . ROOT CANAL    . WISDOM TOOTH EXTRACTION      OB History    Gravida  6   Para  3   Term  3   Preterm      AB  3   Living  2     SAB  2   TAB  1   Ectopic      Multiple      Live Births  2  Home Medications    Prior to Admission medications   Medication Sig Start Date End Date Taking? Authorizing Provider  albuterol (PROAIR HFA) 108 (90 Base) MCG/ACT inhaler Inhale 1-2 puffs into the lungs every 6 (six) hours as needed for wheezing or shortness of breath. 12/22/19  Yes Crecencio Mc, MD  Cholecalciferol (VITAMIN D3) 125 MCG (5000 UT) TABS Take by mouth.   Yes [provider]  famotidine (PEPCID) 20 MG tablet Take 20 mg by mouth 2 (two) times daily.   Yes [provider]  fluticasone-salmeterol (ADVAIR HFA) 230-21 MCG/ACT inhaler Inhale 2 puffs into the lungs 2 (two) times daily. 03/22/19  Yes Crecencio Mc, MD  meloxicam (MOBIC) 15 MG tablet Take 15 mg by  mouth daily. 09/08/19  Yes [provider]  methocarbamol (ROBAXIN) 500 MG tablet methocarbamol 500 mg tablet  TK 2 TS PO QID   Yes [provider]  montelukast (SINGULAIR) 10 MG tablet TAKE 1 TABLET AT BEDTIME 11/23/19  Yes Crecencio Mc, MD  Multiple Vitamin (MULTIVITAMIN WITH MINERALS) TABS tablet Take 1 tablet by mouth daily.   Yes [provider]  nebivolol (BYSTOLIC) 5 MG tablet Take 1 tablet (5 mg total) by mouth daily. 12/22/19  Yes Minna Merritts, MD  pantoprazole (PROTONIX) 40 MG tablet TAKE 1 TABLET TWICE A DAY 06/22/19  Yes Jonathon Bellows, MD  azithromycin (ZITHROMAX) 250 MG tablet 2 pills day 1 and 1 pill day 2-5 Patient not taking: Reported on 01/04/2020 11/05/19   McLean-Scocuzza, Nino Glow, MD  clindamycin-tretinoin Pershing Proud) gel  07/26/19   [provider]  predniSONE (DELTASONE) 10 MG tablet Take 4 tablets (40 mg total) by mouth daily for 5 days. 01/21/20 01/26/20  Sharion Balloon, NP  propranolol (INDERAL) 20 MG tablet TAKE 1 TABLET BY MOUTH THREE TIMES DAILY AS NEEDED FOR FAST HEART RATE 02/04/19   Minna Merritts, MD  RHOFADE 1 % CREA  06/29/19   [provider]    Family History Family History  Problem Relation Age of Onset  . Endometriosis Mother   . Cancer Father        benign liver CA  . Stroke Father        recurrent blood clots on the brain   . Lupus Father   . Cancer Maternal Grandmother 30       ovarian Ca, still surviving   . Osteoporosis Maternal Grandmother   . Ovarian cancer Maternal Grandmother   . Heart disease Maternal Grandfather   . Cancer Maternal Grandfather 17       colon CA. metastatic  . Diabetes Maternal Uncle   . Heart disease Paternal Grandmother   . Breast cancer Neg Hx   . Colon cancer Neg Hx   . Kidney cancer Neg Hx   . Bladder Cancer Neg Hx     Social History Social History   Tobacco Use  . Smoking status: Former Smoker    Years: 10.00    Types: Cigarettes    Quit date: 10/2016    Years since  quitting: 3.2  . Smokeless tobacco: Never Used  Substance Use Topics  . Alcohol use: Not Currently    Alcohol/week: 0.0 standard drinks  . Drug use: No     Allergies   Buprenorphine hcl, Morphine and related, Prednisone, and Tramadol hcl   Review of Systems Review of Systems  Constitutional: Negative for chills and fever.  HENT: Positive for sore throat and trouble swallowing. Negative for congestion, ear  pain and rhinorrhea.   Eyes: Negative for pain and visual disturbance.  Respiratory: Negative for cough and shortness of breath.   Cardiovascular: Negative for chest pain and palpitations.  Gastrointestinal: Negative for abdominal pain, diarrhea, nausea and vomiting.  Genitourinary: Negative for dysuria and hematuria.  Musculoskeletal: Negative for arthralgias and back pain.  Skin: Negative for color change and rash.  Neurological: Negative for seizures and syncope.  All other systems reviewed and are negative.    Physical Exam Triage Vital Signs ED Triage Vitals  Enc Vitals Group     BP      Pulse      Resp      Temp      Temp src      SpO2      Weight      Height      Head Circumference      Peak Flow      Pain Score      Pain Loc      Pain Edu?      Excl. in [place]?    No data found.  Updated Vital Signs BP 113/74 (BP Location: Left Arm)   Pulse 81   Temp 99.1 F (37.3 C) (Oral)   Resp 18   Ht 5' 9.5" (1.765 m)   Wt 266 lb (120.7 kg)   LMP 01/05/2020 (Approximate)   SpO2 97%   BMI 38.72 kg/m   Visual Acuity Right Eye Distance:   Left Eye Distance:   Bilateral Distance:    Right Eye Near:   Left Eye Near:    Bilateral Near:     Physical Exam Vitals and nursing note reviewed.  Constitutional:      General: She is not in acute distress.    Appearance: She is well-developed. She is not ill-appearing.  HENT:     Head: Normocephalic and atraumatic.     Right Ear: Tympanic membrane normal.     Left Ear: Tympanic membrane normal.     Nose: Nose  normal.     Mouth/Throat:     Mouth: Mucous membranes are moist.     Pharynx: Uvula midline. Posterior oropharyngeal erythema present. No oropharyngeal exudate or uvula swelling.     Tonsils: No tonsillar exudate. 3+ on the right. 3+ on the left.     Comments: Voice clear.  No difficulty swallowing. Eyes:     Conjunctiva/sclera: Conjunctivae normal.  Cardiovascular:     Rate and Rhythm: Normal rate and regular rhythm.     Heart sounds: No murmur.  Pulmonary:     Effort: Pulmonary effort is normal. No respiratory distress.     Breath sounds: Normal breath sounds.  Abdominal:     General: Bowel sounds are normal.     Palpations: Abdomen is soft.     Tenderness: There is no abdominal tenderness. There is no guarding or rebound.  Musculoskeletal:     Cervical back: Neck supple.  Skin:    General: Skin is warm and dry.     Findings: No rash.  Neurological:     General: No focal deficit present.     Mental Status: She is alert and oriented to person, place, and time.  Psychiatric:        Mood and Affect: Mood normal.        Behavior: Behavior normal.      UC Treatments / Results  Labs (all labs ordered are listed, but only abnormal results are displayed) Labs Reviewed  CULTURE, GROUP  A STREP [facility])  POCT RAPID STREP A (OFFICE)    EKG   Radiology No results found.  Procedures Procedures (including critical care time)  Medications Ordered in UC Medications - No data to display  Initial Impression / Assessment and Plan / UC Course  I have reviewed the triage vital signs and the nursing notes.  Pertinent labs & imaging results that were available during my care of the patient were reviewed by me and considered in my medical decision making (see chart for details).   Tonsillitis, sore throat.  Rapid strep negative; throat culture pending.  Treating with prednisone x5 days.  Instructed patient to take Tylenol as needed for discomfort.  Instructed her to follow-up with  her ENT next week.  Instructed her to go to the emergency department right away if she has difficulty swallowing or breathing.  Patient agrees to plan of care.     Final Clinical Impressions(s) / UC Diagnoses   Final diagnoses:  Tonsillitis  Sore throat     Discharge Instructions     Your rapid strep test is negative.  A throat culture is pending; we will call you if it is positive requiring treatment.    Take Tylenol or ibuprofen as needed for discomfort.  Take the prednisone as directed.    Go to the emergency department if you have difficulty swallowing or breathing.        ED Prescriptions    Medication Sig Dispense Auth. Provider   predniSONE (DELTASONE) 10 MG tablet Take 4 tablets (40 mg total) by mouth daily for 5 days. 20 tablet Sharion Balloon, NP     PDMP not reviewed this encounter.   Sharion Balloon, NP 01/21/20 1434

## 2020-01-21 NOTE — Discharge Instructions (Addendum)
Your rapid strep test is negative.  A throat culture is pending; we will call you if it is positive requiring treatment.    Take Tylenol or ibuprofen as needed for discomfort.  Take the prednisone as directed.    Go to the emergency department if you have difficulty swallowing or breathing.

## 2020-01-21 NOTE — ED Triage Notes (Signed)
Pt c/o sore throat, trouble swallowing, and lymph node swelling. Started about 3-4 days ago. She states that she had a yellow spot on the back of her throat. Denies fever.

## 2020-01-24 ENCOUNTER — Telehealth: Payer: Self-pay | Admitting: Internal Medicine

## 2020-01-24 LAB — CULTURE, GROUP A STREP (THRC)

## 2020-01-24 NOTE — Telephone Encounter (Signed)
[place] RECORD AccessNurse Patient Name: LONISHA DRINKARD Gender: Female DOB: [birth year] Age: 39 Y 30 M 1 D Return Phone Number: [phone] (Primary) Address: City/State/Zip: [place] [address] Client [facility] [place] [facility] - [place] Client Site [place] - Day Physician Deborra Medina - MD Contact Type Call Who Is Calling Patient / Member / Family / Caregiver Call Type Triage / Clinical Relationship To Patient Self Return Phone Number [phone] (Primary) Chief Complaint Sore Throat Reason for Call Symptomatic / Request for [place] states she would like to schedule an appointment. Caller states she believes she has tonsillitis. Translation No Nurse Assessment Nurse: Kathi Ludwig, RN, Leana Roe Date/Time Eilene Ghazi Time): 01/21/2020 12:44:05 PM Confirm and document reason for call. If symptomatic, describe symptoms. ---Caller states yellow on right tonsil. throat red. difficulty swallowing 3 days ago. was drinking broth. glands are swollen. fatigue. temp 97.5 Has the patient had close contact with a person known or suspected to have the novel coronavirus illness OR traveled / lives in area with major community spread (including international travel) in the last 14 days from the onset of symptoms? * If Asymptomatic, screen for exposure and travel within the last 14 days. ---No Does the patient have any new or worsening symptoms? ---Yes Will a triage be completed? ---Yes Related visit to physician within the last 2 weeks? ---No Does the PT have any chronic conditions? (i.e. diabetes, asthma, this includes High risk factors for pregnancy, etc.) ---Yes List chronic conditions. ---asthma Is the patient pregnant or possibly pregnant? (Ask all females between the ages of 66-55) ---No Is this a behavioral health or substance abuse call?  ---No Guidelines Guideline Title Affirmed Question Affirmed Notes Nurse Date/Time (Eastern Time) Sore Throat [1] Sore throat is the only symptom AND [2] present > 48 hours Kathi Ludwig, RN, Leana Roe 01/21/2020 12:46:26 PMPLEASE NOTE: All timestamps contained within this report are represented as [place] Standard Time. CONFIDENTIALTY NOTICE: This fax transmission is intended only for the addressee. It contains information that is legally privileged, confidential or otherwise protected from use or disclosure. If you are not the intended recipient, you are strictly prohibited from reviewing, disclosing, copying using or disseminating any of this information or taking any action in reliance on or regarding this information. If you have received this fax in error, please notify us immediately by telephone so that we can arrange for its return to [place]. Phone: [phone], Toll-Free: [phone], Fax: [phone] Page: 2 of 2 Call Id: [id number] [place]. Time Eilene Ghazi Time) Disposition Final User 01/21/2020 12:28:24 PM Send To RN Personal Myles Gip, RN, Haynes Dage 01/21/2020 12:49:38 PM SEE PCP WITHIN 3 DAYS Yes Kathi Ludwig, RN, Minerva Ends Disagree/Comply Comply Caller Understands Yes PreDisposition Did not know what to do Care Advice Given Per Guideline SEE PCP WITHIN 3 DAYS: * You need to be seen within 2 or 3 days. Call your doctor (or NP/PA) during regular office hours and make an appointment. A clinic or urgent care center are good places to go for care if your doctor's office is closed or you can't get an appointment. NOTE: If office will be open tomorrow, tell caller to call then, not in 3 days. SORE THROAT: CALL BACK IF: * You become worse. CARE ADVICE given per Sore Throat (Adult) guideline. DRINK PLENTY LIQUIDS: * Drink plenty of liquids. This is important to prevent dehydration. * A healthy adult should drink 8 cups (240 ml) or more of liquid each day.  SOFT DIET: * Eat a soft diet. * Cold drinks, popsicles,  and milk shakes are especially good. Avoid citrus fruits. * IBUPROFEN (E.G., MOTRIN, ADVIL): Take 400 mg (two 200 mg pills) by mouth every 6 hours. The most you should take each day is 1,200 mg (six 200 mg pills), unless your doctor has told you to take more. PAIN AND FEVER MEDICINES

## 2020-01-24 NOTE — Telephone Encounter (Signed)
Patient was seen at [facility] for call as outlined in triage from Access Nurse. 01/21/20.

## 2020-02-01 ENCOUNTER — Ambulatory Visit: Payer: Commercial Managed Care - PPO | Attending: Internal Medicine

## 2020-02-01 DIAGNOSIS — Z23 Encounter for immunization: Secondary | ICD-10-CM

## 2020-02-01 NOTE — Progress Notes (Signed)
   Covid-19 Vaccination Clinic  Name:  Molly Stone    MRN: [id number] DOB: 19-Sep-1981  02/01/2020  Molly Stone was observed post Covid-19 immunization for 30 minutes based on pre-vaccination screening .  During the observation period, she experienced an adverse reaction with the following symptoms:  Throat tightness and facial flushing.  Assessment : Time of assessment 1445. Alert and oriented, Anxious and Diaphoretic.  Actions taken:  EMS on site and hand off completed to Paramedic (Name Einar Pheasant). VAERS form completed Allergy documented  There were no vitals filed for this visit.  Medications administered: No medication administered.  Disposition:Instructed to follow up with PCP  for evaluation for second dose. Instructed to call 911 for trouble breathing, rapid heart rate, dizziness, swelling of tongue or throat. The Patient was provided with Vaccine Information Sheet and instruction to access the V-Safe system.    Immunizations Administered    Name Date Dose VIS Date Route   Pfizer COVID-19 Vaccine 02/01/2020  2:36 PM 0.3 mL 10/01/2019 Intramuscular   Manufacturer: [place]   Lot: [id number]   NDC: [id number]     Pt was immediately transferred to EMS upon team notification of symptoms. EMS instructed that it was safe to take diphenhydramine that the patient brought in with them.

## 2020-02-03 ENCOUNTER — Encounter: Payer: Self-pay | Admitting: *Deleted

## 2020-02-03 ENCOUNTER — Telehealth: Payer: Self-pay | Admitting: *Deleted

## 2020-02-03 NOTE — Telephone Encounter (Signed)
Sharrie called to say she got her second covid shot. She said that she is fine just having hot flashes.

## 2020-02-03 NOTE — Telephone Encounter (Signed)
Returned patient's phone call and spoke with the patient and she stated that after receiving the first vaccine she started getting really hot and this continued to be an issue all the way into receiving her second vaccine. She stated that when she received the second vaccine she experienced throat tightness like it was very very dry but she states that she was very well hydrated prior. She states that her face got very hot and flushed and she said that she has still been having these hot flash episodes 2 days later after receiving her second vaccine. She said to her knowledge she has not had any issues to Polythylene Glycol or Polysorbate. She said when she gets the flu shot sometimes she experiences a little tightness in her throat, the only injectable medication she said she had an issue with was Morphine and it cause her BP and Oxygen level to drop. She has had Miralax with no issues and she denies any facial fillers. Please advise since the patient is still having hot flashes after vaccine occurrence I just wanted to follow up on this. Thank You.

## 2020-02-03 NOTE — Telephone Encounter (Signed)
Attempted to call patient in regards to her second COVID vaccine that she received on April 13th. Attempted to leave voicemail but the line went dead. MyChart message sent. Will follow up.

## 2020-02-04 NOTE — Telephone Encounter (Signed)
I'm not sure what to make of the flushing. If it does not improve, recommend to see her PCP regarding that.   However, the throat tightness is more concerning for an allergic reaction symptom.   If there's a booster needed in the future for the COVID-19 vaccine recommend that she calls our office or gets an evaluation by allergy prior to to receiving any other COVID-19 vaccines.  If interested, we can do testing to some of the components of the vaccine however, even if it's negative we would recommend avoidance as of now. We don't have the actual vaccine to use for testing at this time. Maybe in the future we will have better testing available for the covid-19 vaccines.

## 2020-02-04 NOTE — Telephone Encounter (Signed)
Called patient and advised. Patient verbalized understanding and states that she has an appointment with her PCP this coming Monday. She verbalized understanding about future booster and will call our office for further guidance at that time.

## 2020-02-07 ENCOUNTER — Encounter: Payer: Self-pay | Admitting: Internal Medicine

## 2020-02-07 ENCOUNTER — Other Ambulatory Visit: Payer: Self-pay

## 2020-02-07 ENCOUNTER — Ambulatory Visit: Payer: Commercial Managed Care - PPO | Admitting: Internal Medicine

## 2020-02-07 VITALS — BP 120/88 | HR 93 | Temp 97.2°F | Resp 15 | Ht 69.5 in | Wt 273.2 lb

## 2020-02-07 DIAGNOSIS — R232 Flushing: Secondary | ICD-10-CM | POA: Diagnosis not present

## 2020-02-07 DIAGNOSIS — Z91018 Allergy to other foods: Secondary | ICD-10-CM | POA: Diagnosis not present

## 2020-02-07 DIAGNOSIS — I479 Paroxysmal tachycardia, unspecified: Secondary | ICD-10-CM

## 2020-02-07 DIAGNOSIS — E559 Vitamin D deficiency, unspecified: Secondary | ICD-10-CM

## 2020-02-07 DIAGNOSIS — E782 Mixed hyperlipidemia: Secondary | ICD-10-CM

## 2020-02-07 DIAGNOSIS — I471 Supraventricular tachycardia: Secondary | ICD-10-CM

## 2020-02-07 LAB — CBC WITH DIFFERENTIAL/PLATELET
Basophils Absolute: 0 10*3/uL (ref 0.0–0.1)
Basophils Relative: 1.1 % (ref 0.0–3.0)
Eosinophils Absolute: 0.1 10*3/uL (ref 0.0–0.7)
Eosinophils Relative: 4 % (ref 0.0–5.0)
HCT: 42.1 % (ref 36.0–46.0)
Hemoglobin: 14.3 g/dL (ref 12.0–15.0)
Lymphocytes Relative: 50.2 % — ABNORMAL HIGH (ref 12.0–46.0)
Lymphs Abs: 1.4 10*3/uL (ref 0.7–4.0)
MCHC: 33.9 g/dL (ref 30.0–36.0)
MCV: 90 fl (ref 78.0–100.0)
Monocytes Absolute: 0.4 10*3/uL (ref 0.1–1.0)
Monocytes Relative: 14.5 % — ABNORMAL HIGH (ref 3.0–12.0)
Neutro Abs: 0.8 10*3/uL — ABNORMAL LOW (ref 1.4–7.7)
Neutrophils Relative %: 30.2 % — ABNORMAL LOW (ref 43.0–77.0)
Platelets: 163 10*3/uL (ref 150.0–400.0)
RBC: 4.68 Mil/uL (ref 3.87–5.11)
RDW: 13 % (ref 11.5–15.5)
WBC: 2.7 10*3/uL — ABNORMAL LOW (ref 4.0–10.5)

## 2020-02-07 LAB — COMPREHENSIVE METABOLIC PANEL
ALT: 16 U/L (ref 0–35)
AST: 17 U/L (ref 0–37)
Albumin: 4.1 g/dL (ref 3.5–5.2)
Alkaline Phosphatase: 57 U/L (ref 39–117)
BUN: 13 mg/dL (ref 6–23)
CO2: 28 mEq/L (ref 19–32)
Calcium: 8.9 mg/dL (ref 8.4–10.5)
Chloride: 101 mEq/L (ref 96–112)
Creatinine, Ser: 0.8 mg/dL (ref 0.40–1.20)
GFR: 80.29 mL/min (ref >60.00)
Glucose, Bld: 94 mg/dL (ref 70–99)
Potassium: 4 mEq/L (ref 3.5–5.1)
Sodium: 135 mEq/L (ref 135–145)
Total Bilirubin: 0.6 mg/dL (ref 0.2–1.2)
Total Protein: 7.4 g/dL (ref 6.0–8.3)

## 2020-02-07 LAB — TSH: TSH: 0.78 u[IU]/mL (ref 0.35–4.50)

## 2020-02-07 LAB — LUTEINIZING HORMONE: LH: 5.46 m[IU]/mL

## 2020-02-07 LAB — FOLLICLE STIMULATING HORMONE: FSH: 8.9 m[IU]/mL

## 2020-02-07 LAB — VITAMIN D 25 HYDROXY (VIT D DEFICIENCY, FRACTURES): VITD: 39.43 ng/mL (ref 30.00–100.00)

## 2020-02-07 NOTE — Assessment & Plan Note (Signed)
Occurring in the setting of recent [place] 19 vaccination.and accompanied by metrorrhea (menses ever 14 days, now every 21 das).  Checking TSH  FSH/LH.  aksed to keep diary of symptoms with relation to eating,  stooling

## 2020-02-07 NOTE — Assessment & Plan Note (Signed)
Normal ECHO Jan 2020 .  Continue metoprolol

## 2020-02-07 NOTE — Assessment & Plan Note (Signed)
Mild,  No indication for treatment at this level

## 2020-02-07 NOTE — Patient Instructions (Signed)
Your flushing may be due to hormones.   I am checking them today

## 2020-02-07 NOTE — Progress Notes (Signed)
Subjective:  Patient ID: Molly Stone, female    DOB: 1981-04-16  Age: 39 y.o. MRN: [id number]  CC: The primary encounter diagnosis was Vasomotor flushing. Diagnoses of Vitamin D deficiency, Paroxysmal tachycardia ([place]), Multiple food allergies, SVT (supraventricular tachycardia) ([place]), and Mixed hyperlipidemia were also pertinent to this visit.  HPI Molly Stone presents for follow up on chronic conditions    This visit occurred during the SARS-CoV-2 public health emergency.  Safety protocols were in place, including screening questions prior to the visit, additional usage of staff PPE, and extensive cleaning of exam room while observing appropriate contact time as indicated for disinfecting solutions.    Patient has received both doses of the [place] 19 vaccine with complications. Developed throat tightness,  Dryness of  throat after the 2nd dose ,  and persistent flushing /sweats that started after the first dose and continues to occur 2-3 times daily with activity .     Patient continues to mask when outside of the home except when walking in yard or at safe distances from others .  Patient denies any change in mood or development of unhealthy behaviors resuting from the pandemic's restriction of activities and socialization.    Treated for tonsillitis by ED on April 2  Went for allergy testing by Dr Tami Ribas  and has multiple food allergies (malt, soybean, peanuts, corn,  wheat , and onions)nby testing by Dr Ileene Hutchinson office.  Also has environmental allergies.  Advised to avoid these foods  Also advised NOT to get any more COVID 19 vaccine (booster) .  Has modified diet ,  Fresh vegetables, grass fed beef,  Chicken.  Feeling better .  Starting allergic immunotherapy with sublingual drops . Advised not go go outside per [place] during pollen season .  Was given 2 epi pens  takign vitamin d 5000 ius daily   Menstrual irregularity started in January , period occurred Every 14  days in January,  Now every 21 days.  (recent change)    Younger brother diagnosed with CAD hereditary  At age 82.  History of recurrent syncope led him to cardiology, reviewed patient's lipid panel       Outpatient Medications Prior to Visit  Medication Sig Dispense Refill  . albuterol (PROAIR HFA) 108 (90 Base) MCG/ACT inhaler Inhale 1-2 puffs into the lungs every 6 (six) hours as needed for wheezing or shortness of breath. 8 g 2  . Cholecalciferol (VITAMIN D3) 125 MCG (5000 UT) TABS Take by mouth.    . clindamycin-tretinoin (ZIANA) gel     . famotidine (PEPCID) 20 MG tablet Take 20 mg by mouth 2 (two) times daily.    . fluticasone-salmeterol (ADVAIR HFA) 230-21 MCG/ACT inhaler Inhale 2 puffs into the lungs 2 (two) times daily. 36 g 12  . montelukast (SINGULAIR) 10 MG tablet TAKE 1 TABLET AT BEDTIME 90 tablet 3  . nebivolol (BYSTOLIC) 5 MG tablet Take 1 tablet (5 mg total) by mouth daily. 90 tablet 3  . pantoprazole (PROTONIX) 40 MG tablet TAKE 1 TABLET TWICE A DAY 180 tablet 3  . propranolol (INDERAL) 20 MG tablet TAKE 1 TABLET BY MOUTH THREE TIMES DAILY AS NEEDED FOR FAST HEART RATE 90 tablet 0  . RHOFADE 1 % CREA     . azithromycin (ZITHROMAX) 250 MG tablet 2 pills day 1 and 1 pill day 2-5 (Patient not taking: Reported on 01/04/2020) 6 tablet 0  . meloxicam (MOBIC) 15 MG tablet Take 15 mg by mouth daily.    Marland Kitchen  methocarbamol (ROBAXIN) 500 MG tablet methocarbamol 500 mg tablet  TK 2 TS PO QID    . Multiple Vitamin (MULTIVITAMIN WITH MINERALS) TABS tablet Take 1 tablet by mouth daily.     No facility-administered medications prior to visit.    Review of Systems;  Patient denies headache, fevers, malaise, unintentional weight loss, skin rash, eye pain, sinus congestion and sinus pain, sore throat, dysphagia,  hemoptysis , cough, dyspnea, wheezing, chest pain, palpitations, orthopnea, edema, abdominal pain, nausea, melena, diarrhea, constipation, flank pain, dysuria, hematuria, urinary   Frequency, nocturia, numbness, tingling, seizures,  Focal weakness, Loss of consciousness,  Tremor, insomnia, depression, anxiety, and suicidal ideation.      Objective:  BP 120/88 (BP Location: Left Arm, Patient Position: Sitting, Cuff Size: Normal)   Pulse 93   Temp (!) 97.2 F (36.2 C) (Temporal)   Resp 15   Ht 5' 9.5" (1.765 m)   Wt 273 lb 3.2 oz (123.9 kg)   SpO2 97%   BMI 39.77 kg/m   BP Readings from Last 3 Encounters:  02/07/20 120/88  01/21/20 113/74  11/04/19 121/83    Wt Readings from Last 3 Encounters:  02/07/20 273 lb 3.2 oz (123.9 kg)  01/21/20 266 lb (120.7 kg)  11/05/19 266 lb (120.7 kg)    General appearance: alert, cooperative and appears stated age Ears: normal TM's and external ear canals both ears Throat: lips, mucosa, and tongue normal; teeth and gums normal Neck: no adenopathy, no carotid bruit, supple, symmetrical, trachea midline and thyroid not enlarged, symmetric, no tenderness/mass/nodules Back: symmetric, no curvature. ROM normal. No CVA tenderness. Lungs: clear to auscultation bilaterally Heart: regular rate and rhythm, S1, S2 normal, no murmur, click, rub or gallop Abdomen: soft, non-tender; bowel sounds normal; no masses,  no organomegaly Pulses: 2+ and symmetric Skin: Skin color, texture, turgor normal. No rashes or lesions Lymph nodes: Cervical, supraclavicular, and axillary nodes normal.  Lab Results  Component Value Date   HGBA1C 5.2 04/08/2019    Lab Results  Component Value Date   CREATININE 0.76 04/08/2019   CREATININE 0.83 12/09/2018   CREATININE 0.70 09/17/2018    Lab Results  Component Value Date   WBC 4.0 07/12/2019   HGB 14.1 07/12/2019   HCT 41.9 07/12/2019   PLT 187.0 07/12/2019   GLUCOSE 101 (H) 04/08/2019   CHOL 205 (H) 09/02/2019   TRIG 57.0 09/02/2019   HDL 43.90 09/02/2019   LDLDIRECT 155 (H) 08/18/2015   LDLCALC 149 (H) 09/02/2019   ALT 22 04/08/2019   AST 18 04/08/2019   NA 138 04/08/2019   K 4.1  04/08/2019   CL 102 04/08/2019   CREATININE 0.76 04/08/2019   BUN 8 04/08/2019   CO2 23 04/08/2019   TSH 1.47 09/02/2019   HGBA1C 5.2 04/08/2019    No results found.  Assessment & Plan:   Problem List Items Addressed This Visit      Unprioritized   Vitamin D deficiency   Relevant Orders   VITAMIN D 25 Hydroxy (Vit-D Deficiency, Fractures)   Paroxysmal tachycardia ([place])    Cardiac workup negative   Normal ECHO Jan 2020.  Continue nebivolol       Relevant Orders   CBC with Differential/Platelet   Comprehensive metabolic panel   Hyperlipidemia    Mild,  No indication for treatment at this level       Multiple food allergies    She has modified her diet and is carrying an epi pen  Vasomotor flushing - Primary    Occurring in the setting of recent COVID 19 vaccination.and accompanied by metrorrhea (menses ever 14 days, now every 21 das).  Checking TSH  FSH/LH.  aksed to keep diary of symptoms with relation to eating,  stooling        Relevant Orders   TSH   Follicle stimulating hormone   LH   RESOLVED: SVT (supraventricular tachycardia) ([place])    Normal ECHO Jan 2020 .  Continue metoprolol          I provided  30 minutes of  face-to-face time during this encounter reviewing patient's current problems and past surgeries,  Explaining recent labs and imaging studies, providing counseling on the above mentioned problems , and coordination  of care .  I have discontinued Farzana M. Caba's multivitamin with minerals, meloxicam, methocarbamol, and azithromycin. I am also having her maintain her propranolol, fluticasone-salmeterol, Vitamin D3, pantoprazole, famotidine, clindamycin-tretinoin, Rhofade, montelukast, albuterol, and nebivolol.  No orders of the defined types were placed in this encounter.   Medications Discontinued During This Encounter  Medication Reason  . azithromycin (ZITHROMAX) 250 MG tablet Completed Course  . meloxicam (MOBIC) 15 MG tablet  Patient Preference  . methocarbamol (ROBAXIN) 500 MG tablet Patient Preference  . Multiple Vitamin (MULTIVITAMIN WITH MINERALS) TABS tablet Patient Preference    Follow-up: No follow-ups on file.   Crecencio Mc, MD

## 2020-02-07 NOTE — Assessment & Plan Note (Signed)
Cardiac workup negative   Normal ECHO Jan 2020.  Continue nebivolol

## 2020-02-07 NOTE — Assessment & Plan Note (Signed)
She has modified her diet and is carrying an epi pen

## 2020-02-08 ENCOUNTER — Other Ambulatory Visit: Payer: Self-pay | Admitting: Internal Medicine

## 2020-02-08 DIAGNOSIS — D702 Other drug-induced agranulocytosis: Secondary | ICD-10-CM

## 2020-02-24 ENCOUNTER — Other Ambulatory Visit: Payer: Self-pay | Admitting: Cardiovascular Disease

## 2020-02-26 ENCOUNTER — Emergency Department
Admission: EM | Admit: 2020-02-26 | Discharge: 2020-02-26 | Disposition: A | Discharge status: Home / Self Care | Payer: Commercial Managed Care - PPO | Attending: Emergency Medicine | Admitting: Emergency Medicine

## 2020-02-26 ENCOUNTER — Other Ambulatory Visit: Payer: Self-pay

## 2020-02-26 DIAGNOSIS — R0989 Other specified symptoms and signs involving the circulatory and respiratory systems: Secondary | ICD-10-CM | POA: Diagnosis present

## 2020-02-26 DIAGNOSIS — J45909 Unspecified asthma, uncomplicated: Secondary | ICD-10-CM | POA: Insufficient documentation

## 2020-02-26 DIAGNOSIS — T7840XA Allergy, unspecified, initial encounter: Secondary | ICD-10-CM | POA: Insufficient documentation

## 2020-02-26 DIAGNOSIS — I10 Essential (primary) hypertension: Secondary | ICD-10-CM | POA: Insufficient documentation

## 2020-02-26 DIAGNOSIS — Z87891 Personal history of nicotine dependence: Secondary | ICD-10-CM | POA: Diagnosis not present

## 2020-02-26 DIAGNOSIS — Z79899 Other long term (current) drug therapy: Secondary | ICD-10-CM | POA: Diagnosis not present

## 2020-02-26 MED ORDER — DEXAMETHASONE SODIUM PHOSPHATE 10 MG/ML IJ SOLN
10.0000 mg | Freq: Once | INTRAMUSCULAR | Status: AC
  Administered 2020-02-26: 10 mg via INTRAMUSCULAR
  Filled 2020-02-26: qty 1

## 2020-02-26 NOTE — ED Notes (Signed)
No reaction noted to injection site.  

## 2020-02-26 NOTE — ED Provider Notes (Signed)
-----------------------------------------   5:57 PM on 02/26/2020 -----------------------------------------  I have personally seen and evaluated the patient in conjunction with nurse practitioner [facility].  Overall patient appears very well.  No respiratory difficulty no wheeze.  No hives.  Denies any throat swelling sensation.  Is not clear at this time whether this was a true allergic reaction or possible anxiety response.  We will dose Decadron intramuscularly, discharged with Benadryl every 6 hours as needed.  Discussed return precautions.  Patient agreeable to plan of care.   Harvest Dark, MD 02/26/20 1757

## 2020-02-26 NOTE — ED Triage Notes (Signed)
Pt arrives POV from home for reports of possible allergic reaction after getting SL allergy drops around 3:30pm. Pt states she has been using the drops everyday for the last 6 days and today she took an increased dose. Pt reports her throat feels "tighter than usual and dry". Pt speaking in complete sentences without SHOB. No rash observed and pt reports she is not itching anywhere. Pt reports she took benadryl before taking the drops. Pt's throat does not appear to be swollen or red and she does not have any evidence of facial swelling.

## 2020-02-26 NOTE — ED Notes (Signed)
EKG reviewed and signed by Dr. Quentin Cornwall

## 2020-03-07 NOTE — ED Provider Notes (Signed)
[facility] Emergency Department Provider Note ____________________________________________   First MD Initiated Contact with Patient 02/26/20 1742     (approximate)  I have reviewed the triage vital signs and the nursing notes.   HISTORY  Chief Complaint Allergic Reaction  HPI Molly Stone is a 39 y.o. female presents to the emergency department for treatment and evaluation after using prescription "allergy drops" and feeling as if she were having an allergic reaction.  She states that this is a new allergy treatment for her and she has had no difficulty with the previous 6 doses.  She states that her throat feels "tighter than usual and very dry.  She denies feeling short of breath, rash, and denies itching.  She has taken Benadryl and feels as though this has provided significant relief.      Past Medical History:  Diagnosis Date  . Allergy   . Anemia   . Anxiety   . Asthma   . Chlamydia    h/o  . DDD (degenerative disc disease), cervical 04/07/2019  . Disorder of vocal cord    spasmodic dysphonia  . Dysmenorrhea   . Dyspareunia, female   . Endometriosis   . Family history of endometriosis   . GERD (gastroesophageal reflux disease)   . Headache    migraine  . Heavy periods   . History of nephrolithiasis   . Hypertension   . Jaundice, physiologic, newborn   . Obesity (BMI 30-39.9)   . Sleep apnea   . Tobacco user   . Vaginal Pap smear, abnormal     Patient Active Problem List   Diagnosis Date Noted  . Multiple food allergies 02/07/2020  . Vasomotor flushing 02/07/2020  . Neutropenia ([place]) 07/12/2019  . DDD (degenerative disc disease), cervical 04/07/2019  . Hyperlipidemia 04/07/2019  . Abnormal MRI, cervical spine 04/07/2019  . Right calf pain 03/24/2019  . Paroxysmal tachycardia ([place]) 01/28/2019  . Anxiety 01/28/2019  . Chronic bilateral thoracic back pain 03/07/2017  . Spinal stenosis 03/05/2017  . Concussion without loss  of consciousness 08/04/2016  . OSA (obstructive sleep apnea) 07/24/2016  . Generalized anxiety disorder 10/25/2015  . Family history of endometriosis 10/12/2015  . Dysmenorrhea 10/12/2015  . H/O renal calculi 08/15/2015  . Morbid obesity ([place]) 08/15/2015  . Encounter for preventive health examination 06/16/2014  . Sciatica 04/01/2014  . Vitamin D deficiency 04/12/2012  . 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  . GERD (gastroesophageal reflux disease)     Past Surgical History:  Procedure Laterality Date  . DILATATION & CURETTAGE/HYSTEROSCOPY WITH MYOSURE N/A 08/27/2018   Procedure: DILATATION & CURETTAGE/HYSTEROSCOPY WITH MINERVA;  Surgeon: Gae Dry, MD;  Location: [facility] ORS;  Service: Gynecology;  Laterality: N/A;  . DILATION AND CURETTAGE OF UTERUS    . ESOPHAGOGASTRODUODENOSCOPY (EGD) WITH PROPOFOL N/A 08/07/2018   Procedure: ESOPHAGOGASTRODUODENOSCOPY (EGD) WITH PROPOFOL;  Surgeon: Jonathon Bellows, MD;  Location: [facility] ENDOSCOPY;  Service: Gastroenterology;  Laterality: N/A;  . laparoscopy    . ROOT CANAL    . WISDOM TOOTH EXTRACTION      Prior to Admission medications   Medication Sig Start Date End Date Taking? Authorizing Provider  albuterol (PROAIR HFA) 108 (90 Base) MCG/ACT inhaler Inhale 1-2 puffs into the lungs every 6 (six) hours as needed for wheezing or shortness of breath. 12/22/19   Crecencio Mc, MD  Cholecalciferol (VITAMIN D3) 125 MCG (5000 UT) TABS Take by mouth.    [provider]  clindamycin-tretinoin Pershing Proud) gel  07/26/19  [provider]  famotidine (PEPCID) 20 MG tablet Take 20 mg by mouth 2 (two) times daily.    [provider]  fluticasone-salmeterol (ADVAIR HFA) 230-21 MCG/ACT inhaler Inhale 2 puffs into the lungs 2 (two) times daily. 03/22/19   Crecencio Mc, MD  montelukast (SINGULAIR) 10 MG tablet TAKE 1 TABLET AT BEDTIME 11/23/19   Crecencio Mc, MD  nebivolol (BYSTOLIC) 5 MG tablet Take 1 tablet (5 mg total) by mouth daily. 12/22/19    Minna Merritts, MD  pantoprazole (PROTONIX) 40 MG tablet TAKE 1 TABLET TWICE A DAY 06/22/19   Jonathon Bellows, MD  propranolol (INDERAL) 20 MG tablet TAKE 1 TABLET BY MOUTH THREE TIMES DAILY AS NEEDED FOR FAST HEART RATE 02/04/19   Minna Merritts, MD  RHOFADE 1 % CREA  06/29/19   [provider]    Allergies Buprenorphine hcl, Morphine and related, Prednisone, and Tramadol hcl  Family History  Problem Relation Age of Onset  . Endometriosis Mother   . Cancer Father        benign liver CA  . Stroke Father        recurrent blood clots on the brain   . Lupus Father   . Cancer Maternal Grandmother 30       ovarian Ca, still surviving   . Osteoporosis Maternal Grandmother   . Ovarian cancer Maternal Grandmother   . Heart disease Maternal Grandfather   . Cancer Maternal Grandfather 17       colon CA. metastatic  . Diabetes Maternal Uncle   . Heart disease Paternal Grandmother   . CAD Brother 59       early at age 61  . Breast cancer Neg Hx   . Colon cancer Neg Hx   . Kidney cancer Neg Hx   . Bladder Cancer Neg Hx     Social History Social History   Tobacco Use  . Smoking status: Former Smoker    Years: 10.00    Types: Cigarettes    Quit date: 10/2016    Years since quitting: 3.3  . Smokeless tobacco: Never Used  Substance Use Topics  . Alcohol use: Not Currently    Alcohol/week: 0.0 standard drinks  . Drug use: No    Review of Systems  Constitutional: No fever/chills Eyes: No visual changes. ENT: No sore throat. Cardiovascular: Denies chest pain. Respiratory: Denies shortness of breath. Gastrointestinal: No abdominal pain.  No nausea, no vomiting.  No diarrhea.  No constipation. Genitourinary: Negative for dysuria. Musculoskeletal: Negative for back pain. Skin: Negative for rash. Neurological: Negative for headaches, focal weakness or numbness.  ____________________________________________   PHYSICAL EXAM:  VITAL SIGNS: ED Triage Vitals  Enc Vitals  Group     BP 02/26/20 1607 131/73     Pulse Rate 02/26/20 1601 95     Resp 02/26/20 1601 16     Temp 02/26/20 1607 98.7 F (37.1 C)     Temp Source 02/26/20 1607 Oral     SpO2 02/26/20 1601 99 %     Weight 02/26/20 1608 268 lb (121.6 kg)     Height 02/26/20 1608 5\' 9"  (1.753 m)     Head Circumference --      Peak Flow --      Pain Score 02/26/20 1608 0     Pain Loc --      Pain Edu? --      Excl. in [place]? --     Constitutional: Alert and oriented. Well  appearing and in no acute distress. Eyes: Conjunctivae are normal. PERRL. EOMI. Head: Atraumatic. Nose: No congestion/rhinnorhea. Mouth/Throat: Mucous membranes are moist.  Oropharynx non-erythematous. Neck: No stridor.   Hematological/Lymphatic/Immunilogical: No cervical lymphadenopathy. Cardiovascular: Normal rate, regular rhythm. Grossly normal heart sounds.  Good peripheral circulation. Respiratory: Normal respiratory effort.  No retractions. Lungs CTAB. Gastrointestinal: Soft and nontender. No distention. No abdominal bruits. No CVA tenderness. Genitourinary:  Musculoskeletal: No lower extremity tenderness nor edema.  No joint effusions. Neurologic:  Normal speech and language. No gross focal neurologic deficits are appreciated. No gait instability. Skin:  Skin is warm, dry and intact. No rash noted. Psychiatric: Mood and affect are normal. Speech and behavior are normal.  ____________________________________________   LABS (all labs ordered are listed, but only abnormal results are displayed)  Labs Reviewed - No data to display ____________________________________________  EKG  ED ECG REPORT I, Ilean Spradlin, FNP-BC personally viewed and interpreted this ECG.   Date: 03/07/2020  EKG Time: 1617  Rate: 81  Rhythm: normal sinus rhythm  Axis: normal  Intervals:none  ST&T Change: none  ____________________________________________  RADIOLOGY  ED MD interpretation:    Not indicated.  I, Sherrie George,  personally viewed and evaluated these images (plain radiographs) as part of my medical decision making, as well as reviewing the written report by the radiologist.  Official radiology report(s): No results found.  ____________________________________________   PROCEDURES  Procedure(s) performed (including Critical Care):  Procedures  ____________________________________________   INITIAL IMPRESSION / ASSESSMENT AND PLAN     39 year old female presenting to the emergency department for treatment and evaluation of concern for allergic reaction secondary to allergy drops.  See HPI for further details.  While awaiting ER room assignment, patient states that her symptoms have completely resolved.  DIFFERENTIAL DIAGNOSIS  Anaphylaxis, allergic reaction, adverse effect of medication.  ED COURSE  Patient to receive IM Decadron and be discharged home.  She is to follow-up with her allergist or primary care provider for symptoms of concern.  If she feels that she is experiencing an acute allergic reaction, she is to return to the emergency department. ____________________________________________   FINAL CLINICAL IMPRESSION(S) / ED DIAGNOSES  Final diagnoses:  Allergic reaction, initial encounter     ED Discharge Orders    None       Molly Stone was evaluated in Emergency Department on 03/07/2020 for the symptoms described in the history of present illness. She was evaluated in the context of the global COVID-19 pandemic, which necessitated consideration that the patient might be at risk for infection with the SARS-CoV-2 virus that causes COVID-19. Institutional protocols and algorithms that pertain to the evaluation of patients at risk for COVID-19 are in a state of rapid change based on information released by regulatory bodies including the CDC and federal and state organizations. These policies and algorithms were followed during the patient's care in the ED.   Note:   This document was prepared using Dragon voice recognition software and may include unintentional dictation errors.   Victorino Dike, FNP 03/07/20 1501    Harvest Dark, MD 03/08/20 [phone]

## 2020-03-09 ENCOUNTER — Other Ambulatory Visit (INDEPENDENT_AMBULATORY_CARE_PROVIDER_SITE_OTHER): Payer: Commercial Managed Care - PPO

## 2020-03-09 ENCOUNTER — Other Ambulatory Visit: Payer: Self-pay

## 2020-03-09 DIAGNOSIS — D702 Other drug-induced agranulocytosis: Secondary | ICD-10-CM | POA: Diagnosis not present

## 2020-03-09 LAB — SEDIMENTATION RATE: Sed Rate: 30 mm/hr — ABNORMAL HIGH (ref 0–20)

## 2020-03-09 LAB — CBC WITH DIFFERENTIAL/PLATELET
Basophils Absolute: 0 10*3/uL (ref 0.0–0.1)
Basophils Relative: 0.8 % (ref 0.0–3.0)
Eosinophils Absolute: 0.1 10*3/uL (ref 0.0–0.7)
Eosinophils Relative: 3.3 % (ref 0.0–5.0)
HCT: 39.5 % (ref 36.0–46.0)
Hemoglobin: 13.4 g/dL (ref 12.0–15.0)
Lymphocytes Relative: 53.8 % — ABNORMAL HIGH (ref 12.0–46.0)
Lymphs Abs: 1.5 10*3/uL (ref 0.7–4.0)
MCHC: 34 g/dL (ref 30.0–36.0)
MCV: 91 fl (ref 78.0–100.0)
Monocytes Absolute: 0.5 10*3/uL (ref 0.1–1.0)
Monocytes Relative: 18.1 % — ABNORMAL HIGH (ref 3.0–12.0)
Neutro Abs: 0.7 10*3/uL — ABNORMAL LOW (ref 1.4–7.7)
Neutrophils Relative %: 24 % — ABNORMAL LOW (ref 43.0–77.0)
Platelets: 144 10*3/uL — ABNORMAL LOW (ref 150.0–400.0)
RBC: 4.34 Mil/uL (ref 3.87–5.11)
RDW: 12.9 % (ref 11.5–15.5)
WBC: 2.8 10*3/uL — ABNORMAL LOW (ref 4.0–10.5)

## 2020-03-10 ENCOUNTER — Other Ambulatory Visit: Payer: Self-pay | Admitting: Internal Medicine

## 2020-03-10 DIAGNOSIS — D709 Neutropenia, unspecified: Secondary | ICD-10-CM

## 2020-03-10 NOTE — Assessment & Plan Note (Signed)
Persistent,  With relative monocytosis and neutropenia/lymphonpenia and now low platelets.  Additional screening for viral hepatitis,  HIV,  B12, copper,  iron. Folate ,  And hematology referral advised.

## 2020-03-13 ENCOUNTER — Other Ambulatory Visit: Payer: Self-pay

## 2020-03-13 ENCOUNTER — Other Ambulatory Visit (INDEPENDENT_AMBULATORY_CARE_PROVIDER_SITE_OTHER): Payer: Commercial Managed Care - PPO

## 2020-03-13 DIAGNOSIS — D709 Neutropenia, unspecified: Secondary | ICD-10-CM

## 2020-03-14 LAB — VITAMIN B12: Vitamin B-12: 287 pg/mL (ref 211–911)

## 2020-03-16 NOTE — Telephone Encounter (Signed)
Pt called to see if referral had been placed to Dr. Janese Banks

## 2020-03-17 ENCOUNTER — Encounter: Payer: Self-pay | Admitting: Internal Medicine

## 2020-03-17 DIAGNOSIS — E538 Deficiency of other specified B group vitamins: Secondary | ICD-10-CM | POA: Insufficient documentation

## 2020-03-21 LAB — HEPATITIS PANEL, ACUTE
Hep A IgM: NONREACTIVE
Hep B C IgM: NONREACTIVE
Hepatitis B Surface Ag: NONREACTIVE
Hepatitis C Ab: NONREACTIVE
SIGNAL TO CUT-OFF: 0.02 (ref <1.00)

## 2020-03-21 LAB — COPPER, FREE: Copper - Free, Serum/Plasma: NOT DETECTED mcg/L

## 2020-03-21 LAB — HIV ANTIBODY (ROUTINE TESTING W REFLEX): HIV 1&2 Ab, 4th Generation: NONREACTIVE

## 2020-03-21 LAB — IRON,TIBC AND FERRITIN PANEL
%SAT: 27 % (calc) (ref 16–45)
Ferritin: 48 ng/mL (ref 16–154)
Iron: 78 ug/dL (ref 40–190)
TIBC: 291 mcg/dL (calc) (ref 250–450)

## 2020-03-21 LAB — FOLATE RBC: RBC Folate: 582 ng/mL RBC (ref >280)

## 2020-03-21 NOTE — Telephone Encounter (Signed)
  Your referral to Dr Janese Banks will be made   Regards,   Deborra Medina, MD

## 2020-03-27 ENCOUNTER — Encounter: Payer: Self-pay | Admitting: Oncology

## 2020-03-27 ENCOUNTER — Inpatient Hospital Stay: Payer: Commercial Managed Care - PPO | Attending: Oncology | Admitting: Oncology

## 2020-03-27 ENCOUNTER — Other Ambulatory Visit: Payer: Self-pay

## 2020-03-27 ENCOUNTER — Inpatient Hospital Stay: Payer: Commercial Managed Care - PPO

## 2020-03-27 VITALS — BP 122/84 | HR 93 | Temp 97.9°F | Resp 16 | Ht 69.0 in | Wt 278.0 lb

## 2020-03-27 DIAGNOSIS — E785 Hyperlipidemia, unspecified: Secondary | ICD-10-CM | POA: Diagnosis not present

## 2020-03-27 DIAGNOSIS — E538 Deficiency of other specified B group vitamins: Secondary | ICD-10-CM | POA: Insufficient documentation

## 2020-03-27 DIAGNOSIS — K219 Gastro-esophageal reflux disease without esophagitis: Secondary | ICD-10-CM | POA: Insufficient documentation

## 2020-03-27 DIAGNOSIS — I10 Essential (primary) hypertension: Secondary | ICD-10-CM | POA: Diagnosis not present

## 2020-03-27 DIAGNOSIS — D708 Other neutropenia: Secondary | ICD-10-CM

## 2020-03-27 DIAGNOSIS — J45909 Unspecified asthma, uncomplicated: Secondary | ICD-10-CM | POA: Diagnosis not present

## 2020-03-27 DIAGNOSIS — Z79899 Other long term (current) drug therapy: Secondary | ICD-10-CM | POA: Insufficient documentation

## 2020-03-27 DIAGNOSIS — D709 Neutropenia, unspecified: Secondary | ICD-10-CM | POA: Insufficient documentation

## 2020-03-27 DIAGNOSIS — E559 Vitamin D deficiency, unspecified: Secondary | ICD-10-CM | POA: Insufficient documentation

## 2020-03-27 LAB — CBC WITH DIFFERENTIAL/PLATELET
Abs Immature Granulocytes: 0 10*3/uL (ref 0.00–0.07)
Basophils Absolute: 0 10*3/uL (ref 0.0–0.1)
Basophils Relative: 1 %
Eosinophils Absolute: 0.1 10*3/uL (ref 0.0–0.5)
Eosinophils Relative: 4 %
HCT: 41.7 % (ref 36.0–46.0)
Hemoglobin: 14.1 g/dL (ref 12.0–15.0)
Immature Granulocytes: 0 %
Lymphocytes Relative: 52 %
Lymphs Abs: 1.3 10*3/uL (ref 0.7–4.0)
MCH: 30.4 pg (ref 26.0–34.0)
MCHC: 33.8 g/dL (ref 30.0–36.0)
MCV: 89.9 fL (ref 80.0–100.0)
Monocytes Absolute: 0.5 10*3/uL (ref 0.1–1.0)
Monocytes Relative: 22 %
Neutro Abs: 0.5 10*3/uL — ABNORMAL LOW (ref 1.7–7.7)
Neutrophils Relative %: 21 %
Platelets: 191 10*3/uL (ref 150–400)
RBC: 4.64 MIL/uL (ref 3.87–5.11)
RDW: 12.3 % (ref 11.5–15.5)
WBC: 2.4 10*3/uL — ABNORMAL LOW (ref 4.0–10.5)
nRBC: 0 % (ref 0.0–0.2)

## 2020-03-27 LAB — PATHOLOGIST SMEAR REVIEW

## 2020-03-27 NOTE — Progress Notes (Signed)
Pt does not have fevers most of her life- she said maybe few years ago 99.5 temp. She has hot flashes mostly at night. She still has heavy periods. She has unexplained pain at times in her legs mostly at night. She has been having sweats lately and it started after she was covid vaccination. No infections lately. Last kidney stone was 2 years ago

## 2020-03-28 LAB — ANA COMPREHENSIVE PANEL
Anti JO-1: <0.2 AI (ref 0.0–0.9)
Centromere Ab Screen: <0.2 AI (ref 0.0–0.9)
Chromatin Ab SerPl-aCnc: <0.2 AI (ref 0.0–0.9)
ENA SM Ab Ser-aCnc: <0.2 AI (ref 0.0–0.9)
Ribonucleic Protein: 0.2 AI (ref 0.0–0.9)
SSA (Ro) (ENA) Antibody, IgG: <0.2 AI (ref 0.0–0.9)
SSB (La) (ENA) Antibody, IgG: <0.2 AI (ref 0.0–0.9)
Scleroderma (Scl-70) (ENA) Antibody, IgG: <0.2 AI (ref 0.0–0.9)
ds DNA Ab: 6 IU/mL (ref 0–9)

## 2020-03-29 NOTE — Progress Notes (Signed)
Hematology/Oncology Consult note [facility] Telephone:([phone] [phone]  Patient Care Team: Crecencio Mc, MD as PCP - General (Internal Medicine)   Name of the patient: Molly Stone  [id number]  [birth year]    Reason for referral- neutropenia   Referring physician- Dr. Derrel Nip  Date of visit: 03/29/20   History of presenting illness-patient is a 39 year old female with a past medical history significant for spinal stenosis, obstructive sleep apnea referred for leukopenia/neutropenia.She has been feeling poorly for the last 1 month and reports having significant fatigue.  Reports occasional chest pain which lasts for a few minutes and then resolves on its own.  Denies any fever.  Appetite and weight have remained stable.  Denies any history of autoimmune disorders.  Looking back at her prior CBCs. Patient has had a normal white cell count with a normal differential up until 2019 when she was noted to have leukopenia for the first time in November 2019.  On 09/11/2018 her white count was 3.1 with an [place] of 1.  Following that her white cell count normalized but was again found to be low in June 2020 when her white count was 3.2 with an ANC of 0.9.  ANC improved to 1.3 in September 2020 but since then her [place] has been trending down to 800 in April 2021 and 700 on 03/09/2020.  Hemoglobin is always remained stable between 13-14 and platelets have been normal.  She had some initial work-up done by Dr. Derrel Nip and was found to have borderline low B12 levels of 287.  Copper levels and folate was normal.  Iron studies were within normal limits.  She also had HIV testing and hep B and hep C testing which was all negative.  ECOG PS- 1  Pain scale- 0   Review of systems- Review of Systems  Constitutional: Positive for malaise/fatigue. Negative for chills, fever and weight loss.  HENT: Negative for congestion, ear discharge and nosebleeds.   Eyes: Negative for  blurred vision.  Respiratory: Negative for cough, hemoptysis, sputum production, shortness of breath and wheezing.   Cardiovascular: Negative for chest pain, palpitations, orthopnea and claudication.  Gastrointestinal: Negative for abdominal pain, blood in stool, constipation, diarrhea, heartburn, melena, nausea and vomiting.  Genitourinary: Negative for dysuria, flank pain, frequency, hematuria and urgency.  Musculoskeletal: Negative for back pain, joint pain and myalgias.  Skin: Negative for rash.  Neurological: Negative for dizziness, tingling, focal weakness, seizures, weakness and headaches.  Endo/Heme/Allergies: Does not bruise/bleed easily.  Psychiatric/Behavioral: Negative for depression and suicidal ideas. The patient does not have insomnia.     Allergies  Allergen Reactions  . Buprenorphine Hcl Itching and Other (See Comments)    And shaking  . Morphine And Related Itching and Other (See Comments)    And shaking  . Prednisone Anxiety and Palpitations  . Tramadol Hcl Other (See Comments)    anxious    Patient Active Problem List   Diagnosis Date Noted  . B12 deficiency 03/17/2020  . Multiple food allergies 02/07/2020  . Vasomotor flushing 02/07/2020  . Neutropenia ([place]) 07/12/2019  . DDD (degenerative disc disease), cervical 04/07/2019  . Hyperlipidemia 04/07/2019  . Abnormal MRI, cervical spine 04/07/2019  . Right calf pain 03/24/2019  . Paroxysmal tachycardia ([place]) 01/28/2019  . Anxiety 01/28/2019  . Chronic bilateral thoracic back pain 03/07/2017  . Spinal stenosis 03/05/2017  . Concussion without loss of consciousness 08/04/2016  . OSA (obstructive sleep apnea) 07/24/2016  . Generalized anxiety disorder 10/25/2015  .  Family history of endometriosis 10/12/2015  . Dysmenorrhea 10/12/2015  . H/O renal calculi 08/15/2015  . Morbid obesity ([place]) 08/15/2015  . Encounter for preventive health examination 06/16/2014  . Sciatica 04/01/2014  . Vitamin D deficiency  04/12/2012  . 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  . GERD (gastroesophageal reflux disease)      Past Medical History:  Diagnosis Date  . Allergy   . Anemia   . Anxiety   . Asthma   . Chlamydia    h/o  . DDD (degenerative disc disease), cervical 04/07/2019  . Disorder of vocal cord    spasmodic dysphonia  . Dysmenorrhea   . Dyspareunia, female   . Endometriosis   . Family history of endometriosis   . GERD (gastroesophageal reflux disease)   . Headache    migraine  . Heavy periods   . Hiatal hernia   . History of nephrolithiasis   . Hypertension   . Jaundice, physiologic, newborn   . Obesity (BMI 30-39.9)   . Sleep apnea   . Tobacco user   . Vaginal Pap smear, abnormal      Past Surgical History:  Procedure Laterality Date  . DILATATION & CURETTAGE/HYSTEROSCOPY WITH MYOSURE N/A 08/27/2018   Procedure: DILATATION & CURETTAGE/HYSTEROSCOPY WITH MINERVA;  Surgeon: Gae Dry, MD;  Location: [facility] ORS;  Service: Gynecology;  Laterality: N/A;  . DILATION AND CURETTAGE OF UTERUS    . ESOPHAGOGASTRODUODENOSCOPY (EGD) WITH PROPOFOL N/A 08/07/2018   Procedure: ESOPHAGOGASTRODUODENOSCOPY (EGD) WITH PROPOFOL;  Surgeon: Jonathon Bellows, MD;  Location: [facility] ENDOSCOPY;  Service: Gastroenterology;  Laterality: N/A;  . laparoscopy    . ROOT CANAL    . WISDOM TOOTH EXTRACTION      Social History   Socioeconomic History  . Marital status: Married    Spouse name: Not on file  . Number of children: Not on file  . Years of education: Not on file  . Highest education level: Not on file  Occupational History  . Not on file  Tobacco Use  . Smoking status: Former Smoker    Years: 10.00    Types: Cigarettes    Quit date: 10/2016    Years since quitting: 3.4  . Smokeless tobacco: Never Used  Substance and Sexual Activity  . Alcohol use: Not Currently    Alcohol/week: 0.0 standard drinks  . Drug use: No  . Sexual activity: Yes    Birth control/protection: None    Comment: vasectomy    Other Topics Concern  . Not on file  Social History Narrative  . Not on file   Social Determinants of Health   Financial Resource Strain:   . Difficulty of Paying Living Expenses:   Food Insecurity:   . Worried About Charity fundraiser in the Last Year:   . Arboriculturist in the Last Year:   Transportation Needs:   . Film/video editor (Medical):   Marland Kitchen Lack of Transportation (Non-Medical):   Physical Activity:   . Days of Exercise per Week:   . Minutes of Exercise per Session:   Stress:   . Feeling of Stress :   Social Connections:   . Frequency of Communication with Friends and Family:   . Frequency of Social Gatherings with Friends and Family:   . Attends Religious Services:   . Active Member of Clubs or Organizations:   . Attends Archivist Meetings:   Marland Kitchen Marital Status:   Intimate Partner Violence:   . Fear of Current or Ex-Partner:   .  Emotionally Abused:   Marland Kitchen Physically Abused:   . Sexually Abused:      Family History  Problem Relation Age of Onset  . Endometriosis Mother   . Cancer Father        benign liver CA  . Stroke Father        recurrent blood clots on the brain   . Lupus Father   . Cancer Maternal Grandmother 30       ovarian Ca, still surviving   . Osteoporosis Maternal Grandmother   . Ovarian cancer Maternal Grandmother   . Heart disease Maternal Grandfather   . Cancer Maternal Grandfather 68       colon CA. metastatic  . Diabetes Maternal Uncle   . Heart disease Paternal Grandmother   . CAD Brother 69       early at age 43  . Breast cancer Neg Hx   . Colon cancer Neg Hx   . Kidney cancer Neg Hx   . Bladder Cancer Neg Hx      Current Outpatient Medications:  .  albuterol (PROAIR HFA) 108 (90 Base) MCG/ACT inhaler, Inhale 1-2 puffs into the lungs every 6 (six) hours as needed for wheezing or shortness of breath., Disp: 8 g, Rfl: 2 .  Cholecalciferol (VITAMIN D3) 125 MCG (5000 UT) TABS, Take 1 tablet by mouth daily. , Disp: ,  Rfl:  .  famotidine (PEPCID) 20 MG tablet, Take 20 mg by mouth 2 (two) times daily., Disp: , Rfl:  .  fluticasone-salmeterol (ADVAIR HFA) 230-21 MCG/ACT inhaler, Inhale 2 puffs into the lungs 2 (two) times daily., Disp: 36 g, Rfl: 12 .  montelukast (SINGULAIR) 10 MG tablet, TAKE 1 TABLET AT BEDTIME, Disp: 90 tablet, Rfl: 3 .  Multiple Vitamin (MULTIVITAMIN) tablet, Take 1 tablet by mouth daily., Disp: , Rfl:  .  nebivolol (BYSTOLIC) 5 MG tablet, Take 1 tablet (5 mg total) by mouth daily., Disp: 90 tablet, Rfl: 3 .  pantoprazole (PROTONIX) 40 MG tablet, TAKE 1 TABLET TWICE A DAY, Disp: 180 tablet, Rfl: 3 .  vitamin B-12 (CYANOCOBALAMIN) 1000 MCG tablet, Take 1,000 mcg by mouth daily., Disp: , Rfl:  .  propranolol (INDERAL) 20 MG tablet, TAKE 1 TABLET BY MOUTH THREE TIMES DAILY AS NEEDED FOR FAST HEART RATE (Patient not taking: Reported on 03/27/2020), Disp: 90 tablet, Rfl: 0   Physical exam:  Vitals:   03/27/20 1332  BP: 122/84  Pulse: 93  Resp: 16  Temp: 97.9 F (36.6 C)  TempSrc: Tympanic  Weight: 278 lb (126.1 kg)  Height: 5' 9" (1.753 m)   Physical Exam Constitutional:      General: She is not in acute distress. HENT:     Head: Normocephalic and atraumatic.  Eyes:     Pupils: Pupils are equal, round, and reactive to light.  Cardiovascular:     Rate and Rhythm: Normal rate and regular rhythm.     Heart sounds: Normal heart sounds.  Pulmonary:     Effort: Pulmonary effort is normal.     Breath sounds: Normal breath sounds.  Abdominal:     General: Bowel sounds are normal.     Palpations: Abdomen is soft.     Comments: No palpable hepatosplenomegaly  Musculoskeletal:     Cervical back: Normal range of motion.  Lymphadenopathy:     Comments: No palpable cervical, supraclavicular, axillary or inguinal adenopathy   Skin:    General: Skin is warm and dry.  Neurological:     Mental  Status: She is alert and oriented to person, place, and time.        CMP Latest Ref Rng &  Units 02/07/2020  Glucose 70 - 99 mg/dL 94  BUN 6 - 23 mg/dL 13  Creatinine 0.40 - 1.20 mg/dL 0.80  Sodium 135 - 145 mEq/L 135  Potassium 3.5 - 5.1 mEq/L 4.0  Chloride 96 - 112 mEq/L 101  CO2 19 - 32 mEq/L 28  Calcium 8.4 - 10.5 mg/dL 8.9  Total Protein 6.0 - 8.3 g/dL 7.4  Total Bilirubin 0.2 - 1.2 mg/dL 0.6  Alkaline Phos 39 - 117 U/L 57  AST 0 - 37 U/L 17  ALT 0 - 35 U/L 16   CBC Latest Ref Rng & Units 03/27/2020  WBC 4.0 - 10.5 K/uL 2.4(L)  Hemoglobin 12.0 - 15.0 g/dL 14.1  Hematocrit 36.0 - 46.0 % 41.7  Platelets 150 - 400 K/uL 191     Assessment and plan- Patient is a 39 y.o. female referred for neutropenia  Patient has had mild waxing and waning neutropenia in the past.  However since June 2020 there has been a steady decrease in her neutrophil count from 1300 to 800 to 700 on her most recent labs.  B12 levels are mildly low at 287 and patient is currently taking oral B12.  HIV and hepatitis testing was negative.  Today I will check a CBC with differential, flow cytometry and a vasculitis panel as well as pathology smear review.  Video visit with patient in 2 weeks time.  If patient has persistent neutropenia of unclear etiology I will consider getting a bone marrow biopsy   Thank you for this kind referral and the opportunity to participate in the care of this patient   Visit Diagnosis 1. Other neutropenia ([place])     Dr. Randa Evens, MD, MPH [facility] [phone] 03/29/2020 3:45 PM

## 2020-03-30 ENCOUNTER — Encounter: Payer: Self-pay | Admitting: Internal Medicine

## 2020-03-30 ENCOUNTER — Telehealth (INDEPENDENT_AMBULATORY_CARE_PROVIDER_SITE_OTHER): Payer: Commercial Managed Care - PPO | Admitting: Internal Medicine

## 2020-03-30 VITALS — BP 122/86 | Ht 69.02 in | Wt 269.0 lb

## 2020-03-30 DIAGNOSIS — E538 Deficiency of other specified B group vitamins: Secondary | ICD-10-CM | POA: Diagnosis not present

## 2020-03-30 DIAGNOSIS — R0789 Other chest pain: Secondary | ICD-10-CM

## 2020-03-30 DIAGNOSIS — D708 Other neutropenia: Secondary | ICD-10-CM | POA: Diagnosis not present

## 2020-03-30 DIAGNOSIS — H6692 Otitis media, unspecified, left ear: Secondary | ICD-10-CM | POA: Diagnosis not present

## 2020-03-30 LAB — COMP PANEL: LEUKEMIA/LYMPHOMA

## 2020-03-30 MED ORDER — AMOXICILLIN-POT CLAVULANATE 875-125 MG PO TABS: 1.0000 | ORAL_TABLET | Freq: Two times a day (BID) | ORAL | 0 refills | Status: DC

## 2020-03-30 NOTE — Assessment & Plan Note (Signed)
History of description of pain suggests MSK origin . Suggest seeing a chiropractor . Reassurance provided

## 2020-03-30 NOTE — Assessment & Plan Note (Addendum)
Treating with augmentin given neutropenia.  Continue antihistamines

## 2020-03-30 NOTE — Progress Notes (Signed)
Virtual Visit via caregility  This visit type was conducted due to national recommendations for restrictions regarding the COVID-19 pandemic (e.g. social distancing).  This format is felt to be most appropriate for this patient at this time.  All issues noted in this document were discussed and addressed.  No physical exam was performed (except for noted visual exam findings with Video Visits).   I connected with@ on 03/30/20 at 11:30 AM EDT by a video enabled telemedicine application and verified that I am speaking with the correct person using two identifiers. Location patient: home Location provider: work or home office Persons participating in the virtual visit: patient, provider  I discussed the limitations, risks, security and privacy concerns of performing an evaluation and management service by telephone and the availability of in person appointments. I also discussed with the patient that there may be a patient responsible charge related to this service. The patient expressed understanding and agreed to proceed. Reason for visit: multiple  HPI:  39 yr old female with neutropenia of uncertain origin (borderline low b12) presents for evaluation of intermittentepisodic SSCP for the past month. Chest pain occurs at rest,,  Started after beginning a rigorous exercise program which included using a kettle ball (weighted) for lifting during squats.  The pain is in center of chest and becomes quite intense and resolves when she twists her upper body and feels a "POP".    2) New onset left ear pain  Moderate to severe,  With Left cervical LAD and sore throat. Started this morning .  Allergic rhinitis symptoms were high yesterday and she has felt cogested on the  Right side.  Has not left the house  In weeks (husband does shopping)   ROS: See pertinent positives and negatives per HPI.  Past Medical History:  Diagnosis Date   Allergy    Anemia    Anxiety    Asthma    Chlamydia    h/o    DDD (degenerative disc disease), cervical 04/07/2019   Disorder of vocal cord    spasmodic dysphonia   Dysmenorrhea    Dyspareunia, female    Endometriosis    Family history of endometriosis    GERD (gastroesophageal reflux disease)    Headache    migraine   Heavy periods    Hiatal hernia    History of nephrolithiasis    Hypertension    Jaundice, physiologic, newborn    Obesity (BMI 30-39.9)    Sleep apnea    Tobacco user    Vaginal Pap smear, abnormal     Past Surgical History:  Procedure Laterality Date   DILATATION & CURETTAGE/HYSTEROSCOPY WITH MYOSURE N/A 08/27/2018   Procedure: DILATATION & CURETTAGE/HYSTEROSCOPY WITH MINERVA;  Surgeon: Gae Dry, MD;  Location: [facility] ORS;  Service: Gynecology;  Laterality: N/A;   DILATION AND CURETTAGE OF UTERUS     ESOPHAGOGASTRODUODENOSCOPY (EGD) WITH PROPOFOL N/A 08/07/2018   Procedure: ESOPHAGOGASTRODUODENOSCOPY (EGD) WITH PROPOFOL;  Surgeon: Jonathon Bellows, MD;  Location: [facility] ENDOSCOPY;  Service: Gastroenterology;  Laterality: N/A;   laparoscopy     ROOT CANAL     WISDOM TOOTH EXTRACTION      Family History  Problem Relation Age of Onset   Endometriosis Mother    Cancer Father        benign liver CA   Stroke Father        recurrent blood clots on the brain    Lupus Father    Cancer Maternal Grandmother 81  ovarian Ca, still surviving    Osteoporosis Maternal Grandmother    Ovarian cancer Maternal Grandmother    Heart disease Maternal Grandfather    Cancer Maternal Grandfather 71       colon CA. metastatic   Diabetes Maternal Uncle    Heart disease Paternal Grandmother    CAD Brother 12       early at age 24   Breast cancer Neg Hx    Colon cancer Neg Hx    Kidney cancer Neg Hx    Bladder Cancer Neg Hx     SOCIAL HX:  reports that she quit smoking about 3 years ago. Her smoking use included cigarettes. She quit after 10.00 years of use. She has never used smokeless  tobacco. She reports previous alcohol use. She reports that she does not use drugs.   Current Outpatient Medications:    albuterol (PROAIR HFA) 108 (90 Base) MCG/ACT inhaler, Inhale 1-2 puffs into the lungs every 6 (six) hours as needed for wheezing or shortness of breath., Disp: 8 g, Rfl: 2   Cholecalciferol (VITAMIN D3) 125 MCG (5000 UT) TABS, Take 1 tablet by mouth daily. , Disp: , Rfl:    famotidine (PEPCID) 20 MG tablet, Take 20 mg by mouth 2 (two) times daily., Disp: , Rfl:    fluticasone-salmeterol (ADVAIR HFA) 230-21 MCG/ACT inhaler, Inhale 2 puffs into the lungs 2 (two) times daily., Disp: 36 g, Rfl: 12   montelukast (SINGULAIR) 10 MG tablet, TAKE 1 TABLET AT BEDTIME, Disp: 90 tablet, Rfl: 3   Multiple Vitamin (MULTIVITAMIN) tablet, Take 1 tablet by mouth daily., Disp: , Rfl:    nebivolol (BYSTOLIC) 5 MG tablet, Take 1 tablet (5 mg total) by mouth daily., Disp: 90 tablet, Rfl: 3   pantoprazole (PROTONIX) 40 MG tablet, TAKE 1 TABLET TWICE A DAY, Disp: 180 tablet, Rfl: 3   vitamin B-12 (CYANOCOBALAMIN) 1000 MCG tablet, Take 1,000 mcg by mouth daily., Disp: , Rfl:    amoxicillin-clavulanate (AUGMENTIN) 875-125 MG tablet, Take 1 tablet by mouth 2 (two) times daily., Disp: 14 tablet, Rfl: 0   EPINEPHrine 0.3 mg/0.3 mL IJ SOAJ injection, See admin instructions. for allergic reaction, Disp: , Rfl:   EXAM:  VITALS per patient if applicable:  GENERAL: alert, oriented, appears well and in no acute distress  HEENT: atraumatic, conjunttiva clear, no obvious abnormalities on inspection of external nose and ears  NECK: normal movements of the head and neck  LUNGS: on inspection no signs of respiratory distress, breathing rate appears normal, no obvious gross SOB, gasping or wheezing  CV: no obvious cyanosis  MS: moves all visible extremities without noticeable abnormality  PSYCH/NEURO: pleasant and cooperative, no obvious depression or anxiety, speech and thought processing  grossly intact  ASSESSMENT AND PLAN:  Discussed the following assessment and plan:  B12 deficiency - Plan: Intrinsic Factor Antibodies, Methylmalonic Acid  Non-cardiac chest pain  Left otitis media, unspecified otitis media type  Other neutropenia (HCC)  Non-cardiac chest pain History of description of pain suggests MSK origin . Suggest seeing a chiropractor . Reassurance provided   Otitis media, left Treating with augmentin given neutropenia.  Continue antihistamines   Neutropenia (HCC) Etiology unclear,  Workup in progress.  HIV negative,  ANA equivocal.  Treating low B12,  MMA and IF ab ordered.  Heme onc working up,  BM biopsy planned.     I discussed the assessment and treatment plan with the patient. The patient was provided an opportunity to ask questions and all  were answered. The patient agreed with the plan and demonstrated an understanding of the instructions.   The patient was advised to call back or seek an in-person evaluation if the symptoms worsen or if the condition fails to improve as anticipated.   I provided  30 minutes of non-face-to-face time during this encounter reviewing patient's current problems and past surgeries, labs and imaging studies, providing counseling on the above mentioned problems , and coordination  of care . Crecencio Mc, MD

## 2020-03-30 NOTE — Assessment & Plan Note (Signed)
Etiology unclear,  Workup in progress.  HIV negative,  ANA equivocal.  Treating low B12,  MMA and IF ab ordered.  Heme onc working up,  BM biopsy planned.

## 2020-03-31 ENCOUNTER — Other Ambulatory Visit (INDEPENDENT_AMBULATORY_CARE_PROVIDER_SITE_OTHER): Payer: Commercial Managed Care - PPO

## 2020-03-31 ENCOUNTER — Other Ambulatory Visit: Payer: Self-pay

## 2020-03-31 DIAGNOSIS — E538 Deficiency of other specified B group vitamins: Secondary | ICD-10-CM

## 2020-04-04 LAB — METHYLMALONIC ACID, SERUM: Methylmalonic Acid, Quant: 133 nmol/L (ref 87–318)

## 2020-04-04 LAB — INTRINSIC FACTOR ANTIBODIES: Intrinsic Factor: NEGATIVE

## 2020-04-06 ENCOUNTER — Encounter: Payer: Self-pay | Admitting: Oncology

## 2020-04-06 ENCOUNTER — Inpatient Hospital Stay (HOSPITAL_BASED_OUTPATIENT_CLINIC_OR_DEPARTMENT_OTHER): Payer: Commercial Managed Care - PPO | Admitting: Oncology

## 2020-04-06 DIAGNOSIS — D708 Other neutropenia: Secondary | ICD-10-CM

## 2020-04-07 ENCOUNTER — Telehealth: Payer: Self-pay | Admitting: *Deleted

## 2020-04-07 NOTE — Progress Notes (Signed)
Patient called and given pre procedure instructions for BMB 04/13/2020, made aware to be here @ 0730, NPO after Mn prior to procedure., and driver for after discharge. Stated understanding.

## 2020-04-07 NOTE — Telephone Encounter (Signed)
Called patient to let her know that we will be doing the bone marrow biopsy on June 24 in the medical mall at [facility] she should arrive at 730 for an 830 procedure.  She will need to be n.p.o. after midnight on Wednesday at bedtime.  On Thursday if she needs a medicine to take she should take it with a drink of water.  Patient has to have a driver because she will be getting conscious sedation medicine and she should not drive or make decisions for 24 hours from getting a med.  Her follow-up visit will be on July 1 at 1015.  Agreeable to both appointments and will call back if she has any issues

## 2020-04-10 NOTE — Progress Notes (Signed)
I connected with Molly Stone on 04/10/20 at  2:45 PM EDT by video enabled telemedicine visit and verified that I am speaking with the correct person using two identifiers.   I discussed the limitations, risks, security and privacy concerns of performing an evaluation and management service by telemedicine and the availability of in-person appointments. I also discussed with the patient that there may be a patient responsible charge related to this service. The patient expressed understanding and agreed to proceed.  Other persons participating in the visit and their role in the encounter:  none  Patient's location:  home Provider's location:  work  Risk analyst Complaint: Discuss results of blood work  History of present illness: patient is a 39 year old female with a past medical history significant for spinal stenosis, obstructive sleep apnea referred for leukopenia/neutropenia.She has been feeling poorly for the last 1 month and reports having significant fatigue.  Reports occasional chest pain which lasts for a few minutes and then resolves on its own.  Denies any fever.  Appetite and weight have remained stable.  Denies any history of autoimmune disorders.  Looking back at her prior CBCs. Patient has had a normal white cell count with a normal differential up until 2019 when she was noted to have leukopenia for the first time in November 2019.  On 09/11/2018 her white count was 3.1 with an [place] of 1.  Following that her white cell count normalized but was again found to be low in June 2020 when her white count was 3.2 with an ANC of 0.9.  ANC improved to 1.3 in September 2020 but since then her [place] has been trending down to 800 in April 2021 and 700 on 03/09/2020.  Hemoglobin is always remained stable between 13-14 and platelets have been normal.  She had some initial work-up done by Dr. Derrel Nip and was found to have borderline low B12 levels of 287.  Copper levels and folate was normal.  Iron studies were  within normal limits.  She also had HIV testing and hep B and hep C testing which was all negative.  Results of blood work from 03/27/2020 were as follows: CBC showed white count of 2.4, H&H of 14.1/41.7 and a platelet count of 191.  ANC was low at 500.  Flow cytometry was unable to evaluate B-cell for allergy due to nonspecific light chain binding.  ANA comprehensive panel was negative.  Pathology smear review showed relative neutropenia.  Normal erythrocytes and normal platelets.  Interval history: Patient continues to feel poorly and complains of significant fatigue   Review of Systems  Constitutional: Positive for malaise/fatigue. Negative for chills, fever and weight loss.  HENT: Negative for congestion, ear discharge and nosebleeds.   Eyes: Negative for blurred vision.  Respiratory: Negative for cough, hemoptysis, sputum production, shortness of breath and wheezing.   Cardiovascular: Negative for chest pain, palpitations, orthopnea and claudication.  Gastrointestinal: Negative for abdominal pain, blood in stool, constipation, diarrhea, heartburn, melena, nausea and vomiting.  Genitourinary: Negative for dysuria, flank pain, frequency, hematuria and urgency.  Musculoskeletal: Negative for back pain, joint pain and myalgias.  Skin: Negative for rash.  Neurological: Negative for dizziness, tingling, focal weakness, seizures, weakness and headaches.  Endo/Heme/Allergies: Does not bruise/bleed easily.  Psychiatric/Behavioral: Negative for depression and suicidal ideas. The patient does not have insomnia.     Allergies  Allergen Reactions   Buprenorphine Hcl Itching and Other (See Comments)    And shaking   Morphine And Related Itching and Other (See Comments)  And shaking   Prednisone Anxiety and Palpitations   Tramadol Hcl Other (See Comments)    anxious    Past Medical History:  Diagnosis Date   Allergy    Anemia    Anxiety    Asthma    Chlamydia    h/o   DDD  (degenerative disc disease), cervical 04/07/2019   Disorder of vocal cord    spasmodic dysphonia   Dysmenorrhea    Dyspareunia, female    Endometriosis    Family history of endometriosis    GERD (gastroesophageal reflux disease)    Headache    migraine   Heavy periods    Hiatal hernia    History of nephrolithiasis    Hypertension    Jaundice, physiologic, newborn    Obesity (BMI 30-39.9)    Sleep apnea    Tobacco user    Vaginal Pap smear, abnormal     Past Surgical History:  Procedure Laterality Date   DILATATION & CURETTAGE/HYSTEROSCOPY WITH MYOSURE N/A 08/27/2018   Procedure: DILATATION & CURETTAGE/HYSTEROSCOPY WITH MINERVA;  Surgeon: Gae Dry, MD;  Location: [facility] ORS;  Service: Gynecology;  Laterality: N/A;   DILATION AND CURETTAGE OF UTERUS     ESOPHAGOGASTRODUODENOSCOPY (EGD) WITH PROPOFOL N/A 08/07/2018   Procedure: ESOPHAGOGASTRODUODENOSCOPY (EGD) WITH PROPOFOL;  Surgeon: Jonathon Bellows, MD;  Location[facility] END[facility]COPY;  Service: Gastroenterology;  Laterality: N/A;   laparoscopy     ROOT CANAL     WISDOM TOOTH EXTRACTION      Social History   Socioeconomic History   Marital status: Married    Spouse name: Not on file   Number of children: Not on file   Years of education: Not on file   Highest education level: Not on file  Occupational History   Not on file  Tobacco Use   Smoking status: Former Smoker    Years: 10.00    Types: Cigarettes    Quit date: 10/2016    Years since quitting: 3.4   Smokeless tobacco: Never Used  Scientific laboratory technician Use: Former  Substance and Sexual Activity   Alcohol use: Not Currently    Alcohol/week: 0.0 standard drinks   Drug use: No   Sexual activity: Yes    Birth control/protection: None    Comment: vasectomy  Other Topics Concern   Not on file  Social History Narrative   Not on file   Social Determinants of Health   Financial Resource Strain:    Difficulty of Paying Living  Expenses:   Food Insecurity:    Worried About Charity fundraiser in the Last Year:    Arboriculturist in the Last Year:   Transportation Needs:    Film/video editor (Medical):    Lack of Transportation (Non-Medical):   Physical Activity:    Days of Exercise per Week:    Minutes of Exercise per Session:   Stress:    Feeling of Stress :   Social Connections:    Frequency of Communication with Friends and Family:    Frequency of Social Gatherings with Friends and Family:    Attends Religious Services:    Active Member of Clubs or Organizations:    Attends Music therapist:    Marital Status:   Intimate Partner Violence:    Fear of Current or Ex-Partner:    Emotionally Abused:    Physically Abused:    Sexually Abused:     Family History  Problem Relation Age of Onset  Endometriosis Mother    Cancer Father        benign liver CA   Stroke Father        recurrent blood clots on the brain    Lupus Father    Cancer Maternal Grandmother 28       ovarian Ca, still surviving    Osteoporosis Maternal Grandmother    Ovarian cancer Maternal Grandmother    Heart disease Maternal Grandfather    Cancer Maternal Grandfather 71       colon CA. metastatic   Diabetes Maternal Uncle    Heart disease Paternal Grandmother    CAD Brother 66       early at age 72   Breast cancer Neg Hx    Colon cancer Neg Hx    Kidney cancer Neg Hx    Bladder Cancer Neg Hx      Current Outpatient Medications:    albuterol (PROAIR HFA) 108 (90 Base) MCG/ACT inhaler, Inhale 1-2 puffs into the lungs every 6 (six) hours as needed for wheezing or shortness of breath., Disp: 8 g, Rfl: 2   amoxicillin-clavulanate (AUGMENTIN) 875-125 MG tablet, Take 1 tablet by mouth 2 (two) times daily., Disp: 14 tablet, Rfl: 0   Cholecalciferol (VITAMIN D3) 125 MCG (5000 UT) TABS, Take 1 tablet by mouth daily. , Disp: , Rfl:    EPINEPHrine 0.3 mg/0.3 mL IJ SOAJ  injection, See admin instructions. for allergic reaction, Disp: , Rfl:    famotidine (PEPCID) 20 MG tablet, Take 20 mg by mouth 2 (two) times daily., Disp: , Rfl:    fluticasone-salmeterol (ADVAIR HFA) 230-21 MCG/ACT inhaler, Inhale 2 puffs into the lungs 2 (two) times daily., Disp: 36 g, Rfl: 12   montelukast (SINGULAIR) 10 MG tablet, TAKE 1 TABLET AT BEDTIME, Disp: 90 tablet, Rfl: 3   Multiple Vitamin (MULTIVITAMIN) tablet, Take 1 tablet by mouth daily., Disp: , Rfl:    nebivolol (BYSTOLIC) 5 MG tablet, Take 1 tablet (5 mg total) by mouth daily., Disp: 90 tablet, Rfl: 3   pantoprazole (PROTONIX) 40 MG tablet, TAKE 1 TABLET TWICE A DAY, Disp: 180 tablet, Rfl: 3   vitamin B-12 (CYANOCOBALAMIN) 1000 MCG tablet, Take 1,000 mcg by mouth daily., Disp: , Rfl:   No results found.  No images are attached to the encounter.   CMP Latest Ref Rng & Units 02/07/2020  Glucose 70 - 99 mg/dL 94  BUN 6 - 23 mg/dL 13  Creatinine 0.40 - 1.20 mg/dL 0.80  Sodium 135 - 145 mEq/L 135  Potassium 3.5 - 5.1 mEq/L 4.0  Chloride 96 - 112 mEq/L 101  CO2 19 - 32 mEq/L 28  Calcium 8.4 - 10.5 mg/dL 8.9  Total Protein 6.0 - 8.3 g/dL 7.4  Total Bilirubin 0.2 - 1.2 mg/dL 0.6  Alkaline Phos 39 - 117 U/L 57  AST 0 - 37 U/L 17  ALT 0 - 35 U/L 16   CBC Latest Ref Rng & Units 03/27/2020  WBC 4.0 - 10.5 K/uL 2.4(L)  Hemoglobin 12.0 - 15.0 g/dL 14.1  Hematocrit 36 - 46 % 41.7  Platelets 150 - 400 K/uL 191     Observation/objective: Appears in no acute distress over video visit today.  Breathing is nonlabored  Assessment and plan: Patient is a 39 year old female referred for neutropenia  Patient has isolated neutropenia of unclear etiology.  Her B12 levels are borderline low at 287 but not significantly low.  HIV and hepatitis testing was negative.  Vasculitis panel is negative.Patient has  had waxing and waning neutropenia in the past but her neutrophil counts have been steadily declining since September 2020.   But then her [place] was 1.3 which went down to 0.8 and now down to 0.5.  I would therefore like to proceed with a bone marrow biopsy at this time to rule out any primary marrow process.  If bone marrow biopsy is negative and does not reveal any acute pathology we may be dealing with a viral versus autoimmune cause of neutropenia  Follow-up instructions: I will see the patient back after bone marrow results are back  I discussed the assessment and treatment plan with the patient. The patient was provided an opportunity to ask questions and all were answered. The patient agreed with the plan and demonstrated an understanding of the instructions.   The patient was advised to call back or seek an in-person evaluation if the symptoms worsen or if the condition fails to improve as anticipated.    Visit Diagnosis: 1. Other neutropenia ([place])     Dr. Randa Evens, MD, MPH [facility] Tel- [phone] 04/10/2020 4:32 PM

## 2020-04-12 ENCOUNTER — Telehealth: Payer: Self-pay | Admitting: *Deleted

## 2020-04-12 ENCOUNTER — Other Ambulatory Visit: Payer: Self-pay | Admitting: Internal Medicine

## 2020-04-12 ENCOUNTER — Other Ambulatory Visit: Payer: Self-pay | Admitting: Student

## 2020-04-12 NOTE — Telephone Encounter (Signed)
We can send her tramadol for 3 days in anticipation of bone marrow tomorrow but I cannot prescribe long tem pain meds. She needs to speak to Dr. Derrel Nip about it

## 2020-04-12 NOTE — Telephone Encounter (Signed)
Patient called reporting that her pain has been traveling and that she is now having pain in her shoulders and sternum. The pain that was in her lower back and legs has improved. Denies pain into the jaw or down arms and she describes the pain as "aching" side effects states she is having a BMBX tomorrow so she is asking what she can tae for the pain. Please advise

## 2020-04-12 NOTE — Telephone Encounter (Signed)
Patient states she is allergic to Tramadol and not to worry about it, "II was just wondering if I could take Ibuprofen" I advised that with Procedure she should not take NSAID and she stated "don't worry about it, I will just suffer with it"

## 2020-04-12 NOTE — Telephone Encounter (Signed)
She can take ibuprofen. No need to hold for bone marrow biopsy

## 2020-04-12 NOTE — Telephone Encounter (Signed)
Patient informed that per Dr Janese Banks she can take ibuprofen

## 2020-04-13 ENCOUNTER — Other Ambulatory Visit: Payer: Self-pay

## 2020-04-13 ENCOUNTER — Ambulatory Visit
Admission: RE | Admit: 2020-04-13 | Discharge: 2020-04-13 | Disposition: A | Discharge status: Home / Self Care | Payer: Commercial Managed Care - PPO | Source: Ambulatory Visit | Attending: Oncology | Admitting: Oncology

## 2020-04-13 DIAGNOSIS — I10 Essential (primary) hypertension: Secondary | ICD-10-CM | POA: Diagnosis not present

## 2020-04-13 DIAGNOSIS — Z79899 Other long term (current) drug therapy: Secondary | ICD-10-CM | POA: Diagnosis not present

## 2020-04-13 DIAGNOSIS — K219 Gastro-esophageal reflux disease without esophagitis: Secondary | ICD-10-CM | POA: Insufficient documentation

## 2020-04-13 DIAGNOSIS — D708 Other neutropenia: Secondary | ICD-10-CM

## 2020-04-13 DIAGNOSIS — R5383 Other fatigue: Secondary | ICD-10-CM | POA: Insufficient documentation

## 2020-04-13 DIAGNOSIS — Z7951 Long term (current) use of inhaled steroids: Secondary | ICD-10-CM | POA: Diagnosis not present

## 2020-04-13 DIAGNOSIS — Z800 Family history of malignant neoplasm of digestive organs: Secondary | ICD-10-CM | POA: Diagnosis not present

## 2020-04-13 DIAGNOSIS — Z6838 Body mass index (BMI) 38.0-38.9, adult: Secondary | ICD-10-CM | POA: Diagnosis not present

## 2020-04-13 DIAGNOSIS — J45909 Unspecified asthma, uncomplicated: Secondary | ICD-10-CM | POA: Insufficient documentation

## 2020-04-13 DIAGNOSIS — E669 Obesity, unspecified: Secondary | ICD-10-CM | POA: Insufficient documentation

## 2020-04-13 DIAGNOSIS — Z792 Long term (current) use of antibiotics: Secondary | ICD-10-CM | POA: Insufficient documentation

## 2020-04-13 DIAGNOSIS — Z87891 Personal history of nicotine dependence: Secondary | ICD-10-CM | POA: Diagnosis not present

## 2020-04-13 LAB — CBC WITH DIFFERENTIAL/PLATELET
Abs Immature Granulocytes: 0 10*3/uL (ref 0.00–0.07)
Basophils Absolute: 0 10*3/uL (ref 0.0–0.1)
Basophils Relative: 1 %
Eosinophils Absolute: 0.1 10*3/uL (ref 0.0–0.5)
Eosinophils Relative: 3 %
HCT: 40 % (ref 36.0–46.0)
Hemoglobin: 13.2 g/dL (ref 12.0–15.0)
Immature Granulocytes: 0 %
Lymphocytes Relative: 58 %
Lymphs Abs: 1.6 10*3/uL (ref 0.7–4.0)
MCH: 30.3 pg (ref 26.0–34.0)
MCHC: 33 g/dL (ref 30.0–36.0)
MCV: 92 fL (ref 80.0–100.0)
Monocytes Absolute: 0.5 10*3/uL (ref 0.1–1.0)
Monocytes Relative: 20 %
Neutro Abs: 0.5 10*3/uL — ABNORMAL LOW (ref 1.7–7.7)
Neutrophils Relative %: 18 %
Platelets: 191 10*3/uL (ref 150–400)
RBC: 4.35 MIL/uL (ref 3.87–5.11)
RDW: 12.7 % (ref 11.5–15.5)
Smear Review: NORMAL
WBC: 2.7 10*3/uL — ABNORMAL LOW (ref 4.0–10.5)
nRBC: 0 % (ref 0.0–0.2)

## 2020-04-13 MED ORDER — FENTANYL CITRATE (PF) 100 MCG/2ML IJ SOLN
INTRAMUSCULAR | Status: AC | PRN
  Administered 2020-04-13 (×2): 50 ug via INTRAVENOUS

## 2020-04-13 MED ORDER — MIDAZOLAM HCL 2 MG/2ML IJ SOLN
INTRAMUSCULAR | Status: AC | PRN
  Administered 2020-04-13 (×3): 1 mg via INTRAVENOUS

## 2020-04-13 MED ORDER — FENTANYL CITRATE (PF) 100 MCG/2ML IJ SOLN
INTRAMUSCULAR | Status: AC
  Filled 2020-04-13: qty 2

## 2020-04-13 MED ORDER — SODIUM CHLORIDE 0.9 % IV SOLN: INTRAVENOUS | Status: DC

## 2020-04-13 MED ORDER — HEPARIN SOD (PORK) LOCK FLUSH 100 UNIT/ML IV SOLN
INTRAVENOUS | Status: AC
  Filled 2020-04-13: qty 5

## 2020-04-13 MED ORDER — MIDAZOLAM HCL 5 MG/5ML IJ SOLN
INTRAMUSCULAR | Status: AC
  Filled 2020-04-13: qty 5

## 2020-04-13 NOTE — Progress Notes (Signed)
Patient clinically stable post BMB per Dr Kathlene Cote, tolerated well. Awake during procedure but stated comfort,awake/alert and oriented post procedure. Discharge instructions given to Molly Stone post procedure with questions answered. Denies complaints post procedure. Taking po's without difficulty. bandade dry/intact to posterior sacral area.

## 2020-04-13 NOTE — Discharge Instructions (Signed)
Moderate Conscious Sedation, Adult, Care After These instructions provide you with information about caring for yourself after your procedure. Your health care provider may also give you more specific instructions. Your treatment has been planned according to current medical practices, but problems sometimes occur. Call your health care provider if you have any problems or questions after your procedure. What can I expect after the procedure? After your procedure, it is common:  To feel sleepy for several hours.  To feel clumsy and have poor balance for several hours.  To have poor judgment for several hours.  To vomit if you eat too soon. Follow these instructions at home: For at least 24 hours after the procedure:   Do not: ? Participate in activities where you could fall or become injured. ? Drive. ? Use heavy machinery. ? Drink alcohol. ? Take sleeping pills or medicines that cause drowsiness. ? Make important decisions or sign legal documents. ? Take care of children on your own.  Rest. Eating and drinking  Follow the diet recommended by your health care provider.  If you vomit: ? Drink water, juice, or soup when you can drink without vomiting. ? Make sure you have little or no nausea before eating solid foods. General instructions  Have a responsible adult stay with you until you are awake and alert.  Take over-the-counter and prescription medicines only as told by your health care provider.  If you smoke, do not smoke without supervision.  Keep all follow-up visits as told by your health care provider. This is important. Contact a health care provider if:  You keep feeling nauseous or you keep vomiting.  You feel light-headed.  You develop a rash.  You have a fever. Get help right away if:  You have trouble breathing. This information is not intended to replace advice given to you by your health care provider. Make sure you discuss any questions you have  with your health care provider. Document Revised: 09/19/2017 Document Reviewed: 01/27/2016 Elsevier Patient Education  2020 [place]. Bone Marrow Aspiration and Bone Marrow Biopsy, Adult, Care After This sheet gives you information about how to care for yourself after your procedure. Your health care provider may also give you more specific instructions. If you have problems or questions, contact your health care provider. What can I expect after the procedure? After the procedure, it is common to have:  Mild pain and tenderness.  Swelling.  Bruising. Follow these instructions at home: Puncture site care   Follow instructions from your health care provider about how to take care of the puncture site. Make sure you: ? Wash your hands with soap and water before and after you change your bandage (dressing). If soap and water are not available, use hand sanitizer. ? Change your dressing as told by your health care provider.  Check your puncture site every day for signs of infection. Check for: ? More redness, swelling, or pain. ? Fluid or blood. ? Warmth. ? Pus or a bad smell. Activity  Return to your normal activities as told by your health care provider. Ask your health care provider what activities are safe for you.  Do not lift anything that is heavier than 10 lb (4.5 kg), or the limit that you are told, until your health care provider says that it is safe.  Do not drive for 24 hours if you were given a sedative during your procedure. General instructions   Take over-the-counter and prescription medicines only as told by your  health care provider.  Do not take baths, swim, or use a hot tub until your health care provider approves. Ask your health care provider if you may take showers. You may only be allowed to take sponge baths.  If directed, put ice on the affected area. To do this: ? Put ice in a plastic bag. ? Place a towel between your skin and the bag. ? Leave  the ice on for 20 minutes, 2-3 times a day.  Keep all follow-up visits as told by your health care provider. This is important. Contact a health care provider if:  Your pain is not controlled with medicine.  You have a fever.  You have more redness, swelling, or pain around the puncture site.  You have fluid or blood coming from the puncture site.  Your puncture site feels warm to the touch.  You have pus or a bad smell coming from the puncture site. Summary  After the procedure, it is common to have mild pain, tenderness, swelling, and bruising.  Follow instructions from your health care provider about how to take care of the puncture site and what activities are safe for you.  Take over-the-counter and prescription medicines only as told by your health care provider.  Contact a health care provider if you have any signs of infection, such as fluid or blood coming from the puncture site. This information is not intended to replace advice given to you by your health care provider. Make sure you discuss any questions you have with your health care provider. Document Revised: 02/23/2019 Document Reviewed: 02/23/2019 Elsevier Patient Education  [place].

## 2020-04-13 NOTE — H&P (Signed)
Chief Complaint: Patient was seen in consultation today for bone marrow biopsy at the request of Rao,Archana C  Referring Physician(s): Rao,Archana C  Patient Status: [facility] - Out-pt  History of Present Illness: Molly Stone is a 39 y.o. female with persistent leukopenia/neutropenia and low ANC here for bone marrow biopsy for further work up. Has fatigue. History of asthma.   Past Medical History:  Diagnosis Date  . Allergy   . Anemia   . Anxiety   . Asthma   . Chlamydia    h/o  . DDD (degenerative disc disease), cervical 04/07/2019  . Disorder of vocal cord    spasmodic dysphonia  . Dysmenorrhea   . Dyspareunia, female   . Endometriosis   . Family history of endometriosis   . GERD (gastroesophageal reflux disease)   . Headache    migraine  . Heavy periods   . Hiatal hernia   . History of nephrolithiasis   . Hypertension   . Jaundice, physiologic, newborn   . Obesity (BMI 30-39.9)   . Sleep apnea   . Tobacco user   . Vaginal Pap smear, abnormal     Past Surgical History:  Procedure Laterality Date  . DILATATION & CURETTAGE/HYSTEROSCOPY WITH MYOSURE N/A 08/27/2018   Procedure: DILATATION & CURETTAGE/HYSTEROSCOPY WITH MINERVA;  Surgeon: Gae Dry, MD;  Location: [facility] ORS;  Service: Gynecology;  Laterality: N/A;  . DILATION AND CURETTAGE OF UTERUS    . ESOPHAGOGASTRODUODENOSCOPY (EGD) WITH PROPOFOL N/A 08/07/2018   Procedure: ESOPHAGOGASTRODUODENOSCOPY (EGD) WITH PROPOFOL;  Surgeon: Jonathon Bellows, MD;  Location: [facility] ENDOSCOPY;  Service: Gastroenterology;  Laterality: N/A;  . laparoscopy    . ROOT CANAL    . WISDOM TOOTH EXTRACTION      Allergies: Buprenorphine hcl, Morphine and related, Prednisone, and Tramadol hcl  Medications: Prior to Admission medications   Medication Sig Start Date End Date Taking? Authorizing Provider  ADVAIR HFA 230-21 MCG/ACT inhaler USE 2 INHALATIONS TWICE A DAY 04/12/20  Yes Crecencio Mc, MD  albuterol (PROAIR HFA) 108  (90 Base) MCG/ACT inhaler Inhale 1-2 puffs into the lungs every 6 (six) hours as needed for wheezing or shortness of breath. 12/22/19  Yes Crecencio Mc, MD  amoxicillin-clavulanate (AUGMENTIN) 875-125 MG tablet Take 1 tablet by mouth 2 (two) times daily. 03/30/20  Yes Crecencio Mc, MD  Cholecalciferol (VITAMIN D3) 125 MCG (5000 UT) TABS Take 1 tablet by mouth daily.    Yes [provider]  famotidine (PEPCID) 20 MG tablet Take 20 mg by mouth 2 (two) times daily.   Yes [provider]  montelukast (SINGULAIR) 10 MG tablet TAKE 1 TABLET AT BEDTIME 11/23/19  Yes Crecencio Mc, MD  Multiple Vitamin (MULTIVITAMIN) tablet Take 1 tablet by mouth daily.   Yes [provider]  nebivolol (BYSTOLIC) 5 MG tablet Take 1 tablet (5 mg total) by mouth daily. 12/22/19  Yes Minna Merritts, MD  pantoprazole (PROTONIX) 40 MG tablet TAKE 1 TABLET TWICE A DAY 06/22/19  Yes Jonathon Bellows, MD  vitamin B-12 (CYANOCOBALAMIN) 1000 MCG tablet Take 1,000 mcg by mouth daily.   Yes [provider]  EPINEPHrine 0.3 mg/0.3 mL IJ SOAJ injection See admin instructions. for allergic reaction 02/17/20   [provider]     Family History  Problem Relation Age of Onset  . Endometriosis Mother   . Cancer Father        benign liver CA  . Stroke Father  recurrent blood clots on the brain   . Lupus Father   . Cancer Maternal Grandmother 30       ovarian Ca, still surviving   . Osteoporosis Maternal Grandmother   . Ovarian cancer Maternal Grandmother   . Heart disease Maternal Grandfather   . Cancer Maternal Grandfather 48       colon CA. metastatic  . Diabetes Maternal Uncle   . Heart disease Paternal Grandmother   . CAD Brother 42       early at age 66  . Breast cancer Neg Hx   . Colon cancer Neg Hx   . Kidney cancer Neg Hx   . Bladder Cancer Neg Hx     Social History   Socioeconomic History  . Marital status: Married    Spouse name: Not on file  . Number of  children: Not on file  . Years of education: Not on file  . Highest education level: Not on file  Occupational History  . Not on file  Tobacco Use  . Smoking status: Former Smoker    Years: 10.00    Types: Cigarettes    Quit date: 10/2016    Years since quitting: 3.4  . Smokeless tobacco: Never Used  Vaping Use  . Vaping Use: Former  Substance and Sexual Activity  . Alcohol use: Not Currently    Alcohol/week: 0.0 standard drinks  . Drug use: No  . Sexual activity: Yes    Birth control/protection: None    Comment: vasectomy  Other Topics Concern  . Not on file  Social History Narrative  . Not on file   Social Determinants of Health   Financial Resource Strain:   . Difficulty of Paying Living Expenses:   Food Insecurity:   . Worried About Charity fundraiser in the Last Year:   . Arboriculturist in the Last Year:   Transportation Needs:   . Film/video editor (Medical):   Marland Kitchen Lack of Transportation (Non-Medical):   Physical Activity:   . Days of Exercise per Week:   . Minutes of Exercise per Session:   Stress:   . Feeling of Stress :   Social Connections:   . Frequency of Communication with Friends and Family:   . Frequency of Social Gatherings with Friends and Family:   . Attends Religious Services:   . Active Member of Clubs or Organizations:   . Attends Archivist Meetings:   Marland Kitchen Marital Status:     Review of Systems: A 12 point ROS discussed and pertinent positives are indicated in the HPI above.  All other systems are negative.  Review of Systems  Constitutional: Positive for fatigue. Negative for activity change, appetite change, chills, diaphoresis, fever and unexpected weight change.  Respiratory: Negative.   Cardiovascular: Negative.   Gastrointestinal: Negative.   Genitourinary: Negative.   Musculoskeletal: Negative.   Skin: Negative.   Neurological: Negative.   Hematological: Negative for adenopathy. Does not bruise/bleed easily.     Vital Signs: BP 115/68   Pulse 88   Temp 98 F (36.7 C) (Oral)   Resp 20   Ht 5' 9.5" (1.765 m)   Wt 121.1 kg   SpO2 100%   BMI 38.86 kg/m   Physical Exam Vitals reviewed.  Constitutional:      General: She is not in acute distress.    Appearance: Normal appearance. She is obese. She is not ill-appearing, toxic-appearing or diaphoretic.  HENT:     Head:  Normocephalic and atraumatic.  Cardiovascular:     Rate and Rhythm: Normal rate and regular rhythm.     Heart sounds: Normal heart sounds. No murmur heard.  No gallop.   Pulmonary:     Effort: Pulmonary effort is normal. No respiratory distress.     Breath sounds: Normal breath sounds. No stridor. No wheezing, rhonchi or rales.  Abdominal:     General: Bowel sounds are normal. There is no distension.     Palpations: Abdomen is soft. There is no mass.     Tenderness: There is no abdominal tenderness. There is no guarding or rebound.  Musculoskeletal:        General: No swelling.  Skin:    General: Skin is warm and dry.  Neurological:     General: No focal deficit present.     Mental Status: She is alert and oriented to person, place, and time.     Imaging: No results found.  Labs:  CBC: Recent Labs    02/07/20 1110 03/09/20 1429 03/27/20 1501 04/13/20 0750  WBC 2.7* 2.8* 2.4* 2.7*  HGB 14.3 13.4 14.1 13.2  HCT 42.1 39.5 41.7 40.0  PLT 163.0 144.0* 191 191    COAGS: No results for input(s): INR, APTT in the last 8760 hours.  BMP: Recent Labs    02/07/20 1110  NA 135  K 4.0  CL 101  CO2 28  GLUCOSE 94  BUN 13  CALCIUM 8.9  CREATININE 0.80    LIVER FUNCTION TESTS: Recent Labs    02/07/20 1110  BILITOT 0.6  AST 17  ALT 16  ALKPHOS 57  PROT 7.4  ALBUMIN 4.1    Assessment and Plan:  For CT guided bone marrow biopsy today. Risks and benefits of bone marrow biopsy was discussed with the patient including, but not limited to bleeding, infection, damage to adjacent structures or low  yield requiring additional tests. All of the questions were answered and there is agreement to proceed. Consent signed and in chart.  Thank you for this interesting consult.  I greatly enjoyed meeting ELLIETT GUARISCO and look forward to participating in their care.  A copy of this report was sent to the requesting provider on this date.  Electronically Signed: Azzie Roup, MD 04/13/2020, 8:39 AM     I spent a total of 15 Minutes in face to face in clinical consultation, greater than 50% of which was counseling/coordinating care for bone marrow biopsy.

## 2020-04-13 NOTE — Procedures (Signed)
Interventional Radiology Procedure Note  Procedure: CT guided bone marrow aspiration and biopsy  Complications: None  EBL: < 10 mL  Findings: Aspirate and core biopsy performed of bone marrow in right iliac bone.  Plan: Bedrest supine x 1 hrs  Molly Stone T. Molly Stone, M.D Pager:  [phone]   

## 2020-04-14 LAB — SURGICAL PATHOLOGY

## 2020-04-20 ENCOUNTER — Other Ambulatory Visit: Payer: Self-pay

## 2020-04-20 ENCOUNTER — Encounter: Payer: Self-pay | Admitting: Oncology

## 2020-04-20 ENCOUNTER — Inpatient Hospital Stay: Payer: Commercial Managed Care - PPO | Attending: Oncology | Admitting: Oncology

## 2020-04-20 VITALS — BP 111/76 | HR 81 | Temp 98.4°F | Resp 16 | Wt 286.1 lb

## 2020-04-20 DIAGNOSIS — Z87891 Personal history of nicotine dependence: Secondary | ICD-10-CM | POA: Insufficient documentation

## 2020-04-20 DIAGNOSIS — G4730 Sleep apnea, unspecified: Secondary | ICD-10-CM | POA: Insufficient documentation

## 2020-04-20 DIAGNOSIS — Z87442 Personal history of urinary calculi: Secondary | ICD-10-CM | POA: Insufficient documentation

## 2020-04-20 DIAGNOSIS — I10 Essential (primary) hypertension: Secondary | ICD-10-CM | POA: Insufficient documentation

## 2020-04-20 DIAGNOSIS — D709 Neutropenia, unspecified: Secondary | ICD-10-CM | POA: Insufficient documentation

## 2020-04-20 DIAGNOSIS — J45909 Unspecified asthma, uncomplicated: Secondary | ICD-10-CM | POA: Insufficient documentation

## 2020-04-20 DIAGNOSIS — Z79899 Other long term (current) drug therapy: Secondary | ICD-10-CM | POA: Diagnosis not present

## 2020-04-20 DIAGNOSIS — D708 Other neutropenia: Secondary | ICD-10-CM | POA: Diagnosis not present

## 2020-04-20 DIAGNOSIS — K219 Gastro-esophageal reflux disease without esophagitis: Secondary | ICD-10-CM | POA: Insufficient documentation

## 2020-04-20 DIAGNOSIS — E669 Obesity, unspecified: Secondary | ICD-10-CM | POA: Insufficient documentation

## 2020-04-20 DIAGNOSIS — Z7951 Long term (current) use of inhaled steroids: Secondary | ICD-10-CM | POA: Insufficient documentation

## 2020-04-20 NOTE — Progress Notes (Signed)
Patient here for oncology follow-up appointment, expresses concerns of headaches, shortness of breath, palpitations.

## 2020-04-20 NOTE — Progress Notes (Signed)
Hematology/Oncology Consult note [facility]  Telephone:([phone] [phone]  Patient Care Team: Crecencio Mc, MD as PCP - General (Internal Medicine)   Name of the patient: Molly Stone  [id number]  Mar 09, 1981   Date of visit: 04/20/20  Diagnosis-neutropenia of unclear etiology viral versus autoimmune  Chief complaint/ Reason for visit-discuss bone marrow biopsy results and further management  Heme/Onc history:  patient is a 39 year old female with a past medical history significant for spinal stenosis, obstructive sleep apnea referred for leukopenia/neutropenia.She has been feeling poorly for the last 1 month and reports having significant fatigue. Reports occasional chest pain which lasts for a few minutes and then resolves on its own. Denies any fever. Appetite and weight have remained stable.Denies any history of autoimmune disorders. Looking back at her prior CBCs.Patient has had a normal white cell count with a normal differential up until 2019 when she was noted to have leukopenia for the first time in November 2019. On 09/11/2018 her white count was 3.1 with an [place] of 1. Following that her white cell count normalized but was again found to be low in June 2020 when her white count was 3.2 with an ANC of 0.9. ANC improved to 1.3 in September 2020 but since then her [place] has been trending down to 800 in April 2021 and 700 on 03/09/2020. Hemoglobin is always remained stable between 13-14 and platelets have been normal. She had some initial work-up done by Dr. Derrel Nip and was found to have borderline low B12 levels of 287. Copper levels and folate was normal. Iron studies were within normal limits. She also had HIV testing and hep B and hep C testing which was all negative.  Results of blood work from 03/27/2020 were as follows: CBC showed white count of 2.4, H&H of 14.1/41.7 and a platelet count of 191.  ANC was low at 500.  Flow cytometry  was unable to evaluate B-cell for allergy due to nonspecific light chain binding.  ANA comprehensive panel was negative.  Pathology smear review showed relative neutropenia.  Normal erythrocytes and normal platelets.  Bone marrow biopsy showed normocellular marrow with trilineage hematopoiesis including abundant granulocytic cells and mature neutrophils with nonspecific changes.  No increase in blastic cells.  No evidence of lymphoproliferative disorder.  In the setting peripheral neutropenia may be related to medication immune mediated process flow cytometry on the marrow was normal.   Interval history-patient still reports feeling fatigued.  Denies any fever, cough or worsening shortness of breath.  States she was diagnosed with multiple food allergies recently  ECOG PS- 0 Pain scale- 0   Review of systems- Review of Systems  Constitutional: Positive for malaise/fatigue. Negative for chills, fever and weight loss.  HENT: Negative for congestion, ear discharge and nosebleeds.   Eyes: Negative for blurred vision.  Respiratory: Negative for cough, hemoptysis, sputum production, shortness of breath and wheezing.   Cardiovascular: Negative for chest pain, palpitations, orthopnea and claudication.  Gastrointestinal: Negative for abdominal pain, blood in stool, constipation, diarrhea, heartburn, melena, nausea and vomiting.  Genitourinary: Negative for dysuria, flank pain, frequency, hematuria and urgency.  Musculoskeletal: Negative for back pain, joint pain and myalgias.  Skin: Negative for rash.  Neurological: Negative for dizziness, tingling, focal weakness, seizures, weakness and headaches.  Endo/Heme/Allergies: Does not bruise/bleed easily.  Psychiatric/Behavioral: Negative for depression and suicidal ideas. The patient does not have insomnia.       Allergies  Allergen Reactions   Buprenorphine Hcl Itching and  Other (See Comments)    And shaking   Morphine And Related Itching and  Other (See Comments)    And shaking   Prednisone Anxiety and Palpitations   Tramadol Hcl Other (See Comments)    anxious     Past Medical History:  Diagnosis Date   Allergy    Anemia    Anxiety    Asthma    Chlamydia    h/o   DDD (degenerative disc disease), cervical 04/07/2019   Disorder of vocal cord    spasmodic dysphonia   Dysmenorrhea    Dyspareunia, female    Endometriosis    Family history of endometriosis    GERD (gastroesophageal reflux disease)    Headache    migraine   Heavy periods    Hiatal hernia    History of nephrolithiasis    Hypertension    Jaundice, physiologic, newborn    Obesity (BMI 30-39.9)    Sleep apnea    Tobacco user    Vaginal Pap smear, abnormal      Past Surgical History:  Procedure Laterality Date   DILATATION & CURETTAGE/HYSTEROSCOPY WITH MYOSURE N/A 08/27/2018   Procedure: DILATATION & CURETTAGE/HYSTEROSCOPY WITH MINERVA;  Surgeon: Gae Dry, MD;  Location: [facility] ORS;  Service: Gynecology;  Laterality: N/A;   DILATION AND CURETTAGE OF UTERUS     ESOPHAGOGASTRODUODENOSCOPY (EGD) WITH PROPOFOL N/A 08/07/2018   Procedure: ESOPHAGOGASTRODUODENOSCOPY (EGD) WITH PROPOFOL;  Surgeon: Jonathon Bellows, MD;  Location: [facility] ENDOSCOPY;  Service: Gastroenterology;  Laterality: N/A;   laparoscopy     ROOT CANAL     WISDOM TOOTH EXTRACTION      Social History   Socioeconomic History   Marital status: Married    Spouse name: Not on file   Number of children: Not on file   Years of education: Not on file   Highest education level: Not on file  Occupational History   Not on file  Tobacco Use   Smoking status: Former Smoker    Years: 10.00    Types: Cigarettes    Quit date: 10/2016    Years since quitting: 3.4   Smokeless tobacco: Never Used  Scientific laboratory technician Use: Former  Substance and Sexual Activity   Alcohol use: Not Currently    Alcohol/week: 0.0 standard drinks   Drug use: No    Sexual activity: Yes    Birth control/protection: None    Comment: vasectomy  Other Topics Concern   Not on file  Social History Narrative   Not on file   Social Determinants of Health   Financial Resource Strain:    Difficulty of Paying Living Expenses:   Food Insecurity:    Worried About Charity fundraiser in the Last Year:    Arboriculturist in the Last Year:   Transportation Needs:    Film/video editor (Medical):    Lack of Transportation (Non-Medical):   Physical Activity:    Days of Exercise per Week:    Minutes of Exercise per Session:   Stress:    Feeling of Stress :   Social Connections:    Frequency of Communication with Friends and Family:    Frequency of Social Gatherings with Friends and Family:    Attends Religious Services:    Active Member of Clubs or Organizations:    Attends Archivist Meetings:    Marital Status:   Intimate Partner Violence:    Fear of Current or Ex-Partner:    Emotionally  Abused:    Physically Abused:    Sexually Abused:     Family History  Problem Relation Age of Onset   Endometriosis Mother    Cancer Father        benign liver CA   Stroke Father        recurrent blood clots on the brain    Lupus Father    Cancer Maternal Grandmother 91       ovarian Ca, still surviving    Osteoporosis Maternal Grandmother    Ovarian cancer Maternal Grandmother    Heart disease Maternal Grandfather    Cancer Maternal Grandfather 71       colon CA. metastatic   Diabetes Maternal Uncle    Heart disease Paternal Grandmother    CAD Brother 89       early at age 6   Breast cancer Neg Hx    Colon cancer Neg Hx    Kidney cancer Neg Hx    Bladder Cancer Neg Hx      Current Outpatient Medications:    ADVAIR HFA 230-21 MCG/ACT inhaler, USE 2 INHALATIONS TWICE A DAY, Disp: 36 g, Rfl: 3   albuterol (PROAIR HFA) 108 (90 Base) MCG/ACT inhaler, Inhale 1-2 puffs into the lungs every 6 (six)  hours as needed for wheezing or shortness of breath., Disp: 8 g, Rfl: 2   Bacillus Coagulans-Inulin (PROBIOTIC-PREBIOTIC PO), Take by mouth., Disp: , Rfl:    Cholecalciferol (VITAMIN D3) 125 MCG (5000 UT) TABS, Take 1 tablet by mouth daily. , Disp: , Rfl:    EPINEPHrine 0.3 mg/0.3 mL IJ SOAJ injection, See admin instructions. for allergic reaction, Disp: , Rfl:    famotidine (PEPCID) 20 MG tablet, Take 20 mg by mouth 2 (two) times daily., Disp: , Rfl:    ibuprofen (ADVIL) 800 MG tablet, Take 800 mg by mouth every 8 (eight) hours as needed., Disp: , Rfl:    montelukast (SINGULAIR) 10 MG tablet, TAKE 1 TABLET AT BEDTIME, Disp: 90 tablet, Rfl: 3   Multiple Vitamin (MULTIVITAMIN) tablet, Take 1 tablet by mouth daily., Disp: , Rfl:    nebivolol (BYSTOLIC) 5 MG tablet, Take 1 tablet (5 mg total) by mouth daily., Disp: 90 tablet, Rfl: 3   pantoprazole (PROTONIX) 40 MG tablet, TAKE 1 TABLET TWICE A DAY, Disp: 180 tablet, Rfl: 3   vitamin B-12 (CYANOCOBALAMIN) 1000 MCG tablet, Take 1,000 mcg by mouth daily., Disp: , Rfl:    amoxicillin-clavulanate (AUGMENTIN) 875-125 MG tablet, Take 1 tablet by mouth 2 (two) times daily. (Patient not taking: Reported on 04/20/2020), Disp: 14 tablet, Rfl: 0  Physical exam:  Vitals:   04/20/20 1016  BP: 111/76  Pulse: 81  Resp: 16  Temp: 98.4 F (36.9 C)  TempSrc: Oral  SpO2: 100%  Weight: 286 lb 1.6 oz (129.8 kg)   Physical Exam Constitutional:      General: She is not in acute distress. Pulmonary:     Effort: Pulmonary effort is normal.  Skin:    General: Skin is warm and dry.  Neurological:     Mental Status: She is alert and oriented to person, place, and time.      CMP Latest Ref Rng & Units 02/07/2020  Glucose 70 - 99 mg/dL 94  BUN 6 - 23 mg/dL 13  Creatinine 0.40 - 1.20 mg/dL 0.80  Sodium 135 - 145 mEq/L 135  Potassium 3.5 - 5.1 mEq/L 4.0  Chloride 96 - 112 mEq/L 101  CO2 19 - 32 mEq/L 28  Calcium 8.4 - 10.5 mg/dL 8.9  Total Protein  6.0 - 8.3 g/dL 7.4  Total Bilirubin 0.2 - 1.2 mg/dL 0.6  Alkaline Phos 39 - 117 U/L 57  AST 0 - 37 U/L 17  ALT 0 - 35 U/L 16   CBC Latest Ref Rng & Units 04/13/2020  WBC 4.0 - 10.5 K/uL 2.7(L)  Hemoglobin 12.0 - 15.0 g/dL 13.2  Hematocrit 36 - 46 % 40.0  Platelets 150 - 400 K/uL 191    No images are attached to the encounter.  CT BONE MARROW BIOPSY & ASPIRATION  Result Date: 04/13/2020 CLINICAL DATA:  Persistent neutropenia and leukopenia. Bone marrow biopsy required for further workup. EXAM: CT GUIDED BONE MARROW ASPIRATION AND BIOPSY ANESTHESIA/SEDATION: Versed 3.0 mg IV, Fentanyl 100 mcg IV Total Moderate Sedation Time:   14 minutes. The patient's level of consciousness and physiologic status were continuously monitored during the procedure by Radiology nursing. PROCEDURE: The procedure risks, benefits, and alternatives were explained to the patient. Questions regarding the procedure were encouraged and answered. The patient understands and consents to the procedure. A time out was performed prior to initiating the procedure. The right gluteal region was prepped with chlorhexidine. Sterile gown and sterile gloves were used for the procedure. Local anesthesia was provided with 1% Lidocaine. Under CT guidance, an 11 gauge On Control bone cutting needle was advanced from a posterior approach into the right iliac bone. Needle positioning was confirmed with CT. Initial non heparinized and heparinized aspirate samples were obtained of bone marrow. Core biopsy was performed via the On Control drill needle. COMPLICATIONS: None FINDINGS: Inspection of initial aspirate did reveal visible particles. Intact core biopsy sample was obtained. IMPRESSION: CT guided bone marrow biopsy of right posterior iliac bone with both aspirate and core samples obtained. Electronically Signed   By: Aletta Edouard M.D.   On: 04/13/2020 10:12     Assessment and plan- Patient is a 40 y.o. female referred for neutropenia of  unclear etiology  Neutropenia: Peripheral blood work-up including HIV hepatitis, vasculitis panel was unremarkable.  Flow cytometry was unremarkable.  Bone marrow biopsy showed no evidence of primary bone marrow disorder such as lymphoma or leukemia.  Abundant granulocytic cells were noted.  This may indicate peripheral destruction secondary to viral etiology versus autoimmune.  At this time I am inclined to monitor her neutropenia conservatively with a repeat CBC with differential in 2 weeks in 4 weeks.  If there is no significant improvement in her neutropenia I will give her a trial of steroids for possible autoimmune etiology.  Patient and her mother verbalized understanding.  Video visit in 4 weeks   Visit Diagnosis 1. Other neutropenia ([place])      Dr. Randa Evens, MD, MPH [facility] [phone] 04/20/2020 11:49 AM

## 2020-04-21 ENCOUNTER — Encounter (HOSPITAL_COMMUNITY): Payer: Self-pay | Admitting: Oncology

## 2020-05-03 ENCOUNTER — Telehealth (INDEPENDENT_AMBULATORY_CARE_PROVIDER_SITE_OTHER): Payer: Commercial Managed Care - PPO | Admitting: Internal Medicine

## 2020-05-03 ENCOUNTER — Encounter: Payer: Self-pay | Admitting: Internal Medicine

## 2020-05-03 DIAGNOSIS — J0111 Acute recurrent frontal sinusitis: Secondary | ICD-10-CM | POA: Diagnosis not present

## 2020-05-03 MED ORDER — PREDNISONE 20 MG PO TABS: 20.0000 mg | ORAL_TABLET | Freq: Every day | ORAL | 0 refills | Status: DC

## 2020-05-03 MED ORDER — AMOXICILLIN-POT CLAVULANATE 875-125 MG PO TABS
1.0000 | ORAL_TABLET | Freq: Two times a day (BID) | ORAL | 0 refills | Status: DC
Start: 2020-05-03 — End: 2020-08-11

## 2020-05-03 NOTE — Progress Notes (Signed)
Virtual Visit via [facility]   This visit type was conducted due to national recommendations for restrictions regarding the COVID-19 pandemic (e.g. social distancing).  This format is felt to be most appropriate for this patient at this time.  All issues noted in this document were discussed and addressed.  No physical exam was performed (except for noted visual exam findings with Video Visits).   I connected with@ on 05/03/20 at  4:30 PM EDT by a video enabled telemedicine application and verified that I am speaking with the correct person using two identifiers. Location patient: home Location provider: work or home office Persons participating in the virtual visit: patient, provider  I discussed the limitations, risks, security and privacy concerns of performing an evaluation and management service by telephone and the availability of in person appointments. I also discussed with the patient that there may be a patient responsible charge related to this service. The patient expressed understanding and agreed to proceed.   Reason for visit: upper respiratory tract infection   HPI: 39 yr old female with chronic neutropenia (workup underway) presents with 4 day history of head /sinus congestion,  Headache, post nasal drip and sore throat.  TemP  99.3 using one ibuprofen daily  No body aches , anosmia or dysgeusia.  No wheezing.  But has history of asthma and feels intermittently short of breath.  Coughed up a small amount of blood this morning,  None since . Has been checking 02 sats with home pulse oximeter,  Ranging from 96 to 99%.  .  Husband drives an 42 wheeler during the week and home on weekends,  But has been masking and careful.      ROS: See pertinent positives and negatives per HPI.  Past Medical History:  Diagnosis Date  . Allergy   . Anemia   . Anxiety   . Asthma   . Chlamydia    h/o  . DDD (degenerative disc disease), cervical 04/07/2019  . Disorder of vocal cord     spasmodic dysphonia  . Dysmenorrhea   . Dyspareunia, female   . Endometriosis   . Family history of endometriosis   . GERD (gastroesophageal reflux disease)   . Headache    migraine  . Heavy periods   . Hiatal hernia   . History of nephrolithiasis   . Hypertension   . Jaundice, physiologic, newborn   . Obesity (BMI 30-39.9)   . Sleep apnea   . Tobacco user   . Vaginal Pap smear, abnormal     Past Surgical History:  Procedure Laterality Date  . DILATATION & CURETTAGE/HYSTEROSCOPY WITH MYOSURE N/A 08/27/2018   Procedure: DILATATION & CURETTAGE/HYSTEROSCOPY WITH MINERVA;  Surgeon: Gae Dry, MD;  Location: [facility] ORS;  Service: Gynecology;  Laterality: N/A;  . DILATION AND CURETTAGE OF UTERUS    . ESOPHAGOGASTRODUODENOSCOPY (EGD) WITH PROPOFOL N/A 08/07/2018   Procedure: ESOPHAGOGASTRODUODENOSCOPY (EGD) WITH PROPOFOL;  Surgeon: Jonathon Bellows, MD;  Location: [facility] ENDOSCOPY;  Service: Gastroenterology;  Laterality: N/A;  . laparoscopy    . ROOT CANAL    . WISDOM TOOTH EXTRACTION      Family History  Problem Relation Age of Onset  . Endometriosis Mother   . Cancer Father        benign liver CA  . Stroke Father        recurrent blood clots on the brain   . Lupus Father   . Cancer Maternal Grandmother 30       ovarian Ca, still surviving   .  Osteoporosis Maternal Grandmother   . Ovarian cancer Maternal Grandmother   . Heart disease Maternal Grandfather   . Cancer Maternal Grandfather 69       colon CA. metastatic  . Diabetes Maternal Uncle   . Heart disease Paternal Grandmother   . CAD Brother 70       early at age 18  . Breast cancer Neg Hx   . Colon cancer Neg Hx   . Kidney cancer Neg Hx   . Bladder Cancer Neg Hx     SOCIAL HX:  reports that she quit smoking about 3 years ago. Her smoking use included cigarettes. She quit after 10.00 years of use. She has never used smokeless tobacco. She reports previous alcohol use. She reports that she does not use  drugs.   Current Outpatient Medications:  .  ADVAIR HFA 509-32 MCG/ACT inhaler, USE 2 INHALATIONS TWICE A DAY, Disp: 36 g, Rfl: 3 .  albuterol (PROAIR HFA) 108 (90 Base) MCG/ACT inhaler, Inhale 1-2 puffs into the lungs every 6 (six) hours as needed for wheezing or shortness of breath., Disp: 8 g, Rfl: 2 .  Bacillus Coagulans-Inulin (PROBIOTIC-PREBIOTIC PO), Take by mouth., Disp: , Rfl:  .  Cholecalciferol (VITAMIN D3) 125 MCG (5000 UT) TABS, Take 1 tablet by mouth daily. , Disp: , Rfl:  .  EPINEPHrine 0.3 mg/0.3 mL IJ SOAJ injection, See admin instructions. for allergic reaction, Disp: , Rfl:  .  famotidine (PEPCID) 20 MG tablet, Take 20 mg by mouth 2 (two) times daily., Disp: , Rfl:  .  ibuprofen (ADVIL) 800 MG tablet, Take 800 mg by mouth every 8 (eight) hours as needed., Disp: , Rfl:  .  montelukast (SINGULAIR) 10 MG tablet, TAKE 1 TABLET AT BEDTIME, Disp: 90 tablet, Rfl: 3 .  Multiple Vitamin (MULTIVITAMIN) tablet, Take 1 tablet by mouth daily., Disp: , Rfl:  .  nebivolol (BYSTOLIC) 5 MG tablet, Take 1 tablet (5 mg total) by mouth daily., Disp: 90 tablet, Rfl: 3 .  pantoprazole (PROTONIX) 40 MG tablet, TAKE 1 TABLET TWICE A DAY, Disp: 180 tablet, Rfl: 3 .  vitamin B-12 (CYANOCOBALAMIN) 1000 MCG tablet, Take 1,000 mcg by mouth daily., Disp: , Rfl:  .  amoxicillin-clavulanate (AUGMENTIN) 875-125 MG tablet, Take 1 tablet by mouth 2 (two) times daily., Disp: 14 tablet, Rfl: 0 .  predniSONE (DELTASONE) 20 MG tablet, Take 1 tablet (20 mg total) by mouth daily with breakfast., Disp: 5 tablet, Rfl: 0  EXAM:  VITALS per patient if applicable:  GENERAL: alert, oriented, appears well and in no acute distress  HEENT: atraumatic, conjunttiva clear, no obvious abnormalities on inspection of external nose and ears  NECK: normal movements of the head and neck  LUNGS: on inspection no signs of respiratory distress, breathing rate appears normal, no obvious gross SOB, gasping or wheezing  CV: no  obvious cyanosis  MS: moves all visible extremities without noticeable abnormality  PSYCH/NEURO: pleasant and cooperative, no obvious depression or anxiety, speech and thought processing grossly intact  ASSESSMENT AND PLAN:  Discussed the following assessment and plan:  Acute recurrent frontal sinusitis  Acute sinusitis Sinus drainage and congestion with cough productive of purulent sputum and headaches for the last  4  days. GIVEN HER NEUTROPENIA, will treat empirically with augmentin and low dose of prednisone    I discussed the assessment and treatment plan with the patient. The patient was provided an opportunity to ask questions and all were answered. The patient agreed with the plan and  demonstrated an understanding of the instructions.   The patient was advised to call back or seek an in-person evaluation if the symptoms worsen or if the condition fails to improve as anticipated.  I provided 20 minutes of non-face-to-face time during this encounter.   Crecencio Mc, MD

## 2020-05-04 ENCOUNTER — Other Ambulatory Visit: Payer: Self-pay

## 2020-05-04 ENCOUNTER — Ambulatory Visit: Payer: Commercial Managed Care - PPO | Attending: Internal Medicine

## 2020-05-04 ENCOUNTER — Inpatient Hospital Stay: Payer: Commercial Managed Care - PPO

## 2020-05-04 DIAGNOSIS — Z20822 Contact with and (suspected) exposure to covid-19: Secondary | ICD-10-CM

## 2020-05-04 DIAGNOSIS — D709 Neutropenia, unspecified: Secondary | ICD-10-CM | POA: Diagnosis not present

## 2020-05-04 DIAGNOSIS — D708 Other neutropenia: Secondary | ICD-10-CM

## 2020-05-04 LAB — CBC WITH DIFFERENTIAL/PLATELET
Abs Immature Granulocytes: 0.01 10*3/uL (ref 0.00–0.07)
Basophils Absolute: 0 10*3/uL (ref 0.0–0.1)
Basophils Relative: 0 %
Eosinophils Absolute: 0 10*3/uL (ref 0.0–0.5)
Eosinophils Relative: 0 %
HCT: 40.1 % (ref 36.0–46.0)
Hemoglobin: 13.6 g/dL (ref 12.0–15.0)
Immature Granulocytes: 0 %
Lymphocytes Relative: 21 %
Lymphs Abs: 1 10*3/uL (ref 0.7–4.0)
MCH: 30.3 pg (ref 26.0–34.0)
MCHC: 33.9 g/dL (ref 30.0–36.0)
MCV: 89.3 fL (ref 80.0–100.0)
Monocytes Absolute: 0.6 10*3/uL (ref 0.1–1.0)
Monocytes Relative: 12 %
Neutro Abs: 3.2 10*3/uL (ref 1.7–7.7)
Neutrophils Relative %: 67 %
Platelets: 201 10*3/uL (ref 150–400)
RBC: 4.49 MIL/uL (ref 3.87–5.11)
RDW: 12.4 % (ref 11.5–15.5)
WBC: 4.8 10*3/uL (ref 4.0–10.5)
nRBC: 0 % (ref 0.0–0.2)

## 2020-05-05 LAB — SARS-COV-2, NAA 2 DAY TAT

## 2020-05-05 LAB — NOVEL CORONAVIRUS, NAA: SARS-CoV-2, NAA: NOT DETECTED

## 2020-05-05 NOTE — Assessment & Plan Note (Signed)
Sinus drainage and congestion with cough productive of purulent sputum and headaches for the last  4  days. GIVEN HER NEUTROPENIA, will treat empirically with augmentin and low dose of prednisone

## 2020-05-16 ENCOUNTER — Other Ambulatory Visit: Payer: Self-pay | Admitting: Internal Medicine

## 2020-05-16 ENCOUNTER — Other Ambulatory Visit: Payer: Commercial Managed Care - PPO

## 2020-05-16 DIAGNOSIS — Z1231 Encounter for screening mammogram for malignant neoplasm of breast: Secondary | ICD-10-CM

## 2020-05-17 ENCOUNTER — Telehealth: Payer: Commercial Managed Care - PPO | Admitting: Oncology

## 2020-05-17 ENCOUNTER — Other Ambulatory Visit: Payer: Commercial Managed Care - PPO

## 2020-05-31 ENCOUNTER — Other Ambulatory Visit: Payer: Self-pay

## 2020-05-31 ENCOUNTER — Inpatient Hospital Stay: Payer: Commercial Managed Care - PPO | Attending: Oncology

## 2020-05-31 ENCOUNTER — Other Ambulatory Visit: Payer: Self-pay | Admitting: *Deleted

## 2020-05-31 DIAGNOSIS — D708 Other neutropenia: Secondary | ICD-10-CM

## 2020-05-31 DIAGNOSIS — D709 Neutropenia, unspecified: Secondary | ICD-10-CM | POA: Insufficient documentation

## 2020-05-31 DIAGNOSIS — Z79899 Other long term (current) drug therapy: Secondary | ICD-10-CM | POA: Diagnosis not present

## 2020-05-31 LAB — CBC WITH DIFFERENTIAL/PLATELET
Abs Immature Granulocytes: 0 10*3/uL (ref 0.00–0.07)
Basophils Absolute: 0.1 10*3/uL (ref 0.0–0.1)
Basophils Relative: 1 %
Eosinophils Absolute: 0.2 10*3/uL (ref 0.0–0.5)
Eosinophils Relative: 4 %
HCT: 39.7 % (ref 36.0–46.0)
Hemoglobin: 13.6 g/dL (ref 12.0–15.0)
Immature Granulocytes: 0 %
Lymphocytes Relative: 43 %
Lymphs Abs: 1.9 10*3/uL (ref 0.7–4.0)
MCH: 30.5 pg (ref 26.0–34.0)
MCHC: 34.3 g/dL (ref 30.0–36.0)
MCV: 89 fL (ref 80.0–100.0)
Monocytes Absolute: 0.7 10*3/uL (ref 0.1–1.0)
Monocytes Relative: 17 %
Neutro Abs: 1.5 10*3/uL — ABNORMAL LOW (ref 1.7–7.7)
Neutrophils Relative %: 35 %
Platelets: 212 10*3/uL (ref 150–400)
RBC: 4.46 MIL/uL (ref 3.87–5.11)
RDW: 12.3 % (ref 11.5–15.5)
WBC: 4.3 10*3/uL (ref 4.0–10.5)
nRBC: 0 % (ref 0.0–0.2)

## 2020-05-31 LAB — COMPREHENSIVE METABOLIC PANEL
ALT: 17 U/L (ref 0–44)
AST: 18 U/L (ref 15–41)
Albumin: 3.8 g/dL (ref 3.5–5.0)
Alkaline Phosphatase: 56 U/L (ref 38–126)
Anion gap: 7 (ref 5–15)
BUN: 12 mg/dL (ref 6–20)
CO2: 27 mmol/L (ref 22–32)
Calcium: 8.5 mg/dL — ABNORMAL LOW (ref 8.9–10.3)
Chloride: 106 mmol/L (ref 98–111)
Creatinine, Ser: 0.8 mg/dL (ref 0.44–1.00)
GFR calc Af Amer: >60 mL/min (ref >60)
GFR calc non Af Amer: >60 mL/min (ref >60)
Glucose, Bld: 88 mg/dL (ref 70–99)
Potassium: 4.1 mmol/L (ref 3.5–5.1)
Sodium: 140 mmol/L (ref 135–145)
Total Bilirubin: 0.6 mg/dL (ref 0.3–1.2)
Total Protein: 7.5 g/dL (ref 6.5–8.1)

## 2020-06-01 ENCOUNTER — Inpatient Hospital Stay (HOSPITAL_BASED_OUTPATIENT_CLINIC_OR_DEPARTMENT_OTHER): Payer: Commercial Managed Care - PPO | Admitting: Oncology

## 2020-06-01 DIAGNOSIS — D708 Other neutropenia: Secondary | ICD-10-CM | POA: Diagnosis not present

## 2020-06-02 ENCOUNTER — Encounter: Payer: Self-pay | Admitting: Oncology

## 2020-06-02 NOTE — Progress Notes (Signed)
I connected with Molly Stone on 06/02/20 at  2:30 PM EDT by video enabled telemedicine visit and verified that I am speaking with the correct person using two identifiers.   I discussed the limitations, risks, security and privacy concerns of performing an evaluation and management service by telemedicine and the availability of in-person appointments. I also discussed with the patient that there may be a patient responsible charge related to this service. The patient expressed understanding and agreed to proceed.  Other persons participating in the visit and their role in the encounter:  none  Patient's location:  home Provider's location:  work  Risk analyst Complaint: Routine follow-up of neutropenia  History of present illness: patient is a 39 year old female with a past medical history significant for spinal stenosis, obstructive sleep apnea referred for leukopenia/neutropenia.She has been feeling poorly for the last 1 month and reports having significant fatigue. Reports occasional chest pain which lasts for a few minutes and then resolves on its own. Denies any fever. Appetite and weight have remained stable.Denies any history of autoimmune disorders. Looking back at her prior CBCs.Patient has had a normal white cell count with a normal differential up until 2019 when she was noted to have leukopenia for the first time in November 2019. On 09/11/2018 her white count was 3.1 with an [place] of 1. Following that her white cell count normalized but was again found to be low in June 2020 when her white count was 3.2 with an ANC of 0.9. ANC improved to 1.3 in September 2020 but since then her [place] has been trending down to 800 in April 2021 and 700 on 03/09/2020. Hemoglobin is always remained stable between 13-14 and platelets have been normal. She had some initial work-up done by Dr. Derrel Nip and was found to have borderline low B12 levels of 287. Copper levels and folate was normal. Iron studies  were within normal limits. She also had HIV testing and hep B and hep C testing which was all negative.  Results of blood work from 03/27/2020 were as follows: CBC showed white count of 2.4, H&H of 14.1/41.7 and a platelet count of 191. ANC was low at 500. Flow cytometry was unable to evaluate B-cell for allergy due to nonspecific light chain binding. ANA comprehensive panel was negative. Pathology smear review showed relative neutropenia. Normal erythrocytes and normal platelets.  Bone marrow biopsy showed normocellular marrow with trilineage hematopoiesis including abundant granulocytic cells and mature neutrophils with nonspecific changes.  No increase in blastic cells.  No evidence of lymphoproliferative disorder.  In the setting peripheral neutropenia may be related to medication immune mediated process flow cytometry on the marrow was normal.   Interval history: Patient reports she has some good days and bad days when she has ongoing fatigue.  Appetite and weight have remained stable   Review of Systems  Constitutional: Positive for malaise/fatigue. Negative for chills, fever and weight loss.  HENT: Negative for congestion, ear discharge and nosebleeds.   Eyes: Negative for blurred vision.  Respiratory: Negative for cough, hemoptysis, sputum production, shortness of breath and wheezing.   Cardiovascular: Negative for chest pain, palpitations, orthopnea and claudication.  Gastrointestinal: Negative for abdominal pain, blood in stool, constipation, diarrhea, heartburn, melena, nausea and vomiting.  Genitourinary: Negative for dysuria, flank pain, frequency, hematuria and urgency.  Musculoskeletal: Negative for back pain, joint pain and myalgias.  Skin: Negative for rash.  Neurological: Negative for dizziness, tingling, focal weakness, seizures, weakness and headaches.  Endo/Heme/Allergies: Does not bruise/bleed easily.  Psychiatric/Behavioral: Negative for depression and suicidal  ideas. The patient does not have insomnia.     Allergies  Allergen Reactions  . Buprenorphine Hcl Itching and Other (See Comments)    And shaking  . Morphine And Related Itching and Other (See Comments)    And shaking  . Prednisone Anxiety and Palpitations  . Tramadol Hcl Other (See Comments)    anxious    Past Medical History:  Diagnosis Date  . Allergy   . Anemia   . Anxiety   . Asthma   . Chlamydia    h/o  . DDD (degenerative disc disease), cervical 04/07/2019  . Disorder of vocal cord    spasmodic dysphonia  . Dysmenorrhea   . Dyspareunia, female   . Endometriosis   . Family history of endometriosis   . GERD (gastroesophageal reflux disease)   . Headache    migraine  . Heavy periods   . Hiatal hernia   . History of nephrolithiasis   . Hypertension   . Jaundice, physiologic, newborn   . Obesity (BMI 30-39.9)   . Sleep apnea   . Tobacco user   . Vaginal Pap smear, abnormal     Past Surgical History:  Procedure Laterality Date  . DILATATION & CURETTAGE/HYSTEROSCOPY WITH MYOSURE N/A 08/27/2018   Procedure: DILATATION & CURETTAGE/HYSTEROSCOPY WITH MINERVA;  Surgeon: Gae Dry, MD;  Location: [facility] ORS;  Service: Gynecology;  Laterality: N/A;  . DILATION AND CURETTAGE OF UTERUS    . ESOPHAGOGASTRODUODENOSCOPY (EGD) WITH PROPOFOL N/A 08/07/2018   Procedure: ESOPHAGOGASTRODUODENOSCOPY (EGD) WITH PROPOFOL;  Surgeon: Jonathon Bellows, MD;  Location: [facility] ENDOSCOPY;  Service: Gastroenterology;  Laterality: N/A;  . laparoscopy    . ROOT CANAL    . WISDOM TOOTH EXTRACTION      Social History   Socioeconomic History  . Marital status: Married    Spouse name: Not on file  . Number of children: Not on file  . Years of education: Not on file  . Highest education level: Not on file  Occupational History  . Not on file  Tobacco Use  . Smoking status: Former Smoker    Years: 10.00    Types: Cigarettes    Quit date: 10/2016    Years since quitting: 3.6  .  Smokeless tobacco: Never Used  Vaping Use  . Vaping Use: Former  Substance and Sexual Activity  . Alcohol use: Not Currently    Alcohol/week: 0.0 standard drinks  . Drug use: No  . Sexual activity: Yes    Birth control/protection: None    Comment: vasectomy  Other Topics Concern  . Not on file  Social History Narrative  . Not on file   Social Determinants of Health   Financial Resource Strain:   . Difficulty of Paying Living Expenses:   Food Insecurity:   . Worried About Charity fundraiser in the Last Year:   . Arboriculturist in the Last Year:   Transportation Needs:   . Film/video editor (Medical):   Marland Kitchen Lack of Transportation (Non-Medical):   Physical Activity:   . Days of Exercise per Week:   . Minutes of Exercise per Session:   Stress:   . Feeling of Stress :   Social Connections:   . Frequency of Communication with Friends and Family:   . Frequency of Social Gatherings with Friends and Family:   . Attends Religious Services:   . Active Member of Clubs or Organizations:   . Attends Club  or Organization Meetings:   Marland Kitchen Marital Status:   Intimate Partner Violence:   . Fear of Current or Ex-Partner:   . Emotionally Abused:   Marland Kitchen Physically Abused:   . Sexually Abused:     Family History  Problem Relation Age of Onset  . Endometriosis Mother   . Cancer Father        benign liver CA  . Stroke Father        recurrent blood clots on the brain   . Lupus Father   . Cancer Maternal Grandmother 30       ovarian Ca, still surviving   . Osteoporosis Maternal Grandmother   . Ovarian cancer Maternal Grandmother   . Heart disease Maternal Grandfather   . Cancer Maternal Grandfather 77       colon CA. metastatic  . Diabetes Maternal Uncle   . Heart disease Paternal Grandmother   . CAD Brother 48       early at age 81  . Breast cancer Neg Hx   . Colon cancer Neg Hx   . Kidney cancer Neg Hx   . Bladder Cancer Neg Hx      Current Outpatient Medications:  .   ADVAIR HFA 300-51 MCG/ACT inhaler, USE 2 INHALATIONS TWICE A DAY, Disp: 36 g, Rfl: 3 .  albuterol (PROAIR HFA) 108 (90 Base) MCG/ACT inhaler, Inhale 1-2 puffs into the lungs every 6 (six) hours as needed for wheezing or shortness of breath., Disp: 8 g, Rfl: 2 .  Bacillus Coagulans-Inulin (PROBIOTIC-PREBIOTIC PO), Take by mouth., Disp: , Rfl:  .  Cholecalciferol (VITAMIN D3) 125 MCG (5000 UT) TABS, Take 1 tablet by mouth daily. , Disp: , Rfl:  .  EPINEPHrine 0.3 mg/0.3 mL IJ SOAJ injection, See admin instructions. for allergic reaction, Disp: , Rfl:  .  famotidine (PEPCID) 20 MG tablet, Take 20 mg by mouth 2 (two) times daily., Disp: , Rfl:  .  ibuprofen (ADVIL) 800 MG tablet, Take 800 mg by mouth every 8 (eight) hours as needed., Disp: , Rfl:  .  montelukast (SINGULAIR) 10 MG tablet, TAKE 1 TABLET AT BEDTIME, Disp: 90 tablet, Rfl: 3 .  Multiple Vitamin (MULTIVITAMIN) tablet, Take 1 tablet by mouth daily., Disp: , Rfl:  .  nebivolol (BYSTOLIC) 5 MG tablet, Take 1 tablet (5 mg total) by mouth daily., Disp: 90 tablet, Rfl: 3 .  pantoprazole (PROTONIX) 40 MG tablet, TAKE 1 TABLET TWICE A DAY, Disp: 180 tablet, Rfl: 3 .  vitamin B-12 (CYANOCOBALAMIN) 1000 MCG tablet, Take 1,000 mcg by mouth daily., Disp: , Rfl:  .  amoxicillin-clavulanate (AUGMENTIN) 875-125 MG tablet, Take 1 tablet by mouth 2 (two) times daily., Disp: 14 tablet, Rfl: 0 .  predniSONE (DELTASONE) 20 MG tablet, Take 1 tablet (20 mg total) by mouth daily with breakfast., Disp: 5 tablet, Rfl: 0  No results found.  No images are attached to the encounter.   CMP Latest Ref Rng & Units 05/31/2020  Glucose 70 - 99 mg/dL 88  BUN 6 - 20 mg/dL 12  Creatinine 0.44 - 1.00 mg/dL 0.80  Sodium 135 - 145 mmol/L 140  Potassium 3.5 - 5.1 mmol/L 4.1  Chloride 98 - 111 mmol/L 106  CO2 22 - 32 mmol/L 27  Calcium 8.9 - 10.3 mg/dL 8.5(L)  Total Protein 6.5 - 8.1 g/dL 7.5  Total Bilirubin 0.3 - 1.2 mg/dL 0.6  Alkaline Phos 38 - 126 U/L 56  AST 15 -  41 U/L 18  ALT 0 -  44 U/L 17   CBC Latest Ref Rng & Units 05/31/2020  WBC 4.0 - 10.5 K/uL 4.3  Hemoglobin 12.0 - 15.0 g/dL 13.6  Hematocrit 36 - 46 % 39.7  Platelets 150 - 400 K/uL 212     Observation/objective: Appears in no acute distress over video visit today.  Breathing is nonlabored  Assessment and plan: Patient is a 40 year old female with neutropenia possibly autoimmune versus viral and this is a routine follow-up visit  Patient's white count was down to 2.7 with an [place] of 0.8 back in April 2021 and presently her white count has normalized to 4.3.  In July 2021 her [place] had improved from 0.5-3.2 and it is down to 1.5 again today.  Hemoglobin and platelets are normal.  Bone marrow biopsy did not reveal any acute pathology.  Suspect that her waxing and waning neutropenia may have an autoimmune etiology.  At this time I am inclined to monitor this conservatively without giving her a trial of steroids.   Follow-up instructions: Check CBC with differential in 6 weeks and 12 weeks followed by video visit in 12 weeks  I discussed the assessment and treatment plan with the patient. The patient was provided an opportunity to ask questions and all were answered. The patient agreed with the plan and demonstrated an understanding of the instructions.   The patient was advised to call back or seek an in-person evaluation if the symptoms worsen or if the condition fails to improve as anticipated.  Visit Diagnosis: 1. Other neutropenia ([place])     Dr. Randa Evens, MD, MPH [facility] Tel- [phone] 06/02/2020 8:50 AM

## 2020-06-05 ENCOUNTER — Other Ambulatory Visit: Payer: Self-pay

## 2020-06-05 ENCOUNTER — Ambulatory Visit
Admission: RE | Admit: 2020-06-05 | Discharge: 2020-06-05 | Disposition: A | Discharge status: Home / Self Care | Payer: Commercial Managed Care - PPO | Source: Ambulatory Visit | Attending: Internal Medicine | Admitting: Internal Medicine

## 2020-06-05 DIAGNOSIS — Z1231 Encounter for screening mammogram for malignant neoplasm of breast: Secondary | ICD-10-CM

## 2020-07-07 ENCOUNTER — Telehealth: Payer: Self-pay | Admitting: *Deleted

## 2020-07-07 ENCOUNTER — Telehealth: Payer: Self-pay

## 2020-07-07 DIAGNOSIS — R531 Weakness: Secondary | ICD-10-CM

## 2020-07-07 DIAGNOSIS — D708 Other neutropenia: Secondary | ICD-10-CM

## 2020-07-07 NOTE — Telephone Encounter (Signed)
Pt calling; for the last few months her periods lasts 2d instead of 7.  [phone]  Pt states she has leukopenia and neutropenia.  Doesn't know if that has anything to do with it or not.  Has had a bone marrow bx which she hasn't heard from yet.  Does NOT have leukemia.  Pt has taken bc at the same time every day.  Adv nothing much has built up so there isn't much to get rid of.  May get to where she doesn't have a period at all so as long as is taking pills not to worry about it.

## 2020-07-07 NOTE — Telephone Encounter (Signed)
She can come for labs on Monday cbc with diff, ferritin and iron studies, b12 and we can decide based on that

## 2020-07-07 NOTE — Telephone Encounter (Signed)
Appointment changed to 9/20 per patient at 1015

## 2020-07-07 NOTE — Telephone Encounter (Signed)
Patient called reporting that she is feeling very weak and tired. Her next appointment is for lab only is on 07/13/20 and she sees you in November. Please advise

## 2020-07-10 ENCOUNTER — Other Ambulatory Visit: Payer: Self-pay

## 2020-07-10 ENCOUNTER — Inpatient Hospital Stay: Payer: Commercial Managed Care - PPO | Attending: Oncology

## 2020-07-10 DIAGNOSIS — J45909 Unspecified asthma, uncomplicated: Secondary | ICD-10-CM | POA: Insufficient documentation

## 2020-07-10 DIAGNOSIS — Z87891 Personal history of nicotine dependence: Secondary | ICD-10-CM | POA: Diagnosis not present

## 2020-07-10 DIAGNOSIS — E669 Obesity, unspecified: Secondary | ICD-10-CM | POA: Insufficient documentation

## 2020-07-10 DIAGNOSIS — K219 Gastro-esophageal reflux disease without esophagitis: Secondary | ICD-10-CM | POA: Insufficient documentation

## 2020-07-10 DIAGNOSIS — D709 Neutropenia, unspecified: Secondary | ICD-10-CM | POA: Diagnosis not present

## 2020-07-10 DIAGNOSIS — G4733 Obstructive sleep apnea (adult) (pediatric): Secondary | ICD-10-CM | POA: Diagnosis not present

## 2020-07-10 DIAGNOSIS — I10 Essential (primary) hypertension: Secondary | ICD-10-CM | POA: Diagnosis not present

## 2020-07-10 DIAGNOSIS — D708 Other neutropenia: Secondary | ICD-10-CM

## 2020-07-10 DIAGNOSIS — Z87442 Personal history of urinary calculi: Secondary | ICD-10-CM | POA: Diagnosis not present

## 2020-07-10 DIAGNOSIS — Z79899 Other long term (current) drug therapy: Secondary | ICD-10-CM | POA: Insufficient documentation

## 2020-07-10 DIAGNOSIS — R5383 Other fatigue: Secondary | ICD-10-CM | POA: Diagnosis not present

## 2020-07-10 DIAGNOSIS — R531 Weakness: Secondary | ICD-10-CM

## 2020-07-10 LAB — CBC WITH DIFFERENTIAL/PLATELET
Abs Immature Granulocytes: 0 10*3/uL (ref 0.00–0.07)
Basophils Absolute: 0 10*3/uL (ref 0.0–0.1)
Basophils Relative: 1 %
Eosinophils Absolute: 0.3 10*3/uL (ref 0.0–0.5)
Eosinophils Relative: 7 %
HCT: 40.6 % (ref 36.0–46.0)
Hemoglobin: 14.1 g/dL (ref 12.0–15.0)
Immature Granulocytes: 0 %
Lymphocytes Relative: 48 %
Lymphs Abs: 1.9 10*3/uL (ref 0.7–4.0)
MCH: 30.5 pg (ref 26.0–34.0)
MCHC: 34.7 g/dL (ref 30.0–36.0)
MCV: 87.7 fL (ref 80.0–100.0)
Monocytes Absolute: 0.6 10*3/uL (ref 0.1–1.0)
Monocytes Relative: 16 %
Neutro Abs: 1 10*3/uL — ABNORMAL LOW (ref 1.7–7.7)
Neutrophils Relative %: 28 %
Platelets: 183 10*3/uL (ref 150–400)
RBC: 4.63 MIL/uL (ref 3.87–5.11)
RDW: 12.5 % (ref 11.5–15.5)
WBC: 3.8 10*3/uL — ABNORMAL LOW (ref 4.0–10.5)
nRBC: 0 % (ref 0.0–0.2)

## 2020-07-10 LAB — VITAMIN B12: Vitamin B-12: 845 pg/mL (ref 180–914)

## 2020-07-10 LAB — IRON AND TIBC
Iron: 74 ug/dL (ref 28–170)
Saturation Ratios: 24 % (ref 10.4–31.8)
TIBC: 309 ug/dL (ref 250–450)
UIBC: 235 ug/dL

## 2020-07-10 LAB — FERRITIN: Ferritin: 37 ng/mL (ref 11–307)

## 2020-07-13 ENCOUNTER — Other Ambulatory Visit: Payer: Commercial Managed Care - PPO

## 2020-07-14 ENCOUNTER — Inpatient Hospital Stay (HOSPITAL_BASED_OUTPATIENT_CLINIC_OR_DEPARTMENT_OTHER): Payer: Commercial Managed Care - PPO | Admitting: Oncology

## 2020-07-14 ENCOUNTER — Encounter: Payer: Self-pay | Admitting: Oncology

## 2020-07-14 DIAGNOSIS — D708 Other neutropenia: Secondary | ICD-10-CM | POA: Diagnosis not present

## 2020-07-14 NOTE — Progress Notes (Signed)
°  Patient states that she is sensitive to heat. When she goes out she gets really weak, can't breathe and start sweating. Patient also stated that for the last couple months her cycle has lasted only for 2 days.

## 2020-07-16 IMAGING — CR DG CHEST 2V
2 series · 2 of 2 positions shown · non-contrast
Comparison: Chest radiograph September 11, 2018

CLINICAL DATA: Tachycardia, shortness of breath and vomiting.
History of asthma.

EXAM:
CHEST - 2 VIEW

[chest pa]
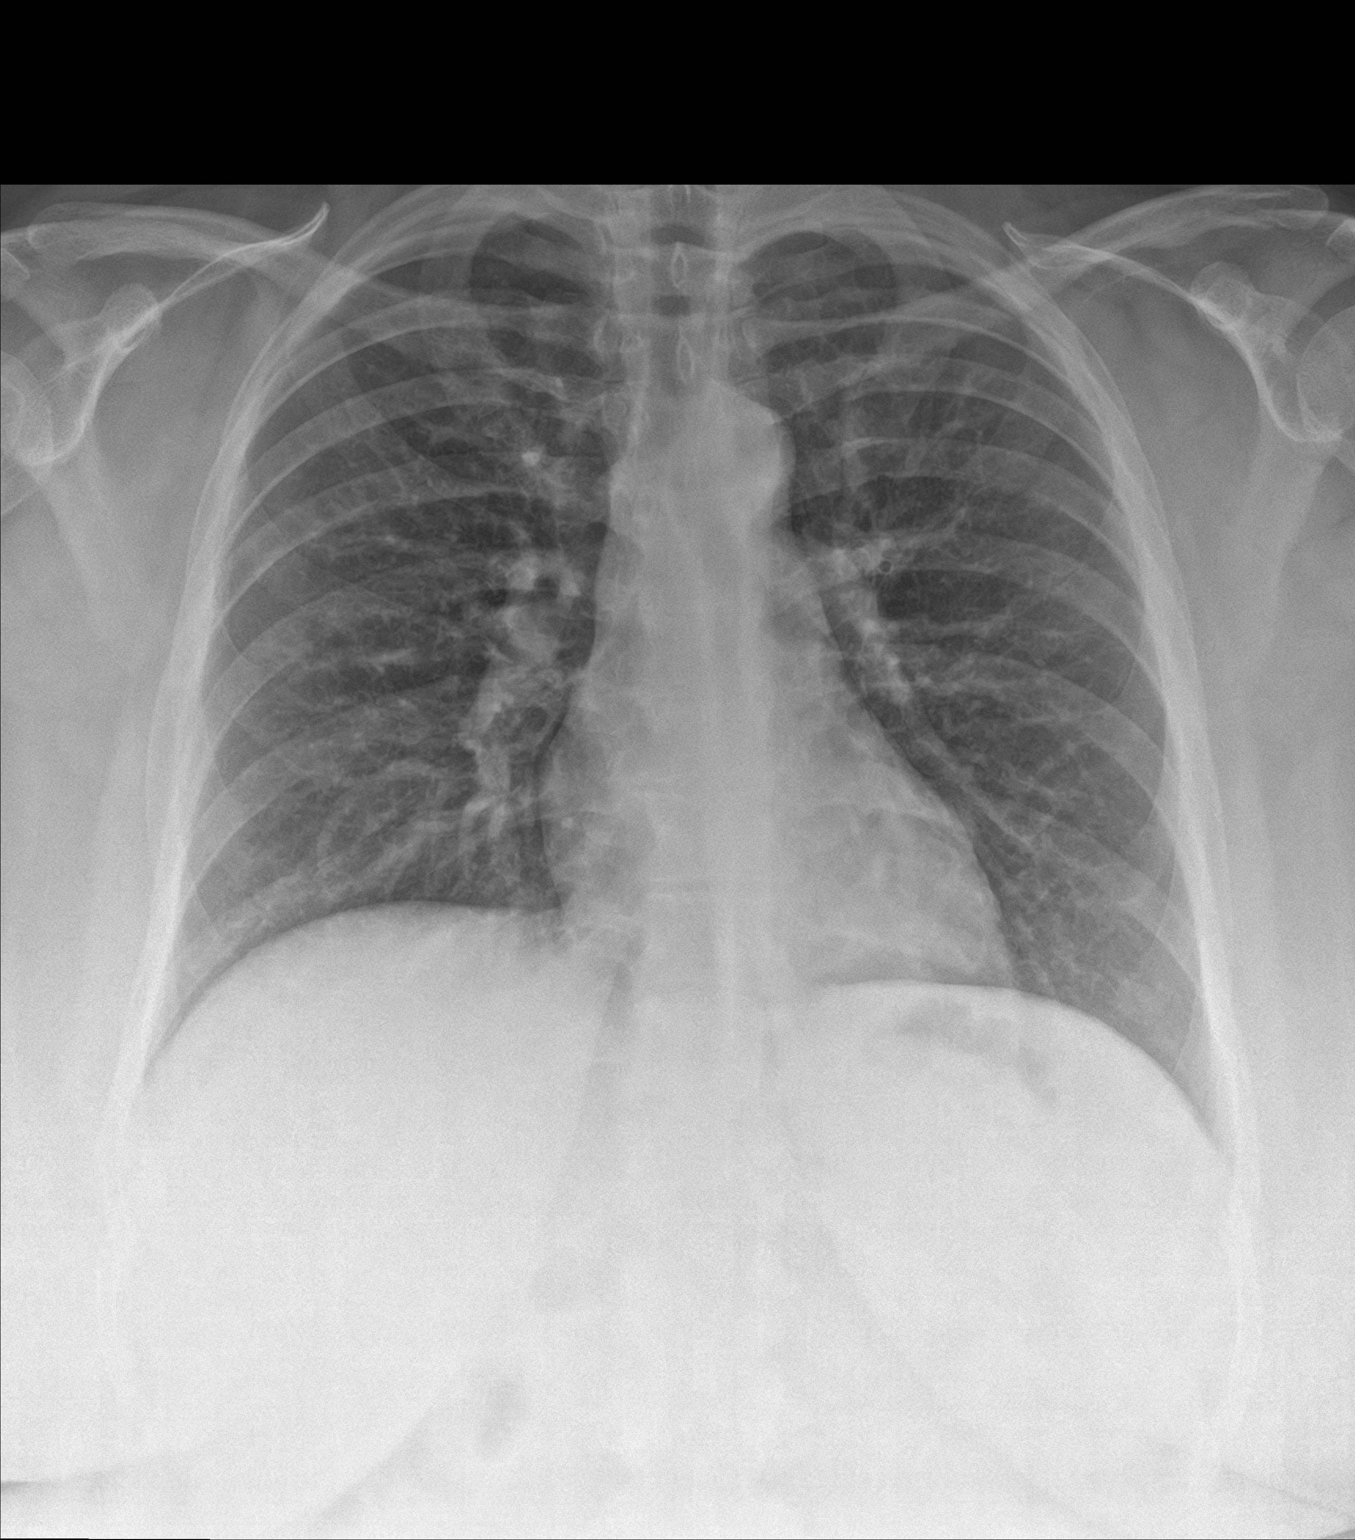

[chest lat]
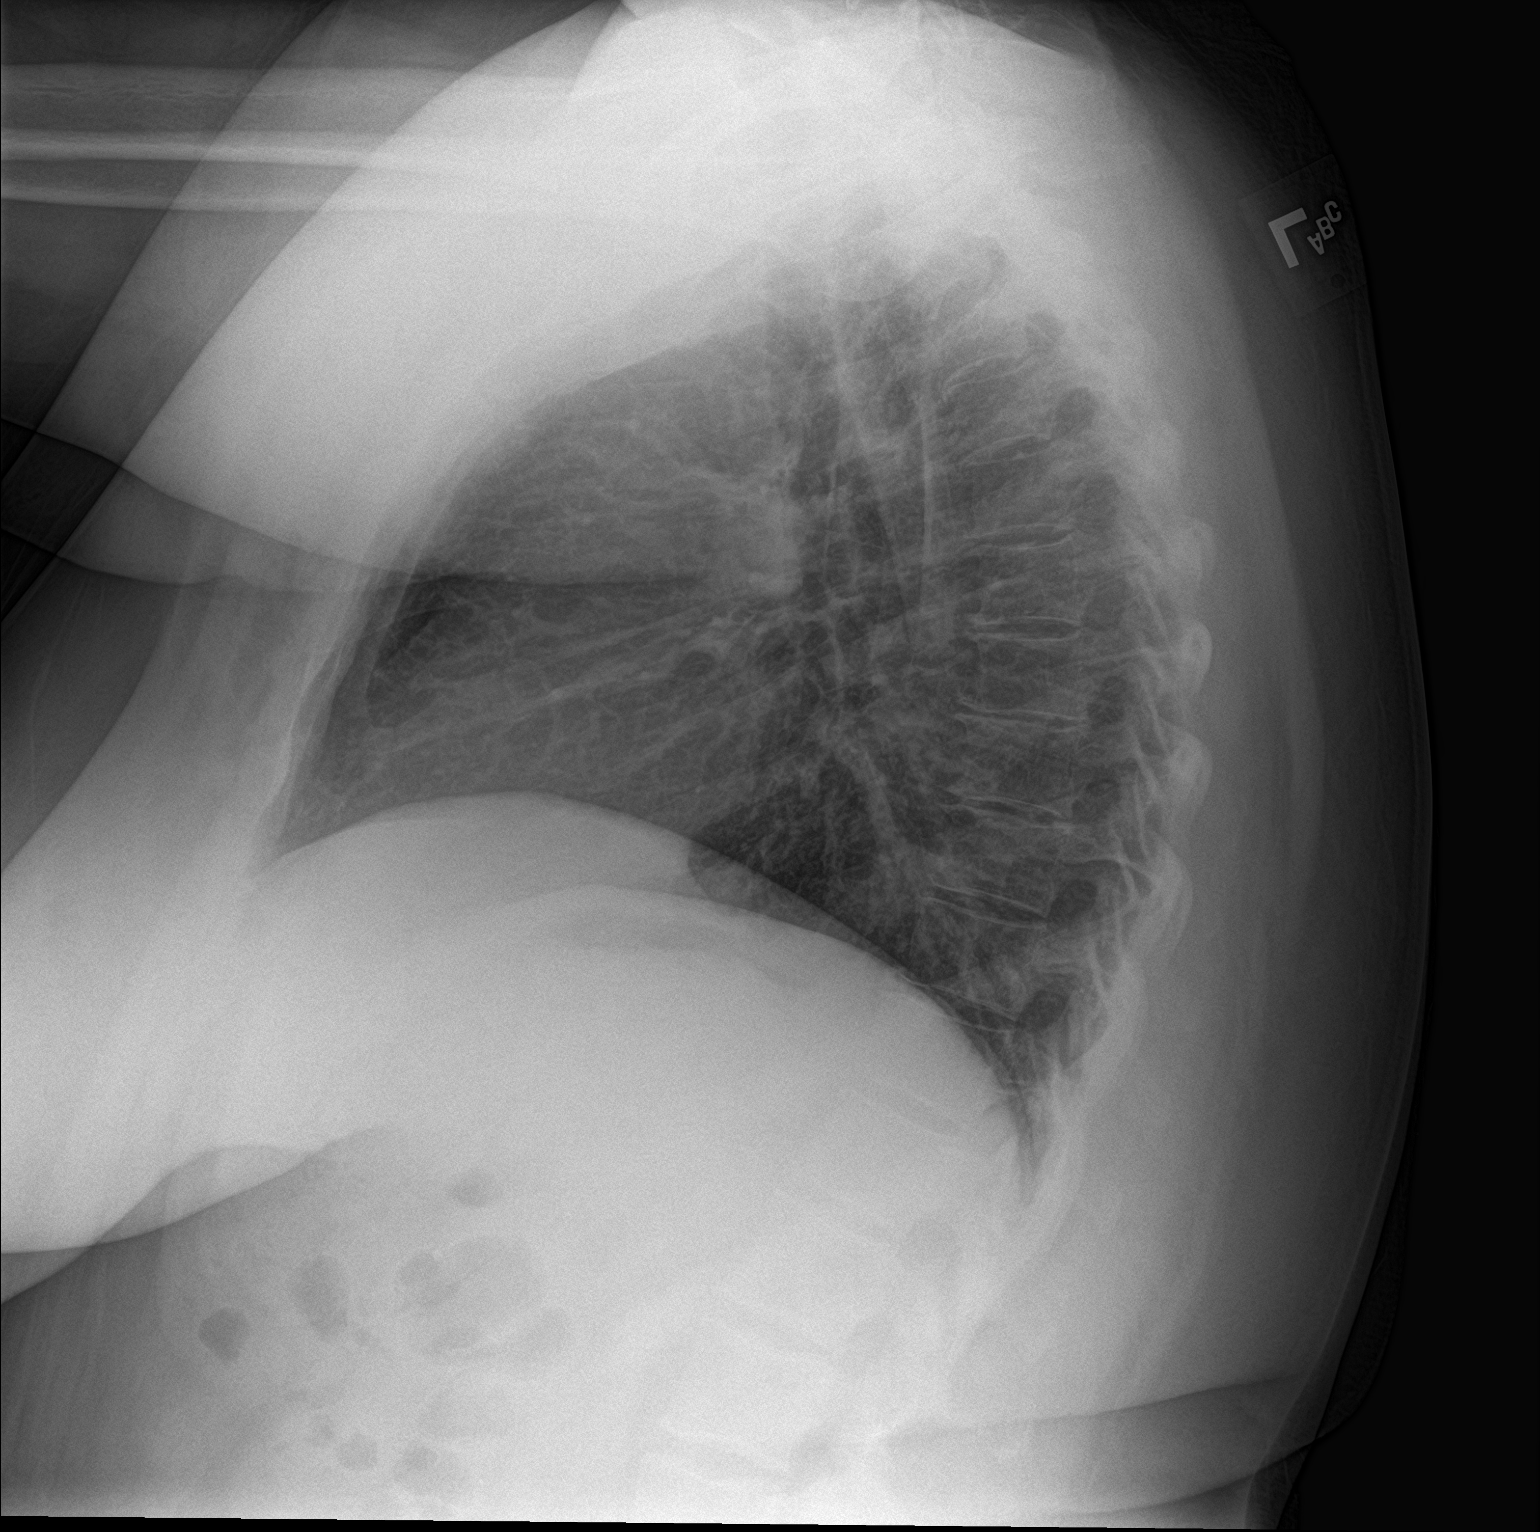

[2 of 2 positions shown; findings below may reference images not displayed]

FINDINGS: Cardiomediastinal silhouette is normal. No pleural effusions or
focal consolidations. Trachea projects midline and there is no
pneumothorax. Soft tissue planes and included osseous structures are
non-suspicious. Large body habitus. Mild thoracic spondylosis.
IMPRESSION: No active cardiopulmonary process.

## 2020-07-17 NOTE — Progress Notes (Signed)
I connected with Molly Stone on 07/17/20 at  2:15 PM EDT by video enabled telemedicine visit and verified that I am speaking with the correct person using two identifiers.   I discussed the limitations, risks, security and privacy concerns of performing an evaluation and management service by telemedicine and the availability of in-person appointments. I also discussed with the patient that there may be a patient responsible charge related to this service. The patient expressed understanding and agreed to proceed.  Other persons participating in the visit and their role in the encounter:  none  Patient's location:  home Provider's location:  work  Risk analyst Complaint: Routine follow-up of neutropenia  History of present illness: patient is a 39 year old female with a past medical history significant for spinal stenosis, obstructive sleep apnea referred for leukopenia/neutropenia.She has been feeling poorly for the last 1 month and reports having significant fatigue. Reports occasional chest pain which lasts for a few minutes and then resolves on its own. Denies any fever. Appetite and weight have remained stable.Denies any history of autoimmune disorders. Looking back at her prior CBCs.Patient has had a normal white cell count with a normal differential up until 2019 when she was noted to have leukopenia for the first time in November 2019. On 09/11/2018 her white count was 3.1 with an [place] of 1. Following that her white cell count normalized but was again found to be low in June 2020 when her white count was 3.2 with an ANC of 0.9. ANC improved to 1.3 in September 2020 but since then her [place] has been trending down to 800 in April 2021 and 700 on 03/09/2020. Hemoglobin is always remained stable between 13-14 and platelets have been normal. She had some initial work-up done by Dr. Derrel Nip and was found to have borderline low B12 levels of 287. Copper levels and folate was normal. Iron studies  were within normal limits. She also had HIV testing and hep B and hep C testing which was all negative.  Results of blood work from 03/27/2020 were as follows: CBC showed white count of 2.4, H&H of 14.1/41.7 and a platelet count of 191. ANC was low at 500. Flow cytometry was unable to evaluate B-cell for allergy due to nonspecific light chain binding. ANA comprehensive panel was negative. Pathology smear review showed relative neutropenia. Normal erythrocytes and normal platelets.  Bone marrow biopsy showed normocellular marrow with trilineage hematopoiesis including abundant granulocytic cells and mature neutrophils with nonspecific changes. No increase in blastic cells. No evidence of lymphoproliferative disorder. In the setting peripheral neutropenia may be related to medication immune mediated process flow cytometry on the marrow was normal.   Interval history: Patient states that she has some good days and bad days when her fatigue returns and after about 2 days of having good energy she is running out of energy and feels extremely fatigued.  Denies any recurrent infections.   Review of Systems  Constitutional: Positive for malaise/fatigue. Negative for chills, fever and weight loss.  HENT: Negative for congestion, ear discharge and nosebleeds.   Eyes: Negative for blurred vision.  Respiratory: Negative for cough, hemoptysis, sputum production, shortness of breath and wheezing.   Cardiovascular: Negative for chest pain, palpitations, orthopnea and claudication.  Gastrointestinal: Negative for abdominal pain, blood in stool, constipation, diarrhea, heartburn, melena, nausea and vomiting.  Genitourinary: Negative for dysuria, flank pain, frequency, hematuria and urgency.  Musculoskeletal: Negative for back pain, joint pain and myalgias.  Skin: Negative for rash.  Neurological: Negative for dizziness,  tingling, focal weakness, seizures, weakness and headaches.  Endo/Heme/Allergies:  Does not bruise/bleed easily.  Psychiatric/Behavioral: Negative for depression and suicidal ideas. The patient does not have insomnia.     Allergies  Allergen Reactions   Buprenorphine Hcl Itching and Other (See Comments)    And shaking   Morphine And Related Itching and Other (See Comments)    And shaking   Prednisone Anxiety and Palpitations   Tramadol Hcl Other (See Comments)    anxious    Past Medical History:  Diagnosis Date   Allergy    Anemia    Anxiety    Asthma    Chlamydia    h/o   DDD (degenerative disc disease), cervical 04/07/2019   Disorder of vocal cord    spasmodic dysphonia   Dysmenorrhea    Dyspareunia, female    Endometriosis    Family history of endometriosis    GERD (gastroesophageal reflux disease)    Headache    migraine   Heavy periods    Hiatal hernia    History of nephrolithiasis    Hypertension    Jaundice, physiologic, newborn    Obesity (BMI 30-39.9)    Sleep apnea    Tobacco user    Vaginal Pap smear, abnormal     Past Surgical History:  Procedure Laterality Date   DILATATION & CURETTAGE/HYSTEROSCOPY WITH MYOSURE N/A 08/27/2018   Procedure: DILATATION & CURETTAGE/HYSTEROSCOPY WITH MINERVA;  Surgeon: Gae Dry, MD;  Location: [facility] ORS;  Service: Gynecology;  Laterality: N/A;   DILATION AND CURETTAGE OF UTERUS     ESOPHAGOGASTRODUODENOSCOPY (EGD) WITH PROPOFOL N/A 08/07/2018   Procedure: ESOPHAGOGASTRODUODENOSCOPY (EGD) WITH PROPOFOL;  Surgeon: Jonathon Bellows, MD;  Location: [facility] ENDOSCOPY;  Service: Gastroenterology;  Laterality: N/A;   laparoscopy     ROOT CANAL     WISDOM TOOTH EXTRACTION      Social History   Socioeconomic History   Marital status: Married    Spouse name: Not on file   Number of children: Not on file   Years of education: Not on file   Highest education level: Not on file  Occupational History   Not on file  Tobacco Use   Smoking status: Former Smoker    Years:  10.00    Types: Cigarettes    Quit date: 10/2016    Years since quitting: 3.7   Smokeless tobacco: Never Used  Vaping Use   Vaping Use: Former  Substance and Sexual Activity   Alcohol use: Not Currently    Alcohol/week: 0.0 standard drinks   Drug use: No   Sexual activity: Yes    Birth control/protection: None    Comment: vasectomy  Other Topics Concern   Not on file  Social History Narrative   Not on file   Social Determinants of Health   Financial Resource Strain:    Difficulty of Paying Living Expenses: Not on file  Food Insecurity:    Worried About Charity fundraiser in the Last Year: Not on file   [employer] of Food in the Last Year: Not on file  Transportation Needs:    Lack of Transportation (Medical): Not on file   Lack of Transportation (Non-Medical): Not on file  Physical Activity:    Days of Exercise per Week: Not on file   Minutes of Exercise per Session: Not on file  Stress:    Feeling of Stress : Not on file  Social Connections:    Frequency of Communication with Friends and Family: Not  on file   Frequency of Social Gatherings with Friends and Family: Not on file   Attends Religious Services: Not on file   Active Member of Clubs or Organizations: Not on file   Attends Archivist Meetings: Not on file   Marital Status: Not on file  Intimate Partner Violence:    Fear of Current or Ex-Partner: Not on file   Emotionally Abused: Not on file   Physically Abused: Not on file   Sexually Abused: Not on file    Family History  Problem Relation Age of Onset   Endometriosis Mother    Cancer Father        benign liver CA   Stroke Father        recurrent blood clots on the brain    Lupus Father    Cancer Maternal Grandmother 63       ovarian Ca, still surviving    Osteoporosis Maternal Grandmother    Ovarian cancer Maternal Grandmother    Heart disease Maternal Grandfather    Cancer Maternal Grandfather 71        colon CA. metastatic   Diabetes Maternal Uncle    Heart disease Paternal Grandmother    CAD Brother 41       early at age 16   Breast cancer Cousin    Colon cancer Neg Hx    Kidney cancer Neg Hx    Bladder Cancer Neg Hx      Current Outpatient Medications:    ADVAIR HFA 230-21 MCG/ACT inhaler, USE 2 INHALATIONS TWICE A DAY, Disp: 36 g, Rfl: 3   albuterol (PROAIR HFA) 108 (90 Base) MCG/ACT inhaler, Inhale 1-2 puffs into the lungs every 6 (six) hours as needed for wheezing or shortness of breath., Disp: 8 g, Rfl: 2   Bacillus Coagulans-Inulin (PROBIOTIC-PREBIOTIC PO), Take by mouth., Disp: , Rfl:    Cholecalciferol (VITAMIN D3) 125 MCG (5000 UT) TABS, Take 1 tablet by mouth daily. , Disp: , Rfl:    EPINEPHrine 0.3 mg/0.3 mL IJ SOAJ injection, See admin instructions. for allergic reaction, Disp: , Rfl:    famotidine (PEPCID) 20 MG tablet, Take 20 mg by mouth 2 (two) times daily., Disp: , Rfl:    ibuprofen (ADVIL) 800 MG tablet, Take 800 mg by mouth every 8 (eight) hours as needed., Disp: , Rfl:    montelukast (SINGULAIR) 10 MG tablet, TAKE 1 TABLET AT BEDTIME, Disp: 90 tablet, Rfl: 3   Multiple Vitamin (MULTIVITAMIN) tablet, Take 1 tablet by mouth daily., Disp: , Rfl:    nebivolol (BYSTOLIC) 5 MG tablet, Take 1 tablet (5 mg total) by mouth daily., Disp: 90 tablet, Rfl: 3   pantoprazole (PROTONIX) 40 MG tablet, TAKE 1 TABLET TWICE A DAY, Disp: 180 tablet, Rfl: 3   vitamin B-12 (CYANOCOBALAMIN) 1000 MCG tablet, Take 1,000 mcg by mouth daily., Disp: , Rfl:    vitamin k 100 MCG tablet, Take 100 mcg by mouth daily., Disp: , Rfl:    amoxicillin-clavulanate (AUGMENTIN) 875-125 MG tablet, Take 1 tablet by mouth 2 (two) times daily., Disp: 14 tablet, Rfl: 0   predniSONE (DELTASONE) 20 MG tablet, Take 1 tablet (20 mg total) by mouth daily with breakfast., Disp: 5 tablet, Rfl: 0  No results found.  No images are attached to the encounter.   CMP Latest Ref Rng & Units  05/31/2020  Glucose 70 - 99 mg/dL 88  BUN 6 - 20 mg/dL 12  Creatinine 0.44 - 1.00 mg/dL 0.80  Sodium 135 -  145 mmol/L 140  Potassium 3.5 - 5.1 mmol/L 4.1  Chloride 98 - 111 mmol/L 106  CO2 22 - 32 mmol/L 27  Calcium 8.9 - 10.3 mg/dL 8.5(L)  Total Protein 6.5 - 8.1 g/dL 7.5  Total Bilirubin 0.3 - 1.2 mg/dL 0.6  Alkaline Phos 38 - 126 U/L 56  AST 15 - 41 U/L 18  ALT 0 - 44 U/L 17   CBC Latest Ref Rng & Units 07/10/2020  WBC 4.0 - 10.5 K/uL 3.8(L)  Hemoglobin 12.0 - 15.0 g/dL 14.1  Hematocrit 36 - 46 % 40.6  Platelets 150 - 400 K/uL 183     Observation/objective: Appears in no acute distress over video visit today.  Breathing is nonlabored  Assessment and plan: Patient is a 39 year old female with Idiopathic neutropenia and this is a routine follow-up visit  Patient had a normal hemoglobin up until August 2019 and since then her CBC shows leukopenia which is isolated in the absence of other cytopenias.  Her ANC has been fluctuating between 0.5-1.5 over the last 2 years.  Patient denies any new medical diagnoses or medications.  She even underwent a bone marrow biopsy which was unremarkable and showed normal myeloid precursors.  She is not having any recurrent infections or hospitalizations.  It would be difficult to attribute her fatigue entirely to neutropenia.  Treatments with G-CSF or steroids would be recommended if there is a concern for recurrent infections and the need to increase the neutrophil count back to normal.  However these are not long-term solutions.  Her autoimmune work-up was also negative.  I am inclined to monitor this conservatively without giving her steroids or G-CSF at this time.  I will also refer her to [facility] for 2nd opinion For her neutropenia.  Patient understands that this could take a few months before she could see someone over there  Follow-up instructions: CBC with differential in 6 weeks and 12 weeks and I will see her in 12 weeks and also check a TSH again in  12 weeks  I discussed the assessment and treatment plan with the patient. The patient was provided an opportunity to ask questions and all were answered. The patient agreed with the plan and demonstrated an understanding of the instructions.   The patient was advised to call back or seek an in-person evaluation if the symptoms worsen or if the condition fails to improve as anticipated.   Visit Diagnosis: 1. Other neutropenia ([place])     Dr. Randa Evens, MD, MPH [facility] at [facility] Tel- [phone] 07/17/2020 3:56 PM

## 2020-07-24 ENCOUNTER — Telehealth: Payer: Self-pay | Admitting: Internal Medicine

## 2020-07-24 ENCOUNTER — Ambulatory Visit: Payer: Commercial Managed Care - PPO

## 2020-07-24 NOTE — Telephone Encounter (Signed)
Patient called back and said she was going today, 07/24/20, to get her flu shot at 3:00. She wants to know if this is ok, since she did react to the Covid vaccine. She also wants to know if there is anything she can take to prevent a reaction.

## 2020-07-24 NOTE — Telephone Encounter (Signed)
Called and spoke to patient and she has been scheduled to see Dr. Maudie Mercury in Nov. 17th.

## 2020-07-24 NOTE — Telephone Encounter (Signed)
Patient needs visit to further evaluate. Thank you.

## 2020-07-24 NOTE — Telephone Encounter (Signed)
Please advise 

## 2020-07-24 NOTE — Telephone Encounter (Signed)
Patient called in stated that the [place] alleger and asthma before she get any vaccine she wanted to know if she should get her flu shot

## 2020-07-24 NOTE — Telephone Encounter (Signed)
Called and spoke to patient and she has been advised to talk to PCP regarding the flu shot. Patient expressed understanding and would like to know if she should get the booster vaccine.

## 2020-07-24 NOTE — Telephone Encounter (Signed)
Patient is actually not our clinic patient. I only answered questions about her covid vaccine reaction back in the spring.  Regarding the flu shot - please have her follow up with her PCP.  Thank you.

## 2020-07-25 NOTE — Telephone Encounter (Signed)
I would not get the flu shot until she can discuss with her allergist

## 2020-07-25 NOTE — Telephone Encounter (Signed)
Spoke with pt to let her know that Dr. Derrel Nip does not want her to get the flu shot until she talks to her allergist.

## 2020-07-25 NOTE — Telephone Encounter (Signed)
Spoke with pt and she is wanting to know if you thought she should get the flu shot this year. Pt has recently been diagnosed with a bunch of new allergies and has to use an EpiPen. She is scheduled with [facility] but not until November. Pt stated that she never had a reaction to the flu shot until last year her throat got tight, but she stated she took a benadryl and she was fine. Then when she got the first covid vaccine her throat got tight, she took a benadryl and was fine. With the second one she had more of a reaction and it occurred within minutes of getting the vaccine. With the 2nd shot she stated that her throat got tight, she felt "like all the water got suck out of me", extremely hot and her whole body turned red. She stated the nurse at the vaccine clinic had her take a benadryl and then several minutes later take another one and after watching her for 45 minutes they let her go because they thought she would be fine.

## 2020-07-28 ENCOUNTER — Telehealth: Payer: Self-pay | Admitting: *Deleted

## 2020-07-28 NOTE — Telephone Encounter (Signed)
Wanted to check and see if pt got an appt with [facility] for neutropenia. shantel says that her appt is 10/24/2020 at 8 am and pt has been notified of appt

## 2020-08-09 ENCOUNTER — Other Ambulatory Visit: Payer: Commercial Managed Care - PPO

## 2020-08-09 DIAGNOSIS — Z20822 Contact with and (suspected) exposure to covid-19: Secondary | ICD-10-CM

## 2020-08-10 ENCOUNTER — Ambulatory Visit: Payer: Commercial Managed Care - PPO | Admitting: Obstetrics & Gynecology

## 2020-08-11 ENCOUNTER — Telehealth: Payer: Commercial Managed Care - PPO | Admitting: Internal Medicine

## 2020-08-11 ENCOUNTER — Encounter: Payer: Self-pay | Admitting: Internal Medicine

## 2020-08-11 DIAGNOSIS — J0101 Acute recurrent maxillary sinusitis: Secondary | ICD-10-CM

## 2020-08-11 LAB — SARS-COV-2, NAA 2 DAY TAT

## 2020-08-11 LAB — NOVEL CORONAVIRUS, NAA: SARS-CoV-2, NAA: NOT DETECTED

## 2020-08-11 MED ORDER — AMOXICILLIN-POT CLAVULANATE 875-125 MG PO TABS: 1.0000 | ORAL_TABLET | Freq: Two times a day (BID) | ORAL | 0 refills | Status: DC

## 2020-08-11 NOTE — Progress Notes (Signed)
Virtual Visit via [place]  This visit type was conducted due to national recommendations for restrictions regarding the COVID-19 pandemic (e.g. social distancing).  This format is felt to be most appropriate for this patient at this time.  All issues noted in this document were discussed and addressed.  No physical exam was performed (except for noted visual exam findings with Video Visits).   I connected with@ on 08/11/20 at  4:30 PM EDT by a video enabled telemedicine application and verified that I am speaking with the correct person using two identifiers. Location patient: home Location provider: work or home office Persons participating in the virtual visit: patient, provider  I discussed the limitations, risks, security and privacy concerns of performing an evaluation and management service by telephone and the availability of in person appointments. I also discussed with the patient that there may be a patient responsible charge related to this service. The patient expressed understanding and agreed to proceed.  Reason for visit: acute URI  HPI:  39 yr old female with neutropenia , currently under workup , asthma,  Recurrent URI's , presents with 2 week history of upper respiratory infection that  Started with tonsillar swelling,  Sinus pressure , congestion and headaches.  She has not left the house in weeks, and has been using flonase and zyrtec regularly for management of allergic rhinitis.  Children are home schooled.  Was tested for COVID AND NEGATIVE ON OCT 20TH.  EARS are ringing and equilibrium feels off.  Using sinus rinses  .     ROS: See pertinent positives and negatives per HPI.  Past Medical History:  Diagnosis Date  . Allergy   . Anemia   . Anxiety   . Asthma   . Chlamydia    h/o  . DDD (degenerative disc disease), cervical 04/07/2019  . Disorder of vocal cord    spasmodic dysphonia  . Dysmenorrhea   . Dyspareunia, female   . Endometriosis   . Family history  of endometriosis   . GERD (gastroesophageal reflux disease)   . Headache    migraine  . Heavy periods   . Hiatal hernia   . History of nephrolithiasis   . Hypertension   . Jaundice, physiologic, newborn   . Obesity (BMI 30-39.9)   . Sleep apnea   . Tobacco user   . Vaginal Pap smear, abnormal     Past Surgical History:  Procedure Laterality Date  . DILATATION & CURETTAGE/HYSTEROSCOPY WITH MYOSURE N/A 08/27/2018   Procedure: DILATATION & CURETTAGE/HYSTEROSCOPY WITH MINERVA;  Surgeon: Gae Dry, MD;  Location: [facility] ORS;  Service: Gynecology;  Laterality: N/A;  . DILATION AND CURETTAGE OF UTERUS    . ESOPHAGOGASTRODUODENOSCOPY (EGD) WITH PROPOFOL N/A 08/07/2018   Procedure: ESOPHAGOGASTRODUODENOSCOPY (EGD) WITH PROPOFOL;  Surgeon: Jonathon Bellows, MD;  Location[facility] ENDOSCOPY;  Service: Gastroenterology;  Laterality: N/A;  . laparoscopy    . ROOT CANAL    . WISDOM TOOTH EXTRACTION      Family History  Problem Relation Age of Onset  . Endometriosis Mother   . Cancer Father        benign liver CA  . Stroke Father        recurrent blood clots on the brain   . Lupus Father   . Cancer Maternal Grandmother 30       ovarian Ca, still surviving   . Osteoporosis Maternal Grandmother   . Ovarian cancer Maternal Grandmother   . Heart disease Maternal Grandfather   . Cancer  Maternal Grandfather 71       colon CA. metastatic  . Diabetes Maternal Uncle   . Heart disease Paternal Grandmother   . CAD Brother 62       early at age 81  . Breast cancer Cousin   . Colon cancer Neg Hx   . Kidney cancer Neg Hx   . Bladder Cancer Neg Hx     SOCIAL HX:  reports that she quit smoking about 3 years ago. Her smoking use included cigarettes. She quit after 10.00 years of use. She has never used smokeless tobacco. She reports previous alcohol use. She reports that she does not use drugs.   Current Outpatient Medications:  .  ADVAIR HFA 660-63 MCG/ACT inhaler, USE 2 INHALATIONS TWICE A DAY,  Disp: 36 g, Rfl: 3 .  albuterol (PROAIR HFA) 108 (90 Base) MCG/ACT inhaler, Inhale 1-2 puffs into the lungs every 6 (six) hours as needed for wheezing or shortness of breath., Disp: 8 g, Rfl: 2 .  Bacillus Coagulans-Inulin (PROBIOTIC-PREBIOTIC PO), Take by mouth., Disp: , Rfl:  .  Cholecalciferol (VITAMIN D3) 125 MCG (5000 UT) TABS, Take 1 tablet by mouth daily. , Disp: , Rfl:  .  EPINEPHrine 0.3 mg/0.3 mL IJ SOAJ injection, See admin instructions. for allergic reaction, Disp: , Rfl:  .  famotidine (PEPCID) 20 MG tablet, Take 20 mg by mouth 2 (two) times daily., Disp: , Rfl:  .  montelukast (SINGULAIR) 10 MG tablet, TAKE 1 TABLET AT BEDTIME, Disp: 90 tablet, Rfl: 3 .  Multiple Vitamin (MULTIVITAMIN) tablet, Take 1 tablet by mouth daily., Disp: , Rfl:  .  nebivolol (BYSTOLIC) 5 MG tablet, Take 1 tablet (5 mg total) by mouth daily., Disp: 90 tablet, Rfl: 3 .  pantoprazole (PROTONIX) 40 MG tablet, TAKE 1 TABLET TWICE A DAY, Disp: 180 tablet, Rfl: 3 .  vitamin B-12 (CYANOCOBALAMIN) 1000 MCG tablet, Take 1,000 mcg by mouth daily., Disp: , Rfl:  .  amoxicillin-clavulanate (AUGMENTIN) 875-125 MG tablet, Take 1 tablet by mouth 2 (two) times daily., Disp: 14 tablet, Rfl: 0  EXAM:  VITALS per patient if applicable:  GENERAL: alert, oriented, appears well and in no acute distress  HEENT: atraumatic, conjunttiva clear, no obvious abnormalities on inspection of external nose and ears  NECK: normal movements of the head and neck  LUNGS: on inspection no signs of respiratory distress, breathing rate appears normal, no obvious gross SOB, gasping or wheezing  CV: no obvious cyanosis  MS: moves all visible extremities without noticeable abnormality  PSYCH/NEURO: pleasant and cooperative, no obvious depression or anxiety, speech and thought processing grossly intact  ASSESSMENT AND PLAN:  Discussed the following assessment and plan:  Acute recurrent maxillary sinusitis  Acute sinusitis Given  chronicity of symptoms, development of facial pain and exam consistent with bacterial URI,  Will treat with empiric antibiotics, decongestants, and saline lavage.  Continue zyrtec and  steroid nasal spray     I discussed the assessment and treatment plan with the patient. The patient was provided an opportunity to ask questions and all were answered. The patient agreed with the plan and demonstrated an understanding of the instructions.   The patient was advised to call back or seek an in-person evaluation if the symptoms worsen or if the condition fails to improve as anticipated.  I provided 20 minutes of non-face-to-face time during this encounter.   Crecencio Mc, MD

## 2020-08-11 NOTE — Assessment & Plan Note (Signed)
Given chronicity of symptoms, development of facial pain and exam consistent with bacterial URI,  Will treat with empiric antibiotics, decongestants, and saline lavage.  Continue zyrtec and  steroid nasal spray

## 2020-08-24 ENCOUNTER — Inpatient Hospital Stay: Payer: Commercial Managed Care - PPO | Attending: Oncology

## 2020-08-24 ENCOUNTER — Other Ambulatory Visit: Payer: Self-pay

## 2020-08-24 DIAGNOSIS — D708 Other neutropenia: Secondary | ICD-10-CM | POA: Diagnosis not present

## 2020-08-24 LAB — CBC WITH DIFFERENTIAL/PLATELET
Abs Immature Granulocytes: 0 10*3/uL (ref 0.00–0.07)
Basophils Absolute: 0 10*3/uL (ref 0.0–0.1)
Basophils Relative: 1 %
Eosinophils Absolute: 0.1 10*3/uL (ref 0.0–0.5)
Eosinophils Relative: 4 %
HCT: 39.3 % (ref 36.0–46.0)
Hemoglobin: 13.6 g/dL (ref 12.0–15.0)
Immature Granulocytes: 0 %
Lymphocytes Relative: 51 %
Lymphs Abs: 1.5 10*3/uL (ref 0.7–4.0)
MCH: 31.1 pg (ref 26.0–34.0)
MCHC: 34.6 g/dL (ref 30.0–36.0)
MCV: 89.9 fL (ref 80.0–100.0)
Monocytes Absolute: 0.6 10*3/uL (ref 0.1–1.0)
Monocytes Relative: 19 %
Neutro Abs: 0.8 10*3/uL — ABNORMAL LOW (ref 1.7–7.7)
Neutrophils Relative %: 25 %
Platelets: 182 10*3/uL (ref 150–400)
RBC: 4.37 MIL/uL (ref 3.87–5.11)
RDW: 12.2 % (ref 11.5–15.5)
WBC: 3 10*3/uL — ABNORMAL LOW (ref 4.0–10.5)
nRBC: 0 % (ref 0.0–0.2)

## 2020-08-25 ENCOUNTER — Telehealth: Payer: Commercial Managed Care - PPO | Admitting: Oncology

## 2020-09-04 ENCOUNTER — Encounter: Payer: Self-pay | Admitting: Oncology

## 2020-09-05 NOTE — Progress Notes (Addendum)
New Patient Note  RE: Molly Stone MRN: [id number] DOB: 1981-07-03 Date of Office Visit: 09/06/2020  Referring provider: Crecencio Mc, MD Primary care provider: Crecencio Mc, MD  Chief Complaint: Allergic Reaction (Covid Vaccine), Food Intolerance, and Allergic Rhinitis   History of Present Illness: I had the pleasure of seeing Molly Stone for initial evaluation at the [facility] on 09/07/2020. She is a 39 y.o. female, who is referred here by the vaccination center for the evaluation of vaccine questions.  Vaccine reaction: Patient received her first [place] vaccine and noted that her throat felt weird after 10-15 minutes. She took a benadryl while she was there with minimal benefit. Patient states she felt sick the whole day. The throat sensation resolved after 6 hours. Describes it as throat tightness and felt like something was off.   Patient received her second Pfizer vaccine and broke out in a sweat with whole body flushing and throat dryness. She was evaluated by the paramedics at the vaccination site. She took benadryl x 2. Symptoms resolved within 30 minutes after she went outdoors where it was much cooler.   No known prior COVID-19 infection that patient is aware of but she may have been exposed to it in 2020 as her whole family had some URI symptoms at that time and her symptoms were more severe than a typical cold.   Any known reactions to polyethylene glycol or polysorbate?  Not that she is aware of.   Any history of anaphylaxis to vaccinations? No and gets annual flu shot. Sometimes she gets similar throat tightness in the throat after the flu shot as well. No other symptoms.  Any history of reactions to injectable medications? Morphine - itching and hypotension.   Any history of anaphylaxis to colonoscopy preps (i.e.Miralax)? No issues.   Any history of dermal filler treatments in the last year? No.  Rhinitis: She reports symptoms  of periorbital swelling, rhinitis, coughing, bronchitis, nasal congestion, sneezing, PND. Symptoms have been going on for 30+ years. The symptoms are present all year around. Other triggers include exposure to outdoors, smoke. Anosmia: no. Headache: yes. She has used allegra/D, zyrtec, Flonase, Xyzal, visine  with minimal improvement in symptoms. Sinus infections: 3-4 this year. Previous work up includes: 2021 blood work was positive to dust mites, cat, dog, grass, cockroach, tree.  Borderline positive to ragweed and weed. No prior allergy injections.  Previous ENT evaluation: patient sees one for her throat issues. Previous sinus imaging: no. History of nasal polyps: no. Last eye exam: none. History of reflux: yes and takes Protonix 40mg  daily, has hiatal hernia.  Food allergy: Onions caused throat tightness once but had it since then with no issues. Patient was told to avoid soy, gluten, corn and peanuts based on test results - ordered by her ENT. No prior clinical reactions to the above foods.   Dietary History: patient has been eating other foods including milk, eggs, treenuts, sesame, shellfish, fish, soy, wheat, meats, fruits and vegetables.  She reports reading labels and avoiding peanuts and corn in diet completely.   2021 blood work was negative to milk, wheat 0.89, corn 0.32, peanut 0.33, soy 0.25, yeast negative, cheese negative, cheese mold negative, malt 0.35, chocolate negative, onion 0.33.  Assessment and Plan: Molly Stone is a 39 y.o. female with: Drug reaction Patient concerned about getting booster vaccine as she had some throat tightness with her first [place] vaccine and had more severe symptoms with whole body flushing  and throat dryness after her second Pfizer vaccine. She has history of having issues with her throat though and is being followed by ENT for this. History of anxiety. Flu vaccines cause similar throat discomfort but no other symptoms. Morphine causes itching and  hypotension.   Given above clinical history, recommend getting COVID-19 component testing and depending on results will recommend which booster vaccine to get.  Okay to get flu vaccine as before.   Continue to avoid morphine.   Patient has Epinephrine on hand for allergic reactions.  Other allergic rhinitis Perennial rhino conjunctivitis symptoms for 30+ years. 3-4 sinus infections per year. Prior blood work was positive to dust mites, cat, dog, grass, cockroach, tree.  Borderline positive to ragweed and weed  Today's skin testing showed: Positive to grass, weed, ragweed, trees, mold, dust mites, cat, dog, cockroach. Negative control slightly positive as well. Results given.   Start environmental control measures as below.  May use over the counter antihistamines such as Zyrtec (cetirizine), Claritin (loratadine), Allegra (fexofenadine), or Xyzal (levocetirizine) daily as needed. May take twice a day during flares. Start dymista (fluticasone + azelastine nasal spray combination) 1 spray per nostril twice a day.  This replaces Flonase (fluticasone) for now. If it's not covered let us know.   Nasal saline spray (i.e., Simply Saline) or nasal saline lavage (i.e., NeilMed) is recommended as needed and prior to medicated nasal sprays.  Continue Singulair (montelukast) 10mg  daily at night.  If above regimen does not control symptoms, will discuss allergy immunotherapy next.   Adverse food reaction Patient concerned about food allergies as she had some bloodwork drawn which showed borderline positives to wheat, corn, peanut, soy, malt and onion. Only had one episode of throat tightness with onions and since then had onions with no issues. Tolerates all the other foods with no issues.  Today's skin testing was negative to common foods including corn and onion.  Bloodwork was most likely irrelevant sensitization.   No dietary restrictions.  If you notice any symptoms with any foods - keep  a log and avoid.   Given her significant environmental allergies, there is a possibility that she has oral allergy syndrome.   Discussed that her food triggered oral and throat symptoms are likely caused by oral food allergy syndrome (OFAS). This is caused by cross reactivity of pollen with fresh fruits and vegetables, and nuts. Symptoms are usually localized in the form of itching and burning in mouth and throat. Very rarely it can progress to more severe symptoms. Eating foods in cooked or processed forms usually minimizes symptoms. I recommended avoidance of eating the problem foods, especially during the peak season(s). Sometimes, OFAS can induce severe throat swelling or even a systemic reaction; with such instance, I advised them to report to a local ER. A list of common pollens and food cross-reactivities was provided to the patient.   For mild symptoms you can take over the counter antihistamines such as Benadryl and monitor symptoms closely. If symptoms worsen or if you have severe symptoms including breathing issues, throat closure, significant swelling, whole body hives, severe diarrhea and vomiting, lightheadedness then inject epinephrine and seek immediate medical care afterwards.  Moderate persistent asthma without complication Diagnosed with asthma over 20 years ago. Currently on Advair 274mcg 2 puffs BID, Singulair daily and using albuterol as needed. Albuterol was causing palpitations but xopenex nebulizer caused throat tightness.  Today's spirometry was normal with no improvement in FEV1 post bronchodilator treatment. Clinically feeling the same. Interestingly patient  was coughing after the treatment.  . Daily controller medication(s): Advair 245mcg 2 puffs twice a day with spacer and rinse mouth afterwards. Marland Kitchen Spacer given and demonstrated proper use with inhaler. Patient understood technique and all questions/concerned were addressed.  o Hopefully this will help decrease the  coughing/throat sensations after using inhalers. . Continue Singulair 10mg  daily.  . May use albuterol rescue inhaler 2 puffs every 4 to 6 hours as needed for shortness of breath, chest tightness, coughing, and wheezing. May use albuterol rescue inhaler 2 puffs 5 to 15 minutes prior to strenuous physical activities. Monitor frequency of use.  . Repeat spirometry at next visit.   History of atopic dermatitis Noted increased rashes since off antihistamines. History of eczema in the past.  See below for proper skin care.   History of frequent upper respiratory infection History of frequent URIs and had 6 antibiotics this year. Being followed by hematology for low WBC.  Marland Kitchen Keep track of infections. . Get bloodwork to look at immune system.   Heartburn Question if some of her throat sensation may be contributed by her reflux as well.  Continue with reflux medication (protonix) and lifestyle diet as below.  Return in about 2 months (around 11/06/2020).  Meds ordered this encounter  Medications  . Azelastine-Fluticasone 137-50 MCG/ACT SUSP    Sig: Place 1 spray into the nose in the morning and at bedtime.    Dispense:  23 g    Refill:  5    Lab Orders     CBC with Differential/Platelet     Tryptase     Strep pneumoniae 23 Serotypes IgG     Diphtheria / Tetanus Antibody Panel     Complement, total     Chronic Urticaria     Alpha-Gal Panel  Other allergy screening: Asthma: yes She reports symptoms of chest tightness, shortness of breath, coughing, wheezing, nocturnal awakenings for 20+ years. Current medications include Advair 271mcg 2 puffs BID, Singulair and albuterol prn which help. She reports not using aerochamber with inhalers. She tried the following inhalers: xopenex nebulizer but it caused some type of throat tightness. Main triggers are allergies, exercise, pet exposure. In the last month, frequency of symptoms: depends on situation.  Frequency of SABA use: twice a day which  caused some palpitations so now cut back. In the last 12 months, emergency room visits/urgent care visits/doctor office visits or hospitalizations due to respiratory issues: 0. In the last 12 months, oral steroids courses: 0. Lifetime history of hospitalization for respiratory issues: 0. Prior intubations: 0. History of pneumonia: once. She was evaluated by allergist/pulmonologist in the past. Smoking exposure: quit. Up to date with flu vaccine: yes. Up to date with COVID-19 vaccine: yes.   Hymenoptera allergy: large localized reactions.  Urticaria: no Eczema:yes  Noted some rashes since off antihistamines.  History of recurrent infections suggestive of immunodeficency:  Patient has history multiple infections including sinus infection, pneumonia, ear infections. Denies any GI infections/diarrhea, skin infections/abscesses. Patient has no history of opportunistic infections including fungal infections, viral infections.   No prior immunoglobulin levels but has low WBC and being followed by hematology. Patient reports 6 antibiotic use in the last 12 months and 0 hospital admissions. Patient does not have any secondary causes of immunodeficiency including chronic steroid use, diabetes mellitus, protein losing enteropathy, renal or hepatic dysfunction, history of cancer or irradiation or history of HIV, hepatitis B or C.  Diagnostics: Spirometry:  Tracings reviewed. Her effort: Good reproducible efforts. FVC: 4.04L  FEV1: 3.50L, 99% predicted FEV1/FVC ratio: 87% Interpretation: Spirometry consistent with normal pattern with no improvement in FEV1 post bronchodilator treatment. Clinically feeling the same.  Please see scanned spirometry results for details.  Skin Testing: Environmental allergy panel and select foods. Positive to grass, weed, ragweed, trees, mold, dust mites, cat, dog, cockroach. Negative to foods. Patient's negative control was slightly positive as well. Results discussed with  patient/family.  Airborne Adult Perc - 09/06/20 1424    Time Antigen Placed 1424    Allergen Manufacturer Lavella Hammock    Location Back    Number of Test 59    Panel 1 Select    1. Control-Buffer 50% Glycerol Negative    2. Control-Histamine 1 mg/ml 2+    3. Albumin saline --   +/-   4. [place] 4+    5. [place] 3+    6. [place] 4+    7. [place] Blue 3+    8. Meadow Fescue 2+    9. Perennial Rye 3+    10. Sweet Vernal 3+    11. Timothy 4+    12. Cocklebur 2+    13. Burweed Marshelder Negative    14. Ragweed, short 4+    15. Ragweed, Giant 3+    16. Plantain,  English 2+    17. Lamb's Quarters Negative    18. Sheep Sorrell 2+    19. Rough Pigweed --   +/-   20. Marsh Elder, Rough 2+    21. Mugwort, Common Negative    22. Ash mix Negative    23. Birch mix Negative    24. Beech American Negative    25. Box, Elder 2+    26. Cedar, red 3+    27. Cottonwood, Eastern 2+    28. Elm mix Negative    29. Hickory 3+    30. Maple mix Negative    31. Oak, [place] mix Negative    32. Pecan Pollen 2+    33. Pine mix Negative    34. Sycamore Eastern Negative    35. [place], Black Pollen Negative    36. Alternaria alternata Negative    37. Cladosporium Herbarum Negative    38. Aspergillus mix Negative    39. Penicillium mix Negative    40. Bipolaris sorokiniana (Helminthosporium) Negative    41. Drechslera spicifera (Curvularia) Negative    42. Mucor plumbeus Negative    43. Fusarium moniliforme Negative    44. Aureobasidium pullulans (pullulara) Negative    45. Rhizopus oryzae Negative    46. Botrytis cinera Negative    47. Epicoccum nigrum Negative    48. Phoma betae Negative    49. Candida Albicans Negative    50. Trichophyton mentagrophytes Negative    51. Mite, D Farinae  5,000 AU/ml Negative    52. Mite, D Pteronyssinus  5,000 AU/ml 2+    53. Cat Hair 10,000 BAU/ml 4+    54.  Dog Epithelia Negative    55. Mixed Feathers Negative    56. Horse Epithelia Negative    57.  Cockroach, German Negative    58. Mouse Negative    59. Tobacco Leaf Negative          Intradermal - 09/06/20 1507    Time Antigen Placed 1507    Allergen Manufacturer Lavella Hammock    Location Arm    Number of Test 7    Intradermal Select    Control Negative    Mold 1 Negative    Mold 2 2+  Mold 3 Negative    Mold 4 Negative    Dog 2+    Cockroach 3+          Food Adult Perc - 09/06/20 1400    Time Antigen Placed 1424    Allergen Manufacturer Lavella Hammock    Location Back    1. Peanut Negative    2. Soybean Negative    3. Wheat Negative    4. Sesame Negative    5. Milk, cow Negative    6. Egg White, Chicken Negative    7. Casein Negative    8. Shellfish Mix Negative    9. Fish Mix Negative    10. Cashew Negative    49. Onion Negative    53. Corn Negative           Past Medical History: Patient Active Problem List   Diagnosis Date Noted  . Drug reaction 09/06/2020  . History of frequent upper respiratory infection 09/06/2020  . History of atopic dermatitis 09/06/2020  . Moderate persistent asthma without complication 09/47/0962  . Rash and other nonspecific skin eruption 09/06/2020  . Heartburn 09/06/2020  . Adverse food reaction 09/06/2020  . Non-cardiac chest pain 03/30/2020  . Otitis media, left 03/30/2020  . B12 deficiency 03/17/2020  . Multiple food allergies 02/07/2020  . Vasomotor flushing 02/07/2020  . Neutropenia ([place]) 07/12/2019  . DDD (degenerative disc disease), cervical 04/07/2019  . Hyperlipidemia 04/07/2019  . Abnormal MRI, cervical spine 04/07/2019  . Right calf pain 03/24/2019  . Paroxysmal tachycardia ([place]) 01/28/2019  . Anxiety 01/28/2019  . Chronic bilateral thoracic back pain 03/07/2017  . Spinal stenosis 03/05/2017  . Concussion without loss of consciousness 08/04/2016  . OSA (obstructive sleep apnea) 07/24/2016  . Generalized anxiety disorder 10/25/2015  . Family history of endometriosis 10/12/2015  . Dysmenorrhea 10/12/2015  . H/O  renal calculi 08/15/2015  . Morbid obesity ([place]) 08/15/2015  . Encounter for preventive health examination 06/16/2014  . Sciatica 04/01/2014  . Acute sinusitis 10/09/2012  . Other allergic rhinitis 04/12/2012  . Vitamin D deficiency 04/12/2012  . 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  . GERD (gastroesophageal reflux disease)    Past Medical History:  Diagnosis Date  . Allergy   . Anemia   . Anxiety   . Asthma   . Chlamydia    h/o  . DDD (degenerative disc disease), cervical 04/07/2019  . Disorder of vocal cord    spasmodic dysphonia  . Dysmenorrhea   . Dyspareunia, female   . Endometriosis   . Family history of endometriosis   . GERD (gastroesophageal reflux disease)   . Headache    migraine  . Heavy periods   . Hiatal hernia   . History of nephrolithiasis   . Hypertension   . Jaundice, physiologic, newborn   . Obesity (BMI 30-39.9)   . Sleep apnea   . Tobacco user   . Vaginal Pap smear, abnormal    Past Surgical History: Past Surgical History:  Procedure Laterality Date  . DILATATION & CURETTAGE/HYSTEROSCOPY WITH MYOSURE N/A 08/27/2018   Procedure: DILATATION & CURETTAGE/HYSTEROSCOPY WITH MINERVA;  Surgeon: Gae Dry, MD;  Location: [facility] ORS;  Service: Gynecology;  Laterality: N/A;  . DILATION AND CURETTAGE OF UTERUS    . ESOPHAGOGASTRODUODENOSCOPY (EGD) WITH PROPOFOL N/A 08/07/2018   Procedure: ESOPHAGOGASTRODUODENOSCOPY (EGD) WITH PROPOFOL;  Surgeon: Jonathon Bellows, MD;  Location: [facility] ENDOSCOPY;  Service: Gastroenterology;  Laterality: N/A;  . laparoscopy    . ROOT CANAL    . WISDOM TOOTH EXTRACTION  Medication List:  Current Outpatient Medications  Medication Sig Dispense Refill  . ADVAIR HFA 230-21 MCG/ACT inhaler USE 2 INHALATIONS TWICE A DAY 36 g 3  . albuterol (PROAIR HFA) 108 (90 Base) MCG/ACT inhaler Inhale 1-2 puffs into the lungs every 6 (six) hours as needed for wheezing or shortness of breath. 8 g 2  . Bacillus Coagulans-Inulin (PROBIOTIC-PREBIOTIC PO)  Take by mouth.    . Cholecalciferol (VITAMIN D3) 125 MCG (5000 UT) TABS Take 1 tablet by mouth daily.     Marland Kitchen EPINEPHrine 0.3 mg/0.3 mL IJ SOAJ injection See admin instructions. for allergic reaction    . montelukast (SINGULAIR) 10 MG tablet TAKE 1 TABLET AT BEDTIME 90 tablet 3  . Multiple Vitamin (MULTIVITAMIN) tablet Take 1 tablet by mouth daily.    . nebivolol (BYSTOLIC) 5 MG tablet Take 1 tablet (5 mg total) by mouth daily. 90 tablet 3  . vitamin B-12 (CYANOCOBALAMIN) 1000 MCG tablet Take 1,000 mcg by mouth daily.    . Azelastine-Fluticasone 137-50 MCG/ACT SUSP Place 1 spray into the nose in the morning and at bedtime. 23 g 5  . famotidine (PEPCID) 20 MG tablet Take 20 mg by mouth 2 (two) times daily. (Patient not taking: Reported on 09/06/2020)    . pantoprazole (PROTONIX) 40 MG tablet TAKE 1 TABLET TWICE A DAY 180 tablet 1   No current facility-administered medications for this visit.   Allergies: Allergies  Allergen Reactions  . Buprenorphine Hcl Itching and Other (See Comments)    And shaking  . Morphine And Related Itching and Other (See Comments)    And shaking  . Prednisone Anxiety and Palpitations  . Tramadol Hcl Other (See Comments)    anxious   Social History: Social History   Socioeconomic History  . Marital status: Married    Spouse name: Not on file  . Number of children: Not on file  . Years of education: Not on file  . Highest education level: Not on file  Occupational History  . Not on file  Tobacco Use  . Smoking status: Former Smoker    Years: 10.00    Types: Cigarettes    Quit date: 10/2016    Years since quitting: 3.8  . Smokeless tobacco: Never Used  Vaping Use  . Vaping Use: Former  Substance and Sexual Activity  . Alcohol use: Not Currently    Alcohol/week: 0.0 standard drinks  . Drug use: No  . Sexual activity: Yes    Birth control/protection: None    Comment: vasectomy  Other Topics Concern  . Not on file  Social History Narrative  .  Not on file   Social Determinants of Health   Financial Resource Strain:   . Difficulty of Paying Living Expenses: Not on file  Food Insecurity:   . Worried About Charity fundraiser in the Last Year: Not on file  . Ran Out of Food in the Last Year: Not on file  Transportation Needs:   . Lack of Transportation (Medical): Not on file  . Lack of Transportation (Non-Medical): Not on file  Physical Activity:   . Days of Exercise per Week: Not on file  . Minutes of Exercise per Session: Not on file  Stress:   . Feeling of Stress : Not on file  Social Connections:   . Frequency of Communication with Friends and Family: Not on file  . Frequency of Social Gatherings with Friends and Family: Not on file  . Attends Religious Services: Not on  file  . Active Member of Clubs or Organizations: Not on file  . Attends Archivist Meetings: Not on file  . Marital Status: Not on file   Lives in a 38 year old home. Smoking: quit Occupation: stays at home  Environmental History: Water Damage/mildew in the house: yes Carpet in the family room: yes Carpet in the bedroom: yes Heating: electric Cooling: central Pet: yes 1 dog x 5 yrs  Family History: Family History  Problem Relation Age of Onset  . Endometriosis Mother   . Allergic rhinitis Mother   . Cancer Father        benign liver CA  . Stroke Father        recurrent blood clots on the brain   . Lupus Father   . Cancer Maternal Grandmother 30       ovarian Ca, still surviving   . Osteoporosis Maternal Grandmother   . Ovarian cancer Maternal Grandmother   . Heart disease Maternal Grandfather   . Cancer Maternal Grandfather 27       colon CA. metastatic  . Diabetes Maternal Uncle   . Heart disease Paternal Grandmother   . CAD Brother 90       early at age 69  . Immunodeficiency Brother   . Breast cancer Cousin   . Colon cancer Neg Hx   . Kidney cancer Neg Hx   . Bladder Cancer Neg Hx   . Angioedema Neg Hx   . Atopy  Neg Hx   . Asthma Neg Hx   . Eczema Neg Hx   . Urticaria Neg Hx    Review of Systems  Constitutional: Negative for appetite change, chills, fever and unexpected weight change.  HENT: Positive for congestion and rhinorrhea.   Eyes: Positive for itching.  Respiratory: Positive for cough. Negative for chest tightness, shortness of breath and wheezing.   Cardiovascular: Negative for chest pain.  Gastrointestinal: Negative for abdominal pain.  Genitourinary: Negative for difficulty urinating.  Skin: Positive for rash.  Allergic/Immunologic: Positive for environmental allergies.  Neurological: Positive for headaches.   Objective: BP 118/74   Pulse 83   Temp 98 F (36.7 C) (Temporal)   Resp 16   Ht 5\' 9"  (1.753 m)   Wt 299 lb 6.4 oz (135.8 kg)   SpO2 95%   BMI 44.21 kg/m  Body mass index is 44.21 kg/m. Physical Exam Vitals and nursing note reviewed.  Constitutional:      Appearance: Normal appearance. She is well-developed. She is obese.  HENT:     Head: Normocephalic and atraumatic.     Right Ear: Tympanic membrane and external ear normal.     Left Ear: Tympanic membrane and external ear normal.     Nose: Nose normal.     Mouth/Throat:     Mouth: Mucous membranes are moist.     Pharynx: Oropharynx is clear.  Eyes:     Conjunctiva/sclera: Conjunctivae normal.  Cardiovascular:     Rate and Rhythm: Normal rate and regular rhythm.     Heart sounds: Normal heart sounds. No murmur heard.  No friction rub. No gallop.   Pulmonary:     Effort: Pulmonary effort is normal.     Breath sounds: Normal breath sounds. No wheezing, rhonchi or rales.  Musculoskeletal:     Cervical back: Neck supple.  Skin:    General: Skin is warm.  Neurological:     Mental Status: She is alert and oriented to person, place, and time.  Psychiatric:        Behavior: Behavior normal.    The plan was reviewed with the patient/family, and all questions/concerned were addressed.  It was my pleasure  to see Nathania today and participate in her care. Please feel free to contact me with any questions or concerns.  Sincerely,  Rexene Alberts, DO Allergy & Immunology  [facility] office: [phone] [facility] office: [phone]  90 minutes spent face-to-face with more than 50% of the time spent discussing vaccine reactions, drug allergies, food allergies, asthma, immunodeficiency work up, heartburn, rash, coughing.

## 2020-09-06 ENCOUNTER — Ambulatory Visit: Payer: Commercial Managed Care - PPO | Admitting: Allergy

## 2020-09-06 ENCOUNTER — Other Ambulatory Visit: Payer: Self-pay

## 2020-09-06 ENCOUNTER — Encounter: Payer: Self-pay | Admitting: Allergy

## 2020-09-06 VITALS — BP 118/74 | HR 83 | Temp 98.0°F | Resp 16 | Ht 69.0 in | Wt 299.4 lb

## 2020-09-06 DIAGNOSIS — T781XXD Other adverse food reactions, not elsewhere classified, subsequent encounter: Secondary | ICD-10-CM

## 2020-09-06 DIAGNOSIS — T50905A Adverse effect of unspecified drugs, medicaments and biological substances, initial encounter: Secondary | ICD-10-CM | POA: Insufficient documentation

## 2020-09-06 DIAGNOSIS — Z8709 Personal history of other diseases of the respiratory system: Secondary | ICD-10-CM | POA: Insufficient documentation

## 2020-09-06 DIAGNOSIS — J4540 Moderate persistent asthma, uncomplicated: Secondary | ICD-10-CM

## 2020-09-06 DIAGNOSIS — R21 Rash and other nonspecific skin eruption: Secondary | ICD-10-CM | POA: Diagnosis not present

## 2020-09-06 DIAGNOSIS — J3089 Other allergic rhinitis: Secondary | ICD-10-CM | POA: Diagnosis not present

## 2020-09-06 DIAGNOSIS — R12 Heartburn: Secondary | ICD-10-CM

## 2020-09-06 DIAGNOSIS — Z872 Personal history of diseases of the skin and subcutaneous tissue: Secondary | ICD-10-CM

## 2020-09-06 DIAGNOSIS — T50905D Adverse effect of unspecified drugs, medicaments and biological substances, subsequent encounter: Secondary | ICD-10-CM

## 2020-09-06 DIAGNOSIS — T781XXA Other adverse food reactions, not elsewhere classified, initial encounter: Secondary | ICD-10-CM | POA: Insufficient documentation

## 2020-09-06 MED ORDER — AZELASTINE-FLUTICASONE 137-50 MCG/ACT NA SUSP: 1.0000 | Freq: Two times a day (BID) | NASAL | 5 refills | Status: DC

## 2020-09-06 NOTE — Assessment & Plan Note (Signed)
Patient concerned about food allergies as she had some bloodwork drawn which showed borderline positives to wheat, corn, peanut, soy, malt and onion. Only had one episode of throat tightness with onions and since then had onions with no issues. Tolerates all the other foods with no issues.  Today's skin testing was negative to common foods including corn and onion.  Bloodwork was most likely irrelevant sensitization.   No dietary restrictions.  If you notice any symptoms with any foods - keep a log and avoid.   Given her significant environmental allergies, there is a possibility that she has oral allergy syndrome.   Discussed that her food triggered oral and throat symptoms are likely caused by oral food allergy syndrome (OFAS). This is caused by cross reactivity of pollen with fresh fruits and vegetables, and nuts. Symptoms are usually localized in the form of itching and burning in mouth and throat. Very rarely it can progress to more severe symptoms. Eating foods in cooked or processed forms usually minimizes symptoms. I recommended avoidance of eating the problem foods, especially during the peak season(s). Sometimes, OFAS can induce severe throat swelling or even a systemic reaction; with such instance, I advised them to report to a local ER. A list of common pollens and food cross-reactivities was provided to the patient.   For mild symptoms you can take over the counter antihistamines such as Benadryl and monitor symptoms closely. If symptoms worsen or if you have severe symptoms including breathing issues, throat closure, significant swelling, whole body hives, severe diarrhea and vomiting, lightheadedness then inject epinephrine and seek immediate medical care afterwards.

## 2020-09-06 NOTE — Patient Instructions (Addendum)
Today's skin testing showed: Positive to grass, weed, ragweed, trees, mold, dust mites, cat, dog, cockroach. Negative to foods. Results given.   Environmental allergies  Start environmental control measures as below.  May use over the counter antihistamines such as Zyrtec (cetirizine), Claritin (loratadine), Allegra (fexofenadine), or Xyzal (levocetirizine) daily as needed. May take twice a day during flares. Start dymista (fluticasone + azelastine nasal spray combination) 1 spray per nostril twice a day.  This replaces Flonase (fluticasone) for now. If it's not covered let us know.   Nasal saline spray (i.e., Simply Saline) or nasal saline lavage (i.e., NeilMed) is recommended as needed and prior to medicated nasal sprays.  Continue Singulair (montelukast) 10mg  daily at night.  Vaccine:  Recommend getting COVID-19 component testing.  Have to be off allergy medications for 3 days.  Please schedule when you can.   Meanwhile avoid the Booster for now.   Asthma: . Daily controller medication(s): Advair 224mcg 2 puffs twice a day with spacer and rinse mouth afterwards. Marland Kitchen Spacer given and demonstrated proper use with inhaler. Patient understood technique and all questions/concerned were addressed.  . Continue Singulair 10mg  daily.  . May use albuterol rescue inhaler 2 puffs every 4 to 6 hours as needed for shortness of breath, chest tightness, coughing, and wheezing. May use albuterol rescue inhaler 2 puffs 5 to 15 minutes prior to strenuous physical activities. Monitor frequency of use.  . Asthma control goals:  o Full participation in all desired activities (may need albuterol before activity) o Albuterol use two times or less a week on average (not counting use with activity) o Cough interfering with sleep two times or less a month o Oral steroids no more than once a year o No hospitalizations  Food:  No dietary restrictions.  If you notice any symptoms with any foods - keep  a log and avoid.   Discussed that her food triggered oral and throat symptoms are likely caused by oral food allergy syndrome (OFAS). This is caused by cross reactivity of pollen with fresh fruits and vegetables, and nuts. Symptoms are usually localized in the form of itching and burning in mouth and throat. Very rarely it can progress to more severe symptoms. Eating foods in cooked or processed forms usually minimizes symptoms. I recommended avoidance of eating the problem foods, especially during the peak season(s). Sometimes, OFAS can induce severe throat swelling or even a systemic reaction; with such instance, I advised them to report to a local ER. A list of common pollens and food cross-reactivities was provided to the patient.   For mild symptoms you can take over the counter antihistamines such as Benadryl and monitor symptoms closely. If symptoms worsen or if you have severe symptoms including breathing issues, throat closure, significant swelling, whole body hives, severe diarrhea and vomiting, lightheadedness then inject epinephrine and seek immediate medical care afterwards.  Infections:  . Keep track of infections. . Get bloodwork:  o We are ordering labs, so please allow 1-2 weeks for the results to come back. o With the newly implemented Cures Act, the labs might be visible to you at the same time that they become visible to me. However, I will not address the results until all of the results are back, so please be patient.   Reflux/coughing:  Continue with reflux medication and lifestyle diet as below.  Rash:  See below for proper skin care.   Follow up in 2 months or sooner if needed.   Recommend annual eye exam.  Reducing Pollen Exposure . Pollen seasons: trees (spring), grass (summer) and ragweed/weeds (fall). Marland Kitchen Keep windows closed in your home and car to lower pollen exposure.  Susa Simmonds air conditioning in the bedroom and throughout the house if possible.  . Avoid  going out in dry windy days - especially early morning. . Pollen counts are highest between 5 - 10 AM and on dry, hot and windy days.  . Save outside activities for late afternoon or after a heavy rain, when pollen levels are lower.  . Avoid mowing of grass if you have grass pollen allergy. Marland Kitchen Be aware that pollen can also be transported indoors on people and pets.  . Dry your clothes in an automatic dryer rather than hanging them outside where they might collect pollen.  . Rinse hair and eyes before bedtime. Mold Control . Mold and fungi can grow on a variety of surfaces provided certain temperature and moisture conditions exist.  . Outdoor molds grow on plants, decaying vegetation and soil. The major outdoor mold, Alternaria and Cladosporium, are found in very high numbers during hot and dry conditions. Generally, a late summer - fall peak is seen for common outdoor fungal spores. Rain will temporarily lower outdoor mold spore count, but counts rise rapidly when the rainy period ends. . The most important indoor molds are Aspergillus and Penicillium. Dark, humid and poorly ventilated basements are ideal sites for mold growth. The next most common sites of mold growth are the bathroom and the kitchen. Outdoor (Seasonal) Mold Control . Use air conditioning and keep windows closed. . Avoid exposure to decaying vegetation. Marland Kitchen Avoid leaf raking. . Avoid grain handling. . Consider wearing a face mask if working in moldy areas.  Indoor (Perennial) Mold Control  . Maintain humidity below 50%. . Get rid of mold growth on hard surfaces with water, detergent and, if necessary, 5% bleach (do not mix with other cleaners). Then dry the area completely. If mold covers an area more than 10 square feet, consider hiring an indoor environmental professional. . For clothing, washing with soap and water is best. If moldy items cannot be cleaned and dried, throw them away. . Remove sources e.g. contaminated  carpets. . Repair and seal leaking roofs or pipes. Using dehumidifiers in damp basements may be helpful, but empty the water and clean units regularly to prevent mildew from forming. All rooms, especially basements, bathrooms and kitchens, require ventilation and cleaning to deter mold and mildew growth. Avoid carpeting on concrete or damp floors, and storing items in damp areas. Control of House Dust Mite Allergen . Dust mite allergens are a common trigger of allergy and asthma symptoms. While they can be found throughout the house, these microscopic creatures thrive in warm, humid environments such as bedding, upholstered furniture and carpeting. . Because so much time is spent in the bedroom, it is essential to reduce mite levels there.  . Encase pillows, mattresses, and box springs in special allergen-proof fabric covers or airtight, zippered plastic covers.  . Bedding should be washed weekly in hot water (130 F) and dried in a hot dryer. Allergen-proof covers are available for comforters and pillows that can't be regularly washed.  Wendee Copp the allergy-proof covers every few months. Minimize clutter in the bedroom. Keep pets out of the bedroom.  Marland Kitchen Keep humidity less than 50% by using a dehumidifier or air conditioning. You can buy a humidity measuring device called a hygrometer to monitor this.  . If possible, replace carpets with  hardwood, linoleum, or washable area rugs. If that's not possible, vacuum frequently with a vacuum that has a HEPA filter. . Remove all upholstered furniture and non-washable window drapes from the bedroom. . Remove all non-washable stuffed toys from the bedroom.  Wash stuffed toys weekly. Pet Allergen Avoidance: . Contrary to popular opinion, there are no "hypoallergenic" breeds of dogs or cats. That is because people are not allergic to an animal's hair, but to an allergen found in the animal's saliva, dander (dead skin flakes) or urine. Pet allergy symptoms typically  occur within minutes. For some people, symptoms can build up and become most severe 8 to 12 hours after contact with the animal. People with severe allergies can experience reactions in public places if dander has been transported on the pet owners' clothing. Marland Kitchen Keeping an animal outdoors is only a partial solution, since homes with pets in the yard still have higher concentrations of animal allergens. . Before getting a pet, ask your allergist to determine if you are allergic to animals. If your pet is already considered part of your family, try to minimize contact and keep the pet out of the bedroom and other rooms where you spend a great deal of time. . As with dust mites, vacuum carpets often or replace carpet with a hardwood floor, tile or linoleum. . High-efficiency particulate air (HEPA) cleaners can reduce allergen levels over time. . While dander and saliva are the source of cat and dog allergens, urine is the source of allergens from rabbits, hamsters, mice and [place] pigs; so ask a non-allergic family member to clean the animal's cage. . If you have a pet allergy, talk to your allergist about the potential for allergy immunotherapy (allergy shots). This strategy can often provide long-term relief. Cockroach Allergen Avoidance Cockroaches are often found in the homes of densely populated urban areas, schools or commercial buildings, but these creatures can lurk almost anywhere. This does not mean that you have a dirty house or living area. . Block all areas where roaches can enter the home. This includes crevices, wall cracks and windows.  . Cockroaches need water to survive, so fix and seal all leaky faucets and pipes. Have an exterminator go through the house when your family and pets are gone to eliminate any remaining roaches. Marland Kitchen Keep food in lidded containers and put pet food dishes away after your pets are done eating. Vacuum and sweep the floor after meals, and take out garbage and  recyclables. Use lidded garbage containers in the kitchen. Wash dishes immediately after use and clean under stoves, refrigerators or toasters where crumbs can accumulate. Wipe off the stove and other kitchen surfaces and cupboards regularly.  Skin care recommendations  Bath time: . Always use lukewarm water. AVOID very hot or cold water. Marland Kitchen Keep bathing time to 5-10 minutes. . Do NOT use bubble bath. . Use a mild soap and use just enough to wash the dirty areas. . Do NOT scrub skin vigorously.  . After bathing, pat dry your skin with a towel. Do NOT rub or scrub the skin.  Moisturizers and prescriptions:  . ALWAYS apply moisturizers immediately after bathing (within 3 minutes). This helps to lock-in moisture. . Use the moisturizer several times a day over the whole body. Kermit Balo summer moisturizers include: Aveeno, CeraVe, Cetaphil. Kermit Balo winter moisturizers include: Aquaphor, Vaseline, Cerave, Cetaphil, Eucerin, Vanicream. . When using moisturizers along with medications, the moisturizer should be applied about one hour after applying the medication to  prevent diluting effect of the medication or moisturize around where you applied the medications. When not using medications, the moisturizer can be continued twice daily as maintenance.  Laundry and clothing: . Avoid laundry products with added color or perfumes. . Use unscented hypo-allergenic laundry products such as Tide free, Cheer free & gentle, and All free and clear.  . If the skin still seems dry or sensitive, you can try double-rinsing the clothes. . Avoid tight or scratchy clothing such as wool. . Do not use fabric softeners or dyer sheets.   Heartburn Heartburn is a type of pain or discomfort that can happen in the throat or chest. It is often described as a burning pain. It may also cause a bad, acid-like taste in the mouth. Heartburn may feel worse when you lie down or bend over. It may be worse at night. It may be caused by  stomach contents that move back up (reflux) into the tube that connects the mouth with the stomach (esophagus). Follow these instructions at home: Eating and drinking   Avoid certain foods and drinks as told by your doctor. This may include: ? Coffee and tea (with or without caffeine). ? Drinks that have alcohol. ? Energy drinks and sports drinks. ? Carbonated drinks or sodas. ? Chocolate and cocoa. ? Peppermint and mint flavorings. ? Garlic and onions. ? Horseradish. ? Spicy and acidic foods, such as:  Peppers.  Chili powder and curry powder.  Vinegar.  Hot sauces and BBQ sauce. ? Citrus fruit juices and citrus fruits, such as:  Oranges.  Lemons.  Limes. ? Tomato-based foods, such as:  Red sauce and pizza with red sauce.  Chili.  Salsa. ? Fried and fatty foods, such as:  Donuts.  [place] fries and potato chips.  High-fat dressings. ? High-fat meats, such as:  Hot dogs and sausage.  Rib eye steak.  Ham and bacon. ? High-fat dairy items, such as:  Whole milk.  Butter.  Cream cheese.  Eat small meals often. Avoid eating large meals.  Avoid drinking large amounts of liquid with your meals.  Avoid eating meals during the 2-3 hours before bedtime.  Avoid lying down right after you eat.  Do not exercise right after you eat. Lifestyle      If you are overweight, lose an amount of weight that is healthy for you. Ask your doctor about a safe weight loss goal.  Do not use any products that contain nicotine or tobacco, including cigarettes, e-cigarettes, and chewing tobacco. These can make your symptoms worse. If you need help quitting, ask your doctor.  Wear loose clothes. Do not wear anything tight around your waist.  Raise (elevate) the head of your bed about 6 inches (15 cm) when you sleep.  Try to lower your stress. If you need help doing this, ask your doctor. General instructions  Pay attention to any changes in your symptoms.  Take  over-the-counter and prescription medicines only as told by your doctor. ? Do not take aspirin, ibuprofen, or other NSAIDs unless your doctor says it is okay. ? Stop medicines only as told by your doctor.  Keep all follow-up visits as told by your doctor. This is important. Contact a doctor if:  You have new symptoms.  You lose weight and you do not know why it is happening.  You have trouble swallowing, or it hurts to swallow.  You have wheezing or a cough that keeps happening.  Your symptoms do not get better with treatment.  You have heartburn often for more than 2 weeks. Get help right away if:  You have pain in your arms, neck, jaw, teeth, or back.  You feel sweaty, dizzy, or light-headed.  You have chest pain or shortness of breath.  You throw up (vomit) and your throw up looks like blood or coffee grounds.  Your poop (stool) is bloody or black. These symptoms may represent a serious problem that is an emergency. Do not wait to see if the symptoms will go away. Get medical help right away. Call your local emergency services (911 in the [place] Do not drive yourself to the hospital. Summary  Heartburn is a type of pain that can happen in the throat or chest. It can feel like a burning pain. It may also cause a bad, acid-like taste in the mouth.  You may need to avoid certain foods and drinks to help your symptoms. Ask your doctor what foods and drinks you should avoid.  Take over-the-counter and prescription medicines only as told by your doctor. Do not take aspirin, ibuprofen, or other NSAIDs unless your doctor told you to do so.  Contact your doctor if your symptoms do not get better or they get worse. This information is not intended to replace advice given to you by your health care provider. Make sure you discuss any questions you have with your health care provider. Document Revised: 03/09/2018 Document Reviewed: 03/09/2018 Elsevier Patient Education  [place].

## 2020-09-06 NOTE — Assessment & Plan Note (Signed)
History of frequent URIs and had 6 antibiotics this year. Being followed by hematology for low WBC.  Marland Kitchen Keep track of infections. . Get bloodwork to look at immune system.

## 2020-09-06 NOTE — Assessment & Plan Note (Addendum)
Perennial rhino conjunctivitis symptoms for 30+ years. 3-4 sinus infections per year. Prior blood work was positive to dust mites, cat, dog, grass, cockroach, tree.  Borderline positive to ragweed and weed  Today's skin testing showed: Positive to grass, weed, ragweed, trees, mold, dust mites, cat, dog, cockroach. Negative control slightly positive as well. Results given.   Start environmental control measures as below.  May use over the counter antihistamines such as Zyrtec (cetirizine), Claritin (loratadine), Allegra (fexofenadine), or Xyzal (levocetirizine) daily as needed. May take twice a day during flares. Start dymista (fluticasone + azelastine nasal spray combination) 1 spray per nostril twice a day.  This replaces Flonase (fluticasone) for now. If it's not covered let us know.   Nasal saline spray (i.e., Simply Saline) or nasal saline lavage (i.e., NeilMed) is recommended as needed and prior to medicated nasal sprays.  Continue Singulair (montelukast) 10mg  daily at night.  If above regimen does not control symptoms, will discuss allergy immunotherapy next.

## 2020-09-06 NOTE — Assessment & Plan Note (Signed)
Patient concerned about getting booster vaccine as she had some throat tightness with her first [place] vaccine and had more severe symptoms with whole body flushing and throat dryness after her second [place] vaccine. She has history of having issues with her throat though and is being followed by ENT for this. History of anxiety. Flu vaccines cause similar throat discomfort but no other symptoms. Morphine causes itching and hypotension.   Given above clinical history, recommend getting COVID-19 component testing and depending on results will recommend which booster vaccine to get.  Okay to get flu vaccine as before.   Continue to avoid morphine.   Patient has Epinephrine on hand for allergic reactions.

## 2020-09-06 NOTE — Assessment & Plan Note (Signed)
Question if some of her throat sensation may be contributed by her reflux as well.  Continue with reflux medication (protonix) and lifestyle diet as below.

## 2020-09-06 NOTE — Assessment & Plan Note (Signed)
Noted increased rashes since off antihistamines. History of eczema in the past.  See below for proper skin care.

## 2020-09-06 NOTE — Assessment & Plan Note (Signed)
Diagnosed with asthma over 20 years ago. Currently on Advair 226mcg 2 puffs BID, Singulair daily and using albuterol as needed. Albuterol was causing palpitations but xopenex nebulizer caused throat tightness.  Today's spirometry was normal with no improvement in FEV1 post bronchodilator treatment. Clinically feeling the same. Interestingly patient was coughing after the treatment.  . Daily controller medication(s): Advair 219mcg 2 puffs twice a day with spacer and rinse mouth afterwards. Marland Kitchen Spacer given and demonstrated proper use with inhaler. Patient understood technique and all questions/concerned were addressed.  o Hopefully this will help decrease the coughing/throat sensations after using inhalers. . Continue Singulair 10mg  daily.  . May use albuterol rescue inhaler 2 puffs every 4 to 6 hours as needed for shortness of breath, chest tightness, coughing, and wheezing. May use albuterol rescue inhaler 2 puffs 5 to 15 minutes prior to strenuous physical activities. Monitor frequency of use.  . Repeat spirometry at next visit.

## 2020-09-07 ENCOUNTER — Ambulatory Visit (INDEPENDENT_AMBULATORY_CARE_PROVIDER_SITE_OTHER): Payer: Commercial Managed Care - PPO | Admitting: Obstetrics & Gynecology

## 2020-09-07 ENCOUNTER — Encounter: Payer: Self-pay | Admitting: Obstetrics & Gynecology

## 2020-09-07 ENCOUNTER — Other Ambulatory Visit: Payer: Self-pay | Admitting: Gastroenterology

## 2020-09-07 VITALS — BP 122/80 | Ht 69.0 in | Wt 300.0 lb

## 2020-09-07 DIAGNOSIS — Z01419 Encounter for gynecological examination (general) (routine) without abnormal findings: Secondary | ICD-10-CM | POA: Diagnosis not present

## 2020-09-07 NOTE — Patient Instructions (Signed)
Thank you for choosing [facility]. As part of our ongoing efforts to improve patient experience, we would appreciate your feedback. Please fill out the short survey that you will receive by mail or MyChart. Your opinion is important to us! -Dr Yizel Canby  

## 2020-09-07 NOTE — Progress Notes (Signed)
HPI:      Molly Stone is a 39 y.o. [phone] who LMP was Patient's last menstrual period was 08/18/2020., she presents today for her annual examination. The patient has no complaints today.  Periods are one day monthly.  Some pain at times.  The patient is sexually active. Her last pap: approximate date 2019 and was normal and last mammogram: approximate date 2021 and was normal. The patient does perform self breast exams.  There is no notable family history of breast or ovarian cancer in her family.  The patient has regular exercise: yes.  The patient denies current symptoms of depression.    GYN History: Contraception: vasectomy  PMHx: Past Medical History:  Diagnosis Date  . Allergy   . Anemia   . Anxiety   . Asthma   . Chlamydia    h/o  . DDD (degenerative disc disease), cervical 04/07/2019  . Disorder of vocal cord    spasmodic dysphonia  . Dysmenorrhea   . Dyspareunia, female   . Endometriosis   . Family history of endometriosis   . GERD (gastroesophageal reflux disease)   . Headache    migraine  . Heavy periods   . Hiatal hernia   . History of nephrolithiasis   . Hypertension   . Jaundice, physiologic, newborn   . Obesity (BMI 30-39.9)   . Sleep apnea   . Tobacco user   . Vaginal Pap smear, abnormal    Past Surgical History:  Procedure Laterality Date  . DILATATION & CURETTAGE/HYSTEROSCOPY WITH MYOSURE N/A 08/27/2018   Procedure: DILATATION & CURETTAGE/HYSTEROSCOPY WITH MINERVA;  Surgeon: Gae Dry, MD;  Location: [facility] ORS;  Service: Gynecology;  Laterality: N/A;  . DILATION AND CURETTAGE OF UTERUS    . ESOPHAGOGASTRODUODENOSCOPY (EGD) WITH PROPOFOL N/A 08/07/2018   Procedure: ESOPHAGOGASTRODUODENOSCOPY (EGD) WITH PROPOFOL;  Surgeon: Jonathon Bellows, MD;  Location: [facility] ENDOSCOPY;  Service: Gastroenterology;  Laterality: N/A;  . laparoscopy    . ROOT CANAL    . WISDOM TOOTH EXTRACTION     Family History  Problem Relation Age of Onset  .  Endometriosis Mother   . Allergic rhinitis Mother   . Cancer Father        benign liver CA  . Stroke Father        recurrent blood clots on the brain   . Lupus Father   . Cancer Maternal Grandmother 30       ovarian Ca, still surviving   . Osteoporosis Maternal Grandmother   . Ovarian cancer Maternal Grandmother   . Heart disease Maternal Grandfather   . Cancer Maternal Grandfather 90       colon CA. metastatic  . Diabetes Maternal Uncle   . Heart disease Paternal Grandmother   . CAD Brother 30       early at age 98  . Immunodeficiency Brother   . Breast cancer Cousin   . Colon cancer Neg Hx   . Kidney cancer Neg Hx   . Bladder Cancer Neg Hx   . Angioedema Neg Hx   . Atopy Neg Hx   . Asthma Neg Hx   . Eczema Neg Hx   . Urticaria Neg Hx    Social History   Tobacco Use  . Smoking status: Former Smoker    Years: 10.00    Types: Cigarettes    Quit date: 10/2016    Years since quitting: 3.8  . Smokeless tobacco: Never Used  Vaping Use  . Vaping Use:  Former  Substance Use Topics  . Alcohol use: Not Currently    Alcohol/week: 0.0 standard drinks  . Drug use: No    Current Outpatient Medications:  .  ADVAIR HFA 616-07 MCG/ACT inhaler, USE 2 INHALATIONS TWICE A DAY, Disp: 36 g, Rfl: 3 .  albuterol (PROAIR HFA) 108 (90 Base) MCG/ACT inhaler, Inhale 1-2 puffs into the lungs every 6 (six) hours as needed for wheezing or shortness of breath., Disp: 8 g, Rfl: 2 .  Azelastine-Fluticasone 137-50 MCG/ACT SUSP, Place 1 spray into the nose in the morning and at bedtime., Disp: 23 g, Rfl: 5 .  Bacillus Coagulans-Inulin (PROBIOTIC-PREBIOTIC PO), Take by mouth., Disp: , Rfl:  .  Cholecalciferol (VITAMIN D3) 125 MCG (5000 UT) TABS, Take 1 tablet by mouth daily. , Disp: , Rfl:  .  EPINEPHrine 0.3 mg/0.3 mL IJ SOAJ injection, See admin instructions. for allergic reaction, Disp: , Rfl:  .  famotidine (PEPCID) 20 MG tablet, Take 20 mg by mouth 2 (two) times daily. , Disp: , Rfl:  .   montelukast (SINGULAIR) 10 MG tablet, TAKE 1 TABLET AT BEDTIME, Disp: 90 tablet, Rfl: 3 .  Multiple Vitamin (MULTIVITAMIN) tablet, Take 1 tablet by mouth daily., Disp: , Rfl:  .  nebivolol (BYSTOLIC) 5 MG tablet, Take 1 tablet (5 mg total) by mouth daily., Disp: 90 tablet, Rfl: 3 .  pantoprazole (PROTONIX) 40 MG tablet, TAKE 1 TABLET TWICE A DAY, Disp: 180 tablet, Rfl: 1 .  vitamin B-12 (CYANOCOBALAMIN) 1000 MCG tablet, Take 1,000 mcg by mouth daily., Disp: , Rfl:  Allergies: Buprenorphine hcl, Morphine and related, Prednisone, and Tramadol hcl  Review of Systems  Constitutional: Negative for chills, fever and malaise/fatigue.  HENT: Negative for congestion, sinus pain and sore throat.   Eyes: Negative for blurred vision and pain.  Respiratory: Negative for cough and wheezing.   Cardiovascular: Negative for chest pain and leg swelling.  Gastrointestinal: Negative for abdominal pain, constipation, diarrhea, heartburn, nausea and vomiting.  Genitourinary: Negative for dysuria, frequency, hematuria and urgency.  Musculoskeletal: Negative for back pain, joint pain, myalgias and neck pain.  Skin: Negative for itching and rash.  Neurological: Negative for dizziness, tremors and weakness.  Endo/Heme/Allergies: Does not bruise/bleed easily.  Psychiatric/Behavioral: Negative for depression. The patient is not nervous/anxious and does not have insomnia.     Objective: BP 122/80   Ht 5\' 9"  (1.753 m)   Wt 300 lb (136.1 kg)   LMP 08/18/2020   BMI 44.30 kg/m   Filed Weights   09/07/20 1445  Weight: 300 lb (136.1 kg)   Body mass index is 44.3 kg/m. Physical Exam Constitutional:      General: She is not in acute distress.    Appearance: She is well-developed.  Genitourinary:     Pelvic exam was performed with patient supine.     Vagina, uterus and rectum normal.     No lesions in the vagina.     No vaginal bleeding.     No cervical motion tenderness, friability, lesion or polyp.      Uterus is mobile.     Uterus is not enlarged.     No uterine mass detected.    Uterus is midaxial.     No right or left adnexal mass present.     Right adnexa not tender.     Left adnexa not tender.  HENT:     Head: Normocephalic and atraumatic. No laceration.     Right Ear: Hearing normal.  Left Ear: Hearing normal.     Mouth/Throat:     Pharynx: Uvula midline.  Eyes:     Pupils: Pupils are equal, round, and reactive to light.  Neck:     Thyroid: No thyromegaly.  Cardiovascular:     Rate and Rhythm: Normal rate and regular rhythm.     Heart sounds: No murmur heard.  No friction rub. No gallop.   Pulmonary:     Effort: Pulmonary effort is normal. No respiratory distress.     Breath sounds: Normal breath sounds. No wheezing.  Abdominal:     General: Bowel sounds are normal. There is no distension.     Palpations: Abdomen is soft.     Tenderness: There is no abdominal tenderness. There is no rebound.  Musculoskeletal:        General: Normal range of motion.     Cervical back: Normal range of motion and neck supple.  Neurological:     Mental Status: She is alert and oriented to person, place, and time.     Cranial Nerves: No cranial nerve deficit.  Skin:    General: Skin is warm and dry.  Psychiatric:        Judgment: Judgment normal.  Vitals reviewed.     Assessment:  ANNUAL EXAM 1. Women's annual routine gynecological examination      Screening Plan:            1.  Cervical Screening-  Pap smear not done today Last PAP 2019 normal  2. Breast screening- Exam annually and mammogram>40 planned   3. Monitor periods and pain  4. Labs Declines  5. Counseling for contraception: vasectomy    F/U  Return in about 1 year (around 09/07/2021) for Annual.  Molly Applebaum, MD, [facility], [facility] 09/07/2020  3:19 PM

## 2020-09-08 ENCOUNTER — Ambulatory Visit (INDEPENDENT_AMBULATORY_CARE_PROVIDER_SITE_OTHER): Payer: Commercial Managed Care - PPO

## 2020-09-08 ENCOUNTER — Other Ambulatory Visit: Payer: Self-pay

## 2020-09-08 ENCOUNTER — Telehealth: Payer: Self-pay | Admitting: Allergy

## 2020-09-08 DIAGNOSIS — Z23 Encounter for immunization: Secondary | ICD-10-CM

## 2020-09-08 NOTE — Telephone Encounter (Signed)
Dr. Maudie Mercury I believe this is in regards to the [place]. Please advise on splitting the dosage.

## 2020-09-08 NOTE — Telephone Encounter (Signed)
Patient was prescribed azelastine on 09/06/20 and she said her insurance will not cover it. She has [facility]. Dr. Maudie Mercury told her to call us if it wasn't covered.  Her pharmacy is [facility] in [place].

## 2020-09-11 MED ORDER — AZELASTINE HCL 0.1 % NA SOLN
1.0000 | Freq: Two times a day (BID) | NASAL | 5 refills | Status: DC | PRN
Start: 2020-09-11 — End: 2020-09-19

## 2020-09-11 MED ORDER — FLUTICASONE PROPIONATE 50 MCG/ACT NA SUSP: 1.0000 | Freq: Two times a day (BID) | NASAL | 5 refills | Status: DC | PRN

## 2020-09-11 NOTE — Telephone Encounter (Signed)
May use Flonase (fluticasone) nasal spray 1 spray per nostril twice a day as needed for nasal congestion.   May use azelastine nasal spray 1-2 sprays per nostril twice a day as needed for runny nose/drainage.

## 2020-09-12 ENCOUNTER — Ambulatory Visit: Payer: Commercial Managed Care - PPO | Admitting: Internal Medicine

## 2020-09-12 ENCOUNTER — Encounter: Payer: Self-pay | Admitting: Internal Medicine

## 2020-09-12 ENCOUNTER — Other Ambulatory Visit: Payer: Self-pay

## 2020-09-12 DIAGNOSIS — F411 Generalized anxiety disorder: Secondary | ICD-10-CM | POA: Diagnosis not present

## 2020-09-12 NOTE — Progress Notes (Signed)
Subjective:  Patient ID: Molly Stone, female    DOB: Nov 10, 1980  Age: 39 y.o. MRN: [id number]  CC: Diagnoses of Morbid obesity ([place]) and Generalized anxiety disorder were pertinent to this visit.  HPI Molly Stone presents for FOLLOW UP ON WEIGHT GAIN/OBESITY  .  This visit occurred during the SARS-CoV-2 public health emergency.  Safety protocols were in place, including screening questions prior to the visit, additional usage of staff PPE, and extensive cleaning of exam room while observing appropriate contact time as indicated for disinfecting solutions.   She has had a difficult time losing weight since adolescence .  Reviewed previous attempts to lose weight .  She is currently considering a commercially available diet recommended by several friend that she has asked me to review and  render an opinion on , which I have done today during her visit .  2) she is having increased anxiety regarding the multiple tests that she has had to diagnose her presumed immunodeficiency.  She feels uncertain of the diagnosis and worried about its impact on her prognosis .  We reviewed her history and the recent lab tests     Outpatient Medications Prior to Visit  Medication Sig Dispense Refill  . ADVAIR HFA 230-21 MCG/ACT inhaler USE 2 INHALATIONS TWICE A DAY 36 g 3  . albuterol (PROAIR HFA) 108 (90 Base) MCG/ACT inhaler Inhale 1-2 puffs into the lungs every 6 (six) hours as needed for wheezing or shortness of breath. 8 g 2  . Bacillus Coagulans-Inulin (PROBIOTIC-PREBIOTIC PO) Take by mouth.    . Cholecalciferol (VITAMIN D3) 125 MCG (5000 UT) TABS Take 1 tablet by mouth daily.     . clindamycin-tretinoin (ZIANA) gel Apply 1 application topically daily.    Marland Kitchen EPINEPHrine 0.3 mg/0.3 mL IJ SOAJ injection See admin instructions. for allergic reaction    . famotidine (PEPCID) 20 MG tablet Take 20 mg by mouth 2 (two) times daily.     . fluticasone (FLONASE) 50 MCG/ACT nasal spray Place 1 spray into  both nostrils 2 (two) times daily as needed for allergies or rhinitis (nasal congestion). 16 g 5  . meloxicam (MOBIC) 15 MG tablet Take 1 tablet by mouth daily as needed.    . montelukast (SINGULAIR) 10 MG tablet TAKE 1 TABLET AT BEDTIME 90 tablet 3  . Multiple Vitamin (MULTIVITAMIN) tablet Take 1 tablet by mouth daily.    . nebivolol (BYSTOLIC) 5 MG tablet Take 1 tablet (5 mg total) by mouth daily. 90 tablet 3  . pantoprazole (PROTONIX) 40 MG tablet TAKE 1 TABLET TWICE A DAY 180 tablet 1  . vitamin B-12 (CYANOCOBALAMIN) 1000 MCG tablet Take 1,000 mcg by mouth daily.    Marland Kitchen azelastine (ASTELIN) 0.1 % nasal spray Place 1-2 sprays into both nostrils 2 (two) times daily as needed for rhinitis (nasal drainage). Use in each nostril as directed (Patient not taking: Reported on 09/12/2020) 30 mL 5  . Azelastine-Fluticasone 137-50 MCG/ACT SUSP Place 1 spray into the nose in the morning and at bedtime. (Patient not taking: Reported on 09/12/2020) 23 g 5   No facility-administered medications prior to visit.    Review of Systems;  Patient denies headache, fevers, malaise, unintentional weight loss, skin rash, eye pain, sinus congestion and sinus pain, sore throat, dysphagia,  hemoptysis , cough, dyspnea, wheezing, chest pain, palpitations, orthopnea, edema, abdominal pain, nausea, melena, diarrhea, constipation, flank pain, dysuria, hematuria, urinary  Frequency, nocturia, numbness, tingling, seizures,  Focal weakness, Loss of consciousness,  Tremor,  insomnia, depression, anxiety, and suicidal ideation.      Objective:  BP 112/74   Pulse (!) 101   Temp 99.2 F (37.3 C)   Ht 5\' 9"  (1.753 m)   Wt (!) 303 lb 3.2 oz (137.5 kg)   LMP 08/18/2020   SpO2 98%   BMI 44.77 kg/m   BP Readings from Last 3 Encounters:  09/12/20 112/74  09/07/20 122/80  09/06/20 118/74    Wt Readings from Last 3 Encounters:  09/12/20 (!) 303 lb 3.2 oz (137.5 kg)  09/07/20 300 lb (136.1 kg)  09/06/20 299 lb 6.4 oz (135.8  kg)    General appearance: alert, cooperative and appears stated age Ears: normal TM's and external ear canals both ears Throat: lips, mucosa, and tongue normal; teeth and gums normal Neck: no adenopathy, no carotid bruit, supple, symmetrical, trachea midline and thyroid not enlarged, symmetric, no tenderness/mass/nodules Back: symmetric, no curvature. ROM normal. No CVA tenderness. Lungs: clear to auscultation bilaterally Heart: regular rate and rhythm, S1, S2 normal, no murmur, click, rub or gallop Abdomen: soft, non-tender; bowel sounds normal; no masses,  no organomegaly Pulses: 2+ and symmetric Skin: Skin color, texture, turgor normal. No rashes or lesions Lymph nodes: Cervical, supraclavicular, and axillary nodes normal.  Lab Results  Component Value Date   HGBA1C 5.2 04/08/2019    Lab Results  Component Value Date   CREATININE 0.80 05/31/2020   CREATININE 0.80 02/07/2020   CREATININE 0.76 04/08/2019    Lab Results  Component Value Date   WBC 4.4 09/06/2020   HGB 14.7 09/06/2020   HCT 42.0 09/06/2020   PLT 184 09/06/2020   GLUCOSE 88 05/31/2020   CHOL 205 (H) 09/02/2019   TRIG 57.0 09/02/2019   HDL 43.90 09/02/2019   LDLDIRECT 155 (H) 08/18/2015   LDLCALC 149 (H) 09/02/2019   ALT 17 05/31/2020   AST 18 05/31/2020   NA 140 05/31/2020   K 4.1 05/31/2020   CL 106 05/31/2020   CREATININE 0.80 05/31/2020   BUN 12 05/31/2020   CO2 27 05/31/2020   TSH 0.78 02/07/2020   HGBA1C 5.2 04/08/2019    MM 3D SCREEN BREAST BILATERAL  Result Date: 06/05/2020 CLINICAL DATA:  Screening. EXAM: DIGITAL SCREENING BILATERAL MAMMOGRAM WITH TOMO AND CAD COMPARISON:  Previous exam(s). ACR Breast Density Category a: The breast tissue is almost entirely fatty. FINDINGS: There are no findings suspicious for malignancy. Images were processed with CAD. IMPRESSION: No mammographic evidence of malignancy. A result letter of this screening mammogram will be mailed directly to the patient.  RECOMMENDATION: Screening mammogram at age 77. (Code:SM-B-40A) BI-RADS CATEGORY  1: Negative. Electronically Signed   By: Ammie Ferrier M.D.   On: 06/05/2020 16:20    Assessment & Plan:   Problem List Items Addressed This Visit      Unprioritized   Morbid obesity ([place])    I reviewed the commercial diet she presented .it seems to be a reasonable vegetable based diet that is gluten free, preservative and sugar free and makes claims to heal her immune problems.  The menus seems labor intensive but varied.        Generalized anxiety disorder    Her symptoms have been aggravated by the uncertainty of her diagnosis ,  But she declines medication to manage her fear         I am having Nadja M. Ruppe maintain her Vitamin D3, famotidine, montelukast, albuterol, nebivolol, vitamin B-12, multivitamin, EPINEPHrine, Advair HFA, Bacillus Coagulans-Inulin (PROBIOTIC-PREBIOTIC PO), Azelastine-Fluticasone, pantoprazole,  azelastine, fluticasone, meloxicam, and clindamycin-tretinoin.  No orders of the defined types were placed in this encounter.   There are no discontinued medications.  Follow-up: No follow-ups on file.   Crecencio Mc, MD

## 2020-09-13 NOTE — Assessment & Plan Note (Signed)
I reviewed the commercial diet she presented .it seems to be a reasonable vegetable based diet that is gluten free, preservative and sugar free and makes claims to heal her immune problems.  The menus seems labor intensive but varied.

## 2020-09-13 NOTE — Assessment & Plan Note (Signed)
Her symptoms have been aggravated by the uncertainty of her diagnosis ,  But she declines medication to manage her fear

## 2020-09-15 LAB — CBC WITH DIFFERENTIAL/PLATELET
Basophils Absolute: 0 10*3/uL (ref 0.0–0.2)
Basos: 1 %
EOS (ABSOLUTE): 0.1 10*3/uL (ref 0.0–0.4)
Eos: 3 %
Hematocrit: 42 % (ref 34.0–46.6)
Hemoglobin: 14.7 g/dL (ref 11.1–15.9)
Immature Grans (Abs): 0 10*3/uL (ref 0.0–0.1)
Immature Granulocytes: 0 %
Lymphocytes Absolute: 1.6 10*3/uL (ref 0.7–3.1)
Lymphs: 36 %
MCH: 31.5 pg (ref 26.6–33.0)
MCHC: 35 g/dL (ref 31.5–35.7)
MCV: 90 fL (ref 79–97)
Monocytes Absolute: 0.5 10*3/uL (ref 0.1–0.9)
Monocytes: 10 %
Neutrophils Absolute: 2.2 10*3/uL (ref 1.4–7.0)
Neutrophils: 50 %
Platelets: 184 10*3/uL (ref 150–450)
RBC: 4.66 x10E6/uL (ref 3.77–5.28)
RDW: 12.2 % (ref 11.7–15.4)
WBC: 4.4 10*3/uL (ref 3.4–10.8)

## 2020-09-15 LAB — STREP PNEUMONIAE 23 SEROTYPES IGG
Pneumo Ab Type 1*: 1 ug/mL — ABNORMAL LOW (ref >1.3)
Pneumo Ab Type 12 (12F)*: <0.1 ug/mL — ABNORMAL LOW (ref >1.3)
Pneumo Ab Type 14*: 0.8 ug/mL — ABNORMAL LOW (ref >1.3)
Pneumo Ab Type 17 (17F)*: 0.4 ug/mL — ABNORMAL LOW (ref >1.3)
Pneumo Ab Type 19 (19F)*: 1.9 ug/mL (ref >1.3)
Pneumo Ab Type 2*: 5.8 ug/mL (ref >1.3)
Pneumo Ab Type 20*: 2.5 ug/mL (ref >1.3)
Pneumo Ab Type 22 (22F)*: 2.9 ug/mL (ref >1.3)
Pneumo Ab Type 23 (23F)*: 0.3 ug/mL — ABNORMAL LOW (ref >1.3)
Pneumo Ab Type 26 (6B)*: 0.7 ug/mL — ABNORMAL LOW (ref >1.3)
Pneumo Ab Type 3*: 6.1 ug/mL (ref >1.3)
Pneumo Ab Type 34 (10A)*: 0.9 ug/mL — ABNORMAL LOW (ref >1.3)
Pneumo Ab Type 4*: 0.9 ug/mL — ABNORMAL LOW (ref >1.3)
Pneumo Ab Type 43 (11A)*: 1 ug/mL — ABNORMAL LOW (ref >1.3)
Pneumo Ab Type 5*: 0.9 ug/mL — ABNORMAL LOW (ref >1.3)
Pneumo Ab Type 51 (7F)*: 1.8 ug/mL (ref >1.3)
Pneumo Ab Type 54 (15B)*: 0.8 ug/mL — ABNORMAL LOW (ref >1.3)
Pneumo Ab Type 56 (18C)*: 1 ug/mL — ABNORMAL LOW (ref >1.3)
Pneumo Ab Type 57 (19A)*: 3.7 ug/mL (ref >1.3)
Pneumo Ab Type 68 (9V)*: 0.6 ug/mL — ABNORMAL LOW (ref >1.3)
Pneumo Ab Type 70 (33F)*: 0.3 ug/mL — ABNORMAL LOW (ref >1.3)
Pneumo Ab Type 8*: 0.4 ug/mL — ABNORMAL LOW (ref >1.3)
Pneumo Ab Type 9 (9N)*: 0.1 ug/mL — ABNORMAL LOW (ref >1.3)

## 2020-09-15 LAB — CHRONIC URTICARIA: cu index: 1.5 (ref <10)

## 2020-09-15 LAB — ALPHA-GAL PANEL
Alpha Gal IgE*: <0.1 kU/L (ref <0.10)
Beef (Bos spp) IgE: <0.1 kU/L (ref <0.35)
Class Interpretation: 0
Class Interpretation: 0
Class Interpretation: 0
Lamb/Mutton (Ovis spp) IgE: <0.1 kU/L (ref <0.35)
Pork (Sus spp) IgE: <0.1 kU/L (ref <0.35)

## 2020-09-15 LAB — COMPLEMENT, TOTAL: Compl, Total (CH50): >60 U/mL (ref >41)

## 2020-09-15 LAB — DIPHTHERIA / TETANUS ANTIBODY PANEL
Diphtheria Ab: 0.51 IU/mL (ref <0.10)
Tetanus Ab, IgG: 1.44 IU/mL (ref <0.10)

## 2020-09-15 LAB — TRYPTASE: Tryptase: 5.4 ug/L (ref 2.2–13.2)

## 2020-09-18 ENCOUNTER — Other Ambulatory Visit: Payer: Self-pay | Admitting: Allergy

## 2020-09-18 ENCOUNTER — Telehealth: Payer: Self-pay

## 2020-09-18 DIAGNOSIS — Z8709 Personal history of other diseases of the respiratory system: Secondary | ICD-10-CM

## 2020-09-18 NOTE — Telephone Encounter (Signed)
Pt said Dr. Maudie Mercury told her she needs to get a pneumonia vaccine? She said she was told after she gets it she will have to repeat her blood work in 4 weeks with.

## 2020-09-18 NOTE — Telephone Encounter (Signed)
Which pneumonia vaccine would she need?

## 2020-09-18 NOTE — Telephone Encounter (Signed)
Spoke with pt and appt has been scheduled.

## 2020-09-18 NOTE — Progress Notes (Signed)
Labs were mailed out today to be sent to patient home address listed in her chart.

## 2020-09-18 NOTE — Progress Notes (Signed)
Please mail lab orders to patient home. Thank you.

## 2020-09-18 NOTE — Telephone Encounter (Signed)
She should receive the PCV 13 first,   Followed by the PCV 23 at least  8 weeks later   I have no idea what "bloodwork" she was told to get

## 2020-09-19 ENCOUNTER — Other Ambulatory Visit: Payer: Self-pay | Admitting: *Deleted

## 2020-09-19 ENCOUNTER — Other Ambulatory Visit: Payer: Self-pay | Admitting: Internal Medicine

## 2020-09-19 ENCOUNTER — Encounter: Payer: Self-pay | Admitting: Allergy

## 2020-09-19 MED ORDER — FLUTICASONE PROPIONATE 50 MCG/ACT NA SUSP: 1.0000 | Freq: Two times a day (BID) | NASAL | 1 refills | Status: DC | PRN

## 2020-09-19 MED ORDER — AZELASTINE HCL 0.1 % NA SOLN
1.0000 | Freq: Two times a day (BID) | NASAL | 1 refills | Status: DC | PRN
Start: 2020-09-19 — End: 2021-08-31

## 2020-09-21 ENCOUNTER — Ambulatory Visit: Payer: Commercial Managed Care - PPO

## 2020-09-21 ENCOUNTER — Other Ambulatory Visit: Payer: Self-pay | Admitting: *Deleted

## 2020-09-27 NOTE — Progress Notes (Signed)
Date:  10/02/2020   ID:  Molly Stone, DOB 04-May-1981, MRN [id number]  Patient Location:  [place] [id number]   Provider location:   [facility], [place] office  PCP:  Crecencio Mc, MD  Cardiologist:  Arvid Right [facility]  Chief Complaint  Patient presents with  . Follow-up    12 month F/U; No new cardiac concerns. Meds verbally reviewed with patient.    History of Present Illness:    Molly Stone is a 39 y.o. female past medical history of Atrial tachycardia/paroxysmal tachycardia associated with alcohol intake, walking up steps in [place] hypertension.  obesity OSA  Not on CPAP Who presents for follow-up of her tachycardia/palpitations  Last seen in clinic July 2020 Tele med 09/2019   Feb 2021: no energy,  Drop in oxygen, Around then low WBC  Had vaccine x2 earlier in year  Low WBC in 2 range  On bystolic daily 3 episodes of tachycardia, eventually calmed down sensitivity to heat  Not needing prorpanolol  11/2018 ill, viral, may have exacerbated symptoms Taking care of several teenagers  EKG personally reviewed by myself on todays visit NSR rate 89 bpm, no st or t wave    Event monitor, Normal sinus rhythm Avg HR of 92 bpm.  1 run of Ventricular Tachycardia occurred lasting 16 beats with a max rate of 203 bpm (avg 181 bpm).  1 run of Supraventricular Tachycardiaoccurred lasting 4 beats with a max rate of 158 bpm (avg 144 bpm).  Isolated SVEs were rare (<1.0%), SVE Triplets were rare (<1.0%), and no SVE Couplets were present. Isolated VEs were rare (<1.0%), and no VE Couplets or VE Triplets were present. Patient triggered events were not associated with significant arrhythmia  Echocardiogram confirming ejection fraction greater than 55%   Previously with panic attacks Earlier in 2020 had bronchitis,   husband who working out of state in virus area  Over the summer 2019, After  eating out with alcoholic  drink, Developed faster heart rate, 146 bpm, would not slow down, Diaphoretic, 1 1/2 hours for it to go away Eventually resolved on its own  Second time, again had a alcoholic drink, whisky, at a party Developed tachycardia again  Additional episode of tachycardia when walking in [place] It was hot, walking up the [facility] steps Had tachycardia had to sit down and recover    Prior CV studies:   The following studies were reviewed today:    Past Medical History:  Diagnosis Date  . Allergy   . Anemia   . Anxiety   . Asthma   . Chlamydia    h/o  . DDD (degenerative disc disease), cervical 04/07/2019  . Disorder of vocal cord    spasmodic dysphonia  . Dysmenorrhea   . Dyspareunia, female   . Endometriosis   . Family history of endometriosis   . GERD (gastroesophageal reflux disease)   . Headache    migraine  . Heavy periods   . Hiatal hernia   . History of nephrolithiasis   . Hypertension   . Jaundice, physiologic, newborn   . Obesity (BMI 30-39.9)   . Sleep apnea   . Tobacco user   . Vaginal Pap smear, abnormal    Past Surgical History:  Procedure Laterality Date  . BONE MARROW BIOPSY    . DILATATION & CURETTAGE/HYSTEROSCOPY WITH MYOSURE N/A 08/27/2018   Procedure: DILATATION & CURETTAGE/HYSTEROSCOPY WITH MINERVA;  Surgeon: Kenton Kingfisher,  Linton Ham, MD;  Location: [facility] ORS;  Service: Gynecology;  Laterality: N/A;  . DILATION AND CURETTAGE OF UTERUS    . ESOPHAGOGASTRODUODENOSCOPY (EGD) WITH PROPOFOL N/A 08/07/2018   Procedure: ESOPHAGOGASTRODUODENOSCOPY (EGD) WITH PROPOFOL;  Surgeon: Jonathon Bellows, MD;  Location: [facility] ENDOSCOPY;  Service: Gastroenterology;  Laterality: N/A;  . laparoscopy    . ROOT CANAL    . WISDOM TOOTH EXTRACTION        Allergies:   Buprenorphine hcl, Morphine and related, Prednisone, and Tramadol hcl   Social History   Tobacco Use  . Smoking status: Former Smoker    Years: 10.00    Types: Cigarettes     Quit date: 10/2016    Years since quitting: 3.9  . Smokeless tobacco: Never Used  Vaping Use  . Vaping Use: Former  Substance Use Topics  . Alcohol use: Not Currently    Alcohol/week: 0.0 standard drinks  . Drug use: No     Current Outpatient Medications on File Prior to Visit  Medication Sig Dispense Refill  . ADVAIR HFA 230-21 MCG/ACT inhaler USE 2 INHALATIONS TWICE A DAY 36 g 3  . albuterol (VENTOLIN HFA) 108 (90 Base) MCG/ACT inhaler USE 2 INHALATIONS EVERY 6 HOURS AS NEEDED FOR WHEEZING OR SHORTNESS OF BREATH 8.5 g 10  . azelastine (ASTELIN) 0.1 % nasal spray Place 1-2 sprays into both nostrils 2 (two) times daily as needed for rhinitis (nasal drainage). Use in each nostril as directed 90 mL 1  . Azelastine-Fluticasone 137-50 MCG/ACT SUSP Place 1 spray into the nose in the morning and at bedtime. 23 g 5  . Bacillus Coagulans-Inulin (PROBIOTIC-PREBIOTIC PO) Take by mouth.    . Cholecalciferol (VITAMIN D3) 125 MCG (5000 UT) TABS Take 1 tablet by mouth daily.     . clindamycin-tretinoin (ZIANA) gel Apply 1 application topically daily.    Marland Kitchen EPINEPHrine 0.3 mg/0.3 mL IJ SOAJ injection See admin instructions. for allergic reaction    . famotidine (PEPCID) 20 MG tablet Take 20 mg by mouth 2 (two) times daily.     . fluticasone (FLONASE) 50 MCG/ACT nasal spray Place 1 spray into both nostrils 2 (two) times daily as needed for allergies or rhinitis (nasal congestion). 48 g 1  . meloxicam (MOBIC) 15 MG tablet Take 1 tablet by mouth daily as needed.    . montelukast (SINGULAIR) 10 MG tablet TAKE 1 TABLET AT BEDTIME 90 tablet 3  . nebivolol (BYSTOLIC) 5 MG tablet Take 1 tablet (5 mg total) by mouth daily. 90 tablet 3  . pantoprazole (PROTONIX) 40 MG tablet TAKE 1 TABLET TWICE A DAY 180 tablet 1  . vitamin B-12 (CYANOCOBALAMIN) 1000 MCG tablet Take 1,000 mcg by mouth daily.     No current facility-administered medications on file prior to visit.     Family Hx: The patient's family history  includes Allergic rhinitis in her mother; Breast cancer in her cousin; CAD (age of onset: 64) in her brother; Cancer in her father; Cancer (age of onset: 49) in her maternal grandmother; Cancer (age of onset: 28) in her maternal grandfather; Diabetes in her maternal uncle; Endometriosis in her mother; Heart disease in her maternal grandfather and paternal grandmother; Immunodeficiency in her brother; Lupus in her father; Osteoporosis in her maternal grandmother; Ovarian cancer in her maternal grandmother; Stroke in her father. There is no history of Colon cancer, Kidney cancer, Bladder Cancer, Angioedema, Atopy, Asthma, Eczema, or Urticaria.  ROS:   Please see the history of present illness.  Review of Systems  Constitutional: Negative.   HENT: Negative.   Respiratory: Negative.   Cardiovascular: Negative.   Gastrointestinal: Negative.   Musculoskeletal: Negative.   Neurological: Negative.   Psychiatric/Behavioral: Negative.   All other systems reviewed and are negative.    Labs/Other Tests and Data Reviewed:    Recent Labs: 02/07/2020: TSH 0.78 05/31/2020: ALT 17; BUN 12; Creatinine, Ser 0.80; Potassium 4.1; Sodium 140 09/06/2020: Hemoglobin 14.7; Platelets 184   Recent Lipid Panel Lab Results  Component Value Date/Time   CHOL 205 (H) 09/02/2019 09:19 AM   CHOL 210 (H) 04/08/2019 09:49 AM   TRIG 57.0 09/02/2019 09:19 AM   HDL 43.90 09/02/2019 09:19 AM   HDL 46 04/08/2019 09:49 AM   CHOLHDL 5 09/02/2019 09:19 AM   LDLCALC 149 (H) 09/02/2019 09:19 AM   LDLCALC 151 (H) 04/08/2019 09:49 AM   LDLDIRECT 155 (H) 08/18/2015 03:11 PM    Wt Readings from Last 3 Encounters:  10/02/20 (!) 302 lb (137 kg)  09/12/20 (!) 303 lb 3.2 oz (137.5 kg)  09/07/20 300 lb (136.1 kg)     Exam:    BP 120/82 (BP Location: Left Arm, Patient Position: Sitting, Cuff Size: Large)   Pulse 89   Ht '5\' 9"'  (1.753 m)   Wt (!) 302 lb (137 kg)   SpO2 98%   BMI 44.60 kg/m   Constitutional:  oriented to  person, place, and time. No distress.  HENT:  Head: Grossly normal Eyes:  no discharge. No scleral icterus.  Neck: No JVD, no carotid bruits  Cardiovascular: Regular rate and rhythm, no murmurs appreciated Pulmonary/Chest: Clear to auscultation bilaterally, no wheezes or rails Abdominal: Soft.  no distension.  no tenderness.  Musculoskeletal: Normal range of motion Neurological:  normal muscle tone. Coordination normal. No atrophy Skin: Skin warm and dry Psychiatric: normal affect, pleasant   ASSESSMENT & PLAN:    Problem List Items Addressed This Visit      Cardiology Problems   Paroxysmal tachycardia ([place]) - Primary   Relevant Orders   EKG 12-Lead   Hyperlipidemia     Other   Morbid obesity (HCC)   OSA (obstructive sleep apnea)    Other Visit Diagnoses    SVT (supraventricular tachycardia) (HCC)       Relevant Orders   EKG 12-Lead     Paroxysmal tachycardia Well-controlled on bystolic 5 mg daily propranolol 10 as needed  Hyperlipidemia Elevated LDL, she reports it is familial Discussed screening studies down the road such as CT coronary calcium scoring  Go back on diet, walking  Obesity We have encouraged continued exercise, careful diet management in an effort to lose weight.    Total encounter time more than 25 minutes  Greater than 50% was spent in counseling and coordination of care with the patient   Signed, Ida Rogue, [place] [facility] [address], [place], [place] [address]

## 2020-09-28 ENCOUNTER — Ambulatory Visit (INDEPENDENT_AMBULATORY_CARE_PROVIDER_SITE_OTHER): Payer: Commercial Managed Care - PPO

## 2020-09-28 ENCOUNTER — Other Ambulatory Visit: Payer: Self-pay

## 2020-09-28 DIAGNOSIS — Z23 Encounter for immunization: Secondary | ICD-10-CM | POA: Diagnosis not present

## 2020-09-28 NOTE — Progress Notes (Signed)
Patient presented for prevnar 13 injection to left deltoid, patient voiced no concerns nor showed any signs of distress during injection.

## 2020-10-02 ENCOUNTER — Other Ambulatory Visit: Payer: Self-pay

## 2020-10-02 ENCOUNTER — Ambulatory Visit: Payer: Commercial Managed Care - PPO | Admitting: Cardiovascular Disease

## 2020-10-02 ENCOUNTER — Encounter: Payer: Self-pay | Admitting: Cardiovascular Disease

## 2020-10-02 ENCOUNTER — Telehealth: Payer: Self-pay

## 2020-10-02 VITALS — BP 120/82 | HR 89 | Ht 69.0 in | Wt 302.0 lb

## 2020-10-02 DIAGNOSIS — I471 Supraventricular tachycardia: Secondary | ICD-10-CM | POA: Diagnosis not present

## 2020-10-02 DIAGNOSIS — I479 Paroxysmal tachycardia, unspecified: Secondary | ICD-10-CM | POA: Diagnosis not present

## 2020-10-02 DIAGNOSIS — E782 Mixed hyperlipidemia: Secondary | ICD-10-CM

## 2020-10-02 DIAGNOSIS — G4733 Obstructive sleep apnea (adult) (pediatric): Secondary | ICD-10-CM

## 2020-10-02 MED ORDER — PROPRANOLOL HCL 10 MG PO TABS: 10.0000 mg | ORAL_TABLET | Freq: Three times a day (TID) | ORAL | 1 refills | Status: DC

## 2020-10-02 NOTE — Patient Instructions (Addendum)
Medication Instructions:  propranolol 10 TID (3 times as day) as needed for  Tachycardia (racing heart rate)   If you need a refill on your cardiac medications before your next appointment, please call your pharmacy.    Lab work: No new labs needed   If you have labs (blood work) drawn today and your tests are completely normal, you will receive your results only by: Marland Kitchen MyChart Message (if you have MyChart) OR . A paper copy in the mail If you have any lab test that is abnormal or we need to change your treatment, we will call you to review the results.   Testing/Procedures: No new testing needed   Follow-Up: At [facility], you and your health needs are our priority.  As part of our continuing mission to provide you with exceptional heart care, we have created designated Provider Care Teams.  These Care Teams include your primary Cardiologist (physician) and Advanced Practice Providers (APPs -  Physician Assistants and Nurse Practitioners) who all work together to provide you with the care you need, when you need it.  . You will need a follow up appointment in 12 months  . Providers on your designated Care Team:   . Murray Hodgkins, NP . Christell Faith, PA-C . Marrianne Mood, PA-C  Any Other Special Instructions Will Be Listed Below (If Applicable).  COVID-19 Vaccine Information can be found at: [email or web address].uk For questions related to vaccine distribution or appointments, please email [email or web address] or call [phone].

## 2020-10-02 NOTE — Telephone Encounter (Signed)
Called and spoke to patient and she did agree to do Covid component testing with [facility] on the 23 of December. Patient was informed of the address and phone number and to be off of all antihistamines 3 days prior to appointment. Patient verbally agreed.

## 2020-10-02 NOTE — Telephone Encounter (Signed)
Patient called to see what booster Dr Maudie Mercury recommends her to get. Patient states her care team is highly recommending her to get one.   Please Advise.

## 2020-10-02 NOTE — Telephone Encounter (Signed)
Please call patient. According to our last note she was supposed to component testing done before we make recommendations.   Please have her schedule. No antihistamines for 3 days before. Must be healthy and not sick.   We have opening in [facility] with [facility] on 12/23 if interested.    Given above clinical history, recommend getting COVID-19 component testing and depending on results will recommend which booster vaccine to get.

## 2020-10-03 ENCOUNTER — Inpatient Hospital Stay: Payer: Commercial Managed Care - PPO | Attending: Oncology

## 2020-10-03 ENCOUNTER — Encounter: Payer: Self-pay | Admitting: Oncology

## 2020-10-03 ENCOUNTER — Inpatient Hospital Stay (HOSPITAL_BASED_OUTPATIENT_CLINIC_OR_DEPARTMENT_OTHER): Payer: Commercial Managed Care - PPO | Admitting: Oncology

## 2020-10-03 VITALS — BP 118/89 | HR 91 | Temp 98.6°F | Resp 20 | Wt 302.1 lb

## 2020-10-03 DIAGNOSIS — E669 Obesity, unspecified: Secondary | ICD-10-CM | POA: Insufficient documentation

## 2020-10-03 DIAGNOSIS — K219 Gastro-esophageal reflux disease without esophagitis: Secondary | ICD-10-CM | POA: Insufficient documentation

## 2020-10-03 DIAGNOSIS — J45909 Unspecified asthma, uncomplicated: Secondary | ICD-10-CM | POA: Insufficient documentation

## 2020-10-03 DIAGNOSIS — Z79899 Other long term (current) drug therapy: Secondary | ICD-10-CM | POA: Insufficient documentation

## 2020-10-03 DIAGNOSIS — I10 Essential (primary) hypertension: Secondary | ICD-10-CM | POA: Insufficient documentation

## 2020-10-03 DIAGNOSIS — D709 Neutropenia, unspecified: Secondary | ICD-10-CM

## 2020-10-03 DIAGNOSIS — G4730 Sleep apnea, unspecified: Secondary | ICD-10-CM | POA: Diagnosis not present

## 2020-10-03 DIAGNOSIS — D708 Other neutropenia: Secondary | ICD-10-CM

## 2020-10-03 LAB — CBC WITH DIFFERENTIAL/PLATELET
Abs Immature Granulocytes: 0 10*3/uL (ref 0.00–0.07)
Basophils Absolute: 0 10*3/uL (ref 0.0–0.1)
Basophils Relative: 1 %
Eosinophils Absolute: 0.2 10*3/uL (ref 0.0–0.5)
Eosinophils Relative: 6 %
HCT: 41.8 % (ref 36.0–46.0)
Hemoglobin: 13.7 g/dL (ref 12.0–15.0)
Immature Granulocytes: 0 %
Lymphocytes Relative: 46 %
Lymphs Abs: 1.2 10*3/uL (ref 0.7–4.0)
MCH: 29.8 pg (ref 26.0–34.0)
MCHC: 32.8 g/dL (ref 30.0–36.0)
MCV: 90.9 fL (ref 80.0–100.0)
Monocytes Absolute: 0.5 10*3/uL (ref 0.1–1.0)
Monocytes Relative: 19 %
Neutro Abs: 0.8 10*3/uL — ABNORMAL LOW (ref 1.7–7.7)
Neutrophils Relative %: 28 %
Platelets: 155 10*3/uL (ref 150–400)
RBC: 4.6 MIL/uL (ref 3.87–5.11)
RDW: 12.7 % (ref 11.5–15.5)
WBC: 2.7 10*3/uL — ABNORMAL LOW (ref 4.0–10.5)
nRBC: 0 % (ref 0.0–0.2)

## 2020-10-03 LAB — TSH: TSH: 1.587 u[IU]/mL (ref 0.350–4.500)

## 2020-10-03 NOTE — Progress Notes (Signed)
Hematology/Oncology Consult note [facility]  [phone] [phone]  Patient Care Team: Crecencio Mc, MD as PCP - General (Internal Medicine) Sindy Guadeloupe, MD as Consulting Physician (Hematology and Oncology)   Name of the patient: Molly Stone  [id number]  [birth year]   Date of visit: 10/03/20  Diagnosis-chronic idiopathic neutropenia  Chief complaint/ Reason for visit-routine follow-up of neutropenia  Heme/Onc history: patient is a 39 year old female with a past medical history significant for spinal stenosis, obstructive sleep apnea referred for leukopenia/neutropenia.She has been feeling poorly for the last 1 month and reports having significant fatigue. Reports occasional chest pain which lasts for a few minutes and then resolves on its own. Denies any fever. Appetite and weight have remained stable.Denies any history of autoimmune disorders. Looking back at her prior CBCs.Patient has had a normal white cell count with a normal differential up until 2019 when she was noted to have leukopenia for the first time in November 2019. On 09/11/2018 her white count was 3.1 with an [place] of 1. Following that her white cell count normalized but was again found to be low in June 2020 when her white count was 3.2 with an ANC of 0.9. ANC improved to 1.3 in September 2020 but since then her Gem has been trending down to 800 in April 2021 and 700 on 03/09/2020. Hemoglobin is always remained stable between 13-14 and platelets have been normal. She had some initial work-up done by Dr. Derrel Nip and was found to have borderline low B12 levels of 287. Copper levels and folate was normal. Iron studies were within normal limits. She also had HIV testing and hep B and hep C testing which was all negative.  Results of blood work from 03/27/2020 were as follows: CBC showed white count of 2.4, H&H of 14.1/41.7 and a platelet count of 191. ANC was low at  500. Flow cytometry was unable to evaluate B-cell for allergy due to nonspecific light chain binding. ANA comprehensive panel was negative. Pathology smear review showed relative neutropenia. Normal erythrocytes and normal platelets.  Bone marrow biopsy showed normocellular marrow with trilineage hematopoiesis including abundant granulocytic cells and mature neutrophils with nonspecific changes. No increase in blastic cells. No evidence of lymphoproliferative disorder. In the setting peripheral neutropenia may be related to medication immune mediated process flow cytometry on the marrow was normal.    Interval history-patient reports having 4-6 episodes of bronchitis every year.  She was also found to have multiple allergies and was recently started on allergy medications as well.  Denies any fever or hospitalizations.  She does have chronic fatigue and feels that her energy levels are somewhat better after she started taking oral B12.  ECOG PS- 0 Pain scale- 0   Review of systems- Review of Systems  Constitutional: Positive for malaise/fatigue. Negative for chills, fever and weight loss.  HENT: Negative for congestion, ear discharge and nosebleeds.   Eyes: Negative for blurred vision.  Respiratory: Negative for cough, hemoptysis, sputum production, shortness of breath and wheezing.   Cardiovascular: Negative for chest pain, palpitations, orthopnea and claudication.  Gastrointestinal: Negative for abdominal pain, blood in stool, constipation, diarrhea, heartburn, melena, nausea and vomiting.  Genitourinary: Negative for dysuria, flank pain, frequency, hematuria and urgency.  Musculoskeletal: Negative for back pain, joint pain and myalgias.  Skin: Negative for rash.  Neurological: Negative for dizziness, tingling, focal weakness, seizures, weakness and headaches.  Endo/Heme/Allergies: Does not bruise/bleed easily.  Psychiatric/Behavioral: Negative for depression and suicidal  ideas.  The patient does not have insomnia.       Allergies  Allergen Reactions  . Buprenorphine Hcl Itching and Other (See Comments)    And shaking  . Morphine And Related Itching and Other (See Comments)    And shaking  . Prednisone Anxiety and Palpitations  . Tramadol Hcl Other (See Comments)    anxious     Past Medical History:  Diagnosis Date  . Allergy   . Anemia   . Anxiety   . Asthma   . Chlamydia    h/o  . DDD (degenerative disc disease), cervical 04/07/2019  . Disorder of vocal cord    spasmodic dysphonia  . Dysmenorrhea   . Dyspareunia, female   . Endometriosis   . Family history of endometriosis   . GERD (gastroesophageal reflux disease)   . Headache    migraine  . Heavy periods   . Hiatal hernia   . History of nephrolithiasis   . Hypertension   . Jaundice, physiologic, newborn   . Obesity (BMI 30-39.9)   . Sleep apnea   . Tobacco user   . Vaginal Pap smear, abnormal      Past Surgical History:  Procedure Laterality Date  . BONE MARROW BIOPSY    . DILATATION & CURETTAGE/HYSTEROSCOPY WITH MYOSURE N/A 08/27/2018   Procedure: DILATATION & CURETTAGE/HYSTEROSCOPY WITH MINERVA;  Surgeon: Gae Dry, MD;  Location: [facility] ORS;  Service: Gynecology;  Laterality: N/A;  . DILATION AND CURETTAGE OF UTERUS    . ESOPHAGOGASTRODUODENOSCOPY (EGD) WITH PROPOFOL N/A 08/07/2018   Procedure: ESOPHAGOGASTRODUODENOSCOPY (EGD) WITH PROPOFOL;  Surgeon: Jonathon Bellows, MD;  Location: [facility] ENDOSCOPY;  Service: Gastroenterology;  Laterality: N/A;  . laparoscopy    . ROOT CANAL    . WISDOM TOOTH EXTRACTION      Social History   Socioeconomic History  . Marital status: Married    Spouse name: Not on file  . Number of children: Not on file  . Years of education: Not on file  . Highest education level: Not on file  Occupational History  . Not on file  Tobacco Use  . Smoking status: Former Smoker    Years: 10.00    Types: Cigarettes    Quit date: 10/2016    Years since  quitting: 3.9  . Smokeless tobacco: Never Used  Vaping Use  . Vaping Use: Former  Substance and Sexual Activity  . Alcohol use: Not Currently    Alcohol/week: 0.0 standard drinks  . Drug use: No  . Sexual activity: Yes    Birth control/protection: None    Comment: vasectomy  Other Topics Concern  . Not on file  Social History Narrative  . Not on file   Social Determinants of Health   Financial Resource Strain: Not on file  Food Insecurity: Not on file  Transportation Needs: Not on file  Physical Activity: Not on file  Stress: Not on file  Social Connections: Not on file  Intimate Partner Violence: Not on file    Family History  Problem Relation Age of Onset  . Endometriosis Mother   . Allergic rhinitis Mother   . Cancer Father        benign liver CA  . Stroke Father        recurrent blood clots on the brain   . Lupus Father   . Cancer Maternal Grandmother 30       ovarian Ca, still surviving   . Osteoporosis Maternal Grandmother   . Ovarian  cancer Maternal Grandmother   . Heart disease Maternal Grandfather   . Cancer Maternal Grandfather 25       colon CA. metastatic  . Diabetes Maternal Uncle   . Heart disease Paternal Grandmother   . CAD Brother 73       early at age 63  . Immunodeficiency Brother   . Breast cancer Cousin   . Colon cancer Neg Hx   . Kidney cancer Neg Hx   . Bladder Cancer Neg Hx   . Angioedema Neg Hx   . Atopy Neg Hx   . Asthma Neg Hx   . Eczema Neg Hx   . Urticaria Neg Hx      Current Outpatient Medications:  .  ADVAIR HFA 230-21 MCG/ACT inhaler, USE 2 INHALATIONS TWICE A DAY, Disp: 36 g, Rfl: 3 .  albuterol (VENTOLIN HFA) 108 (90 Base) MCG/ACT inhaler, USE 2 INHALATIONS EVERY 6 HOURS AS NEEDED FOR WHEEZING OR SHORTNESS OF BREATH, Disp: 8.5 g, Rfl: 10 .  azelastine (ASTELIN) 0.1 % nasal spray, Place 1-2 sprays into both nostrils 2 (two) times daily as needed for rhinitis (nasal drainage). Use in each nostril as directed, Disp: 90 mL,  Rfl: 1 .  Azelastine-Fluticasone 137-50 MCG/ACT SUSP, Place 1 spray into the nose in the morning and at bedtime., Disp: 23 g, Rfl: 5 .  Cholecalciferol (VITAMIN D3) 125 MCG (5000 UT) TABS, Take 1 tablet by mouth daily. , Disp: , Rfl:  .  clindamycin-tretinoin (ZIANA) gel, Apply 1 application topically daily., Disp: , Rfl:  .  EPINEPHrine 0.3 mg/0.3 mL IJ SOAJ injection, See admin instructions. for allergic reaction, Disp: , Rfl:  .  famotidine (PEPCID) 20 MG tablet, Take 20 mg by mouth 2 (two) times daily. , Disp: , Rfl:  .  meloxicam (MOBIC) 15 MG tablet, Take 1 tablet by mouth daily as needed., Disp: , Rfl:  .  montelukast (SINGULAIR) 10 MG tablet, TAKE 1 TABLET AT BEDTIME, Disp: 90 tablet, Rfl: 3 .  nebivolol (BYSTOLIC) 5 MG tablet, Take 1 tablet (5 mg total) by mouth daily., Disp: 90 tablet, Rfl: 3 .  pantoprazole (PROTONIX) 40 MG tablet, TAKE 1 TABLET TWICE A DAY, Disp: 180 tablet, Rfl: 1 .  vitamin B-12 (CYANOCOBALAMIN) 1000 MCG tablet, Take 1,000 mcg by mouth daily., Disp: , Rfl:  .  Bacillus Coagulans-Inulin (PROBIOTIC-PREBIOTIC PO), Take by mouth. (Patient not taking: Reported on 10/03/2020), Disp: , Rfl:  .  fluticasone (FLONASE) 50 MCG/ACT nasal spray, Place 1 spray into both nostrils 2 (two) times daily as needed for allergies or rhinitis (nasal congestion). (Patient not taking: Reported on 10/03/2020), Disp: 48 g, Rfl: 1 .  propranolol (INDERAL) 10 MG tablet, Take 1 tablet (10 mg total) by mouth 3 (three) times daily. Take only as needed for tachycardia (Patient not taking: Reported on 10/03/2020), Disp: 60 tablet, Rfl: 1  Physical exam:  Vitals:   10/03/20 1028  BP: 118/89  Pulse: 91  Resp: 20  Temp: 98.6 F (37 C)  TempSrc: Tympanic  SpO2: 99%  Weight: (!) 302 lb 1.6 oz (137 kg)   Physical Exam HENT:     Head: Normocephalic and atraumatic.  Eyes:     Extraocular Movements: EOM normal.     Pupils: Pupils are equal, round, and reactive to light.  Cardiovascular:      Rate and Rhythm: Normal rate and regular rhythm.     Heart sounds: Normal heart sounds.  Pulmonary:     Effort: Pulmonary effort is normal.  Breath sounds: Normal breath sounds.  Abdominal:     General: Bowel sounds are normal.     Palpations: Abdomen is soft.     Comments: No palpable splenomegaly  Musculoskeletal:     Cervical back: Normal range of motion.  Lymphadenopathy:     Comments: No palpable cervical, supraclavicular, axillary or inguinal adenopathy   Skin:    General: Skin is warm and dry.  Neurological:     Mental Status: She is alert and oriented to person, place, and time.      CMP Latest Ref Rng & Units 05/31/2020  Glucose 70 - 99 mg/dL 88  BUN 6 - 20 mg/dL 12  Creatinine 0.44 - 1.00 mg/dL 0.80  Sodium 135 - 145 mmol/L 140  Potassium 3.5 - 5.1 mmol/L 4.1  Chloride 98 - 111 mmol/L 106  CO2 22 - 32 mmol/L 27  Calcium 8.9 - 10.3 mg/dL 8.5(L)  Total Protein 6.5 - 8.1 g/dL 7.5  Total Bilirubin 0.3 - 1.2 mg/dL 0.6  Alkaline Phos 38 - 126 U/L 56  AST 15 - 41 U/L 18  ALT 0 - 44 U/L 17   CBC Latest Ref Rng & Units 10/03/2020  WBC 4.0 - 10.5 K/uL 2.7(L)  Hemoglobin 12.0 - 15.0 g/dL 13.7  Hematocrit 36.0 - 46.0 % 41.8  Platelets 150 - 400 K/uL 155      Assessment and plan- Patient is a 39 y.o. female with isolated neutropenia over the last 1 year of unclear etiology  Patient's white cell count has fluctuated between 2.4-4.8 over the last 1 year.Up until 2019 her white cell count was normal but over the last 1 year her [place] has been between 0.5-1.5.  She even had a bone marrow biopsy which did not show any primary bone marrow pathology.  She does get occasional episodes of bronchitis which may be very well associated with her allergies as well.  She otherwise does not get any repeated infections or has not needed any hospitalizations.  I would like to hold off on treating this neutropenia with steroids or Rituxan for possible autoimmune etiology since her counts  fluctuate with time.  She does have an appointment coming up with [facility] hematology next month as well.  CBC with differential in 3 months in 6 months and I will see her back in 6 months   Visit Diagnosis 1. Chronic neutropenia (HCC)      Dr. Randa Evens, MD, MPH [facility] at [facility] [phone] 10/03/2020 11:41 AM

## 2020-10-04 NOTE — Addendum Note (Signed)
Addended by: Kern Alberta on: 10/04/2020 09:09 AM   Modules accepted: Orders

## 2020-10-12 ENCOUNTER — Encounter: Payer: Commercial Managed Care - PPO | Admitting: Family Medicine

## 2020-10-16 ENCOUNTER — Encounter: Payer: Self-pay | Admitting: Internal Medicine

## 2020-10-16 ENCOUNTER — Telehealth (INDEPENDENT_AMBULATORY_CARE_PROVIDER_SITE_OTHER): Payer: Commercial Managed Care - PPO | Admitting: Internal Medicine

## 2020-10-16 DIAGNOSIS — F411 Generalized anxiety disorder: Secondary | ICD-10-CM | POA: Diagnosis not present

## 2020-10-16 MED ORDER — ALPRAZOLAM 0.25 MG PO TABS: 0.2500 mg | ORAL_TABLET | Freq: Every day | ORAL | 1 refills | Status: DC | PRN

## 2020-10-16 MED ORDER — SERTRALINE HCL 50 MG PO TABS: ORAL_TABLET | ORAL | 0 refills | Status: DC
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
## 2020-10-24 ENCOUNTER — Encounter: Payer: Self-pay | Admitting: Oncology

## 2020-10-26 ENCOUNTER — Other Ambulatory Visit: Payer: Commercial Managed Care - PPO

## 2020-10-26 DIAGNOSIS — Z20822 Contact with and (suspected) exposure to covid-19: Secondary | ICD-10-CM

## 2020-10-28 LAB — NOVEL CORONAVIRUS, NAA: SARS-CoV-2, NAA: NOT DETECTED

## 2020-10-28 LAB — SARS-COV-2, NAA 2 DAY TAT

## 2020-11-02 ENCOUNTER — Telehealth: Payer: Self-pay | Admitting: Internal Medicine

## 2020-11-02 MED ORDER — SERTRALINE HCL 50 MG PO TABS: ORAL_TABLET | ORAL | 0 refills | Status: DC

## 2020-11-02 NOTE — Telephone Encounter (Signed)
[facility] called to get a refill on sertraline (ZOLOFT) 50 MG tablet sent in for a 90 day supply

## 2020-11-06 ENCOUNTER — Ambulatory Visit: Payer: Commercial Managed Care - PPO | Admitting: Allergy

## 2020-11-07 ENCOUNTER — Encounter: Payer: Commercial Managed Care - PPO | Admitting: Allergy

## 2020-11-08 ENCOUNTER — Other Ambulatory Visit: Payer: Self-pay

## 2020-11-08 MED ORDER — SERTRALINE HCL 50 MG PO TABS: 50.0000 mg | ORAL_TABLET | Freq: Every day | ORAL | 0 refills | Status: DC

## 2020-11-08 MED ORDER — NEBIVOLOL HCL 5 MG PO TABS: 5.0000 mg | ORAL_TABLET | Freq: Every day | ORAL | 3 refills | Status: DC

## 2020-11-08 MED ORDER — FLUTICASONE PROPIONATE 50 MCG/ACT NA SUSP: 1.0000 | Freq: Two times a day (BID) | NASAL | 1 refills | Status: DC | PRN

## 2020-11-08 NOTE — Progress Notes (Signed)
Bystolic ref[facility] to [facility] as requested through [place].

## 2020-11-09 ENCOUNTER — Telehealth: Payer: Commercial Managed Care - PPO | Admitting: Internal Medicine

## 2020-11-09 ENCOUNTER — Encounter: Payer: Self-pay | Admitting: Internal Medicine

## 2020-11-09 VITALS — Ht 69.02 in | Wt 307.0 lb

## 2020-11-09 DIAGNOSIS — F411 Generalized anxiety disorder: Secondary | ICD-10-CM

## 2020-11-09 DIAGNOSIS — M329 Systemic lupus erythematosus, unspecified: Secondary | ICD-10-CM | POA: Diagnosis not present

## 2020-11-09 NOTE — Progress Notes (Signed)
Virtual Visit Caregility  Note  This visit type was conducted due to national recommendations for restrictions regarding the COVID-19 pandemic (e.g. social distancing).  This format is felt to be most appropriate for this patient at this time.  All issues noted in this document were discussed and addressed.  No physical exam was performed (except for noted visual exam findings with Video Visits).   I connected with@ on 11/09/20 at  8:00 AM EST by a video enabled telemedicine application  and verified that I am speaking with the correct person using two identifiers. Location patient: home Location provider: work or home office Persons participating in the virtual visit: patient, provider  I discussed the limitations, risks, security and privacy concerns of performing an evaluation and management service by telephone and the availability of in person appointments. I also discussed with the patient that there may be a patient responsible charge related to this service. The patient expressed understanding and agreed to proceed.   Reason for visit: follow up on GAD, neutropenia   HPI:   Neutropenia:  She was  Referredby Dr Janese Banks  to [facility] hematologist Dr Gaylyn Cheers for a second opinion. Marland Kitchen  SLE suspected given FH of SLE and CKD..  Labs reviewed:Marland Kitchen  ENA borderline,  CRP and ESR high, HLA B-27 normal.  Initiation of  placquenil advised,  Patient prefers to wait for rheumatology to start.  Having Diffuse body pain. Involving most joints.  Labs and OV note reviewed with patient along with risks and benefits of Placquenil.    Feeling better on the zoloft.    ROS: See pertinent positives and negatives per HPI.  Past Medical History:  Diagnosis Date  . Allergy   . Anemia   . Anxiety   . Asthma   . Chlamydia    h/o  . DDD (degenerative disc disease), cervical 04/07/2019  . Disorder of vocal cord    spasmodic dysphonia  . Dysmenorrhea   . Dyspareunia, female   . Endometriosis   . Family history of  endometriosis   . GERD (gastroesophageal reflux disease)   . Headache    migraine  . Heavy periods   . Hiatal hernia   . History of nephrolithiasis   . Hypertension   . Jaundice, physiologic, newborn   . Obesity (BMI 30-39.9)   . Sleep apnea   . Tobacco user   . Vaginal Pap smear, abnormal     Past Surgical History:  Procedure Laterality Date  . BONE MARROW BIOPSY    . DILATATION & CURETTAGE/HYSTEROSCOPY WITH MYOSURE N/A 08/27/2018   Procedure: DILATATION & CURETTAGE/HYSTEROSCOPY WITH MINERVA;  Surgeon: Gae Dry, MD;  Location: [facility] ORS;  Service: Gynecology;  Laterality: N/A;  . DILATION AND CURETTAGE OF UTERUS    . ESOPHAGOGASTRODUODENOSCOPY (EGD) WITH PROPOFOL N/A 08/07/2018   Procedure: ESOPHAGOGASTRODUODENOSCOPY (EGD) WITH PROPOFOL;  Surgeon: Jonathon Bellows, MD;  Location: [facility] ENDOSCOPY;  Service: Gastroenterology;  Laterality: N/A;  . laparoscopy    . ROOT CANAL    . WISDOM TOOTH EXTRACTION      Family History  Problem Relation Age of Onset  . Endometriosis Mother   . Allergic rhinitis Mother   . Cancer Father        benign liver CA  . Stroke Father        recurrent blood clots on the brain   . Lupus Father   . Cancer Maternal Grandmother 30       ovarian Ca, still surviving   . Osteoporosis Maternal  Grandmother   . Ovarian cancer Maternal Grandmother   . Heart disease Maternal Grandfather   . Cancer Maternal Grandfather 76       colon CA. metastatic  . Diabetes Maternal Uncle   . Heart disease Paternal Grandmother   . CAD Brother 32       early at age 76  . Immunodeficiency Brother   . Breast cancer Cousin   . Colon cancer Neg Hx   . Kidney cancer Neg Hx   . Bladder Cancer Neg Hx   . Angioedema Neg Hx   . Atopy Neg Hx   . Asthma Neg Hx   . Eczema Neg Hx   . Urticaria Neg Hx     SOCIAL HX:  reports that she quit smoking about 4 years ago. Her smoking use included cigarettes. She quit after 10.00 years of use. She has never used smokeless tobacco.  She reports previous alcohol use. She reports that she does not use drugs.   Current Outpatient Medications:  .  ADVAIR HFA 419-62 MCG/ACT inhaler, USE 2 INHALATIONS TWICE A DAY, Disp: 36 g, Rfl: 3 .  albuterol (VENTOLIN HFA) 108 (90 Base) MCG/ACT inhaler, USE 2 INHALATIONS EVERY 6 HOURS AS NEEDED FOR WHEEZING OR SHORTNESS OF BREATH, Disp: 8.5 g, Rfl: 10 .  ALPRAZolam (XANAX) 0.25 MG tablet, Take 1 tablet (0.25 mg total) by mouth daily as needed. For panic attacks, Disp: 30 tablet, Rfl: 1 .  azelastine (ASTELIN) 0.1 % nasal spray, Place 1-2 sprays into both nostrils 2 (two) times daily as needed for rhinitis (nasal drainage). Use in each nostril as directed, Disp: 90 mL, Rfl: 1 .  Azelastine-Fluticasone 137-50 MCG/ACT SUSP, Place 1 spray into the nose in the morning and at bedtime., Disp: 23 g, Rfl: 5 .  Bacillus Coagulans-Inulin (PROBIOTIC-PREBIOTIC PO), Take by mouth., Disp: , Rfl:  .  Cetirizine HCl (ZYRTEC ALLERGY) 10 MG CAPS, Take by mouth., Disp: , Rfl:  .  Cholecalciferol (VITAMIN D3) 125 MCG (5000 UT) TABS, Take 1 tablet by mouth daily. , Disp: , Rfl:  .  clindamycin-tretinoin (ZIANA) gel, Apply 1 application topically daily., Disp: , Rfl:  .  EPINEPHrine 0.3 mg/0.3 mL IJ SOAJ injection, See admin instructions. for allergic reaction, Disp: , Rfl:  .  famotidine (PEPCID) 20 MG tablet, Take 20 mg by mouth 2 (two) times daily. , Disp: , Rfl:  .  fluticasone (FLONASE) 50 MCG/ACT nasal spray, Place 1 spray into both nostrils 2 (two) times daily as needed for allergies or rhinitis (nasal congestion)., Disp: 48 g, Rfl: 1 .  montelukast (SINGULAIR) 10 MG tablet, TAKE 1 TABLET AT BEDTIME, Disp: 90 tablet, Rfl: 3 .  nebivolol (BYSTOLIC) 5 MG tablet, Take 1 tablet (5 mg total) by mouth daily., Disp: 90 tablet, Rfl: 3 .  pantoprazole (PROTONIX) 40 MG tablet, TAKE 1 TABLET TWICE A DAY, Disp: 180 tablet, Rfl: 1 .  propranolol (INDERAL) 10 MG tablet, Take 1 tablet (10 mg total) by mouth 3 (three) times  daily. Take only as needed for tachycardia, Disp: 60 tablet, Rfl: 1 .  sertraline (ZOLOFT) 50 MG tablet, Take 1 tablet (50 mg total) by mouth daily., Disp: 90 tablet, Rfl: 0 .  vitamin B-12 (CYANOCOBALAMIN) 1000 MCG tablet, Take 1,000 mcg by mouth daily., Disp: , Rfl:   EXAM:  VITALS per patient if applicable:  GENERAL: alert, oriented, appears well and in no acute distress  HEENT: atraumatic, conjunttiva clear, no obvious abnormalities on inspection of external nose and ears  NECK: normal movements of the head and neck  LUNGS: on inspection no signs of respiratory distress, breathing rate appears normal, no obvious gross SOB, gasping or wheezing  CV: no obvious cyanosis  MS: moves all visible extremities without noticeable abnormality  PSYCH/NEURO: pleasant and cooperative, no obvious depression or anxiety, speech and thought processing grossly intact  ASSESSMENT AND PLAN:  Discussed the following assessment and plan:  SLE (systemic lupus erythematosus related syndrome) ([place]) - Plan: Ambulatory referral to Rheumatology, Ambulatory referral to ENT, Ambulatory referral to Ophthalmology  Generalized anxiety disorder  SLE (systemic lupus erythematosus related syndrome) ([place]) Suggested by FH ,  Neutropenia,  Diffuse joint pain.   Diagnosed by Dr Gaylyn Cheers at [facility].  Referral to Rheum in progress; patient prefers to wait to start Placquenil  Generalized anxiety disorder Feeling better since starting sertraline . No changes today    I discussed the assessment and treatment plan with the patient. The patient was provided an opportunity to ask questions and all were answered. The patient agreed with the plan and demonstrated an understanding of the instructions.   The patient was advised to call back or seek an in-person evaluation if the symptoms worsen or if the condition fails to improve as anticipated.  I provided  30 minutes of  face-to-face time during this encounter reviewing patient's  current problems and past surgeries, labs and imaging studies, providing counseling on the above mentioned problems , and coordination  of care .  Crecencio Mc, MD

## 2020-11-09 NOTE — Assessment & Plan Note (Signed)
Suggested by FH ,  Neutropenia,  Diffuse joint pain.   Diagnosed by Dr Gaylyn Cheers at [facility].  Referral to Rheum in progress; patient prefers to wait to start Placquenil

## 2020-11-09 NOTE — Assessment & Plan Note (Signed)
Feeling better since starting sertraline . No changes today

## 2020-11-11 ENCOUNTER — Other Ambulatory Visit: Payer: Self-pay | Admitting: Internal Medicine

## 2020-11-13 ENCOUNTER — Telehealth: Payer: Self-pay | Admitting: Internal Medicine

## 2020-11-13 NOTE — Telephone Encounter (Signed)
Patient would rather have a referral to [facility] Rheumatology. She saw him a couple of years agp

## 2020-11-14 NOTE — Telephone Encounter (Signed)
Good morning!  Ok Thank you!

## 2020-11-21 ENCOUNTER — Encounter: Payer: Commercial Managed Care - PPO | Admitting: Allergy

## 2020-11-30 ENCOUNTER — Telehealth: Payer: Self-pay

## 2020-11-30 ENCOUNTER — Telehealth: Payer: Self-pay | Admitting: *Deleted

## 2020-11-30 NOTE — Telephone Encounter (Signed)
I have seen and reviewed notes from Dr. Gaylyn Cheers and Dr. Derrel Nip

## 2020-11-30 NOTE — Telephone Encounter (Signed)
Patient called asking if Dr Janese Banks has received a fax from Dr Derrel Nip with the notes from Dr Manus Rudd yet

## 2020-11-30 NOTE — Telephone Encounter (Signed)
Not for a year

## 2020-11-30 NOTE — Telephone Encounter (Signed)
Patient informed that doctor has seen notes

## 2020-11-30 NOTE — Telephone Encounter (Signed)
Pt wants to know when she is supposed to have her second pneumonia shot

## 2020-12-07 LAB — HM DIABETES EYE EXAM

## 2020-12-08 ENCOUNTER — Encounter: Payer: Self-pay | Admitting: Internal Medicine

## 2020-12-08 ENCOUNTER — Telehealth (INDEPENDENT_AMBULATORY_CARE_PROVIDER_SITE_OTHER): Payer: Commercial Managed Care - PPO | Admitting: Internal Medicine

## 2020-12-08 DIAGNOSIS — F411 Generalized anxiety disorder: Secondary | ICD-10-CM | POA: Diagnosis not present

## 2020-12-08 DIAGNOSIS — M329 Systemic lupus erythematosus, unspecified: Secondary | ICD-10-CM | POA: Diagnosis not present

## 2020-12-10 NOTE — Progress Notes (Signed)
Virtual Visit via Normal  This visit type was conducted due to national recommendations for restrictions regarding the COVID-19 pandemic (e.g. social distancing).  This format is felt to be most appropriate for this patient at this time.  All issues noted in this document were discussed and addressed.  No physical exam was performed (except for noted visual exam findings with Video Visits).   I connected with@ on 12/10/20 at  4:00 PM EST by a video enabled telemedicine application  and verified that I am speaking with the correct person using two identifiers. Location patient: home Location provider: work or home office Persons participating in the virtual visit: patient, provider  I discussed the limitations, risks, security and privacy concerns of performing an evaluation and management service by telephone and the availability of in person appointments. I also discussed with the patient that there may be a patient responsible charge related to this service. The patient expressed understanding and agreed to proceed.  Reason for visit: follow up on chronic issues   HPI:  40 yr old female with history of neutropenia and polyarthritis,  Diagnosed with SLE by [facility] hematology [place] local hematology follow up for initiation of Plaquenil vs other agent , presents for one month follow up on depression/anxiety and obesity.  She has been restricting her activities since the [place] pandemic started and remains very anxious about getting sick.  She has been vaccinated.  She has not lost weight due to restriction of activities.  she was recently give access to a virtual play game and has been spending several hours daily using it for recreational purposes since she does not work, and her husbandk.hd is a Conservation officer, historic buildings.  She has noticed some predatory behavior by the younger female players and has distanced herself from any contact with them.  The patient feels generally well,  And   denies any feelings of hopelessness or anhedonia.  No panic attacks.  Patient denies any active or passive suicidal thoughts or psychosis.  Appetite is good.  Denies any marital conflict or other stressors contributing to anxiety     ROS: See pertinent positives and negatives per HPI.  Past Medical History:  Diagnosis Date  . Allergy   . Anemia   . Anxiety   . Asthma   . Chlamydia    h/o  . DDD (degenerative disc disease), cervical 04/07/2019  . Disorder of vocal cord    spasmodic dysphonia  . Dysmenorrhea   . Dyspareunia, female   . Endometriosis   . Family history of endometriosis   . GERD (gastroesophageal reflux disease)   . Headache    migraine  . Heavy periods   . Hiatal hernia   . History of nephrolithiasis   . Hypertension   . Jaundice, physiologic, newborn   . Obesity (BMI 30-39.9)   . Sleep apnea   . Tobacco user   . Vaginal Pap smear, abnormal     Past Surgical History:  Procedure Laterality Date  . BONE MARROW BIOPSY    . DILATATION & CURETTAGE/HYSTEROSCOPY WITH MYOSURE N/A 08/27/2018   Procedure: DILATATION & CURETTAGE/HYSTEROSCOPY WITH MINERVA;  Surgeon: Gae Dry, MD;  Location: [facility] ORS;  Service: Gynecology;  Laterality: N/A;  . DILATION AND CURETTAGE OF UTERUS    . ESOPHAGOGASTRODUODENOSCOPY (EGD) WITH PROPOFOL N/A 08/07/2018   Procedure: ESOPHAGOGASTRODUODENOSCOPY (EGD) WITH PROPOFOL;  Surgeon: Jonathon Bellows, MD;  Location: [facility] ENDOSCOPY;  Service: Gastroenterology;  Laterality: N/A;  . laparoscopy    .  ROOT CANAL    . WISDOM TOOTH EXTRACTION      Family History  Problem Relation Age of Onset  . Endometriosis Mother   . Allergic rhinitis Mother   . Cancer Father        benign liver CA  . Stroke Father        recurrent blood clots on the brain   . Lupus Father   . Cancer Maternal Grandmother 30       ovarian Ca, still surviving   . Osteoporosis Maternal Grandmother   . Ovarian cancer Maternal Grandmother   . Heart disease Maternal  Grandfather   . Cancer Maternal Grandfather 67       colon CA. metastatic  . Diabetes Maternal Uncle   . Heart disease Paternal Grandmother   . CAD Brother 45       early at age 55  . Immunodeficiency Brother   . Breast cancer Cousin   . Colon cancer Neg Hx   . Kidney cancer Neg Hx   . Bladder Cancer Neg Hx   . Angioedema Neg Hx   . Atopy Neg Hx   . Asthma Neg Hx   . Eczema Neg Hx   . Urticaria Neg Hx     SOCIAL HX:  reports that she quit smoking about 4 years ago. Her smoking use included cigarettes. She quit after 10.00 years of use. She has never used smokeless tobacco. She reports previous alcohol use. She reports that she does not use drugs.   Current Outpatient Medications:  .  ADVAIR HFA 151-76 MCG/ACT inhaler, USE 2 INHALATIONS TWICE A DAY, Disp: 36 g, Rfl: 3 .  albuterol (VENTOLIN HFA) 108 (90 Base) MCG/ACT inhaler, USE 2 INHALATIONS EVERY 6 HOURS AS NEEDED FOR WHEEZING OR SHORTNESS OF BREATH, Disp: 8.5 g, Rfl: 10 .  ALPRAZolam (XANAX) 0.25 MG tablet, Take 1 tablet (0.25 mg total) by mouth daily as needed. For panic attacks, Disp: 30 tablet, Rfl: 1 .  azelastine (ASTELIN) 0.1 % nasal spray, Place 1-2 sprays into both nostrils 2 (two) times daily as needed for rhinitis (nasal drainage). Use in each nostril as directed, Disp: 90 mL, Rfl: 1 .  Azelastine-Fluticasone 137-50 MCG/ACT SUSP, Place 1 spray into the nose in the morning and at bedtime., Disp: 23 g, Rfl: 5 .  Bacillus Coagulans-Inulin (PROBIOTIC-PREBIOTIC PO), Take by mouth., Disp: , Rfl:  .  Cetirizine HCl (ZYRTEC ALLERGY) 10 MG CAPS, Take by mouth., Disp: , Rfl:  .  Cholecalciferol (VITAMIN D3) 125 MCG (5000 UT) TABS, Take 1 tablet by mouth daily. , Disp: , Rfl:  .  clindamycin-tretinoin (ZIANA) gel, Apply 1 application topically daily., Disp: , Rfl:  .  EPINEPHrine 0.3 mg/0.3 mL IJ SOAJ injection, See admin instructions. for allergic reaction, Disp: , Rfl:  .  famotidine (PEPCID) 20 MG tablet, Take 20 mg by mouth 2  (two) times daily. , Disp: , Rfl:  .  fluticasone (FLONASE) 50 MCG/ACT nasal spray, Place 1 spray into both nostrils 2 (two) times daily as needed for allergies or rhinitis (nasal congestion)., Disp: 48 g, Rfl: 1 .  montelukast (SINGULAIR) 10 MG tablet, TAKE 1 TABLET AT BEDTIME, Disp: 90 tablet, Rfl: 3 .  nebivolol (BYSTOLIC) 5 MG tablet, Take 1 tablet (5 mg total) by mouth daily., Disp: 90 tablet, Rfl: 3 .  pantoprazole (PROTONIX) 40 MG tablet, TAKE 1 TABLET TWICE A DAY, Disp: 180 tablet, Rfl: 1 .  propranolol (INDERAL) 10 MG tablet, Take 1 tablet (10 mg  total) by mouth 3 (three) times daily. Take only as needed for tachycardia, Disp: 60 tablet, Rfl: 1 .  sertraline (ZOLOFT) 50 MG tablet, Take 1 tablet (50 mg total) by mouth daily., Disp: 90 tablet, Rfl: 0 .  vitamin B-12 (CYANOCOBALAMIN) 1000 MCG tablet, Take 1,000 mcg by mouth daily., Disp: , Rfl:   EXAM:  VITALS per patient if applicable:  GENERAL: alert, oriented, appears well and in no acute distress  HEENT: atraumatic, conjunttiva clear, no obvious abnormalities on inspection of external nose and ears  NECK: normal movements of the head and neck  LUNGS: on inspection no signs of respiratory distress, breathing rate appears normal, no obvious gross SOB, gasping or wheezing  CV: no obvious cyanosis  MS: moves all visible extremities without noticeable abnormality  PSYCH/NEURO: pleasant and cooperative, no obvious depression or anxiety, speech and thought processing grossly intact  ASSESSMENT AND PLAN:  Discussed the following assessment and plan:  Generalized anxiety disorder  SLE (systemic lupus erythematosus related syndrome) (HCC)  Generalized anxiety disorder She continues to feel better since starting sertraline . No changes today  SLE (systemic lupus erythematosus related syndrome) ([place]) Suggested by her signs and symptoms of neutropenia,  Diffuse joint pain, serologic markers, and family history . Diagnosis  confirmed by Dr Gaylyn Cheers, (hematologist)  at [facility].  She has seen rheumatology in the past and is scheduled to see her previous rheumatologist in [place] in a few weeks.  She is concerned about the side effects of Placquenil    I discussed the assessment and treatment plan with the patient. The patient was provided an opportunity to ask questions and all were answered. The patient agreed with the plan and demonstrated an understanding of the instructions.   The patient was advised to call back or seek an in-person evaluation if the symptoms worsen or if the condition fails to improve as anticipated.   I spent 20 minutes dedicated to the care of this patient on the date of this encounter to include pre-visit review of his medical history,  And Face-to-face time with the patient ,   Crecencio Mc, MD

## 2020-12-10 NOTE — Assessment & Plan Note (Signed)
Suggested by her signs and symptoms of neutropenia,  Diffuse joint pain, serologic markers, and family history . Diagnosis confirmed by Dr Gaylyn Cheers, (hematologist)  at [facility].  She has seen rheumatology in the past and is scheduled to see her previous rheumatologist in [place] in a few weeks.  She is concerned about the side effects of Placquenil

## 2020-12-10 NOTE — Assessment & Plan Note (Signed)
She continues to feel better since starting sertraline . No changes today

## 2020-12-14 ENCOUNTER — Encounter: Payer: Commercial Managed Care - PPO | Admitting: Allergy

## 2020-12-15 ENCOUNTER — Telehealth: Payer: Self-pay | Admitting: Internal Medicine

## 2020-12-15 NOTE — Telephone Encounter (Signed)
If she had called earlier we could have had her come by for COVID testing, which I would recommend she get over the weekend.  She can use Imodium if needed along with BRAT diet

## 2020-12-15 NOTE — Telephone Encounter (Signed)
Spoken with patient, She has lost her appetite, nauseated, and diarrhea x two days. She has currently had one episode of diarrhea with no warning. She is also have hot and cold instance but she is not running a fever. She has also taken some liquid IV for dehydrations due to diarrhea. She stated she will try the [place] and has appointment with Mable Paris, FNP on Monday due to no avaibility today. She refused to go to UC. But will go to UC if sx become worse.

## 2020-12-15 NOTE — Telephone Encounter (Signed)
Patient called in stated that she has loose of appetite for a few days and today she has loose diarrhea

## 2020-12-18 ENCOUNTER — Telehealth (INDEPENDENT_AMBULATORY_CARE_PROVIDER_SITE_OTHER): Payer: Commercial Managed Care - PPO | Admitting: Family

## 2020-12-18 DIAGNOSIS — R197 Diarrhea, unspecified: Secondary | ICD-10-CM | POA: Insufficient documentation

## 2020-12-18 DIAGNOSIS — G4733 Obstructive sleep apnea (adult) (pediatric): Secondary | ICD-10-CM | POA: Diagnosis not present

## 2020-12-18 DIAGNOSIS — F411 Generalized anxiety disorder: Secondary | ICD-10-CM | POA: Diagnosis not present

## 2020-12-18 MED ORDER — SERTRALINE HCL 100 MG PO TABS
100.0000 mg | ORAL_TABLET | Freq: Every day | ORAL | 1 refills | Status: DC
Start: 2020-12-18 — End: 2021-05-08

## 2020-12-18 NOTE — Progress Notes (Signed)
Virtual Visit via Video Note  I connected with@  on 12/18/20 at 11:30 AM EST by a video enabled telemedicine application and verified that I am speaking with the correct person using two identifiers.  Location patient: home Location provider:work  Persons participating in the virtual visit: patient, provider  I discussed the limitations of evaluation and management by telemedicine and the availability of in person appointments. The patient expressed understanding and agreed to proceed.   HPI:  Acute visit Sudden onset of nausea 7 days ago. When she thought of food , she felt nauseated.  She hadnt eaten at restaurant, no travel, recent antibiotics, or unusual foods for her. No one else in the house has been sick. She recalls eating home made chicken salad at that time. At that time she had chills without fever.  Had an 3  episodes of diarrhea 4 days ago. After one day of brown diarrhea,  stool consistently became more formed.  No blood seen in stool. She saw BRB when wiping yesterday after BM. She describes that skin is broken down and 'irritated' from diarrhea. Stool is not burgundy in color. She doesn't normally strain to have BM. She has h/o hemorrhoids. No pain with defecation. She had anal itching for months and has been using preparation H with relief.   Nausea and diarrhea resolved and she has resumed eating normal. No abdominal pain. Abdomen is soft today. Passing gas.  She hasnt been on probiotic.   She questions if anxiety is playing a role. Compliant with zoloft 50mg  qam which she started last month. She has been very isolated when pandemic. Having trouble with husband and he is not being compassionate thus is worsening anxiety. Trouble falling asleep over the past 2 weeks. Endorses daily fatigue.  no si/hi. Never taken the xanax. She feels safe at home.    H/o SLE.   GERD- compliant with protonix 40mg  bid. No epigastric burning, bitter taste in mouth.   OSA- she is not wearing  cipap, and unable to afford oral applicance  Paroxysmal tachycardia- compliant with bystolic 5mg  daily.   Upper EGD 10/2018  No nsaids. No alcohol use.   ROS: See pertinent positives and negatives per HPI.    EXAM:  VITALS per patient if applicable: There were no vitals taken for this visit. BP Readings from Last 3 Encounters:  10/03/20 118/89  10/02/20 120/82  09/12/20 112/74   Wt Readings from Last 3 Encounters:  12/08/20 (!) 307 lb (139.3 kg)  11/09/20 (!) 307 lb (139.3 kg)  10/16/20 (!) 302 lb (137 kg)    GENERAL: alert, oriented, appears well and in no acute distress  HEENT: atraumatic, conjunttiva clear, no obvious abnormalities on inspection of external nose and ears  NECK: normal movements of the head and neck  LUNGS: on inspection no signs of respiratory distress, breathing rate appears normal, no obvious gross SOB, gasping or wheezing  CV: no obvious cyanosis  MS: moves all visible extremities without noticeable abnormality  PSYCH/NEURO: pleasant and cooperative, no obvious depression or anxiety, speech and thought processing grossly intact  ASSESSMENT AND PLAN:  Discussed the following assessment and plan:  Problem List Items Addressed This Visit      Respiratory   OSA (obstructive sleep apnea)    Noncompliant with cipap or oral appliance due to cost. Advised to discuss this with PCP and that unfortunately untreated OSA is likely contributing to fatigue.        Other   Diarrhea    Nausea  and diarrhea resolved. Episode of BRB when wiping. Suspect either r/t skin breakdown or hemorrhoids. Advised if were to recur she needs to make in person follow up.       Generalized anxiety disorder - Primary    Uncontrolled of late. We discussed whether recent episode of diarrhea related to anxiety. We agreed trial of zoloft 100mg  to be taken at bedtime ( as opposed to the morning) to see if helps with sleep, anxiety. She will have follow up with pcp.        Relevant Medications   sertraline (ZOLOFT) 100 MG tablet      -we discussed possible serious and likely etiologies, options for evaluation and workup, limitations of telemedicine visit vs in person visit, treatment, treatment risks and precautions. Pt prefers to treat via telemedicine empirically rather then risking or undertaking an in person visit at this moment.  .   I discussed the assessment and treatment plan with the patient. The patient was provided an opportunity to ask questions and all were answered. The patient agreed with the plan and demonstrated an understanding of the instructions.   The patient was advised to call back or seek an in-person evaluation if the symptoms worsen or if the condition fails to improve as anticipated.   Mable Paris, FNP

## 2020-12-18 NOTE — Assessment & Plan Note (Signed)
Noncompliant with cipap or oral appliance due to cost. Advised to discuss this with PCP and that unfortunately untreated OSA is likely contributing to fatigue.

## 2020-12-18 NOTE — Assessment & Plan Note (Signed)
Uncontrolled of late. We discussed whether recent episode of diarrhea related to anxiety. We agreed trial of zoloft 100mg  to be taken at bedtime ( as opposed to the morning) to see if helps with sleep, anxiety. She will have follow up with pcp.

## 2020-12-18 NOTE — Assessment & Plan Note (Signed)
Nausea and diarrhea resolved. Episode of BRB when wiping. Suspect either r/t skin breakdown or hemorrhoids. Advised if were to recur she needs to make in person follow up.

## 2020-12-18 NOTE — Patient Instructions (Signed)
If you see blood from rectum or experience diarrhea again,  Please let us know  Trial increase zoloft to 100mg 

## 2021-01-01 ENCOUNTER — Other Ambulatory Visit: Payer: Self-pay

## 2021-01-01 ENCOUNTER — Inpatient Hospital Stay: Payer: Commercial Managed Care - PPO | Attending: Oncology

## 2021-01-01 DIAGNOSIS — D709 Neutropenia, unspecified: Secondary | ICD-10-CM

## 2021-01-01 LAB — CBC WITH DIFFERENTIAL/PLATELET
Abs Immature Granulocytes: 0.01 10*3/uL (ref 0.00–0.07)
Basophils Absolute: 0 10*3/uL (ref 0.0–0.1)
Basophils Relative: 1 %
Eosinophils Absolute: 0.1 10*3/uL (ref 0.0–0.5)
Eosinophils Relative: 4 %
HCT: 43.2 % (ref 36.0–46.0)
Hemoglobin: 14.4 g/dL (ref 12.0–15.0)
Immature Granulocytes: 0 %
Lymphocytes Relative: 57 %
Lymphs Abs: 1.8 10*3/uL (ref 0.7–4.0)
MCH: 29.8 pg (ref 26.0–34.0)
MCHC: 33.3 g/dL (ref 30.0–36.0)
MCV: 89.4 fL (ref 80.0–100.0)
Monocytes Absolute: 0.6 10*3/uL (ref 0.1–1.0)
Monocytes Relative: 20 %
Neutro Abs: 0.6 10*3/uL — ABNORMAL LOW (ref 1.7–7.7)
Neutrophils Relative %: 18 %
Platelets: 201 10*3/uL (ref 150–400)
RBC: 4.83 MIL/uL (ref 3.87–5.11)
RDW: 12.4 % (ref 11.5–15.5)
WBC: 3.2 10*3/uL — ABNORMAL LOW (ref 4.0–10.5)
nRBC: 0 % (ref 0.0–0.2)

## 2021-01-02 ENCOUNTER — Ambulatory Visit (INDEPENDENT_AMBULATORY_CARE_PROVIDER_SITE_OTHER): Payer: Commercial Managed Care - PPO

## 2021-01-02 ENCOUNTER — Encounter: Payer: Self-pay | Admitting: Allergy

## 2021-01-02 ENCOUNTER — Ambulatory Visit: Payer: Commercial Managed Care - PPO | Admitting: Allergy

## 2021-01-02 ENCOUNTER — Other Ambulatory Visit: Payer: Self-pay

## 2021-01-02 VITALS — BP 118/80 | HR 102 | Temp 97.4°F | Resp 18 | Ht 69.0 in | Wt 305.0 lb

## 2021-01-02 DIAGNOSIS — J4540 Moderate persistent asthma, uncomplicated: Secondary | ICD-10-CM

## 2021-01-02 DIAGNOSIS — T50905D Adverse effect of unspecified drugs, medicaments and biological substances, subsequent encounter: Secondary | ICD-10-CM

## 2021-01-02 DIAGNOSIS — Z8709 Personal history of other diseases of the respiratory system: Secondary | ICD-10-CM | POA: Diagnosis not present

## 2021-01-02 DIAGNOSIS — H1013 Acute atopic conjunctivitis, bilateral: Secondary | ICD-10-CM | POA: Diagnosis not present

## 2021-01-02 DIAGNOSIS — J3089 Other allergic rhinitis: Secondary | ICD-10-CM | POA: Diagnosis not present

## 2021-01-02 DIAGNOSIS — R12 Heartburn: Secondary | ICD-10-CM

## 2021-01-02 DIAGNOSIS — H1010 Acute atopic conjunctivitis, unspecified eye: Secondary | ICD-10-CM

## 2021-01-02 DIAGNOSIS — T50Z95D Adverse effect of other vaccines and biological substances, subsequent encounter: Secondary | ICD-10-CM

## 2021-01-02 DIAGNOSIS — Z23 Encounter for immunization: Secondary | ICD-10-CM | POA: Diagnosis not present

## 2021-01-02 DIAGNOSIS — T50Z95A Adverse effect of other vaccines and biological substances, initial encounter: Secondary | ICD-10-CM | POA: Insufficient documentation

## 2021-01-02 DIAGNOSIS — J302 Other seasonal allergic rhinitis: Secondary | ICD-10-CM

## 2021-01-02 NOTE — Assessment & Plan Note (Signed)
Past history - Patient concerned about getting booster vaccine as she had some throat tightness with her first [place] vaccine and had more severe symptoms with whole body flushing and throat dryness after her second Pfizer vaccine. She has history of having issues with her throat though and is being followed by ENT for this. History of anxiety. Flu vaccines cause similar throat discomfort but no other symptoms. Morphine causes itching and hypotension.  Interim history -   Negative skin testing today to the COVID-19 vaccines.  Received the third (booster) Pfizer COVID-19 vaccine today in a graded vaccine method.  She did develop a slight throat clearing after the last dose was given but patient was not sure if it was due not taking her allergy medications and allergies bothering her and/or wearing the mask in the office.   She was treated with zyrtec 10mg . Vitals stable throughout and physical exam unremarkable.  The throat clearing improved and then patient was discharged.   For next 24 hours monitor for hives, swelling, shortness of breath and dizziness. If you see these symptoms, use Benadryl 25mg  to 50mg  for mild symptoms and epinephrine for more severe symptoms and call 911.  Patient was recently diagnosed with lupus but not on any medications - advised to follow up with rheumatology.  As of now patient is fully vaccinated and will discuss at next visit if she needs a fourth booster in the future.

## 2021-01-02 NOTE — Patient Instructions (Addendum)
You had negative skin testing today to the COVID-19 vaccines.  You received the third (booster) Pfizer COVID-19 vaccine today.  Please see handout about expected side effects after vaccination.  For next 24 hours monitor for hives, swelling, shortness of breath and dizziness. If you see these symptoms, use Benadryl 25mg  to 50mg  for mild symptoms and epinephrine for more severe symptoms and call 911.  Environmental allergies  2021 skin testing Positive to grass, weed, ragweed, trees, mold, dust mites, cat, dog, cockroach.  Continue environmental control measures as below.  May use over the counter antihistamines such as Zyrtec (cetirizine), Claritin (loratadine), Allegra (fexofenadine), or Xyzal (levocetirizine) daily as needed. May take twice a day during flares.  May use Flonase (fluticasone) nasal spray 1 spray per nostril twice a day as needed for nasal congestion.   May use azelastine nasal spray 1-2 sprays per nostril twice a day as needed for runny nose/drainage.   Nasal saline spray (i.e., Simply Saline) or nasal saline lavage (i.e., NeilMed) is recommended as needed and prior to medicated nasal sprays.  Continue Singulair (montelukast) 10mg  daily at night.  Asthma: . Daily controller medication(s): Advair 250mcg 2 puffs twice a day with spacer and rinse mouth afterwards. . Continue Singulair 10mg  daily.  . May use albuterol rescue inhaler 2 puffs every 4 to 6 hours as needed for shortness of breath, chest tightness, coughing, and wheezing. May use albuterol rescue inhaler 2 puffs 5 to 15 minutes prior to strenuous physical activities. Monitor frequency of use.  . Asthma control goals:  o Full participation in all desired activities (may need albuterol before activity) o Albuterol use two times or less a week on average (not counting use with activity) o Cough interfering with sleep two times or less a month o Oral steroids no more than once a year o No  hospitalizations  Food:  No dietary restrictions.   Infections:  . Keep track of infections. Get bloodwork:  We are ordering labs, so please allow 1-2 weeks for the results to come back. With the newly implemented Cures Act, the labs might be visible to you at the same time that they become visible to me. However, I will not address the results until all of the results are back, so please be   Reflux/coughing:  Continue with reflux medication and lifestyle diet as below.  Rash:  See below for proper skin care.   Follow up in 4 months or sooner if needed - check on asthma and allergies. Either with [place] or with an allergist closer to your home.   Reducing Pollen Exposure . Pollen seasons: trees (spring), grass (summer) and ragweed/weeds (fall). Marland Kitchen Keep windows closed in your home and car to lower pollen exposure.  Susa Simmonds air conditioning in the bedroom and throughout the house if possible.  . Avoid going out in dry windy days - especially early morning. . Pollen counts are highest between 5 - 10 AM and on dry, hot and windy days.  . Save outside activities for late afternoon or after a heavy rain, when pollen levels are lower.  . Avoid mowing of grass if you have grass pollen allergy. Marland Kitchen Be aware that pollen can also be transported indoors on people and pets.  . Dry your clothes in an automatic dryer rather than hanging them outside where they might collect pollen.  . Rinse hair and eyes before bedtime. Mold Control . Mold and fungi can grow on a variety of surfaces provided certain temperature and  moisture conditions exist.  . Outdoor molds grow on plants, decaying vegetation and soil. The major outdoor mold, Alternaria and Cladosporium, are found in very high numbers during hot and dry conditions. Generally, a late summer - fall peak is seen for common outdoor fungal spores. Rain will temporarily lower outdoor mold spore count, but counts rise rapidly when the rainy period  ends. . The most important indoor molds are Aspergillus and Penicillium. Dark, humid and poorly ventilated basements are ideal sites for mold growth. The next most common sites of mold growth are the bathroom and the kitchen. Outdoor (Seasonal) Mold Control . Use air conditioning and keep windows closed. . Avoid exposure to decaying vegetation. Marland Kitchen Avoid leaf raking. . Avoid grain handling. . Consider wearing a face mask if working in moldy areas.  Indoor (Perennial) Mold Control  . Maintain humidity below 50%. . Get rid of mold growth on hard surfaces with water, detergent and, if necessary, 5% bleach (do not mix with other cleaners). Then dry the area completely. If mold covers an area more than 10 square feet, consider hiring an indoor environmental professional. . For clothing, washing with soap and water is best. If moldy items cannot be cleaned and dried, throw them away. . Remove sources e.g. contaminated carpets. . Repair and seal leaking roofs or pipes. Using dehumidifiers in damp basements may be helpful, but empty the water and clean units regularly to prevent mildew from forming. All rooms, especially basements, bathrooms and kitchens, require ventilation and cleaning to deter mold and mildew growth. Avoid carpeting on concrete or damp floors, and storing items in damp areas. Control of House Dust Mite Allergen . Dust mite allergens are a common trigger of allergy and asthma symptoms. While they can be found throughout the house, these microscopic creatures thrive in warm, humid environments such as bedding, upholstered furniture and carpeting. . Because so much time is spent in the bedroom, it is essential to reduce mite levels there.  . Encase pillows, mattresses, and box springs in special allergen-proof fabric covers or airtight, zippered plastic covers.  . Bedding should be washed weekly in hot water (130 F) and dried in a hot dryer. Allergen-proof covers are available for  comforters and pillows that can't be regularly washed.  Wendee Copp the allergy-proof covers every few months. Minimize clutter in the bedroom. Keep pets out of the bedroom.  Marland Kitchen Keep humidity less than 50% by using a dehumidifier or air conditioning. You can buy a humidity measuring device called a hygrometer to monitor this.  . If possible, replace carpets with hardwood, linoleum, or washable area rugs. If that's not possible, vacuum frequently with a vacuum that has a HEPA filter. . Remove all upholstered furniture and non-washable window drapes from the bedroom. . Remove all non-washable stuffed toys from the bedroom.  Wash stuffed toys weekly. Pet Allergen Avoidance: . Contrary to popular opinion, there are no "hypoallergenic" breeds of dogs or cats. That is because people are not allergic to an animal's hair, but to an allergen found in the animal's saliva, dander (dead skin flakes) or urine. Pet allergy symptoms typically occur within minutes. For some people, symptoms can build up and become most severe 8 to 12 hours after contact with the animal. People with severe allergies can experience reactions in public places if dander has been transported on the pet owners' clothing. Marland Kitchen Keeping an animal outdoors is only a partial solution, since homes with pets in the yard still have higher concentrations of  animal allergens. . Before getting a pet, ask your allergist to determine if you are allergic to animals. If your pet is already considered part of your family, try to minimize contact and keep the pet out of the bedroom and other rooms where you spend a great deal of time. . As with dust mites, vacuum carpets often or replace carpet with a hardwood floor, tile or linoleum. . High-efficiency particulate air (HEPA) cleaners can reduce allergen levels over time. . While dander and saliva are the source of cat and dog allergens, urine is the source of allergens from rabbits, hamsters, mice and [place] pigs; so  ask a non-allergic family member to clean the animal's cage. . If you have a pet allergy, talk to your allergist about the potential for allergy immunotherapy (allergy shots). This strategy can often provide long-term relief. Cockroach Allergen Avoidance Cockroaches are often found in the homes of densely populated urban areas, schools or commercial buildings, but these creatures can lurk almost anywhere. This does not mean that you have a dirty house or living area. . Block all areas where roaches can enter the home. This includes crevices, wall cracks and windows.  . Cockroaches need water to survive, so fix and seal all leaky faucets and pipes. Have an exterminator go through the house when your family and pets are gone to eliminate any remaining roaches. Marland Kitchen Keep food in lidded containers and put pet food dishes away after your pets are done eating. Vacuum and sweep the floor after meals, and take out garbage and recyclables. Use lidded garbage containers in the kitchen. Wash dishes immediately after use and clean under stoves, refrigerators or toasters where crumbs can accumulate. Wipe off the stove and other kitchen surfaces and cupboards regularly.  Skin care recommendations  Bath time: . Always use lukewarm water. AVOID very hot or cold water. Marland Kitchen Keep bathing time to 5-10 minutes. . Do NOT use bubble bath. . Use a mild soap and use just enough to wash the dirty areas. . Do NOT scrub skin vigorously.  . After bathing, pat dry your skin with a towel. Do NOT rub or scrub the skin.  Moisturizers and prescriptions:  . ALWAYS apply moisturizers immediately after bathing (within 3 minutes). This helps to lock-in moisture. . Use the moisturizer several times a day over the whole body. Kermit Balo summer moisturizers include: Aveeno, CeraVe, Cetaphil. Kermit Balo winter moisturizers include: Aquaphor, Vaseline, Cerave, Cetaphil, Eucerin, Vanicream. . When using moisturizers along with medications, the  moisturizer should be applied about one hour after applying the medication to prevent diluting effect of the medication or moisturize around where you applied the medications. When not using medications, the moisturizer can be continued twice daily as maintenance.  Laundry and clothing: . Avoid laundry products with added color or perfumes. . Use unscented hypo-allergenic laundry products such as Tide free, Cheer free & gentle, and All free and clear.  . If the skin still seems dry or sensitive, you can try double-rinsing the clothes. . Avoid tight or scratchy clothing such as wool. . Do not use fabric softeners or dyer sheets.   Heartburn Heartburn is a type of pain or discomfort that can happen in the throat or chest. It is often described as a burning pain. It may also cause a bad, acid-like taste in the mouth. Heartburn may feel worse when you lie down or bend over. It may be worse at night. It may be caused by stomach contents that move back  up (reflux) into the tube that connects the mouth with the stomach (esophagus). Follow these instructions at home: Eating and drinking   Avoid certain foods and drinks as told by your doctor. This may include: ? Coffee and tea (with or without caffeine). ? Drinks that have alcohol. ? Energy drinks and sports drinks. ? Carbonated drinks or sodas. ? Chocolate and cocoa. ? Peppermint and mint flavorings. ? Garlic and onions. ? Horseradish. ? Spicy and acidic foods, such as:  Peppers.  Chili powder and curry powder.  Vinegar.  Hot sauces and BBQ sauce. ? Citrus fruit juices and citrus fruits, such as:  Oranges.  Lemons.  Limes. ? Tomato-based foods, such as:  Red sauce and pizza with red sauce.  Chili.  Salsa. ? Fried and fatty foods, such as:  Donuts.  [place] fries and potato chips.  High-fat dressings. ? High-fat meats, such as:  Hot dogs and sausage.  Rib eye steak.  Ham and bacon. ? High-fat dairy items, such  as:  Whole milk.  Butter.  Cream cheese.  Eat small meals often. Avoid eating large meals.  Avoid drinking large amounts of liquid with your meals.  Avoid eating meals during the 2-3 hours before bedtime.  Avoid lying down right after you eat.  Do not exercise right after you eat. Lifestyle      If you are overweight, lose an amount of weight that is healthy for you. Ask your doctor about a safe weight loss goal.  Do not use any products that contain nicotine or tobacco, including cigarettes, e-cigarettes, and chewing tobacco. These can make your symptoms worse. If you need help quitting, ask your doctor.  Wear loose clothes. Do not wear anything tight around your waist.  Raise (elevate) the head of your bed about 6 inches (15 cm) when you sleep.  Try to lower your stress. If you need help doing this, ask your doctor. General instructions  Pay attention to any changes in your symptoms.  Take over-the-counter and prescription medicines only as told by your doctor. ? Do not take aspirin, ibuprofen, or other NSAIDs unless your doctor says it is okay. ? Stop medicines only as told by your doctor.  Keep all follow-up visits as told by your doctor. This is important. Contact a doctor if:  You have new symptoms.  You lose weight and you do not know why it is happening.  You have trouble swallowing, or it hurts to swallow.  You have wheezing or a cough that keeps happening.  Your symptoms do not get better with treatment.  You have heartburn often for more than 2 weeks. Get help right away if:  You have pain in your arms, neck, jaw, teeth, or back.  You feel sweaty, dizzy, or light-headed.  You have chest pain or shortness of breath.  You throw up (vomit) and your throw up looks like blood or coffee grounds.  Your poop (stool) is bloody or black. These symptoms may represent a serious problem that is an emergency. Do not wait to see if the symptoms will go  away. Get medical help right away. Call your local emergency services (911 in the [place] Do not drive yourself to the hospital. Summary  Heartburn is a type of pain that can happen in the throat or chest. It can feel like a burning pain. It may also cause a bad, acid-like taste in the mouth.  You may need to avoid certain foods and drinks to help your symptoms. Ask  your doctor what foods and drinks you should avoid.  Take over-the-counter and prescription medicines only as told by your doctor. Do not take aspirin, ibuprofen, or other NSAIDs unless your doctor told you to do so.  Contact your doctor if your symptoms do not get better or they get worse. This information is not intended to replace advice given to you by your health care provider. Make sure you discuss any questions you have with your health care provider. Document Revised: 03/09/2018 Document Reviewed: 03/09/2018 Elsevier Patient Education  [place].

## 2021-01-02 NOTE — Assessment & Plan Note (Signed)
Past history - Diagnosed with asthma over 20 years ago. Currently on Advair 226mcg 2 puffs BID, Singulair daily and using albuterol as needed. Albuterol was causing palpitations but xopenex nebulizer caused throat tightness. 2021 spirometry was normal with no improvement in FEV1 post bronchodilator treatment. Clinically feeling the same. Interestingly patient was coughing after the treatment.  Interim history - stable.   Today's spirometry was normal.  . Daily controller medication(s): Advair 29mcg 2 puffs twice a day with spacer and rinse mouth afterwards. . Continue Singulair 10mg  daily.  . May use albuterol rescue inhaler 2 puffs every 4 to 6 hours as needed for shortness of breath, chest tightness, coughing, and wheezing. May use albuterol rescue inhaler 2 puffs 5 to 15 minutes prior to strenuous physical activities. Monitor frequency of use.  . Repeat spirometry at next visit.

## 2021-01-02 NOTE — Assessment & Plan Note (Signed)
Past history - frequent URIs and had 6 antibiotics this year. Being followed by hematology for low WBC.  Interim history - no additional infections. Did not get bloodwork drawn yet.  Marland Kitchen Keep track of infections. Get bloodwork.

## 2021-01-02 NOTE — Progress Notes (Signed)
Follow Up Note  RE: Molly Stone MRN: [id number] DOB: 04/08/1981 Date of Office Visit: 01/02/2021  Referring provider: Crecencio Mc, MD Primary care provider: Crecencio Mc, MD  Chief Complaint: covid-19 vaccine testing  Assessment and Plan: Molly Stone is a 40 y.o. female with: Immunization reaction Past history - Patient concerned about getting booster vaccine as she had some throat tightness with her first [place] vaccine and had more severe symptoms with whole body flushing and throat dryness after her second [place] vaccine. She has history of having issues with her throat though and is being followed by ENT for this. History of anxiety. Flu vaccines cause similar throat discomfort but no other symptoms. Morphine causes itching and hypotension.  Interim history -   Negative skin testing today to the COVID-19 vaccines.  Received the third (booster) Pfizer COVID-19 vaccine today in a graded vaccine method.  She did develop a slight throat clearing after the last dose was given but patient was not sure if it was due not taking her allergy medications and allergies bothering her and/or wearing the mask in the office.   She was treated with zyrtec 10mg . Vitals stable throughout and physical exam unremarkable.  The throat clearing improved and then patient was discharged.   For next 24 hours monitor for hives, swelling, shortness of breath and dizziness. If you see these symptoms, use Benadryl 25mg  to 50mg  for mild symptoms and epinephrine for more severe symptoms and call 911.  Patient was recently diagnosed with lupus but not on any medications - advised to follow up with rheumatology.  As of now patient is fully vaccinated and will discuss at next visit if she needs a fourth booster in the future.  Moderate persistent asthma without complication Past history - Diagnosed with asthma over 20 years ago. Currently on Advair 253mcg 2 puffs BID, Singulair daily and using albuterol  as needed. Albuterol was causing palpitations but xopenex nebulizer caused throat tightness. 2021 spirometry was normal with no improvement in FEV1 post bronchodilator treatment. Clinically feeling the same. Interestingly patient was coughing after the treatment.  Interim history - stable.   Today's spirometry was normal.  . Daily controller medication(s): Advair 243mcg 2 puffs twice a day with spacer and rinse mouth afterwards. . Continue Singulair 10mg  daily.  . May use albuterol rescue inhaler 2 puffs every 4 to 6 hours as needed for shortness of breath, chest tightness, coughing, and wheezing. May use albuterol rescue inhaler 2 puffs 5 to 15 minutes prior to strenuous physical activities. Monitor frequency of use.  . Repeat spirometry at next visit.   Seasonal and perennial allergic rhinoconjunctivitis Past history - Perennial rhino conjunctivitis symptoms for 30+ years. 3-4 sinus infections per year. Prior blood work was positive to dust mites, cat, dog, grass, cockroach, tree.  Borderline positive to ragweed and weed. 2021 skin testing showed: Positive to grass, weed, ragweed, trees, mold, dust mites, cat, dog, cockroach. Negative control slightly positive as well.  Interim history - symptoms worse since off antihistamines.  Continue environmental control measures as below.  May use over the counter antihistamines such as Zyrtec (cetirizine), Claritin (loratadine), Allegra (fexofenadine), or Xyzal (levocetirizine) daily as needed. May take twice a day during flares.  May use Flonase (fluticasone) nasal spray 1 spray per nostril twice a day as needed for nasal congestion.   May use azelastine nasal spray 1-2 sprays per nostril twice a day as needed for runny nose/drainage.   Nasal saline spray (i.e., Simply Saline) or  nasal saline lavage (i.e., NeilMed) is recommended as needed and prior to medicated nasal sprays.  Continue Singulair (montelukast) 10mg  daily at night.  If above regimen  does not control symptoms, will discuss allergy immunotherapy next - however patient lives 1 hour away.  History of frequent upper respiratory infection Past history - frequent URIs and had 6 antibiotics this year. Being followed by hematology for low WBC.  Interim history - no additional infections. Did not get bloodwork drawn yet.  Marland Kitchen Keep track of infections. Get bloodwork.  Heartburn Waxes and wanes.  Continue with reflux medication (protonix) and lifestyle diet as below.  Return in about 4 months (around 05/04/2021).  Plan: Challenge drug: covid-19 vaccines Challenge as per protocol: Passed Total time: 155 min  History of Present Illness: I had the pleasure of seeing Molly Stone for a follow up visit at [facility] on 01/02/2021. She is a 40 y.o. female, who is being followed for adverse drug reaction, allergic rhino conjunctivitis, adverse food reaction, asthma. Her previous allergy office visit was on 09/06/2020 with Dr. Maudie Mercury. Today she is here for covid-19 vaccine drug testing and challenge.   History of Reaction: Patient concerned about getting booster vaccine as she had some throat tightness with her first [place] vaccine and had more severe symptoms with whole body flushing and throat dryness after her second [place] vaccine. She has history of having issues with her throat though and is being followed by ENT for this. History of anxiety. Flu vaccines cause similar throat discomfort but no other symptoms. Morphine causes itching and hypotension.   No prior Covid-19 diagnosis.   Patient got the flu shot with no issues.   Patient was diagnosed with lupus since the last visit but not on any meds yet.   Allergic rhino conjunctivitis Takes zyrtec, benadryl, Singulair and nasal sprays with good benefit. The last few days symptoms worse since off antihistamines.  Adverse food reaction Not avoiding any foods and no reactions.   Moderate persistent  asthma Currently on Advair 224mcg 2 puffs BID, Singulair daily and using albuterol 3 times per week due to the coughing.   Denies any ER/urgent care visits or prednisone use since the last visit.  History of frequent upper respiratory infection No infections since the last visit.  Follows with hematology due to low WBC.  She got the pneumonia vaccine but no post vaccine bloodwork yet.   Heartburn Waxes and wanes.  Patient lives near [place] but prefers to come to our office for follow ups.   Interval History: Patient has not been ill, she has not had any accidental exposures to the culprit medication.   Recent/Current History: Pulmonary disease: yes - has asthma.  Cardiac disease: no Respiratory infection: no Rash: where she has been scratching Itch: yes Swelling: no Cough: yes Shortness of breath: no Runny/stuffy nose: no Itchy eyes: yes Beta-blocker use: no  Patient/guardian was informed of the test procedure with verbalized understanding of the risk of anaphylaxis. Consent was signed.   Last antihistamine use: none in the past 3 days Last beta-blocker use: n/a  Medication List:  Current Outpatient Medications  Medication Sig Dispense Refill  . ADVAIR HFA 230-21 MCG/ACT inhaler USE 2 INHALATIONS TWICE A DAY 36 g 3  . albuterol (VENTOLIN HFA) 108 (90 Base) MCG/ACT inhaler USE 2 INHALATIONS EVERY 6 HOURS AS NEEDED FOR WHEEZING OR SHORTNESS OF BREATH 8.5 g 10  . azelastine (ASTELIN) 0.1 % nasal spray Place 1-2 sprays  into both nostrils 2 (two) times daily as needed for rhinitis (nasal drainage). Use in each nostril as directed 90 mL 1  . Cetirizine HCl (ZYRTEC ALLERGY) 10 MG CAPS Take by mouth.    . Cholecalciferol (VITAMIN D3) 125 MCG (5000 UT) TABS Take 1 tablet by mouth daily.     . clindamycin-tretinoin (ZIANA) gel Apply 1 application topically daily.    Marland Kitchen EPINEPHrine 0.3 mg/0.3 mL IJ SOAJ injection See admin instructions. for allergic reaction    . fluticasone  (FLONASE) 50 MCG/ACT nasal spray Place 1 spray into both nostrils 2 (two) times daily as needed for allergies or rhinitis (nasal congestion). 48 g 1  . montelukast (SINGULAIR) 10 MG tablet TAKE 1 TABLET AT BEDTIME 90 tablet 3  . pantoprazole (PROTONIX) 40 MG tablet TAKE 1 TABLET TWICE A DAY 180 tablet 1  . propranolol (INDERAL) 10 MG tablet Take 1 tablet (10 mg total) by mouth 3 (three) times daily. Take only as needed for tachycardia 60 tablet 1  . sertraline (ZOLOFT) 100 MG tablet Take 1 tablet (100 mg total) by mouth at bedtime. 90 tablet 1  . ALPRAZolam (XANAX) 0.25 MG tablet Take 1 tablet (0.25 mg total) by mouth daily as needed. For panic attacks (Patient not taking: Reported on 01/02/2021) 30 tablet 1  . nebivolol (BYSTOLIC) 5 MG tablet Take 1 tablet (5 mg total) by mouth daily. (Patient not taking: Reported on 01/02/2021) 90 tablet 3   No current facility-administered medications for this visit.   Allergies: Allergies  Allergen Reactions  . Buprenorphine Hcl Itching and Other (See Comments)    And shaking  . Morphine And Related Itching and Other (See Comments)    And shaking  . Prednisone Anxiety and Palpitations  . Tramadol Hcl Other (See Comments)    anxious   I reviewed her past medical history, social history, family history, and environmental history and no significant changes have been reported from her previous visit.  Review of Systems  Constitutional: Negative for appetite change, chills, fever and unexpected weight change.  HENT: Negative for congestion and rhinorrhea.   Eyes: Positive for itching.  Respiratory: Positive for cough. Negative for chest tightness, shortness of breath and wheezing.   Cardiovascular: Negative for chest pain.  Gastrointestinal: Negative for abdominal pain.  Genitourinary: Negative for difficulty urinating.  Skin: Positive for rash.  Allergic/Immunologic: Positive for environmental allergies.   Objective: BP 118/80   Pulse (!) 102    Temp (!) 97.4 F (36.3 C) (Temporal)   Resp 18   Ht 5\' 9"  (1.753 m)   Wt (!) 305 lb (138.3 kg)   SpO2 95%   BMI 45.04 kg/m  Body mass index is 45.04 kg/m. Physical Exam Vitals and nursing note reviewed.  Constitutional:      Appearance: Normal appearance. She is well-developed. She is obese.  HENT:     Head: Normocephalic and atraumatic.     Right Ear: Tympanic membrane and external ear normal.     Left Ear: Tympanic membrane and external ear normal.     Nose: Nose normal.     Mouth/Throat:     Mouth: Mucous membranes are moist.     Pharynx: Oropharynx is clear.  Eyes:     Conjunctiva/sclera: Conjunctivae normal.  Cardiovascular:     Rate and Rhythm: Normal rate and regular rhythm.     Heart sounds: Normal heart sounds. No murmur heard. No friction rub. No gallop.   Pulmonary:     Effort: Pulmonary effort  is normal.     Breath sounds: Normal breath sounds. No wheezing, rhonchi or rales.  Musculoskeletal:     Cervical back: Neck supple.  Skin:    General: Skin is warm.  Neurological:     Mental Status: She is alert and oriented to person, place, and time.  Psychiatric:        Behavior: Behavior normal.    Diagnostics: Spirometry:  Tracings reviewed. Her effort: Good reproducible efforts. FVC: 3.73L FEV1: 3.36L, 95% predicted FEV1/FVC ratio: 90% Interpretation: Spirometry consistent with normal pattern.  Please see scanned spirometry results for details.  Skin prick testing: 1. Histamine - 2 2. Negative control - 0 3. Pfizer COVID-19 vaccine (1:1) - 0 4. Moderna COVID-19 vaccine (1:1) - 0 5. J&J COVID-19 vaccine (1:1) - 0  Intradermal testing: 1. Negative control - 0 2. Pfizer COVID-19 vaccine (1:100) - 0 3. Moderna COVID-19 vaccine (1:100) - 0 4. J&J COVID-19 vaccine (1:100) - 0  5. Pfizer COVID-19 vaccine (1:10) - 0 6. Moderna COVID-19 vaccine (1:10) - 0 7. J&J COVID-19 vaccine (1:10) - 0   Graded vaccine challenge dose: 1. Pfizer COVID-19 vaccine  0.35mL 2. Pfizer COVID-19 vaccine 0.49mL Results discussed with patient/family.  Previous notes and tests were reviewed. The plan was reviewed with the patient/family, and all questions/concerned were addressed.  It was my pleasure to see Molly Stone today and participate in her care. Please feel free to contact me with any questions or concerns.  Sincerely,  Rexene Alberts, DO Allergy & Immunology  [facility] office: [place] office: [phone]

## 2021-01-02 NOTE — Assessment & Plan Note (Signed)
Past history - Perennial rhino conjunctivitis symptoms for 30+ years. 3-4 sinus infections per year. Prior blood work was positive to dust mites, cat, dog, grass, cockroach, tree.  Borderline positive to ragweed and weed. 2021 skin testing showed: Positive to grass, weed, ragweed, trees, mold, dust mites, cat, dog, cockroach. Negative control slightly positive as well.  Interim history - symptoms worse since off antihistamines.  Continue environmental control measures as below.  May use over the counter antihistamines such as Zyrtec (cetirizine), Claritin (loratadine), Allegra (fexofenadine), or Xyzal (levocetirizine) daily as needed. May take twice a day during flares.  May use Flonase (fluticasone) nasal spray 1 spray per nostril twice a day as needed for nasal congestion.   May use azelastine nasal spray 1-2 sprays per nostril twice a day as needed for runny nose/drainage.   Nasal saline spray (i.e., Simply Saline) or nasal saline lavage (i.e., NeilMed) is recommended as needed and prior to medicated nasal sprays.  Continue Singulair (montelukast) 10mg  daily at night.  If above regimen does not control symptoms, will discuss allergy immunotherapy next - however patient lives 1 hour away.

## 2021-01-02 NOTE — Progress Notes (Signed)
   Covid-19 Vaccination Clinic  Name:  Molly Stone    MRN: [id number] DOB: [birth year]  01/02/2021  Molly Stone was observed post Covid-19 immunization for 30 minutes based on pre-vaccination screening without incident. She was provided with Vaccine Information Sheet and instruction to access the V-Safe system.   Molly Stone was instructed to call 911 with any severe reactions post vaccine: Marland Kitchen Difficulty breathing  . Swelling of face and throat  . A fast heartbeat  . A bad rash all over body  . Dizziness and weakness   Immunizations Administered    Name Date Dose VIS Date Route   PFIZER Comrnaty(Gray TOP) Covid-19 Vaccine 01/02/2021 11:00 AM 0.3 mL 09/28/2020 Intramuscular   Manufacturer: [employer]   Lot: [id number]   NDC: [phone]

## 2021-01-02 NOTE — Assessment & Plan Note (Signed)
Waxes and wanes.  Continue with reflux medication (protonix) and lifestyle diet as below.

## 2021-01-05 ENCOUNTER — Telehealth: Payer: Self-pay | Admitting: Allergy

## 2021-01-05 NOTE — Telephone Encounter (Signed)
Patient had her Covid vaccine on 01/02/21 in [place]. She called today, 01/05/21 and said her whole body aches and she is fatigued and wanted to know if this was normal.  I asked Romeo Apple if this was normal. She said yes, but if the patient did not feel better a week from her shot, to give [place] a call back. Patient verbalized understanding.

## 2021-01-12 ENCOUNTER — Encounter: Payer: Self-pay | Admitting: Internal Medicine

## 2021-01-12 ENCOUNTER — Telehealth (INDEPENDENT_AMBULATORY_CARE_PROVIDER_SITE_OTHER): Payer: Commercial Managed Care - PPO | Admitting: Internal Medicine

## 2021-01-12 ENCOUNTER — Other Ambulatory Visit: Payer: Self-pay

## 2021-01-12 VITALS — Ht 69.0 in | Wt 302.0 lb

## 2021-01-12 DIAGNOSIS — J4521 Mild intermittent asthma with (acute) exacerbation: Secondary | ICD-10-CM | POA: Diagnosis not present

## 2021-01-12 MED ORDER — PREDNISONE 10 MG PO TABS: 10.0000 mg | ORAL_TABLET | Freq: Every day | ORAL | 0 refills | Status: DC

## 2021-01-12 MED ORDER — AZITHROMYCIN 250 MG PO TABS: ORAL_TABLET | ORAL | 0 refills | Status: DC

## 2021-01-12 NOTE — Progress Notes (Signed)
Onset of symptoms for a few days now, cough and chest congestion. Patient has history of allergies, no one around the Patient sick.

## 2021-01-12 NOTE — Progress Notes (Signed)
Virtual Visit via Video Note  I connected with Molly Stone  on 01/12/21 at  7:30 AM EDT by a video enabled telemedicine application and verified that I am speaking with the correct person using two identifiers.  Location patient: home, [place] Location provider:work or home office Persons participating in the virtual visit: patient, provider  I discussed the limitations of evaluation and management by telemedicine and the availability of in person appointments. The patient expressed understanding and agreed to proceed.   HPI: 1. Asthma exacerbation with cough, chest congestion since Monday/Tuesday, runny nose, PNd with h/o allergies which flare now. She had to be off allergy meds 01/02/21 due to vaccine component test with allergy. She is on advair 230--21, ventolin, astelin, zyrtec and singulair and still having sx's. Prior SL drops with ENT did not help with allergies  Will disc with allergist about xolair, fasenra/dupixent for patient to control sxs.  Denies covid exposure   -COVID-19 vaccine status:  ROS: See pertinent positives and negatives per HPI.  Past Medical History:  Diagnosis Date  . Allergy   . Anemia   . Anxiety   . Asthma   . Chlamydia    h/o  . DDD (degenerative disc disease), cervical 04/07/2019  . Disorder of vocal cord    spasmodic dysphonia  . Dysmenorrhea   . Dyspareunia, female   . Endometriosis   . Family history of endometriosis   . GERD (gastroesophageal reflux disease)   . Headache    migraine  . Heavy periods   . Hiatal hernia   . History of nephrolithiasis   . Hypertension   . Jaundice, physiologic, newborn   . Lupus ([place])   . Obesity (BMI 30-39.9)   . Sleep apnea   . Tobacco user   . Vaginal Pap smear, abnormal     Past Surgical History:  Procedure Laterality Date  . BONE MARROW BIOPSY    . DILATATION & CURETTAGE/HYSTEROSCOPY WITH MYOSURE N/A 08/27/2018   Procedure: DILATATION & CURETTAGE/HYSTEROSCOPY WITH MINERVA;  Surgeon: Gae Dry, MD;  Location: [facility] ORS;  Service: Gynecology;  Laterality: N/A;  . DILATION AND CURETTAGE OF UTERUS    . ESOPHAGOGASTRODUODENOSCOPY (EGD) WITH PROPOFOL N/A 08/07/2018   Procedure: ESOPHAGOGASTRODUODENOSCOPY (EGD) WITH PROPOFOL;  Surgeon: Jonathon Bellows, MD;  Location: [facility] ENDOSCOPY;  Service: Gastroenterology;  Laterality: N/A;  . laparoscopy    . ROOT CANAL    . WISDOM TOOTH EXTRACTION       Current Outpatient Medications:  .  ADVAIR HFA 379-02 MCG/ACT inhaler, USE 2 INHALATIONS TWICE A DAY, Disp: 36 g, Rfl: 3 .  azelastine (ASTELIN) 0.1 % nasal spray, Place 1-2 sprays into both nostrils 2 (two) times daily as needed for rhinitis (nasal drainage). Use in each nostril as directed, Disp: 90 mL, Rfl: 1 .  azithromycin (ZITHROMAX) 250 MG tablet, 2 pills day 1 and 1 pill day 2-5 with food, Disp: 6 tablet, Rfl: 0 .  Cetirizine HCl (ZYRTEC ALLERGY) 10 MG CAPS, Take by mouth., Disp: , Rfl:  .  Cholecalciferol (VITAMIN D3) 125 MCG (5000 UT) TABS, Take 1 tablet by mouth daily. , Disp: , Rfl:  .  clindamycin-tretinoin (ZIANA) gel, Apply 1 application topically daily., Disp: , Rfl:  .  EPINEPHrine 0.3 mg/0.3 mL IJ SOAJ injection, See admin instructions. for allergic reaction, Disp: , Rfl:  .  fluticasone (FLONASE) 50 MCG/ACT nasal spray, Place 1 spray into both nostrils 2 (two) times daily as needed for allergies or rhinitis (nasal congestion)., Disp: 48  g, Rfl: 1 .  montelukast (SINGULAIR) 10 MG tablet, TAKE 1 TABLET AT BEDTIME, Disp: 90 tablet, Rfl: 3 .  nebivolol (BYSTOLIC) 5 MG tablet, Take 1 tablet (5 mg total) by mouth daily., Disp: 90 tablet, Rfl: 3 .  pantoprazole (PROTONIX) 40 MG tablet, TAKE 1 TABLET TWICE A DAY, Disp: 180 tablet, Rfl: 1 .  predniSONE (DELTASONE) 10 MG tablet, Take 1 tablet (10 mg total) by mouth daily with breakfast. x7-10 days in the am, Disp: 10 tablet, Rfl: 0 .  sertraline (ZOLOFT) 100 MG tablet, Take 1 tablet (100 mg total) by mouth at bedtime., Disp: 90 tablet,  Rfl: 1 .  albuterol (VENTOLIN HFA) 108 (90 Base) MCG/ACT inhaler, USE 2 INHALATIONS EVERY 6 HOURS AS NEEDED FOR WHEEZING OR SHORTNESS OF BREATH (Patient not taking: Reported on 01/12/2021), Disp: 8.5 g, Rfl: 10 .  ALPRAZolam (XANAX) 0.25 MG tablet, Take 1 tablet (0.25 mg total) by mouth daily as needed. For panic attacks (Patient not taking: No sig reported), Disp: 30 tablet, Rfl: 1 .  propranolol (INDERAL) 10 MG tablet, Take 1 tablet (10 mg total) by mouth 3 (three) times daily. Take only as needed for tachycardia (Patient not taking: Reported on 01/12/2021), Disp: 60 tablet, Rfl: 1  EXAM:  VITALS per patient if applicable:  GENERAL: alert, oriented, appears well and in no acute distress  HEENT: atraumatic, conjunttiva clear, no obvious abnormalities on inspection of external nose and ears  NECK: normal movements of the head and neck  LUNGS: on inspection no signs of respiratory distress, breathing rate appears normal, no obvious gross SOB, gasping or wheezing +cough on exam   CV: no obvious cyanosis  MS: moves all visible extremities without noticeable abnormality  PSYCH/NEURO: pleasant and cooperative, no obvious depression or anxiety, speech and thought processing grossly intact  ASSESSMENT AND PLAN:  Discussed the following assessment and plan:  Mild intermittent asthma with exacerbation - Plan: azithromycin (ZITHROMAX) 250 MG tablet, predniSONE (DELTASONE) 10 MG tablet  She is on advair 230--21, ventolin, astelin, zyrtec and singulair and still having sx's. Prior SL drops with ENT did not help with allergies  Will disc with allergist about xolair, fasenra/dupixent for patient to control sxs.  Continue above stated meds  F/u with allergy   -we discussed possible serious and likely etiologies, options for evaluation and workup, limitations of telemedicine visit vs in person visit, treatment, treatment risks and precautions.   Thursdays for [place]. Advised to schedule follow up  visit with PCP or [facility] if any further questions or concerns to avoid delays in care.   I discussed the assessment and treatment plan with the patient. The patient was provided an opportunity to ask questions and all were answered. The patient agreed with the plan and demonstrated an understanding of the instructions.    Time spent 20 min Delorise Jackson, MD

## 2021-01-13 ENCOUNTER — Other Ambulatory Visit: Payer: Self-pay | Admitting: Internal Medicine

## 2021-01-15 NOTE — Telephone Encounter (Signed)
RX Refill:xanax Last Seen:12-08-20 Last ordered:10-16-20

## 2021-01-29 ENCOUNTER — Encounter: Payer: Self-pay | Admitting: Internal Medicine

## 2021-01-29 ENCOUNTER — Telehealth: Payer: Commercial Managed Care - PPO | Admitting: Internal Medicine

## 2021-01-29 DIAGNOSIS — F411 Generalized anxiety disorder: Secondary | ICD-10-CM
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
## 2021-01-30 ENCOUNTER — Encounter: Payer: Self-pay | Admitting: Internal Medicine
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
## 2021-03-02 LAB — STREP PNEUMONIAE 23 SEROTYPES IGG
Pneumo Ab Type 1*: >15.2 ug/mL (ref >1.3)
Pneumo Ab Type 12 (12F)*: 0.3 ug/mL — ABNORMAL LOW (ref >1.3)
Pneumo Ab Type 14*: >30.6 ug/mL (ref >1.3)
Pneumo Ab Type 17 (17F)*: 1.3 ug/mL — ABNORMAL LOW (ref >1.3)
Pneumo Ab Type 19 (19F)*: 8.4 ug/mL (ref >1.3)
Pneumo Ab Type 2*: 12 ug/mL (ref >1.3)
Pneumo Ab Type 20*: 7.6 ug/mL (ref >1.3)
Pneumo Ab Type 22 (22F)*: 4 ug/mL (ref >1.3)
Pneumo Ab Type 23 (23F)*: 4.6 ug/mL (ref >1.3)
Pneumo Ab Type 26 (6B)*: 16 ug/mL (ref >1.3)
Pneumo Ab Type 3*: 17.2 ug/mL (ref >1.3)
Pneumo Ab Type 34 (10A)*: 2.1 ug/mL (ref >1.3)
Pneumo Ab Type 4*: 2 ug/mL (ref >1.3)
Pneumo Ab Type 43 (11A)*: 1.2 ug/mL — ABNORMAL LOW (ref >1.3)
Pneumo Ab Type 5*: 39.6 ug/mL (ref >1.3)
Pneumo Ab Type 51 (7F)*: 8.7 ug/mL (ref >1.3)
Pneumo Ab Type 54 (15B)*: 1.1 ug/mL — ABNORMAL LOW (ref >1.3)
Pneumo Ab Type 56 (18C)*: >14.4 ug/mL (ref >1.3)
Pneumo Ab Type 57 (19A)*: 8.3 ug/mL (ref >1.3)
Pneumo Ab Type 68 (9V)*: >18.2 ug/mL (ref >1.3)
Pneumo Ab Type 70 (33F)*: 0.9 ug/mL — ABNORMAL LOW (ref >1.3)
Pneumo Ab Type 8*: 0.9 ug/mL — ABNORMAL LOW (ref >1.3)
Pneumo Ab Type 9 (9N)*: 0.6 ug/mL — ABNORMAL LOW (ref >1.3)

## 2021-03-02 LAB — IGG 1, 2, 3, AND 4
IgG (Immunoglobin G), Serum: 1481 mg/dL (ref 586–1602)
IgG, Subclass 1: 735 mg/dL (ref 248–810)
IgG, Subclass 2: 617 mg/dL — ABNORMAL HIGH (ref 130–555)
IgG, Subclass 3: 107 mg/dL — ABNORMAL HIGH (ref 15–102)
IgG, Subclass 4: 14 mg/dL (ref 2–96)

## 2021-03-02 LAB — IGG, IGA, IGM
IgA/Immunoglobulin A, Serum: 359 mg/dL — ABNORMAL HIGH (ref 87–352)
IgM (Immunoglobulin M), Srm: 124 mg/dL (ref 26–217)

## 2021-03-06 ENCOUNTER — Other Ambulatory Visit: Payer: Self-pay | Admitting: Gastroenterology

## 2021-03-23 ENCOUNTER — Telehealth: Payer: Self-pay | Admitting: Internal Medicine

## 2021-03-23 NOTE — Telephone Encounter (Signed)
Patient made an virtual appointment for 03/26/21 @ 4:30pm. She said her allergies are bothering her. Patient declined Print production planner. She said she wanted a virtual with Dr Lupita Dawn office, appointment made.

## 2021-03-23 NOTE — Telephone Encounter (Signed)
Noted  

## 2021-03-26 ENCOUNTER — Encounter: Payer: Self-pay | Admitting: Family Medicine

## 2021-03-26 ENCOUNTER — Telehealth: Payer: Commercial Managed Care - PPO | Admitting: Family Medicine

## 2021-03-26 ENCOUNTER — Other Ambulatory Visit: Payer: Self-pay

## 2021-03-26 ENCOUNTER — Other Ambulatory Visit: Payer: Commercial Managed Care - PPO

## 2021-03-26 ENCOUNTER — Telehealth: Payer: Commercial Managed Care - PPO | Admitting: Adult Health

## 2021-03-26 VITALS — Ht 69.0 in | Wt 305.0 lb

## 2021-03-26 DIAGNOSIS — R059 Cough, unspecified: Secondary | ICD-10-CM | POA: Diagnosis not present

## 2021-03-26 LAB — POC COVID19 BINAXNOW: SARS Coronavirus 2 Ag: NEGATIVE

## 2021-03-26 MED ORDER — AZITHROMYCIN 250 MG PO TABS: ORAL_TABLET | ORAL | 0 refills | Status: AC

## 2021-03-26 NOTE — Progress Notes (Signed)
Virtual Visit via video Note  This visit type was conducted due to national recommendations for restrictions regarding the COVID-19 pandemic (e.g. social distancing).  This format is felt to be most appropriate for this patient at this time.  All issues noted in this document were discussed and addressed.  No physical exam was performed (except for noted visual exam findings with Video Visits).   I connected with Molly Stone today at  3:15 PM EDT by a video enabled telemedicine application and verified that I am speaking with the correct person using two identifiers. Location patient: home Location provider: work Persons participating in the virtual visit: patient, provider  I discussed the limitations, risks, security and privacy concerns of performing an evaluation and management service by telephone and the availability of in person appointments. I also discussed with the patient that there may be a patient responsible charge related to this service. The patient expressed understanding and agreed to proceed.  Reason for visit: Same-day visit.  HPI: Cough: Patient notes this started 4 days ago.  Started with typical bad allergy symptoms and has moved down to her chest with cough productive of clear/yellow mucus.  She notes sinus pressure and congestion.  Some headaches.  No fever or sore throat though does have postnasal drip.  No shortness of breath, loss of taste or smell, or COVID exposures.  She has not tested for COVID.  She is vaccinated and boosted.  She has tried TheraFlu, her typical allergy medicines, and Mucinex.   ROS: See pertinent positives and negatives per HPI.  Past Medical History:  Diagnosis Date  . Allergy   . Anemia   . Anxiety   . Asthma   . Chlamydia    h/o  . Concussion without loss of consciousness 08/04/2016  . DDD (degenerative disc disease), cervical 04/07/2019  . Disorder of vocal cord    spasmodic dysphonia  . Dysmenorrhea   . Dyspareunia, female    . Endometriosis   . Family history of endometriosis   . GERD (gastroesophageal reflux disease)   . Headache    migraine  . Heavy periods   . Hiatal hernia   . History of nephrolithiasis   . Hypertension   . Jaundice, physiologic, newborn   . Lupus ([place])   . Obesity (BMI 30-39.9)   . Sleep apnea   . Tobacco user   . Vaginal Pap smear, abnormal     Past Surgical History:  Procedure Laterality Date  . BONE MARROW BIOPSY    . DILATATION & CURETTAGE/HYSTEROSCOPY WITH MYOSURE N/A 08/27/2018   Procedure: DILATATION & CURETTAGE/HYSTEROSCOPY WITH MINERVA;  Surgeon: Gae Dry, MD;  Location: [facility] ORS;  Service: Gynecology;  Laterality: N/A;  . DILATION AND CURETTAGE OF UTERUS    . ESOPHAGOGASTRODUODENOSCOPY (EGD) WITH PROPOFOL N/A 08/07/2018   Procedure: ESOPHAGOGASTRODUODENOSCOPY (EGD) WITH PROPOFOL;  Surgeon: Jonathon Bellows, MD;  Location: [facility] ENDOSCOPY;  Service: Gastroenterology;  Laterality: N/A;  . laparoscopy    . ROOT CANAL    . WISDOM TOOTH EXTRACTION      Family History  Problem Relation Age of Onset  . Endometriosis Mother   . Allergic rhinitis Mother   . Cancer Father        benign liver CA  . Stroke Father        recurrent blood clots on the brain   . Lupus Father   . Cancer Maternal Grandmother 30       ovarian Ca, still surviving   . Osteoporosis  Maternal Grandmother   . Ovarian cancer Maternal Grandmother   . Heart disease Maternal Grandfather   . Cancer Maternal Grandfather 9       colon CA. metastatic  . Diabetes Maternal Uncle   . Heart disease Paternal Grandmother   . CAD Brother 59       early at age 73  . Immunodeficiency Brother   . Breast cancer Cousin   . Colon cancer Neg Hx   . Kidney cancer Neg Hx   . Bladder Cancer Neg Hx   . Angioedema Neg Hx   . Atopy Neg Hx   . Asthma Neg Hx   . Eczema Neg Hx   . Urticaria Neg Hx     SOCIAL HX: Smoker   Current Outpatient Medications:  .  ADVAIR HFA 338-32 MCG/ACT inhaler, USE 2  INHALATIONS TWICE A DAY, Disp: 36 g, Rfl: 3 .  albuterol (VENTOLIN HFA) 108 (90 Base) MCG/ACT inhaler, USE 2 INHALATIONS EVERY 6 HOURS AS NEEDED FOR WHEEZING OR SHORTNESS OF BREATH, Disp: 8.5 g, Rfl: 10 .  ALPRAZolam (XANAX) 0.25 MG tablet, TAKE 1 TABLET BY MOUTH AS NEEDED FOR PANIC ATTACKS, Disp: 30 tablet, Rfl: 5 .  azelastine (ASTELIN) 0.1 % nasal spray, Place 1-2 sprays into both nostrils 2 (two) times daily as needed for rhinitis (nasal drainage). Use in each nostril as directed, Disp: 90 mL, Rfl: 1 .  azithromycin (ZITHROMAX) 250 MG tablet, Take 2 tablets on day 1, then 1 tablet daily on days 2 through 5, Disp: 6 tablet, Rfl: 0 .  Cetirizine HCl (ZYRTEC ALLERGY) 10 MG CAPS, Take by mouth., Disp: , Rfl:  .  Cholecalciferol (VITAMIN D3) 125 MCG (5000 UT) TABS, Take 1 tablet by mouth daily. , Disp: , Rfl:  .  clindamycin-tretinoin (ZIANA) gel, Apply 1 application topically daily., Disp: , Rfl:  .  EPINEPHrine 0.3 mg/0.3 mL IJ SOAJ injection, See admin instructions. for allergic reaction, Disp: , Rfl:  .  fluticasone (FLONASE) 50 MCG/ACT nasal spray, Place 1 spray into both nostrils 2 (two) times daily as needed for allergies or rhinitis (nasal congestion)., Disp: 48 g, Rfl: 1 .  montelukast (SINGULAIR) 10 MG tablet, TAKE 1 TABLET AT BEDTIME, Disp: 90 tablet, Rfl: 3 .  nebivolol (BYSTOLIC) 5 MG tablet, Take 1 tablet (5 mg total) by mouth daily., Disp: 90 tablet, Rfl: 3 .  pantoprazole (PROTONIX) 40 MG tablet, TAKE 1 TABLET TWICE A DAY, Disp: 180 tablet, Rfl: 1 .  propranolol (INDERAL) 10 MG tablet, Take 1 tablet (10 mg total) by mouth 3 (three) times daily. Take only as needed for tachycardia, Disp: 60 tablet, Rfl: 1 .  sertraline (ZOLOFT) 100 MG tablet, Take 1 tablet (100 mg total) by mouth at bedtime., Disp: 90 tablet, Rfl: 1  EXAM:  VITALS per patient if applicable:  GENERAL: alert, oriented, appears well and in no acute distress  HEENT: atraumatic, conjunttiva clear, no obvious  abnormalities on inspection of external nose and ears  NECK: normal movements of the head and neck  LUNGS: on inspection no signs of respiratory distress, breathing rate appears normal, no obvious gross SOB, gasping or wheezing  CV: no obvious cyanosis  MS: moves all visible extremities without noticeable abnormality  PSYCH/NEURO: pleasant and cooperative, no obvious depression or anxiety, speech and thought processing grossly intact  ASSESSMENT AND PLAN:  Discussed the following assessment and plan:  Problem List Items Addressed This Visit    Cough - Primary    Symptoms are concerning for  a bronchitis versus upper respiratory infection versus COVID-19.  We will have her tested for COVID-19 through the office.  She will remain quarantined at home at least until we have the test results back.  Given concern for bronchitis we will go and start her on a Z-Pak.  She will seek medical attention for shortness of breath, cough productive of blood, elevated fevers, or any worsening symptoms.      Relevant Medications   azithromycin (ZITHROMAX) 250 MG tablet   Other Relevant Orders   Novel Coronavirus, NAA ([facility])      No follow-ups on file.   I discussed the assessment and treatment plan with the patient. The patient was provided an opportunity to ask questions and all were answered. The patient agreed with the plan and demonstrated an understanding of the instructions.   The patient was advised to call back or seek an in-person evaluation if the symptoms worsen or if the condition fails to improve as anticipated.   Tommi Rumps, MD

## 2021-03-26 NOTE — Addendum Note (Signed)
Addended by: Leeanne Rio on: 03/26/2021 03:44 PM   Modules accepted: Orders

## 2021-03-26 NOTE — Assessment & Plan Note (Signed)
Symptoms are concerning for a bronchitis versus upper respiratory infection versus COVID-19.  We will have her tested for COVID-19 through the office.  She will remain quarantined at home at least until we have the test results back.  Given concern for bronchitis we will go and start her on a Z-Pak.  She will seek medical attention for shortness of breath, cough productive of blood, elevated fevers, or any worsening symptoms.

## 2021-03-26 NOTE — Addendum Note (Signed)
Addended by: Leeanne Rio on: 03/26/2021 03:43 PM   Modules accepted: Orders

## 2021-03-28 LAB — NOVEL CORONAVIRUS, NAA: SARS-CoV-2, NAA: NOT DETECTED

## 2021-03-28 LAB — SARS-COV-2, NAA 2 DAY TAT

## 2021-04-03 ENCOUNTER — Other Ambulatory Visit: Payer: Self-pay | Admitting: *Deleted

## 2021-04-03 ENCOUNTER — Inpatient Hospital Stay (HOSPITAL_BASED_OUTPATIENT_CLINIC_OR_DEPARTMENT_OTHER): Payer: Commercial Managed Care - PPO | Admitting: Oncology

## 2021-04-03 ENCOUNTER — Encounter: Payer: Self-pay | Admitting: Oncology

## 2021-04-03 ENCOUNTER — Inpatient Hospital Stay: Payer: Commercial Managed Care - PPO | Attending: Oncology

## 2021-04-03 DIAGNOSIS — G4733 Obstructive sleep apnea (adult) (pediatric): Secondary | ICD-10-CM | POA: Diagnosis not present

## 2021-04-03 DIAGNOSIS — D709 Neutropenia, unspecified: Secondary | ICD-10-CM

## 2021-04-03 DIAGNOSIS — Z79899 Other long term (current) drug therapy: Secondary | ICD-10-CM | POA: Insufficient documentation

## 2021-04-03 DIAGNOSIS — D708 Other neutropenia: Secondary | ICD-10-CM | POA: Diagnosis not present

## 2021-04-03 LAB — CBC WITH DIFFERENTIAL/PLATELET
Abs Immature Granulocytes: 0.01 10*3/uL (ref 0.00–0.07)
Basophils Absolute: 0 10*3/uL (ref 0.0–0.1)
Basophils Relative: 1 %
Eosinophils Absolute: 0.6 10*3/uL — ABNORMAL HIGH (ref 0.0–0.5)
Eosinophils Relative: 13 %
HCT: 41.9 % (ref 36.0–46.0)
Hemoglobin: 14.1 g/dL (ref 12.0–15.0)
Immature Granulocytes: 0 %
Lymphocytes Relative: 45 %
Lymphs Abs: 2 10*3/uL (ref 0.7–4.0)
MCH: 30.9 pg (ref 26.0–34.0)
MCHC: 33.7 g/dL (ref 30.0–36.0)
MCV: 91.9 fL (ref 80.0–100.0)
Monocytes Absolute: 0.6 10*3/uL (ref 0.1–1.0)
Monocytes Relative: 14 %
Neutro Abs: 1.2 10*3/uL — ABNORMAL LOW (ref 1.7–7.7)
Neutrophils Relative %: 27 %
Platelets: 173 10*3/uL (ref 150–400)
RBC: 4.56 MIL/uL (ref 3.87–5.11)
RDW: 14 % (ref 11.5–15.5)
WBC: 4.5 10*3/uL (ref 4.0–10.5)
nRBC: 0 % (ref 0.0–0.2)

## 2021-04-03 NOTE — Progress Notes (Signed)
I connected with Molly Stone on 04/03/21 at  2:30 PM EDT by video enabled telemedicine visit and verified that I am speaking with the correct person using two identifiers.   I discussed the limitations, risks, security and privacy concerns of performing an evaluation and management service by telemedicine and the availability of in-person appointments. I also discussed with the patient that there may be a patient responsible charge related to this service. The patient expressed understanding and agreed to proceed.  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  Reports occasional chest pain which lasts for a few minutes and then resolves on its own.  Denies any fever.  Appetite and weight have remained stable.  Denies any history of autoimmune disorders.  Looking back at her prior CBCs. Patient has had a normal white cell count with a normal differential up until 2019 when she was noted to have leukopenia for the first time in November 2019.  On 09/11/2018 her white count was 3.1 with an [place] of 1.  Following that her white cell count normalized but was again found to be low in June 2020 when her white count was 3.2 with an ANC of 0.9.  ANC improved to 1.3 in September 2020 but since then her [place] has been trending down to 800 in April 2021 and 700 on 03/09/2020.  Hemoglobin is always remained stable between 13-14 and platelets have been normal.  She had some initial work-up done by Dr. Derrel Nip and was found to have borderline low B12 levels of 287.  Copper levels and folate was normal.  Iron studies  were within normal limits.  She also had HIV testing and hep B and hep C testing which was all negative.   Results of blood work from 03/27/2020 were as follows: CBC showed white count of 2.4, H&H of 14.1/41.7 and a platelet count of 191.  ANC was low at 500.  Flow cytometry was unable to evaluate B-cell for allergy due to nonspecific light chain binding.  ANA comprehensive panel was negative.  Pathology smear review showed relative neutropenia.  Normal erythrocytes and normal platelets.   Bone marrow biopsy showed normocellular marrow with trilineage hematopoiesis including abundant granulocytic cells and mature neutrophils with nonspecific changes.  No increase in blastic cells.  No evidence of lymphoproliferative disorder.  In the setting peripheral neutropenia may be related to medication immune mediated process flow cytometry on the marrow was normal.    Interval history Patient continues to have fatigue, low exercise tolerance. She was seen by rheumatology and thought to have possible connective tissue disorder but has not been started on plaquenil yet   Review of Systems  Constitutional:  Positive for malaise/fatigue. Negative for chills, fever and weight loss.  HENT:  Negative for congestion, ear discharge and nosebleeds.   Eyes:  Negative for blurred vision.  Respiratory:  Negative for cough, hemoptysis, sputum production, shortness of breath and wheezing.   Cardiovascular:  Negative for chest pain, palpitations, orthopnea and claudication.  Gastrointestinal:  Negative for abdominal pain, blood in stool, constipation, diarrhea, heartburn, melena, nausea and vomiting.  Genitourinary:  Negative for dysuria, flank pain, frequency,  hematuria and urgency.  Musculoskeletal:  Negative for back pain, joint pain and myalgias.  Skin:  Negative for rash.  Neurological:  Negative for dizziness, tingling, focal weakness, seizures, weakness and headaches.  Endo/Heme/Allergies:  Does not bruise/bleed  easily.  Psychiatric/Behavioral:  Negative for depression and suicidal ideas. The patient does not have insomnia.    Allergies  Allergen Reactions   Buprenorphine Hcl Itching and Other (See Comments)    And shaking   Morphine And Related Itching and Other (See Comments)    And shaking   Prednisone Anxiety and Palpitations   Tramadol Hcl Other (See Comments)    anxious    Past Medical History:  Diagnosis Date   Allergy    Anemia    Anxiety    Asthma    Chlamydia    h/o   Concussion without loss of consciousness 08/04/2016   DDD (degenerative disc disease), cervical 04/07/2019   Disorder of vocal cord    spasmodic dysphonia   Dysmenorrhea    Dyspareunia, female    Endometriosis    Family history of endometriosis    GERD (gastroesophageal reflux disease)    Headache    migraine   Heavy periods    Hiatal hernia    History of nephrolithiasis    Hypertension    Jaundice, physiologic, newborn    Lupus (HCC)    Obesity (BMI 30-39.9)    Sleep apnea    Tobacco user    Vaginal Pap smear, abnormal     Past Surgical History:  Procedure Laterality Date   BONE MARROW BIOPSY     DILATATION & CURETTAGE/HYSTEROSCOPY WITH MYOSURE N/A 08/27/2018   Procedure: DILATATION & CURETTAGE/HYSTEROSCOPY WITH MINERVA;  Surgeon: Gae Dry, MD;  Location: [facility] ORS;  Service: Gynecology;  Laterality: N/A;   DILATION AND CURETTAGE OF UTERUS     ESOPHAGOGASTRODUODENOSCOPY (EGD) WITH PROPOFOL N/A 08/07/2018   Procedure: ESOPHAGOGASTRODUODENOSCOPY (EGD) WITH PROPOFOL;  Surgeon: Jonathon Bellows, MD;  Location: [facility] ENDOSCOPY;  Service: Gastroenterology;  Laterality: N/A;   laparoscopy     ROOT CANAL     WISDOM TOOTH EXTRACTION      Social History   Socioeconomic History   Marital status: Married    Spouse name: Not on file   Number of children: Not on file   Years of education: Not on file   Highest education level: Not on file  Occupational History   Not on file  Tobacco Use   Smoking  status: Every Day    Packs/day: 0.50    Years: 10.00    Pack years: 5.00    Types: Cigarettes   Smokeless tobacco: Never  Vaping Use   Vaping Use: Former  Substance and Sexual Activity   Alcohol use: Yes    Alcohol/week: 0.0 standard drinks    Comment: rarely   Drug use: No   Sexual activity: Yes    Birth control/protection: None    Comment: vasectomy  Other Topics Concern   Not on file  Social History Narrative   Not on file   Social Determinants of Health   Financial Resource Strain: Not on file  Food Insecurity: Not on file  Transportation Needs: Not on file  Physical Activity: Not on file  Stress: Not on file  Social Connections: Not on file  Intimate Partner Violence: Not on file    Family History  Problem Relation Age of Onset   Endometriosis Mother    Allergic rhinitis Mother    Cancer Father  benign liver CA   Stroke Father        recurrent blood clots on the brain    Lupus Father    Cancer Maternal Grandmother 49       ovarian Ca, still surviving    Osteoporosis Maternal Grandmother    Ovarian cancer Maternal Grandmother    Heart disease Maternal Grandfather    Cancer Maternal Grandfather 71       colon CA. metastatic   Diabetes Maternal Uncle    Heart disease Paternal Grandmother    CAD Brother 13       early at age 77   Immunodeficiency Brother    Breast cancer Cousin    Colon cancer Neg Hx    Kidney cancer Neg Hx    Bladder Cancer Neg Hx    Angioedema Neg Hx    Atopy Neg Hx    Asthma Neg Hx    Eczema Neg Hx    Urticaria Neg Hx      Current Outpatient Medications:    ADVAIR HFA 230-21 MCG/ACT inhaler, USE 2 INHALATIONS TWICE A DAY, Disp: 36 g, Rfl: 3   albuterol (VENTOLIN HFA) 108 (90 Base) MCG/ACT inhaler, USE 2 INHALATIONS EVERY 6 HOURS AS NEEDED FOR WHEEZING OR SHORTNESS OF BREATH, Disp: 8.5 g, Rfl: 10   azelastine (ASTELIN) 0.1 % nasal spray, Place 1-2 sprays into both nostrils 2 (two) times daily as needed for rhinitis (nasal  drainage). Use in each nostril as directed, Disp: 90 mL, Rfl: 1   Cetirizine HCl (ZYRTEC ALLERGY) 10 MG CAPS, Take 10 mg by mouth daily., Disp: , Rfl:    Cholecalciferol (VITAMIN D3) 125 MCG (5000 UT) TABS, Take 1 tablet by mouth daily. , Disp: , Rfl:    clindamycin-tretinoin (ZIANA) gel, Apply 1 application topically daily., Disp: , Rfl:    EPINEPHrine 0.3 mg/0.3 mL IJ SOAJ injection, See admin instructions. for allergic reaction, Disp: , Rfl:    fluticasone (FLONASE) 50 MCG/ACT nasal spray, Place 1 spray into both nostrils 2 (two) times daily as needed for allergies or rhinitis (nasal congestion)., Disp: 48 g, Rfl: 1   montelukast (SINGULAIR) 10 MG tablet, TAKE 1 TABLET AT BEDTIME, Disp: 90 tablet, Rfl: 3   nebivolol (BYSTOLIC) 5 MG tablet, Take 1 tablet (5 mg total) by mouth daily., Disp: 90 tablet, Rfl: 3   pantoprazole (PROTONIX) 40 MG tablet, TAKE 1 TABLET TWICE A DAY, Disp: 180 tablet, Rfl: 1   propranolol (INDERAL) 10 MG tablet, Take 1 tablet (10 mg total) by mouth 3 (three) times daily. Take only as needed for tachycardia, Disp: 60 tablet, Rfl: 1   ALPRAZolam (XANAX) 0.25 MG tablet, TAKE 1 TABLET BY MOUTH AS NEEDED FOR PANIC ATTACKS (Patient not taking: Reported on 04/03/2021), Disp: 30 tablet, Rfl: 5   sertraline (ZOLOFT) 100 MG tablet, Take 1 tablet (100 mg total) by mouth at bedtime. (Patient not taking: Reported on 04/03/2021), Disp: 90 tablet, Rfl: 1  No results found.  No images are attached to the encounter.   CMP Latest Ref Rng & Units 05/31/2020  Glucose 70 - 99 mg/dL 88  BUN 6 - 20 mg/dL 12  Creatinine 0.44 - 1.00 mg/dL 0.80  Sodium 135 - 145 mmol/L 140  Potassium 3.5 - 5.1 mmol/L 4.1  Chloride 98 - 111 mmol/L 106  CO2 22 - 32 mmol/L 27  Calcium 8.9 - 10.3 mg/dL 8.5(L)  Total Protein 6.5 - 8.1 g/dL 7.5  Total Bilirubin 0.3 - 1.2 mg/dL 0.6  Alkaline  Phos 38 - 126 U/L 56  AST 15 - 41 U/L 18  ALT 0 - 44 U/L 17   CBC Latest Ref Rng & Units 04/03/2021  WBC 4.0 - 10.5 K/uL  4.5  Hemoglobin 12.0 - 15.0 g/dL 14.1  Hematocrit 36.0 - 46.0 % 41.9  Platelets 150 - 400 K/uL 173     Observation/objective:appears in no acute distress over video visit today. Breathing is non labored  Assessment and plan: Patient is a 40 year old female with chronic mild to moderate isolated neutropenia here for routine follow-up  Autoimmune neutropenia: Patient has had an entire work-up for neutropenia including a bone marrow biopsy which did not show any discernible etiology.  Working diagnosis for her neutropenia is possibly autoimmune and could be connected to her underlying undifferentiated connective tissue disorder as well.  She was evaluated by Dr. Gaylyn Cheers at [facility] who recommended that she should be started on Plaquenil.  She has subsequently been seen by rheumatology and will also follow-up with them this month.  She has not been started on Plaquenil yet and her work-up is undergoing.  Overall her neutropenia waxes and wanes but has not caused any repeated infections.  She does not require any Neupogen at this time.  Follow-up instructions: CBC with differential in 3 in 6 months and I will see her in 6 months  I discussed the assessment and treatment plan with the patient. The patient was provided an opportunity to ask questions and all were answered. The patient agreed with the plan and demonstrated an understanding of the instructions.   The patient was advised to call back or seek an in-person evaluation if the symptoms worsen or if the condition fails to improve as anticipated.   Visit Diagnosis: 1. Other neutropenia ([place])     Dr. Randa Evens, MD, MPH [facility] at [facility] Tel- [phone] 04/03/2021 4:57 PM

## 2021-04-03 NOTE — Progress Notes (Signed)
Called pt and she currently going through divorce. She has had allergies and got sick. She nows feel tired.

## 2021-04-05 ENCOUNTER — Encounter: Payer: Self-pay | Admitting: Family Medicine

## 2021-04-06 MED ORDER — PREDNISONE 10 MG PO TABS: ORAL_TABLET | ORAL | 0 refills | Status: DC

## 2021-04-06 NOTE — Telephone Encounter (Signed)
PATIENT NEEDS  TO TAKE A COURSE OF PREDNISONE FOR WHEEZING , WHICH I SENT TO [facility].     SAYS SHE CANNOT GET THROUGH TO SCHEDULE APPT.    IF SHE CANNOT TOLERATE PREDNISONE, SHE NEEDS TO BE SEEN IN PERSON,

## 2021-04-09 NOTE — Telephone Encounter (Signed)
Patient states she started the Prednisone yesterday and feels a bit better. She would still like to be seen due to continued wheezing. Scheduled to be seen 04/13/21 with Dr Caryl Bis at [facility]

## 2021-04-09 NOTE — Telephone Encounter (Signed)
Left message to return call 

## 2021-04-09 NOTE — Telephone Encounter (Signed)
[phone] (Mobile)   Left message to return call.

## 2021-04-13 ENCOUNTER — Encounter: Payer: Self-pay | Admitting: Family Medicine

## 2021-04-13 ENCOUNTER — Other Ambulatory Visit: Payer: Self-pay

## 2021-04-13 ENCOUNTER — Ambulatory Visit: Payer: Commercial Managed Care - PPO | Admitting: Family Medicine

## 2021-04-13 DIAGNOSIS — J45901 Unspecified asthma with (acute) exacerbation: Secondary | ICD-10-CM | POA: Insufficient documentation

## 2021-04-13 DIAGNOSIS — J4541 Moderate persistent asthma with (acute) exacerbation: Secondary | ICD-10-CM | POA: Diagnosis not present

## 2021-04-13 NOTE — Assessment & Plan Note (Addendum)
The patient is having improving asthma exacerbation symptoms with tapering on her prednisone.  I have provided her alternative instructions given that she has only taken 10 mg the past 2 days of her prednisone.  She will take prednisone 40 mg today followed by 30 mg the next 3 days.  If not improved by Monday she will let us know.  If she develops worsening symptoms she will seek reevaluation.  If she has issues with SVT she will let us know right away.

## 2021-04-13 NOTE — Progress Notes (Signed)
Tommi Rumps, MD Phone: [phone]  Molly Stone is a 40 y.o. female who presents today for f/u.  Upper respiratory infection/asthma exacerbation: The patient was treated with a Z-Pak earlier this month and notes she did not feel much better after taking this.  She started wheezing several days after finishing the Z-Pak.  She has continued to have wheezing.  Notes her cough is improved and now she has just a mild tickle in her throat.  She does wake up with congestion in her throat in the morning.  No fevers.  She has been short of breath a couple of times but nothing significant.  She does have a history of asthma and is on Advair.  She has been using her albuterol frequently.  She was recently started on a prednisone taper by her PCP.  She reports the pharmacy told her to just take prednisone 1 tablet/day after the first day.  She took 60 mg the first day and has taken 10 mg the past 2 days.  She reports a history of SVT triggered by prednisone in the past.  Social History   Tobacco Use  Smoking Status Every Day   Packs/day: 0.50   Years: 10.00   Pack years: 5.00   Types: Cigarettes  Smokeless Tobacco Never    Current Outpatient Medications on File Prior to Visit  Medication Sig Dispense Refill   ADVAIR HFA 230-21 MCG/ACT inhaler USE 2 INHALATIONS TWICE A DAY 36 g 3   albuterol (VENTOLIN HFA) 108 (90 Base) MCG/ACT inhaler USE 2 INHALATIONS EVERY 6 HOURS AS NEEDED FOR WHEEZING OR SHORTNESS OF BREATH 8.5 g 10   ALPRAZolam (XANAX) 0.25 MG tablet TAKE 1 TABLET BY MOUTH AS NEEDED FOR PANIC ATTACKS 30 tablet 5   azelastine (ASTELIN) 0.1 % nasal spray Place 1-2 sprays into both nostrils 2 (two) times daily as needed for rhinitis (nasal drainage). Use in each nostril as directed 90 mL 1   Cetirizine HCl (ZYRTEC ALLERGY) 10 MG CAPS Take 10 mg by mouth daily.     Cholecalciferol (VITAMIN D3) 125 MCG (5000 UT) TABS Take 1 tablet by mouth daily.      clindamycin-tretinoin (ZIANA) gel  Apply 1 application topically daily.     EPINEPHrine 0.3 mg/0.3 mL IJ SOAJ injection See admin instructions. for allergic reaction     fluticasone (FLONASE) 50 MCG/ACT nasal spray Place 1 spray into both nostrils 2 (two) times daily as needed for allergies or rhinitis (nasal congestion). 48 g 1   montelukast (SINGULAIR) 10 MG tablet TAKE 1 TABLET AT BEDTIME 90 tablet 3   nebivolol (BYSTOLIC) 5 MG tablet Take 1 tablet (5 mg total) by mouth daily. 90 tablet 3   pantoprazole (PROTONIX) 40 MG tablet TAKE 1 TABLET TWICE A DAY 180 tablet 1   predniSONE (DELTASONE) 10 MG tablet 6 tablets on Day 1 , then reduce by 1 tablet daily until gone 21 tablet 0   propranolol (INDERAL) 10 MG tablet Take 1 tablet (10 mg total) by mouth 3 (three) times daily. Take only as needed for tachycardia 60 tablet 1   sertraline (ZOLOFT) 100 MG tablet Take 1 tablet (100 mg total) by mouth at bedtime. (Patient not taking: Reported on 04/13/2021) 90 tablet 1   No current facility-administered medications on file prior to visit.     ROS see history of present illness  Objective  Physical Exam Vitals:   04/13/21 1511  BP: 110/80  Pulse: 87  Temp: 98.6 F (37 C)  SpO2: 96%    BP Readings from Last 3 Encounters:  04/13/21 110/80  01/02/21 118/80  10/03/20 118/89   Wt Readings from Last 3 Encounters:  04/13/21 297 lb 6.4 oz (134.9 kg)  03/26/21 (!) 305 lb (138.3 kg)  01/29/21 284 lb (128.8 kg)    Physical Exam Constitutional:      General: She is not in acute distress.    Appearance: She is not diaphoretic.  Cardiovascular:     Rate and Rhythm: Normal rate and regular rhythm.     Heart sounds: Normal heart sounds.  Pulmonary:     Effort: Pulmonary effort is normal.     Breath sounds: Normal breath sounds.  Skin:    General: Skin is warm and dry.  Neurological:     Mental Status: She is alert.     Assessment/Plan: Please see individual problem list.  Problem List Items Addressed This Visit      Asthma exacerbation    The patient is having improving asthma exacerbation symptoms with tapering on her prednisone.  I have provided her alternative instructions given that she has only taken 10 mg the past 2 days of her prednisone.  She will take prednisone 40 mg today followed by 30 mg the next 3 days.  If not improved by Monday she will let us know.  If she develops worsening symptoms she will seek reevaluation.  If she has issues with SVT she will let us know right away.        Return if symptoms worsen or fail to improve.  This visit occurred during the SARS-CoV-2 public health emergency.  Safety protocols were in place, including screening questions prior to the visit, additional usage of staff PPE, and extensive cleaning of exam room while observing appropriate contact time as indicated for disinfecting solutions.    Tommi Rumps, MD [place]

## 2021-04-13 NOTE — Patient Instructions (Signed)
Nice to see you. Please take prednisone 40 mg today followed by 30 mg a day for the following 3 days.  If you feel as though your heart rate is going up please contact us.  If you are not improving in 1 day please let me know.

## 2021-04-26 ENCOUNTER — Other Ambulatory Visit: Payer: Self-pay | Admitting: Internal Medicine

## 2021-05-08 ENCOUNTER — Ambulatory Visit: Payer: Commercial Managed Care - PPO | Admitting: Allergy

## 2021-05-08 ENCOUNTER — Other Ambulatory Visit: Payer: Self-pay

## 2021-05-08 ENCOUNTER — Encounter: Payer: Self-pay | Admitting: Allergy

## 2021-05-08 VITALS — BP 124/78 | HR 111 | Temp 97.0°F | Resp 20

## 2021-05-08 DIAGNOSIS — Z8709 Personal history of other diseases of the respiratory system: Secondary | ICD-10-CM | POA: Diagnosis not present

## 2021-05-08 DIAGNOSIS — H1010 Acute atopic conjunctivitis, unspecified eye: Secondary | ICD-10-CM

## 2021-05-08 DIAGNOSIS — J302 Other seasonal allergic rhinitis: Secondary | ICD-10-CM

## 2021-05-08 DIAGNOSIS — J4540 Moderate persistent asthma, uncomplicated: Secondary | ICD-10-CM | POA: Diagnosis not present

## 2021-05-08 DIAGNOSIS — R12 Heartburn: Secondary | ICD-10-CM | POA: Diagnosis not present

## 2021-05-08 DIAGNOSIS — R21 Rash and other nonspecific skin eruption: Secondary | ICD-10-CM

## 2021-05-08 DIAGNOSIS — J3089 Other allergic rhinitis: Secondary | ICD-10-CM

## 2021-05-08 DIAGNOSIS — H1013 Acute atopic conjunctivitis, bilateral: Secondary | ICD-10-CM

## 2021-05-08 MED ORDER — SPIRIVA RESPIMAT 1.25 MCG/ACT IN AERS: 2.0000 | INHALATION_SPRAY | Freq: Every day | RESPIRATORY_TRACT | 5 refills | Status: DC

## 2021-05-08 MED ORDER — CARBINOXAMINE MALEATE 6 MG PO TABS: 1.0000 | ORAL_TABLET | Freq: Three times a day (TID) | ORAL | 11 refills | Status: DC | PRN

## 2021-05-08 NOTE — Progress Notes (Signed)
Follow Up Note  RE: Molly Stone MRN: [id number] DOB: Aug 16, 1981 Date of Office Visit: 05/08/2021  Referring provider: Crecencio Mc, MD Primary care provider: Crecencio Mc, MD  Chief Complaint: Asthma  History of Present Illness: I had the pleasure of seeing Molly Stone for a follow up visit at the [facility] on 05/08/2021. She is a 40 y.o. female, who is being followed for asthma, allergic rhinoconjunctivitis, history of frequent URIs, heartburn. Her previous allergy office visit was on 01/02/2021 with Dr. Maudie Mercury. Today is a regular follow up visit.  Immunization reaction Patient did well after the last booster. Going to [place] in December for 2 weeks.   Moderate persistent asthma Patient had a flare after cleaning her dad's house and being outdoors in the hot/humid weather. She had to use xanax as it was making her very anxious as well.  Uses albuterol about twice per week with good benefit.   Currently on Advair 226mcg 2 puffs twice a day, Singulair daily.   Currently on prednisone for the facial rash which is improving.   She also had another course  of prednisone and antibiotics for bronchitis last month.    Seasonal and perennial allergic rhinoconjunctivitis Currently on zyrtec 10mg  1-2 times a day, Flonase, azelastine daily, singulair daily.  Having increased PND.  Reflux  Patient has reflux and takes Protonix 40mg  BID.   Patient lives about 1 hour away.   Assessment and Plan: Molly Stone is a 40 y.o. female with: Moderate persistent asthma without complication Past history - Diagnosed with asthma over 20 years ago. Currently on Advair 254mcg 2 puffs BID, Singulair daily and using albuterol as needed. Albuterol was causing palpitations but xopenex nebulizer caused throat tightness. 2021 spirometry was normal with no improvement in FEV1 post bronchodilator treatment. Clinically feeling the same. Interestingly patient was coughing after the  treatment.  Interim history - flared in June with bronchitis and had 1 course of prednisone. Now on prednisone for rash.  Today's spirometry was normal.  ACT score 15.  Daily controller medication(s): Advair 244mcg 2 puffs twice a day with spacer and rinse mouth afterwards. START Spiriva 1.59mcg 2 puffs once a day. Sample given. Demonstrated proper use. Coupon given. Continue Singulair 10mg  daily.  May use albuterol rescue inhaler 2 puffs every 4 to 6 hours as needed for shortness of breath, chest tightness, coughing, and wheezing. May use albuterol rescue inhaler 2 puffs 5 to 15 minutes prior to strenuous physical activities. Monitor frequency of use.  Get spirometry at next visit.  Seasonal and perennial allergic rhinoconjunctivitis Past history - Perennial rhino conjunctivitis symptoms for 30+ years. 3-4 sinus infections per year. Prior blood work was positive to dust mites, cat, dog, grass, cockroach, tree.  Borderline positive to ragweed and weed. 2021 skin testing showed: Positive to grass, weed, ragweed, trees, mold, dust mites, cat, dog, cockroach. Negative control slightly positive as well.  Interim history - increased PND. Continue environmental control measures as below. Stop zyrtec. Start Ryvent (carbinoxamine 6mg ) every 8 hours as needed. Sample given. Use Flonase (fluticasone) nasal spray 1 spray per nostril twice a day as needed for nasal congestion.  Use azelastine nasal spray 1-2 sprays per nostril twice a day as needed for runny nose/drainage. Nasal saline spray (i.e., Simply Saline) or nasal saline lavage (i.e., NeilMed) is recommended as needed and prior to medicated nasal sprays. Continue Singulair (montelukast) 10mg  daily at night. Patient would be a good candidate for allergy immunotherapy however  she lives 1 hour away and has not found a local allergist yet.   Heartburn Continue Protonix 40mg  twice a day. Continue lifestyle and dietary modifications.  History of  frequent upper respiratory infection Past history - frequent URIs and had 6 antibiotics this year. Being followed by hematology for low WBC.  Interim history - 1 bronchitis in June.  Keep track of infections.  Rash and other nonspecific skin eruption Continue proper skin care.   Return in about 4 months (around 09/08/2021).  Meds ordered this encounter  Medications   Tiotropium Bromide Monohydrate (SPIRIVA RESPIMAT) 1.25 MCG/ACT AERS    Sig: Inhale 2 puffs into the lungs daily.    Dispense:  4 g    Refill:  5   Carbinoxamine Maleate (RYVENT) 6 MG TABS    Sig: Take 1 tablet by mouth 3 (three) times daily as needed (allergies).    Dispense:  120 tablet    Refill:  11    [id number]    Lab Orders  No laboratory test(s) ordered today    Diagnostics: Spirometry:  Tracings reviewed. Her effort: Good reproducible efforts. FVC: 3.92L FEV1: 3.43L, 97% predicted FEV1/FVC ratio: 88% Interpretation: Spirometry consistent with normal pattern.  Please see scanned spirometry results for details.  Medication List:  Current Outpatient Medications  Medication Sig Dispense Refill   ADVAIR HFA 230-21 MCG/ACT inhaler USE 2 INHALATIONS TWICE A DAY 36 g 3   albuterol (VENTOLIN HFA) 108 (90 Base) MCG/ACT inhaler USE 2 INHALATIONS EVERY 6 HOURS AS NEEDED FOR WHEEZING OR SHORTNESS OF BREATH 8.5 g 10   ALPRAZolam (XANAX) 0.25 MG tablet TAKE 1 TABLET BY MOUTH AS NEEDED FOR PANIC ATTACKS 30 tablet 5   azelastine (ASTELIN) 0.1 % nasal spray Place 1-2 sprays into both nostrils 2 (two) times daily as needed for rhinitis (nasal drainage). Use in each nostril as directed 90 mL 1   Carbinoxamine Maleate (RYVENT) 6 MG TABS Take 1 tablet by mouth 3 (three) times daily as needed (allergies). 120 tablet 11   Cholecalciferol (VITAMIN D3) 125 MCG (5000 UT) TABS Take 1 tablet by mouth daily.      EPINEPHrine 0.3 mg/0.3 mL IJ SOAJ injection See admin instructions. for allergic reaction     fluticasone (FLONASE)  50 MCG/ACT nasal spray Place 1 spray into both nostrils 2 (two) times daily as needed for allergies or rhinitis (nasal congestion). 48 g 1   hydrocortisone 2.5 % cream Apply topically.     metFORMIN (GLUCOPHAGE-XR) 500 MG 24 hr tablet Take by mouth.     methylPREDNISolone (MEDROL DOSEPAK) 4 MG TBPK tablet Take by mouth.     montelukast (SINGULAIR) 10 MG tablet TAKE 1 TABLET AT BEDTIME 90 tablet 3   nebivolol (BYSTOLIC) 5 MG tablet Take 1 tablet (5 mg total) by mouth daily. 90 tablet 3   pantoprazole (PROTONIX) 40 MG tablet TAKE 1 TABLET TWICE A DAY 180 tablet 1   Semaglutide,0.25 or 0.5MG /DOS, 2 MG/1.5ML SOPN Inject into the skin.     Tiotropium Bromide Monohydrate (SPIRIVA RESPIMAT) 1.25 MCG/ACT AERS Inhale 2 puffs into the lungs daily. 4 g 5   clindamycin-tretinoin (ZIANA) gel Apply 1 application topically daily. (Patient not taking: Reported on 05/08/2021)     propranolol (INDERAL) 10 MG tablet Take 1 tablet (10 mg total) by mouth 3 (three) times daily. Take only as needed for tachycardia (Patient not taking: Reported on 05/08/2021) 60 tablet 1   No current facility-administered medications for this visit.   Allergies: Allergies  Allergen Reactions   Buprenorphine Hcl Itching and Other (See Comments)    And shaking   Morphine And Related Itching and Other (See Comments)    And shaking   Prednisone Anxiety and Palpitations   Tramadol Hcl Other (See Comments)    anxious   I reviewed her past medical history, social history, family history, and environmental history and no significant changes have been reported from her previous visit.  Review of Systems  Constitutional:  Negative for appetite change, chills, fever and unexpected weight change.  HENT:  Positive for postnasal drip. Negative for congestion and rhinorrhea.   Eyes:  Negative for itching.  Respiratory:  Negative for cough, chest tightness, shortness of breath and wheezing.   Cardiovascular:  Negative for chest pain.   Gastrointestinal:  Negative for abdominal pain.  Genitourinary:  Negative for difficulty urinating.  Skin:  Positive for rash.  Allergic/Immunologic: Positive for environmental allergies.   Objective: BP 124/78   Pulse (!) 111   Temp (!) 97 F (36.1 C) (Temporal)   Resp 20   LMP 04/08/2021 (Exact Date)   SpO2 94%  There is no height or weight on file to calculate BMI. Physical Exam Vitals and nursing note reviewed.  Constitutional:      Appearance: Normal appearance. She is well-developed. She is obese.  HENT:     Head: Normocephalic and atraumatic.     Right Ear: Tympanic membrane and external ear normal.     Left Ear: Tympanic membrane and external ear normal.     Nose: Nose normal.     Mouth/Throat:     Mouth: Mucous membranes are moist.     Pharynx: Oropharynx is clear.  Eyes:     Conjunctiva/sclera: Conjunctivae normal.  Cardiovascular:     Rate and Rhythm: Normal rate and regular rhythm.     Heart sounds: Normal heart sounds. No murmur heard.   No friction rub. No gallop.  Pulmonary:     Effort: Pulmonary effort is normal.     Breath sounds: Normal breath sounds. No wheezing, rhonchi or rales.  Musculoskeletal:     Cervical back: Neck supple.  Skin:    General: Skin is warm.     Findings: Rash present.     Comments: Erythematous rash on forehead  Neurological:     Mental Status: She is alert and oriented to person, place, and time.  Psychiatric:        Behavior: Behavior normal.   Previous notes and tests were reviewed. The plan was reviewed with the patient/family, and all questions/concerned were addressed.  It was my pleasure to see Molly Stone today and participate in her care. Please feel free to contact me with any questions or concerns.  Sincerely,  Rexene Alberts, DO Allergy & Immunology  [facility] office: [place] office: [phone]

## 2021-05-08 NOTE — Assessment & Plan Note (Signed)
Past history - Diagnosed with asthma over 20 years ago. Currently on Advair 236mcg 2 puffs BID, Singulair daily and using albuterol as needed. Albuterol was causing palpitations but xopenex nebulizer caused throat tightness. 2021 spirometry was normal with no improvement in FEV1 post bronchodilator treatment. Clinically feeling the same. Interestingly patient was coughing after the treatment.  Interim history - flared in June with bronchitis and had 1 course of prednisone. Now on prednisone for rash.   Today's spirometry was normal.   ACT score 15.  . Daily controller medication(s): Advair 232mcg 2 puffs twice a day with spacer and rinse mouth afterwards. Marland Kitchen START Spiriva 1.47mcg 2 puffs once a day. Sample given. Demonstrated proper use. Coupon given. . Continue Singulair 10mg  daily.  . May use albuterol rescue inhaler 2 puffs every 4 to 6 hours as needed for shortness of breath, chest tightness, coughing, and wheezing. May use albuterol rescue inhaler 2 puffs 5 to 15 minutes prior to strenuous physical activities. Monitor frequency of use.  . Get spirometry at next visit.

## 2021-05-08 NOTE — Patient Instructions (Addendum)
Environmental allergies 2021 skin testing Positive to grass, weed, ragweed, trees, mold, dust mites, cat, dog, cockroach. Continue environmental control measures as below. Stop zyrtec. Start Ryvent (carbinoxamine 6mg ) every 8 hours as needed. Sample given. Use Flonase (fluticasone) nasal spray 1 spray per nostril twice a day as needed for nasal congestion.  Use azelastine nasal spray 1-2 sprays per nostril twice a day as needed for runny nose/drainage. Nasal saline spray (i.e., Simply Saline) or nasal saline lavage (i.e., NeilMed) is recommended as needed and prior to medicated nasal sprays. Continue Singulair (montelukast) 10mg  daily at night.  Asthma: Daily controller medication(s): Advair 258mcg 2 puffs twice a day with spacer and rinse mouth afterwards. START Spiriva 1.4mcg 2 puffs once a day. Sample given. Demonstrated proper use. Coupon given. Continue Singulair 10mg  daily.  May use albuterol rescue inhaler 2 puffs every 4 to 6 hours as needed for shortness of breath, chest tightness, coughing, and wheezing. May use albuterol rescue inhaler 2 puffs 5 to 15 minutes prior to strenuous physical activities. Monitor frequency of use.  Asthma control goals:  Full participation in all desired activities (may need albuterol before activity) Albuterol use two times or less a week on average (not counting use with activity) Cough interfering with sleep two times or less a month Oral steroids no more than once a year No hospitalizations  Infections:  Keep track of infections.  Reflux/coughing: Continue Protonix 40mg  twice a day. Continue lifestyle and dietary modifications.  Rash: Continue proper skin care.   Follow up in 4 months or sooner if needed - check on asthma and allergies. Either with [place] or with an allergist closer to your home.   Reducing Pollen Exposure Pollen seasons: trees (spring), grass (summer) and ragweed/weeds (fall). Keep windows closed in your home and car to  lower pollen exposure.  Install air conditioning in the bedroom and throughout the house if possible.  Avoid going out in dry windy days - especially early morning. Pollen counts are highest between 5 - 10 AM and on dry, hot and windy days.  Save outside activities for late afternoon or after a heavy rain, when pollen levels are lower.  Avoid mowing of grass if you have grass pollen allergy. Be aware that pollen can also be transported indoors on people and pets.  Dry your clothes in an automatic dryer rather than hanging them outside where they might collect pollen.  Rinse hair and eyes before bedtime. Mold Control Mold and fungi can grow on a variety of surfaces provided certain temperature and moisture conditions exist.  Outdoor molds grow on plants, decaying vegetation and soil. The major outdoor mold, Alternaria and Cladosporium, are found in very high numbers during hot and dry conditions. Generally, a late summer - fall peak is seen for common outdoor fungal spores. Rain will temporarily lower outdoor mold spore count, but counts rise rapidly when the rainy period ends. The most important indoor molds are Aspergillus and Penicillium. Dark, humid and poorly ventilated basements are ideal sites for mold growth. The next most common sites of mold growth are the bathroom and the kitchen. Outdoor (Seasonal) Mold Control Use air conditioning and keep windows closed. Avoid exposure to decaying vegetation. Avoid leaf raking. Avoid grain handling. Consider wearing a face mask if working in moldy areas.  Indoor (Perennial) Mold Control  Maintain humidity below 50%. Get rid of mold growth on hard surfaces with water, detergent and, if necessary, 5% bleach (do not mix with other cleaners). Then dry the area  completely. If mold covers an area more than 10 square feet, consider hiring an indoor environmental professional. For clothing, washing with soap and water is best. If moldy items cannot be  cleaned and dried, throw them away. Remove sources e.g. contaminated carpets. Repair and seal leaking roofs or pipes. Using dehumidifiers in damp basements may be helpful, but empty the water and clean units regularly to prevent mildew from forming. All rooms, especially basements, bathrooms and kitchens, require ventilation and cleaning to deter mold and mildew growth. Avoid carpeting on concrete or damp floors, and storing items in damp areas. Control of House Dust Mite Allergen Dust mite allergens are a common trigger of allergy and asthma symptoms. While they can be found throughout the house, these microscopic creatures thrive in warm, humid environments such as bedding, upholstered furniture and carpeting. Because so much time is spent in the bedroom, it is essential to reduce mite levels there.  Encase pillows, mattresses, and box springs in special allergen-proof fabric covers or airtight, zippered plastic covers.  Bedding should be washed weekly in hot water (130 F) and dried in a hot dryer. Allergen-proof covers are available for comforters and pillows that can't be regularly washed.  Wash the allergy-proof covers every few months. Minimize clutter in the bedroom. Keep pets out of the bedroom.  Keep humidity less than 50% by using a dehumidifier or air conditioning. You can buy a humidity measuring device called a hygrometer to monitor this.  If possible, replace carpets with hardwood, linoleum, or washable area rugs. If that's not possible, vacuum frequently with a vacuum that has a HEPA filter. Remove all upholstered furniture and non-washable window drapes from the bedroom. Remove all non-washable stuffed toys from the bedroom.  Wash stuffed toys weekly. Pet Allergen Avoidance: Contrary to popular opinion, there are no "hypoallergenic" breeds of dogs or cats. That is because people are not allergic to an animal's hair, but to an allergen found in the animal's saliva, dander (dead skin  flakes) or urine. Pet allergy symptoms typically occur within minutes. For some people, symptoms can build up and become most severe 8 to 12 hours after contact with the animal. People with severe allergies can experience reactions in public places if dander has been transported on the pet owners' clothing. Keeping an animal outdoors is only a partial solution, since homes with pets in the yard still have higher concentrations of animal allergens. Before getting a pet, ask your allergist to determine if you are allergic to animals. If your pet is already considered part of your family, try to minimize contact and keep the pet out of the bedroom and other rooms where you spend a great deal of time. As with dust mites, vacuum carpets often or replace carpet with a hardwood floor, tile or linoleum. High-efficiency particulate air (HEPA) cleaners can reduce allergen levels over time. While dander and saliva are the source of cat and dog allergens, urine is the source of allergens from rabbits, hamsters, mice and [place] pigs; so ask a non-allergic family member to clean the animal's cage. If you have a pet allergy, talk to your allergist about the potential for allergy immunotherapy (allergy shots). This strategy can often provide long-term relief. Cockroach Allergen Avoidance Cockroaches are often found in the homes of densely populated urban areas, schools or commercial buildings, but these creatures can lurk almost anywhere. This does not mean that you have a dirty house or living area. Block all areas where roaches can enter the home. This  includes crevices, wall cracks and windows.  Cockroaches need water to survive, so fix and seal all leaky faucets and pipes. Have an exterminator go through the house when your family and pets are gone to eliminate any remaining roaches. Keep food in lidded containers and put pet food dishes away after your pets are done eating. Vacuum and sweep the floor after meals,  and take out garbage and recyclables. Use lidded garbage containers in the kitchen. Wash dishes immediately after use and clean under stoves, refrigerators or toasters where crumbs can accumulate. Wipe off the stove and other kitchen surfaces and cupboards regularly.  Skin care recommendations  Bath time: Always use lukewarm water. AVOID very hot or cold water. Keep bathing time to 5-10 minutes. Do NOT use bubble bath. Use a mild soap and use just enough to wash the dirty areas. Do NOT scrub skin vigorously.  After bathing, pat dry your skin with a towel. Do NOT rub or scrub the skin.  Moisturizers and prescriptions:  ALWAYS apply moisturizers immediately after bathing (within 3 minutes). This helps to lock-in moisture. Use the moisturizer several times a day over the whole body. Good summer moisturizers include: Aveeno, CeraVe, Cetaphil. Good winter moisturizers include: Aquaphor, Vaseline, Cerave, Cetaphil, Eucerin, Vanicream. When using moisturizers along with medications, the moisturizer should be applied about one hour after applying the medication to prevent diluting effect of the medication or moisturize around where you applied the medications. When not using medications, the moisturizer can be continued twice daily as maintenance.  Laundry and clothing: Avoid laundry products with added color or perfumes. Use unscented hypo-allergenic laundry products such as Tide free, Cheer free & gentle, and All free and clear.  If the skin still seems dry or sensitive, you can try double-rinsing the clothes. Avoid tight or scratchy clothing such as wool. Do not use fabric softeners or dyer sheets.

## 2021-05-08 NOTE — Assessment & Plan Note (Signed)
Past history - frequent URIs and had 6 antibiotics this year. Being followed by hematology for low WBC.  Interim history - 1 bronchitis in June.  Marland Kitchen Keep track of infections.

## 2021-05-08 NOTE — Assessment & Plan Note (Signed)
   Continue Protonix 40mg  twice a day.  Continue lifestyle and dietary modifications.

## 2021-05-08 NOTE — Assessment & Plan Note (Signed)
   Continue proper skin care. 

## 2021-05-08 NOTE — Assessment & Plan Note (Signed)
Past history - Perennial rhino conjunctivitis symptoms for 30+ years. 3-4 sinus infections per year. Prior blood work was positive to dust mites, cat, dog, grass, cockroach, tree.  Borderline positive to ragweed and weed. 2021 skin testing showed: Positive to grass, weed, ragweed, trees, mold, dust mites, cat, dog, cockroach. Negative control slightly positive as well.  Interim history - increased PND.  Continue environmental control measures as below.  Stop zyrtec.  Start Ryvent (carbinoxamine 6mg ) every 8 hours as needed. Sample given.  Use Flonase (fluticasone) nasal spray 1 spray per nostril twice a day as needed for nasal congestion.   Use azelastine nasal spray 1-2 sprays per nostril twice a day as needed for runny nose/drainage.  Nasal saline spray (i.e., Simply Saline) or nasal saline lavage (i.e., NeilMed) is recommended as needed and prior to medicated nasal sprays.  Continue Singulair (montelukast) 10mg  daily at night.  Patient would be a good candidate for allergy immunotherapy however she lives 1 hour away and has not found a local allergist yet.

## 2021-05-28 ENCOUNTER — Telehealth: Payer: Self-pay | Admitting: Internal Medicine

## 2021-05-28 NOTE — Telephone Encounter (Signed)
Transferred to access nurse  Pt stated that she is having pain under her left breast, she is nauseous and is having constipation

## 2021-05-28 NOTE — Telephone Encounter (Signed)
Patient was instructed by access nurse to be evaluated within 24 hours. Also she was informed of home care instructions from access nurse.

## 2021-05-29 ENCOUNTER — Other Ambulatory Visit: Payer: Self-pay

## 2021-06-02 ENCOUNTER — Other Ambulatory Visit: Payer: Self-pay

## 2021-06-02 ENCOUNTER — Encounter: Payer: Self-pay | Admitting: Emergency Medicine

## 2021-06-02 ENCOUNTER — Emergency Department: Payer: Commercial Managed Care - PPO

## 2021-06-02 ENCOUNTER — Emergency Department
Admission: EM | Admit: 2021-06-02 | Discharge: 2021-06-02 | Disposition: A | Discharge status: Home / Self Care | Payer: Commercial Managed Care - PPO | Attending: Emergency Medicine | Admitting: Emergency Medicine

## 2021-06-02 DIAGNOSIS — R059 Cough, unspecified: Secondary | ICD-10-CM | POA: Diagnosis present

## 2021-06-02 DIAGNOSIS — J4540 Moderate persistent asthma, uncomplicated: Secondary | ICD-10-CM | POA: Insufficient documentation

## 2021-06-02 DIAGNOSIS — Z7951 Long term (current) use of inhaled steroids: Secondary | ICD-10-CM | POA: Diagnosis not present

## 2021-06-02 DIAGNOSIS — F17210 Nicotine dependence, cigarettes, uncomplicated: Secondary | ICD-10-CM | POA: Insufficient documentation

## 2021-06-02 DIAGNOSIS — I10 Essential (primary) hypertension: Secondary | ICD-10-CM | POA: Insufficient documentation

## 2021-06-02 DIAGNOSIS — Z20822 Contact with and (suspected) exposure to covid-19: Secondary | ICD-10-CM | POA: Insufficient documentation

## 2021-06-02 DIAGNOSIS — R079 Chest pain, unspecified: Secondary | ICD-10-CM

## 2021-06-02 DIAGNOSIS — R000 Tachycardia, unspecified: Secondary | ICD-10-CM | POA: Diagnosis not present

## 2021-06-02 DIAGNOSIS — J40 Bronchitis, not specified as acute or chronic: Secondary | ICD-10-CM | POA: Insufficient documentation

## 2021-06-02 DIAGNOSIS — Z79899 Other long term (current) drug therapy: Secondary | ICD-10-CM | POA: Insufficient documentation

## 2021-06-02 LAB — CBC WITH DIFFERENTIAL/PLATELET
Abs Immature Granulocytes: 0 10*3/uL (ref 0.00–0.07)
Basophils Absolute: 0 10*3/uL (ref 0.0–0.1)
Basophils Relative: 1 %
Eosinophils Absolute: 0.3 10*3/uL (ref 0.0–0.5)
Eosinophils Relative: 7 %
HCT: 41.1 % (ref 36.0–46.0)
Hemoglobin: 14.1 g/dL (ref 12.0–15.0)
Immature Granulocytes: 0 %
Lymphocytes Relative: 41 %
Lymphs Abs: 1.4 10*3/uL (ref 0.7–4.0)
MCH: 31.3 pg (ref 26.0–34.0)
MCHC: 34.3 g/dL (ref 30.0–36.0)
MCV: 91.1 fL (ref 80.0–100.0)
Monocytes Absolute: 0.5 10*3/uL (ref 0.1–1.0)
Monocytes Relative: 16 %
Neutro Abs: 1.2 10*3/uL — ABNORMAL LOW (ref 1.7–7.7)
Neutrophils Relative %: 35 %
Platelets: 182 10*3/uL (ref 150–400)
RBC: 4.51 MIL/uL (ref 3.87–5.11)
RDW: 12.7 % (ref 11.5–15.5)
WBC: 3.4 10*3/uL — ABNORMAL LOW (ref 4.0–10.5)
nRBC: 0 % (ref 0.0–0.2)

## 2021-06-02 LAB — BASIC METABOLIC PANEL
Anion gap: 6 (ref 5–15)
BUN: 13 mg/dL (ref 6–20)
CO2: 25 mmol/L (ref 22–32)
Calcium: 8.7 mg/dL — ABNORMAL LOW (ref 8.9–10.3)
Chloride: 106 mmol/L (ref 98–111)
Creatinine, Ser: 0.74 mg/dL (ref 0.44–1.00)
GFR, Estimated: >60 mL/min (ref >60)
Glucose, Bld: 103 mg/dL — ABNORMAL HIGH (ref 70–99)
Potassium: 3.7 mmol/L (ref 3.5–5.1)
Sodium: 137 mmol/L (ref 135–145)

## 2021-06-02 LAB — RESP PANEL BY RT-PCR (FLU A&B, COVID) ARPGX2
Influenza A by PCR: NEGATIVE
Influenza B by PCR: NEGATIVE
SARS Coronavirus 2 by RT PCR: NEGATIVE

## 2021-06-02 LAB — D-DIMER, QUANTITATIVE: D-Dimer, Quant: <0.27 ug/mL-FEU (ref 0.00–0.50)

## 2021-06-02 LAB — TROPONIN I (HIGH SENSITIVITY): Troponin I (High Sensitivity): <2 ng/L (ref <18)

## 2021-06-02 LAB — HCG, QUANTITATIVE, PREGNANCY: hCG, Beta Chain, Quant, S: <1 m[IU]/mL (ref <5)

## 2021-06-02 MED ORDER — KETOROLAC TROMETHAMINE 30 MG/ML IJ SOLN
15.0000 mg | Freq: Once | INTRAMUSCULAR | Status: AC
  Administered 2021-06-02: 15 mg via INTRAVENOUS
  Filled 2021-06-02: qty 1

## 2021-06-02 NOTE — ED Triage Notes (Signed)
Pt to ED via POV with c/o L chest pain. Pt states pain started to medial side of L breast and has progressively spread. Pt states pain worse with cough/deep breathing.

## 2021-06-02 NOTE — ED Provider Notes (Signed)
[facility] Emergency Department Provider Note  ____________________________________________   Event Date/Time   First MD Initiated Contact with Patient 06/02/21 2101     (approximate)  I have reviewed the triage vital signs and the nursing notes.   HISTORY  Chief Complaint Chest Pain   HPI Molly Stone is a 40 y.o. female with a past medical history of anemia, anxiety, asthma, DDD, hiatal hernia and tobacco abuse who presents for assessment approximately 1 week of some left-sided chest pain associate with cough and shortness of breath.  She states it feels like it got worse over the last 24 hours and it will radiate more into her back.  No new vomiting, diarrhea, dysuria, Donnell pain, right-sided back pain, headache, earache, sore throat or any other clear associated sick symptoms.  She is not on blood thinners.  She is never had a blood clot before.  No other acute concerns at this time.         Past Medical History:  Diagnosis Date   Allergy    Anemia    Anxiety    Asthma    Chlamydia    h/o   Concussion without loss of consciousness 08/04/2016   DDD (degenerative disc disease), cervical 04/07/2019   Disorder of vocal cord    spasmodic dysphonia   Dysmenorrhea    Dyspareunia, female    Endometriosis    Family history of endometriosis    GERD (gastroesophageal reflux disease)    Headache    migraine   Heavy periods    Hiatal hernia    History of nephrolithiasis    Hypertension    Jaundice, physiologic, newborn    Lupus ([place])    Obesity (BMI 30-39.9)    Sleep apnea    Tobacco user    Vaginal Pap smear, abnormal     Patient Active Problem List   Diagnosis Date Noted   Asthma exacerbation 04/13/2021   Cough 03/26/2021   Immunization reaction 01/02/2021   Seasonal and perennial allergic rhinoconjunctivitis 01/02/2021   SLE (systemic lupus erythematosus related syndrome) ([place]) 11/09/2020   Drug reaction 09/06/2020   History of  frequent upper respiratory infection 09/06/2020   History of atopic dermatitis 09/06/2020   Moderate persistent asthma without complication 60/73/7106   Rash and other nonspecific skin eruption 09/06/2020   Heartburn 09/06/2020   Adverse food reaction 09/06/2020   Non-cardiac chest pain 03/30/2020   B12 deficiency 03/17/2020   Vasomotor flushing 02/07/2020   Neutropenia ([place]) 07/12/2019   DDD (degenerative disc disease), cervical 04/07/2019   Hyperlipidemia 04/07/2019   Abnormal MRI, cervical spine 04/07/2019   Right calf pain 03/24/2019   Paroxysmal tachycardia (HCC) 01/28/2019   Chronic bilateral thoracic back pain 03/07/2017   Spinal stenosis 03/05/2017   OSA (obstructive sleep apnea) 07/24/2016   Generalized anxiety disorder 10/25/2015   Family history of endometriosis 10/12/2015   Dysmenorrhea 10/12/2015   H/O renal calculi 08/15/2015   Morbid obesity ([place]) 08/15/2015   Encounter for preventive health examination 06/16/2014   Sciatica 04/01/2014   Other allergic rhinitis 04/12/2012   Vitamin D deficiency 04/12/2012   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   GERD (gastroesophageal reflux disease)     Past Surgical History:  Procedure Laterality Date   BONE MARROW BIOPSY     DILATATION & CURETTAGE/HYSTEROSCOPY WITH MYOSURE N/A 08/27/2018   Procedure: DILATATION & CURETTAGE/HYSTEROSCOPY WITH MINERVA;  Surgeon: Gae Dry, MD;  Location: [facility] ORS;  Service: Gynecology;  Laterality: N/A;   DILATION AND CURETTAGE  OF UTERUS     ESOPHAGOGASTRODUODENOSCOPY (EGD) WITH PROPOFOL N/A 08/07/2018   Procedure: ESOPHAGOGASTRODUODENOSCOPY (EGD) WITH PROPOFOL;  Surgeon: Jonathon Bellows, MD;  Location: [facility] ENDOSCOPY;  Service: Gastroenterology;  Laterality: N/A;   laparoscopy     ROOT CANAL     WISDOM TOOTH EXTRACTION      Prior to Admission medications   Medication Sig Start Date End Date Taking? Authorizing Provider  ADVAIR HFA 230-21 MCG/ACT inhaler USE 2 INHALATIONS TWICE A DAY 04/26/21    Crecencio Mc, MD  albuterol (VENTOLIN HFA) 108 (90 Base) MCG/ACT inhaler USE 2 INHALATIONS EVERY 6 HOURS AS NEEDED FOR WHEEZING OR SHORTNESS OF BREATH 09/19/20   Crecencio Mc, MD  ALPRAZolam Duanne Moron) 0.25 MG tablet TAKE 1 TABLET BY MOUTH AS NEEDED FOR PANIC ATTACKS 01/15/21   Crecencio Mc, MD  azelastine (ASTELIN) 0.1 % nasal spray Place 1-2 sprays into both nostrils 2 (two) times daily as needed for rhinitis (nasal drainage). Use in each nostril as directed 09/19/20   Garnet Sierras, DO  Carbinoxamine Maleate (RYVENT) 6 MG TABS Take 1 tablet by mouth 3 (three) times daily as needed (allergies). 05/08/21   Garnet Sierras, DO  Cholecalciferol (VITAMIN D3) 125 MCG (5000 UT) TABS Take 1 tablet by mouth daily.     [provider]  clindamycin-tretinoin Pershing Proud) gel Apply 1 application topically daily. Patient not taking: Reported on 05/08/2021 08/30/20   [provider]  EPINEPHrine 0.3 mg/0.3 mL IJ SOAJ injection See admin instructions. for allergic reaction 02/17/20   [provider]  fluticasone (FLONASE) 50 MCG/ACT nasal spray Place 1 spray into both nostrils 2 (two) times daily as needed for allergies or rhinitis (nasal congestion). 11/08/20   Crecencio Mc, MD  hydrocortisone 2.5 % cream Apply topically. 05/07/21   [provider]  metFORMIN (GLUCOPHAGE-XR) 500 MG 24 hr tablet Take by mouth. 04/30/21   [provider]  methylPREDNISolone (MEDROL DOSEPAK) 4 MG TBPK tablet Take by mouth. 05/07/21   [provider]  montelukast (SINGULAIR) 10 MG tablet TAKE 1 TABLET AT BEDTIME 11/13/20   Crecencio Mc, MD  nebivolol (BYSTOLIC) 5 MG tablet Take 1 tablet (5 mg total) by mouth daily. 11/08/20   Minna Merritts, MD  pantoprazole (PROTONIX) 40 MG tablet TAKE 1 TABLET TWICE A DAY 09/07/20   Jonathon Bellows, MD  propranolol (INDERAL) 10 MG tablet Take 1 tablet (10 mg total) by mouth 3 (three) times daily. Take only as needed for tachycardia Patient not taking:  Reported on 05/08/2021 10/02/20   Minna Merritts, MD  Semaglutide,0.25 or 0.5MG/DOS, 2 MG/1.5ML SOPN Inject into the skin. 04/30/21 06/25/21  [provider]  Tiotropium Bromide Monohydrate (SPIRIVA RESPIMAT) 1.25 MCG/ACT AERS Inhale 2 puffs into the lungs daily. 05/08/21   Garnet Sierras, DO    Allergies Buprenorphine hcl, Morphine and related, Prednisone, and Tramadol hcl  Family History  Problem Relation Age of Onset   Endometriosis Mother    Allergic rhinitis Mother    Cancer Father        benign liver CA   Stroke Father        recurrent blood clots on the brain    Lupus Father    Cancer Maternal Grandmother 69       ovarian Ca, still surviving    Osteoporosis Maternal Grandmother    Ovarian cancer Maternal Grandmother    Heart disease Maternal Grandfather    Cancer Maternal Grandfather 50  colon CA. metastatic   Diabetes Maternal Uncle    Heart disease Paternal Grandmother    CAD Brother 49       early at age 68   Immunodeficiency Brother    Breast cancer Cousin    Colon cancer Neg Hx    Kidney cancer Neg Hx    Bladder Cancer Neg Hx    Angioedema Neg Hx    Atopy Neg Hx    Asthma Neg Hx    Eczema Neg Hx    Urticaria Neg Hx     Social History Social History   Tobacco Use   Smoking status: Every Day    Packs/day: 0.50    Years: 10.00    Pack years: 5.00    Types: Cigarettes   Smokeless tobacco: Never  Vaping Use   Vaping Use: Former  Substance Use Topics   Alcohol use: Yes    Alcohol/week: 0.0 standard drinks    Comment: rarely   Drug use: No    Review of Systems  Review of Systems  Constitutional:  Negative for chills and fever.  HENT:  Negative for sore throat.   Eyes:  Negative for pain.  Respiratory:  Positive for cough and shortness of breath. Negative for stridor.   Cardiovascular:  Positive for chest pain.  Gastrointestinal:  Negative for vomiting.  Genitourinary:  Negative for dysuria.  Musculoskeletal:  Negative for myalgias.   Skin:  Negative for rash.  Neurological:  Negative for seizures, loss of consciousness and headaches.  Psychiatric/Behavioral:  Negative for suicidal ideas.   All other systems reviewed and are negative.    ____________________________________________   PHYSICAL EXAM:  VITAL SIGNS: ED Triage Vitals  Enc Vitals Group     BP 06/02/21 2059 133/89     Pulse Rate 06/02/21 2059 (!) 102     Resp 06/02/21 2059 (!) 24     Temp 06/02/21 2059 97.7 F (36.5 C)     Temp Source 06/02/21 2059 Oral     SpO2 06/02/21 2059 97 %     Weight 06/02/21 2057 292 lb (132.5 kg)     Height 06/02/21 2057 5' 9" (1.753 m)     Head Circumference --      Peak Flow --      Pain Score 06/02/21 2057 9     Pain Loc --      Pain Edu? --      Excl. in [place]? --    Vitals:   06/02/21 2059 06/02/21 2145  BP: 133/89   Pulse: (!) 102   Resp: (!) 24 17  Temp: 97.7 F (36.5 C)   SpO2: 97%    Physical Exam Vitals and nursing note reviewed.  Constitutional:      General: She is not in acute distress.    Appearance: She is well-developed. She is obese.  HENT:     Head: Normocephalic and atraumatic.     Right Ear: External ear normal.     Left Ear: External ear normal.     Nose: Nose normal.  Eyes:     Conjunctiva/sclera: Conjunctivae normal.  Cardiovascular:     Rate and Rhythm: Regular rhythm. Tachycardia present.     Pulses: Normal pulses.     Heart sounds: No murmur heard. Pulmonary:     Effort: Pulmonary effort is normal. Tachypnea present. No respiratory distress.     Breath sounds: Normal breath sounds.  Abdominal:     Palpations: Abdomen is soft.     Tenderness:  There is no abdominal tenderness.  Musculoskeletal:     Cervical back: Neck supple.  Skin:    General: Skin is warm and dry.     Capillary Refill: Capillary refill takes less than 2 seconds.  Neurological:     Mental Status: She is alert and oriented to person, place, and time.  Psychiatric:        Mood and Affect: Mood normal.      ____________________________________________   LABS (all labs ordered are listed, but only abnormal results are displayed)  Labs Reviewed  CBC WITH DIFFERENTIAL/PLATELET - Abnormal; Notable for the following components:      Result Value   WBC 3.4 (*)    Neutro Abs 1.2 (*)    All other components within normal limits  BASIC METABOLIC PANEL - Abnormal; Notable for the following components:   Glucose, Bld 103 (*)    Calcium 8.7 (*)    All other components within normal limits  RESP PANEL BY RT-PCR (FLU A&B, COVID) ARPGX2  HCG, QUANTITATIVE, PREGNANCY  D-DIMER, QUANTITATIVE  TROPONIN I (HIGH SENSITIVITY)  TROPONIN I (HIGH SENSITIVITY)   ____________________________________________  EKG  Sinus rhythm with ventricular rate of 95, normal axis, unremarkable intervals with T wave inversion in lead III and aVF without other evidence of acute ischemia or significant arrhythmia. ____________________________________________  RADIOLOGY  ED MD interpretation: Chest x-ray shows no focal consolidation, overt edema, pneumothorax, effusion or other clear acute process.  Chronic bronchial thickening noted.  Official radiology report(s): DG Chest 2 View  Result Date: 06/02/2021 CLINICAL DATA:  Chest pain. EXAM: CHEST - 2 VIEW COMPARISON:  01/02/2019 FINDINGS: The cardiomediastinal contours are normal. Chronic bronchial thickening is stable. Pulmonary vasculature is normal. No consolidation, pleural effusion, or pneumothorax. Mild endplate spurring in the midthoracic spine. No acute osseous abnormalities are seen. IMPRESSION: Chronic bronchial thickening without acute abnormality. Electronically Signed   By: Keith Rake M.D.   On: 06/02/2021 21:21    ____________________________________________   PROCEDURES  Procedure(s) performed (including Critical Care):  Procedures   ____________________________________________   INITIAL IMPRESSION / ASSESSMENT AND PLAN / ED COURSE       Patient presents with above-stated history exam for assessment of some left-sided chest pain associate with cough and shortness of breath described above.  On arrival she is slight tachypneic at 24 and slight tachycardic at 102 with otherwise stable vital signs on room air.  Her lungs are clear bilaterally and her abdomen is soft nontender throughout.  Differential includes ACS, pericarditis, myocarditis, PE, pneumonia, bronchitis, symptomatic pleural effusion and costochondritis.  ECG has some nonspecific findings but given undetectable troponin I have a low suspicion for ACS or myocarditis at this time.  Low suspicion for PE as D-dimer is undetectable.  Chest x-ray shows no evidence of focal consolidation suggestive of bacterial pneumonia or other clear process but finding consistent with possible bronchitis.  BC shows WBC count of 3.4 without evidence of acute anemia or other significant derangements.  Pregnancy test is negative.  BMP shows no significant electrolyte or metabolic derangements.  COVID influenza PCR is negative.  Suspect likely viral bronchitis.  Given stable vitals otherwise reassuring exam work-up I think patient stable for discharge with close outpatient follow-up.  Counseled on tobacco cessation.  Discharged stable condition.  Strict return precautions advised and discussed.       ____________________________________________   FINAL CLINICAL IMPRESSION(S) / ED DIAGNOSES  Final diagnoses:  Chest pain, unspecified type  Bronchitis    Medications  ketorolac (TORADOL)  30 MG/ML injection 15 mg (has no administration in time range)     ED Discharge Orders     None        Note:  This document was prepared using Dragon voice recognition software and may include unintentional dictation errors.    Lucrezia Starch, MD 06/02/21 2250

## 2021-06-14 ENCOUNTER — Encounter: Payer: Self-pay | Admitting: Family Medicine

## 2021-06-14 ENCOUNTER — Ambulatory Visit: Payer: Commercial Managed Care - PPO | Admitting: Family Medicine

## 2021-06-14 ENCOUNTER — Other Ambulatory Visit: Payer: Self-pay

## 2021-06-14 DIAGNOSIS — F320 Major depressive disorder, single episode, mild: Secondary | ICD-10-CM

## 2021-06-14 DIAGNOSIS — J40 Bronchitis, not specified as acute or chronic: Secondary | ICD-10-CM | POA: Diagnosis not present

## 2021-06-14 MED ORDER — PREDNISONE 20 MG PO TABS: 40.0000 mg | ORAL_TABLET | Freq: Every day | ORAL | 0 refills | Status: DC

## 2021-06-14 NOTE — Assessment & Plan Note (Addendum)
Mild in nature.  This has been improving.  She declines any treatment for this.  She will monitor at this time.

## 2021-06-14 NOTE — Progress Notes (Signed)
Tommi Rumps, MD Phone: [phone]  Molly Stone is a 40 y.o. female who presents today for same-day visit.  Bronchitis: Patient was evaluated in the emergency department for shortness of breath with some chest discomfort.  Her work-up was unremarkable for a cause and it was felt that she had bronchitis.  She notes she has progressively improved.  The chest discomfort resolved.  She has no shortness of breath though does note some wheezing and continues to have some cough that is productive.  She continues on Advair, Spiriva, and as needed albuterol.  She is also using her nasal sprays and allergy medicines.  She does note some postnasal drip.  Depression: Patient notes this seems to be related to her getting a divorce.  Notes it is progressively getting better.  She has no SI or HI.  She does not want medication.  Social History   Tobacco Use  Smoking Status Every Day   Packs/day: 0.50   Years: 10.00   Pack years: 5.00   Types: Cigarettes  Smokeless Tobacco Never    Current Outpatient Medications on File Prior to Visit  Medication Sig Dispense Refill   ADVAIR HFA 230-21 MCG/ACT inhaler USE 2 INHALATIONS TWICE A DAY 36 g 3   albuterol (VENTOLIN HFA) 108 (90 Base) MCG/ACT inhaler USE 2 INHALATIONS EVERY 6 HOURS AS NEEDED FOR WHEEZING OR SHORTNESS OF BREATH 8.5 g 10   ALPRAZolam (XANAX) 0.25 MG tablet TAKE 1 TABLET BY MOUTH AS NEEDED FOR PANIC ATTACKS 30 tablet 5   azelastine (ASTELIN) 0.1 % nasal spray Place 1-2 sprays into both nostrils 2 (two) times daily as needed for rhinitis (nasal drainage). Use in each nostril as directed 90 mL 1   Carbinoxamine Maleate (RYVENT) 6 MG TABS Take 1 tablet by mouth 3 (three) times daily as needed (allergies). 120 tablet 11   Cholecalciferol (VITAMIN D3) 125 MCG (5000 UT) TABS Take 1 tablet by mouth daily.      clindamycin-tretinoin (ZIANA) gel Apply 1 application topically daily.     EPINEPHrine 0.3 mg/0.3 mL IJ SOAJ injection See admin  instructions. for allergic reaction     fluticasone (FLONASE) 50 MCG/ACT nasal spray Place 1 spray into both nostrils 2 (two) times daily as needed for allergies or rhinitis (nasal congestion). 48 g 1   hydrocortisone 2.5 % cream Apply topically.     metFORMIN (GLUCOPHAGE-XR) 500 MG 24 hr tablet Take by mouth.     montelukast (SINGULAIR) 10 MG tablet TAKE 1 TABLET AT BEDTIME 90 tablet 3   nebivolol (BYSTOLIC) 5 MG tablet Take 1 tablet (5 mg total) by mouth daily. 90 tablet 3   pantoprazole (PROTONIX) 40 MG tablet TAKE 1 TABLET TWICE A DAY 180 tablet 1   propranolol (INDERAL) 10 MG tablet Take 1 tablet (10 mg total) by mouth 3 (three) times daily. Take only as needed for tachycardia 60 tablet 1   Tiotropium Bromide Monohydrate (SPIRIVA RESPIMAT) 1.25 MCG/ACT AERS Inhale 2 puffs into the lungs daily. 4 g 5   methylPREDNISolone (MEDROL DOSEPAK) 4 MG TBPK tablet Take by mouth. (Patient not taking: Reported on 06/14/2021)     Semaglutide,0.25 or 0.'5MG'$ /DOS, 2 MG/1.5ML SOPN Inject into the skin. (Patient not taking: Reported on 06/14/2021)     No current facility-administered medications on file prior to visit.     ROS see history of present illness  Objective  Physical Exam Vitals:   06/14/21 1603  BP: 140/80  Pulse: 98  Temp: 98.7 F (37.1 C)  SpO2: 96%    BP Readings from Last 3 Encounters:  06/14/21 140/80  06/02/21 128/79  05/08/21 124/78   Wt Readings from Last 3 Encounters:  06/14/21 284 lb 6.4 oz (129 kg)  06/02/21 292 lb (132.5 kg)  04/13/21 297 lb 6.4 oz (134.9 kg)    Physical Exam Constitutional:      General: She is not in acute distress.    Appearance: She is not diaphoretic.  Cardiovascular:     Rate and Rhythm: Normal rate and regular rhythm.     Heart sounds: Normal heart sounds.  Pulmonary:     Effort: Pulmonary effort is normal.     Breath sounds: Normal breath sounds.  Skin:    General: Skin is warm and dry.  Neurological:     Mental Status: She is  alert.     Assessment/Plan: Please see individual problem list.  Problem List Items Addressed This Visit     Bronchitis    The patient has been progressively improving though does still seem to have some cough with some wheezing which could be a component of reactive airway disease from her asthma.  We will treat with a steroid burst with prednisone 40 mg once daily for 5 days.  She notes she has received prednisone in the recent past without any side effects.  If this is not beneficial for her or if her symptoms persist she will contact us for further management and evaluation.  She will let us know if she develops sleep issues, agitation, or excessive hunger related to the prednisone.      Relevant Medications   predniSONE (DELTASONE) 20 MG tablet   Depression, major, single episode, mild (HCC)    Mild in nature.  This has been improving.  She declines any treatment for this.  She will monitor at this time.       Return if symptoms worsen or fail to improve.  This visit occurred during the SARS-CoV-2 public health emergency.  Safety protocols were in place, including screening questions prior to the visit, additional usage of staff PPE, and extensive cleaning of exam room while observing appropriate contact time as indicated for disinfecting solutions.    Tommi Rumps, MD [place]

## 2021-06-14 NOTE — Assessment & Plan Note (Addendum)
The patient has been progressively improving though does still seem to have some cough with some wheezing which could be a component of reactive airway disease from her asthma.  We will treat with a steroid burst with prednisone 40 mg once daily for 5 days.  She notes she has received prednisone in the recent past without any side effects.  If this is not beneficial for her or if her symptoms persist she will contact us for further management and evaluation.  She will let us know if she develops sleep issues, agitation, or excessive hunger related to the prednisone.

## 2021-06-14 NOTE — Patient Instructions (Signed)
Nice to see you. We are going to treat you with a course of prednisone to see if that will knock out the lingering issues.  If your symptoms do not resolve with this please let us know.  If you have any worsening symptoms or you develop agitation, sleep issues, or excessive appetite please let us know as well.

## 2021-06-20 ENCOUNTER — Ambulatory Visit: Payer: Commercial Managed Care - PPO | Admitting: Nurse Practitioner

## 2021-06-20 ENCOUNTER — Other Ambulatory Visit: Payer: Self-pay

## 2021-06-20 ENCOUNTER — Encounter: Payer: Self-pay | Admitting: Nurse Practitioner

## 2021-06-20 VITALS — BP 98/78 | HR 86 | Temp 97.9°F | Ht 69.0 in | Wt 282.0 lb

## 2021-06-20 DIAGNOSIS — S0502XA Injury of conjunctiva and corneal abrasion without foreign body, left eye, initial encounter: Secondary | ICD-10-CM

## 2021-06-20 MED ORDER — ERYTHROMYCIN 5 MG/GM OP OINT
1.0000 | TOPICAL_OINTMENT | Freq: Four times a day (QID) | OPHTHALMIC | 0 refills | Status: AC
Start: 2021-06-20 — End: 2021-06-25

## 2021-06-20 NOTE — Patient Instructions (Signed)
Pleasure seeing you today. Follow up if symptoms fail to improve or worsen.
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
## 2021-06-20 NOTE — Assessment & Plan Note (Signed)
Corneal abrasion due injury to the eye. Vision intact. EOM intact. Pupils constrict bilaterally and equally. Start erythromycin eye ointment. Discussed signs and symptoms as when to be seen urgently or emergently. Follow up if symptoms do not improve.

## 2021-06-26 ENCOUNTER — Telehealth: Payer: Self-pay

## 2021-06-26 NOTE — Telephone Encounter (Signed)
Pt is going on vacation 14th and wanted to start a BC to stop her period since the ablation did not work. She's aware RPH will be back on the 12th. If you can not prescribe BC she will have to wait on him.

## 2021-06-27 NOTE — Telephone Encounter (Signed)
How long are periods lasting? Per Dr. Kenton Kingfisher' note from 11/21, lasting 1 day. Starting hormones mid cycle may not do anythign since have to do progesterone only due to HTN.

## 2021-06-28 NOTE — Telephone Encounter (Signed)
Left message for pt to return call.

## 2021-06-29 ENCOUNTER — Telehealth: Payer: Self-pay | Admitting: Gastroenterology

## 2021-06-29 NOTE — Telephone Encounter (Signed)
Caller is a past patient of Dr. Vicente Males she is requesting a call back because she has questions about a procedure

## 2021-07-04 ENCOUNTER — Other Ambulatory Visit: Payer: Commercial Managed Care - PPO

## 2021-07-05 NOTE — Telephone Encounter (Signed)
Patient was called on 07/03/2021 but I forgot to document in her chart. Questions were answered and she has an appointment scheduled to come in to see Dr. Vicente Males on 07/19/2021.

## 2021-07-12 ENCOUNTER — Inpatient Hospital Stay: Payer: Commercial Managed Care - PPO | Attending: Oncology

## 2021-07-16 DIAGNOSIS — D72819 Decreased white blood cell count, unspecified: Secondary | ICD-10-CM | POA: Insufficient documentation

## 2021-07-16 DIAGNOSIS — M2550 Pain in unspecified joint: Secondary | ICD-10-CM | POA: Insufficient documentation

## 2021-07-16 DIAGNOSIS — D8989 Other specified disorders involving the immune mechanism, not elsewhere classified: Secondary | ICD-10-CM | POA: Insufficient documentation

## 2021-07-17 ENCOUNTER — Telehealth: Payer: Self-pay

## 2021-07-17 ENCOUNTER — Other Ambulatory Visit: Payer: Self-pay | Admitting: Obstetrics & Gynecology

## 2021-07-17 DIAGNOSIS — N926 Irregular menstruation, unspecified: Secondary | ICD-10-CM

## 2021-07-17 NOTE — Telephone Encounter (Signed)
Patient is scheduled for 07/18/21 for labs

## 2021-07-17 NOTE — Telephone Encounter (Signed)
She should not get pregnant based on her past health history (risks) but the ablation is not birth control.  She was relying on husband w vasectomy in 2019 as means of birth control, and if that is not who she is with anymore than she needs to use other birth control.  It is not healthy for her to get pregnant now.  I have ordered beta hCG level for today or tomorrow to check please.  [place]

## 2021-07-17 NOTE — Telephone Encounter (Signed)
She should not get pregnant based on her past health history (risks) but the ablation is not birth control.  She was relying on husband w vasectomy in 2019 as means of birth control, and if that is not who she is with anymore than she needs to use other birth control.  It is not healthy for her to get pregnant now.  I have ordered beta hCG level for today or tomorrow to check please.

## 2021-07-17 NOTE — Telephone Encounter (Signed)
Pt calling; may be pregnant; wants to make sure she is not.  [phone]  Pt states she doesn't feel right down there; something is going on; is extra tired, is more [place] than normal; no pain to void; is having good Bms, LMP 06-28-21; had ablasion 2019.

## 2021-07-18 ENCOUNTER — Other Ambulatory Visit: Payer: Commercial Managed Care - PPO

## 2021-07-19 ENCOUNTER — Other Ambulatory Visit: Payer: Commercial Managed Care - PPO

## 2021-07-19 ENCOUNTER — Encounter: Payer: Self-pay | Admitting: Gastroenterology

## 2021-07-19 ENCOUNTER — Other Ambulatory Visit: Payer: Self-pay

## 2021-07-19 ENCOUNTER — Ambulatory Visit: Payer: Commercial Managed Care - PPO | Admitting: Gastroenterology

## 2021-07-19 VITALS — BP 118/71 | HR 90 | Temp 97.3°F | Ht 68.5 in | Wt 282.6 lb

## 2021-07-19 DIAGNOSIS — Z01818 Encounter for other preprocedural examination: Secondary | ICD-10-CM

## 2021-07-19 DIAGNOSIS — K219 Gastro-esophageal reflux disease without esophagitis: Secondary | ICD-10-CM

## 2021-07-19 DIAGNOSIS — N926 Irregular menstruation, unspecified: Secondary | ICD-10-CM

## 2021-07-19 NOTE — Progress Notes (Signed)
Jonathon Bellows MD, MRCP(U.K) [address]  [place]  [place], [place] [address]  Main: [phone]  Fax: [phone]   Primary Care Physician: Crecencio Mc, MD  Primary Gastroenterologist:  Dr. Jonathon Bellows   Chief complaint: Needs EGD before gastric bypass surgery.   HPI: Molly Stone is a 40 y.o. female  Summary of history : Last seen virtually in 2021.  Being followed for GERD secondary to complaint of obesity and dysphagia caused by the GERD.  She has previously been referred for weight loss surgery. In March 2020 she had increased her PPI to twice daily and all her symptoms of dysphagia had resolved.  Had an episode of bronchitis and had symptoms of acid reflux including dysphagia.  Gradually resolved.  She was on Prilosec 40 mg twice a day.    08/07/18: EGD: 3 cm hiatal hernia seen , schatzkis ring seen , dilated with 18 F dilator with minimal effect . Esophageal biopsies showed inflammation and no EOE.      Interval history 01/04/2020-07/19/2021   No new complaints presently.  On PPI for her reflux.     Current Outpatient Medications  Medication Sig Dispense Refill   ADVAIR HFA 230-21 MCG/ACT inhaler USE 2 INHALATIONS TWICE A DAY 36 g 3   albuterol (VENTOLIN HFA) 108 (90 Base) MCG/ACT inhaler USE 2 INHALATIONS EVERY 6 HOURS AS NEEDED FOR WHEEZING OR SHORTNESS OF BREATH 8.5 g 10   ALPRAZolam (XANAX) 0.25 MG tablet TAKE 1 TABLET BY MOUTH AS NEEDED FOR PANIC ATTACKS 30 tablet 5   azelastine (ASTELIN) 0.1 % nasal spray Place 1-2 sprays into both nostrils 2 (two) times daily as needed for rhinitis (nasal drainage). Use in each nostril as directed 90 mL 1   Carbinoxamine Maleate (RYVENT) 6 MG TABS Take 1 tablet by mouth 3 (three) times daily as needed (allergies). 120 tablet 11   Cholecalciferol (VITAMIN D3) 125 MCG (5000 UT) TABS Take 1 tablet by mouth daily.      clindamycin-tretinoin (ZIANA) gel Apply 1 application topically daily.     EPINEPHrine 0.3 mg/0.3 mL  IJ SOAJ injection See admin instructions. for allergic reaction     fluticasone (FLONASE) 50 MCG/ACT nasal spray Place 1 spray into both nostrils 2 (two) times daily as needed for allergies or rhinitis (nasal congestion). 48 g 1   hydrocortisone 2.5 % cream Apply topically.     metFORMIN (GLUCOPHAGE-XR) 500 MG 24 hr tablet Take by mouth.     methylPREDNISolone (MEDROL DOSEPAK) 4 MG TBPK tablet Take by mouth.     montelukast (SINGULAIR) 10 MG tablet TAKE 1 TABLET AT BEDTIME 90 tablet 3   nebivolol (BYSTOLIC) 5 MG tablet Take 1 tablet (5 mg total) by mouth daily. 90 tablet 3   pantoprazole (PROTONIX) 40 MG tablet TAKE 1 TABLET TWICE A DAY 180 tablet 1   predniSONE (DELTASONE) 20 MG tablet Take 2 tablets (40 mg total) by mouth daily with breakfast. 10 tablet 0   propranolol (INDERAL) 10 MG tablet Take 1 tablet (10 mg total) by mouth 3 (three) times daily. Take only as needed for tachycardia 60 tablet 1   Tiotropium Bromide Monohydrate (SPIRIVA RESPIMAT) 1.25 MCG/ACT AERS Inhale 2 puffs into the lungs daily. 4 g 5   No current facility-administered medications for this visit.    Allergies as of 07/19/2021 - Review Complete 07/19/2021  Allergen Reaction Noted   Buprenorphine hcl Itching and Other (See Comments) 08/18/2015   Morphine and related Itching and  Other (See Comments) 03/18/2012   Prednisone Anxiety and Palpitations 06/14/2014   Tramadol hcl Other (See Comments) 06/11/2017    ROS:  General: Negative for anorexia, weight loss, fever, chills, fatigue, weakness. ENT: Negative for hoarseness, difficulty swallowing , nasal congestion. CV: Negative for chest pain, angina, palpitations, dyspnea on exertion, peripheral edema.  Respiratory: Negative for dyspnea at rest, dyspnea on exertion, cough, sputum, wheezing.  GI: See history of present illness. GU:  Negative for dysuria, hematuria, urinary incontinence, urinary frequency, nocturnal urination.  Endo: Negative for unusual weight  change.    Physical Examination:   BP 118/71   Pulse 90   Temp (!) 97.3 F (36.3 C) (Oral)   Ht 5' 8.5" (1.74 m)   Wt 282 lb 9.6 oz (128.2 kg)   BMI 42.34 kg/m   General: Well-nourished, well-developed in no acute distress.  Eyes: No icterus. Conjunctivae Stone. Mouth: Oropharyngeal mucosa moist and Stone , no lesions erythema or exudate. Neuro: Alert and oriented x 3.  Grossly intact. Skin: Warm and dry, no jaundice.   Psych: Alert and cooperative, normal mood and affect.   Imaging Studies: No results found.  Assessment and Plan:   Molly Stone is a 40 y.o. y/o female whom I have previously seen for GERD and dysphagia.  We had discussed previously about weight loss to help with the GERD.  She had subsequently been seen by bariatric surgeon.  The plan is for bariatric surgery at [facility] and she has been referred back to me for an EGD prior to the surgery.  They have asked [place] to perform the same to rule out H. pylori as well.  Plan 1.  EGD for presurgical evaluation, will take biopsies for H. pylori at the same time.  I have discussed alternative options, risks & benefits,  which include, but are not limited to, bleeding, infection, perforation,respiratory complication & drug reaction.  The patient agrees with this plan & written consent will be obtained.       Dr Jonathon Bellows  MD,MRCP [facility] [address]) Follow up in as needed

## 2021-07-20 ENCOUNTER — Ambulatory Visit
Admission: RE | Admit: 2021-07-20 | Discharge: 2021-07-20 | Disposition: A | Discharge status: Home / Self Care | Payer: Commercial Managed Care - PPO | Attending: Gastroenterology | Admitting: Gastroenterology

## 2021-07-20 ENCOUNTER — Encounter: Payer: Self-pay | Admitting: Gastroenterology

## 2021-07-20 ENCOUNTER — Ambulatory Visit: Admit: 2021-07-20 | Payer: Commercial Managed Care - PPO | Admitting: Gastroenterology

## 2021-07-20 ENCOUNTER — Encounter
Admission: RE | Disposition: A | Discharge status: Home / Self Care | Payer: Self-pay | Source: Home / Self Care | Attending: Gastroenterology

## 2021-07-20 ENCOUNTER — Ambulatory Visit: Payer: Commercial Managed Care - PPO | Admitting: Anesthesiology

## 2021-07-20 DIAGNOSIS — Z6841 Body Mass Index (BMI) 40.0 and over, adult: Secondary | ICD-10-CM | POA: Insufficient documentation

## 2021-07-20 DIAGNOSIS — K219 Gastro-esophageal reflux disease without esophagitis: Secondary | ICD-10-CM | POA: Insufficient documentation

## 2021-07-20 DIAGNOSIS — K3184 Gastroparesis: Secondary | ICD-10-CM | POA: Diagnosis not present

## 2021-07-20 DIAGNOSIS — Z888 Allergy status to other drugs, medicaments and biological substances status: Secondary | ICD-10-CM | POA: Insufficient documentation

## 2021-07-20 DIAGNOSIS — Z7984 Long term (current) use of oral hypoglycemic drugs: Secondary | ICD-10-CM | POA: Insufficient documentation

## 2021-07-20 DIAGNOSIS — K449 Diaphragmatic hernia without obstruction or gangrene: Secondary | ICD-10-CM | POA: Insufficient documentation

## 2021-07-20 DIAGNOSIS — J45909 Unspecified asthma, uncomplicated: Secondary | ICD-10-CM | POA: Diagnosis not present

## 2021-07-20 DIAGNOSIS — Z7951 Long term (current) use of inhaled steroids: Secondary | ICD-10-CM | POA: Insufficient documentation

## 2021-07-20 DIAGNOSIS — G4730 Sleep apnea, unspecified: Secondary | ICD-10-CM | POA: Insufficient documentation

## 2021-07-20 DIAGNOSIS — Z7952 Long term (current) use of systemic steroids: Secondary | ICD-10-CM | POA: Diagnosis not present

## 2021-07-20 DIAGNOSIS — Z01818 Encounter for other preprocedural examination: Secondary | ICD-10-CM | POA: Diagnosis not present

## 2021-07-20 DIAGNOSIS — F17210 Nicotine dependence, cigarettes, uncomplicated: Secondary | ICD-10-CM | POA: Diagnosis not present

## 2021-07-20 DIAGNOSIS — I10 Essential (primary) hypertension: Secondary | ICD-10-CM | POA: Diagnosis not present

## 2021-07-20 HISTORY — PX: ESOPHAGOGASTRODUODENOSCOPY (EGD) WITH PROPOFOL: SHX5813

## 2021-07-20 LAB — BETA HCG QUANT (REF LAB): hCG Quant: <1 m[IU]/mL

## 2021-07-20 SURGERY — EGD (ESOPHAGOGASTRODUODENOSCOPY)
Anesthesia: General

## 2021-07-20 SURGERY — ESOPHAGOGASTRODUODENOSCOPY (EGD) WITH PROPOFOL
Anesthesia: General

## 2021-07-20 MED ORDER — PROPOFOL 500 MG/50ML IV EMUL
INTRAVENOUS | Status: AC
  Filled 2021-07-20: qty 50

## 2021-07-20 MED ORDER — PROPOFOL 500 MG/50ML IV EMUL
INTRAVENOUS | Status: DC | PRN
  Administered 2021-07-20: 175 ug/kg/min via INTRAVENOUS

## 2021-07-20 MED ORDER — PROPOFOL 10 MG/ML IV BOLUS
INTRAVENOUS | Status: DC | PRN
  Administered 2021-07-20: 70 mg via INTRAVENOUS

## 2021-07-20 MED ORDER — SODIUM CHLORIDE 0.9 % IV SOLN
INTRAVENOUS | Status: DC
  Administered 2021-07-20: 1000 mL via INTRAVENOUS

## 2021-07-20 NOTE — H&P (Signed)
Jonathon Bellows, MD [address], [place], [place], [place], [address] [address], [place], [place], [place], [address] Phone: [phone]  Fax: [phone]  Primary Care Physician:  Crecencio Mc, MD   Pre-Procedure History & Physical: HPI:  Molly Stone is a 41 y.o. female is here for an endoscopy    Past Medical History:  Diagnosis Date   Allergy    Anemia    Anxiety    Asthma    Chlamydia    h/o   Concussion without loss of consciousness 08/04/2016   DDD (degenerative disc disease), cervical 04/07/2019   Disorder of vocal cord    spasmodic dysphonia   Dysmenorrhea    Dyspareunia, female    Endometriosis    Family history of endometriosis    GERD (gastroesophageal reflux disease)    Headache    migraine   Heavy periods    Hiatal hernia    History of nephrolithiasis    Hypertension    Jaundice, physiologic, newborn    Lupus ([place])    Obesity (BMI 30-39.9)    Sleep apnea    Tobacco user    Vaginal Pap smear, abnormal     Past Surgical History:  Procedure Laterality Date   BONE MARROW BIOPSY     DIAGNOSTIC LAPAROSCOPY     DILATATION & CURETTAGE/HYSTEROSCOPY WITH MYOSURE N/A 08/27/2018   Procedure: DILATATION & CURETTAGE/HYSTEROSCOPY WITH MINERVA;  Surgeon: Gae Dry, MD;  Location: [facility] ORS;  Service: Gynecology;  Laterality: N/A;   DILATION AND CURETTAGE OF UTERUS     ESOPHAGOGASTRODUODENOSCOPY (EGD) WITH PROPOFOL N/A 08/07/2018   Procedure: ESOPHAGOGASTRODUODENOSCOPY (EGD) WITH PROPOFOL;  Surgeon: Jonathon Bellows, MD;  Location: [facility] ENDOSCOPY;  Service: Gastroenterology;  Laterality: N/A;   laparoscopy     ROOT CANAL     WISDOM TOOTH EXTRACTION      Prior to Admission medications   Medication Sig Start Date End Date Taking? Authorizing Provider  ADVAIR HFA 230-21 MCG/ACT inhaler USE 2 INHALATIONS TWICE A DAY 04/26/21  Yes Crecencio Mc, MD  azelastine (ASTELIN) 0.1 % nasal spray Place 1-2 sprays into both nostrils 2 (two) times daily as  needed for rhinitis (nasal drainage). Use in each nostril as directed 09/19/20  Yes Garnet Sierras, DO  Carbinoxamine Maleate (RYVENT) 6 MG TABS Take 1 tablet by mouth 3 (three) times daily as needed (allergies). 05/08/21  Yes Garnet Sierras, DO  Cholecalciferol (VITAMIN D3) 125 MCG (5000 UT) TABS Take 1 tablet by mouth daily.    Yes [provider]  clindamycin-tretinoin Pershing Proud) gel Apply 1 application topically daily. 08/30/20  Yes [provider]  fluticasone (FLONASE) 50 MCG/ACT nasal spray Place 1 spray into both nostrils 2 (two) times daily as needed for allergies or rhinitis (nasal congestion). 11/08/20  Yes Crecencio Mc, MD  hydrocortisone 2.5 % cream Apply topically. 05/07/21  Yes [provider]  metFORMIN (GLUCOPHAGE-XR) 500 MG 24 hr tablet Take by mouth. 04/30/21  Yes [provider]  montelukast (SINGULAIR) 10 MG tablet TAKE 1 TABLET AT BEDTIME 11/13/20  Yes Crecencio Mc, MD  nebivolol (BYSTOLIC) 5 MG tablet Take 1 tablet (5 mg total) by mouth daily. 11/08/20  Yes Minna Merritts, MD  pantoprazole (PROTONIX) 40 MG tablet TAKE 1 TABLET TWICE A DAY 09/07/20  Yes Jonathon Bellows, MD  propranolol (INDERAL) 10 MG tablet Take 1 tablet (10 mg total) by mouth 3 (three) times daily. Take only as needed for tachycardia 10/02/20  Yes Minna Merritts, MD  Tiotropium Bromide Monohydrate (SPIRIVA RESPIMAT) 1.25 MCG/ACT AERS Inhale 2 puffs into the lungs daily. 05/08/21  Yes Garnet Sierras, DO  albuterol (VENTOLIN HFA) 108 (90 Base) MCG/ACT inhaler USE 2 INHALATIONS EVERY 6 HOURS AS NEEDED FOR WHEEZING OR SHORTNESS OF BREATH 09/19/20   Crecencio Mc, MD  ALPRAZolam Duanne Moron) 0.25 MG tablet TAKE 1 TABLET BY MOUTH AS NEEDED FOR PANIC ATTACKS Patient not taking: Reported on 07/20/2021 01/15/21   Crecencio Mc, MD  EPINEPHrine 0.3 mg/0.3 mL IJ SOAJ injection See admin instructions. for allergic reaction 02/17/20   [provider]  methylPREDNISolone (MEDROL DOSEPAK) 4 MG  TBPK tablet Take by mouth. Patient not taking: Reported on 07/20/2021 05/07/21   [provider]  predniSONE (DELTASONE) 20 MG tablet Take 2 tablets (40 mg total) by mouth daily with breakfast. Patient not taking: Reported on 07/20/2021 06/14/21   Leone Haven, MD    Allergies as of 07/19/2021 - Review Complete 07/19/2021  Allergen Reaction Noted   Buprenorphine hcl Itching and Other (See Comments) 08/18/2015   Morphine and related Itching and Other (See Comments) 03/18/2012   Prednisone Anxiety and Palpitations 06/14/2014   Tramadol hcl Other (See Comments) 06/11/2017    Family History  Problem Relation Age of Onset   Endometriosis Mother    Allergic rhinitis Mother    Cancer Father        benign liver CA   Stroke Father        recurrent blood clots on the brain    Lupus Father    Cancer Maternal Grandmother 75       ovarian Ca, still surviving    Osteoporosis Maternal Grandmother    Ovarian cancer Maternal Grandmother    Heart disease Maternal Grandfather    Cancer Maternal Grandfather 71       colon CA. metastatic   Diabetes Maternal Uncle    Heart disease Paternal Grandmother    CAD Brother 36       early at age 15   Immunodeficiency Brother    Breast cancer Cousin    Colon cancer Neg Hx    Kidney cancer Neg Hx    Bladder Cancer Neg Hx    Angioedema Neg Hx    Atopy Neg Hx    Asthma Neg Hx    Eczema Neg Hx    Urticaria Neg Hx     Social History   Socioeconomic History   Marital status: Married    Spouse name: Not on file   Number of children: Not on file   Years of education: Not on file   Highest education level: Not on file  Occupational History   Not on file  Tobacco Use   Smoking status: Every Day    Packs/day: 0.50    Years: 10.00    Pack years: 5.00    Types: Cigarettes   Smokeless tobacco: Never  Vaping Use   Vaping Use: Former  Substance and Sexual Activity   Alcohol use: Yes    Alcohol/week: 0.0 standard drinks    Comment:  rarely   Drug use: No   Sexual activity: Yes    Birth control/protection: None    Comment: vasectomy  Other Topics Concern   Not on file  Social History Narrative   Not on file   Social Determinants of Health   Financial Resource Strain: Not on file  Food Insecurity: Not on file  Transportation Needs: Not on file  Physical  Activity: Not on file  Stress: Not on file  Social Connections: Not on file  Intimate Partner Violence: Not on file    Review of Systems: See HPI, otherwise negative ROS  Physical Exam: BP 127/80   Pulse 86   Temp (!) 96.7 F (35.9 C) (Temporal)   Resp 18   Ht 5' 9.5" (1.765 m)   Wt 128.2 kg   LMP 06/28/2021 Comment: Pregnancy test yesterday at [facility]  NEGATIVE  SpO2 100%   BMI 41.13 kg/m  General:   Alert,  pleasant and cooperative in NAD Head:  Normocephalic and atraumatic. Neck:  Supple; no masses or thyromegaly. Lungs:  Clear throughout to auscultation, normal respiratory effort.    Heart:  +S1, +S2, Regular rate and rhythm, No edema. Abdomen:  Soft, nontender and nondistended. Normal bowel sounds, without guarding, and without rebound.   Neurologic:  Alert and  oriented x4;  grossly normal neurologically.  Impression/Plan: Molly Stone is here for an endoscopy  to be performed for pre surgical evaluation before bariatric surgery     Risks, benefits, limitations, and alternatives regarding endoscopy have been reviewed with the patient.  Questions have been answered.  All parties agreeable.   Jonathon Bellows, MD  07/20/2021, 11:00 AM

## 2021-07-20 NOTE — Anesthesia Preprocedure Evaluation (Addendum)
Anesthesia Evaluation  Patient identified by MRN, date of birth, ID band Patient awake    Reviewed: Allergy & Precautions, H&P , NPO status , Patient's Chart, lab work & pertinent test results, reviewed documented beta blocker date and time   Airway Mallampati: III  TM Distance: >3 FB Neck ROM: full    Dental  (+) Teeth Intact   Pulmonary with exertion, asthma , sleep apnea , Current Smoker and Patient abstained from smoking., former smoker,    Pulmonary exam normal        Cardiovascular Exercise Tolerance: Poor hypertension, On Medications Normal cardiovascular exam+ dysrhythmias (h/o paroxysmal tachycardia )  Rate:Normal     Neuro/Psych  Headaches, PSYCHIATRIC DISORDERS Anxiety Depression  Neuromuscular disease    GI/Hepatic Neg liver ROS, hiatal hernia, GERD  Medicated and Poorly Controlled,  Endo/Other  Morbid obesity  Renal/GU negative Renal ROS  negative genitourinary   Musculoskeletal  (+) Arthritis ,   Abdominal (+) + obese,   Peds  Hematology  (+) Blood dyscrasia, anemia ,   Anesthesia Other Findings Preoperative evaluation to rule out surgical contraindication    Reproductive/Obstetrics negative OB ROS                           Anesthesia Physical  Anesthesia Plan  ASA: III  Anesthesia Plan: General   Post-op Pain Management:    Induction:   PONV Risk Score and Plan: 3 and Treatment may vary due to age or medical condition and TIVA  Airway Management Planned: Nasal Cannula and Natural Airway  Additional Equipment:   Intra-op Plan:   Post-operative Plan:   Informed Consent: I have reviewed the patients History and Physical, chart, labs and discussed the procedure including the risks, benefits and alternatives for the proposed anesthesia with the patient or authorized representative who has indicated his/her understanding and acceptance.     Dental advisory  given  Plan Discussed with: CRNA and Anesthesiologist  Anesthesia Plan Comments:        Anesthesia Quick Evaluation

## 2021-07-20 NOTE — Op Note (Signed)
[facility] Gastroenterology Patient Name: Molly Stone Procedure Date: 07/20/2021 11:20 AM MRN: [id number] Account #: [id number] Date of Birth: 07-May-1981 Admit Type: Outpatient Age: 40 Room: [facility] ENDO ROOM 4 Gender: Female Note Status: Finalized Instrument Name: Upper Endoscope [id number] Procedure:             Upper GI endoscopy Indications:           Preoperative assessment for bariatric surgery to treat                         morbid obesity Providers:             Jonathon Bellows MD, MD Referring MD:          Deborra Medina, MD (Referring MD) Medicines:             Monitored Anesthesia Care Complications:         No immediate complications. Procedure:             Pre-Anesthesia Assessment:                        - Prior to the procedure, a History and Physical was                         performed, and patient medications, allergies and                         sensitivities were reviewed. The patient's tolerance                         of previous anesthesia was reviewed.                        - The risks and benefits of the procedure and the                         sedation options and risks were discussed with the                         patient. All questions were answered and informed                         consent was obtained.                        - ASA Grade Assessment: III - A patient with severe                         systemic disease.                        After obtaining informed consent, the endoscope was                         passed under direct vision. Throughout the procedure,                         the patient's blood pressure, pulse, and oxygen                         saturations  were monitored continuously. The Endoscope                         was introduced through the mouth, and advanced to the                         third part of duodenum. The upper GI endoscopy was                         accomplished with ease. The patient tolerated  the                         procedure well. Findings:      The examined duodenum was normal.      The esophagus was normal.      A large hiatal hernia was present.      A large amount of food (residue) was found on the greater curvature of       the stomach.      The cardia and gastric fundus were normal on retroflexion. Impression:            - Normal examined duodenum.                        - Normal esophagus.                        - Large hiatal hernia.                        - A large amount of food (residue) in the stomach.                        - No specimens collected. Recommendation:        - Discharge patient to home (with escort).                        - Resume previous diet.                        - Continue present medications.                        - Await pathology results.                        - May have underlying gastroparesis due to moderate to                         large qty of greasy food seen in the stomach that                         could not be suctioned out Procedure Code(s):     --- Professional ---                        [phone], Esophagogastroduodenoscopy, flexible,                         transoral; diagnostic, including collection of  specimen(s) by brushing or washing, when performed                         (separate procedure) Diagnosis Code(s):     --- Professional ---                        K44.9, Diaphragmatic hernia without obstruction or                         gangrene                        Z01.818, Encounter for other preprocedural examination                        E66.01, Morbid (severe) obesity due to excess calories CPT copyright 2019 American Medical Association. All rights reserved. The codes documented in this report are preliminary and upon coder review may  be revised to meet current compliance requirements. Jonathon Bellows, MD Jonathon Bellows MD, MD 07/20/2021 11:40:40 AM This report has been signed  electronically. Number of Addenda: 0 Note Initiated On: 07/20/2021 11:20 AM Estimated Blood Loss:  Estimated blood loss: none.      [facility]

## 2021-07-20 NOTE — Transfer of Care (Signed)
Immediate Anesthesia Transfer of Care Note  Patient: Molly Stone  Procedure(s) Performed: ESOPHAGOGASTRODUODENOSCOPY (EGD) WITH PROPOFOL  Patient Location: PACU  Anesthesia Type:General  Level of Consciousness: awake and alert   Airway & Oxygen Therapy: Patient Spontanous Breathing and Patient connected to nasal cannula oxygen  Post-op Assessment: Report given to RN and Post -op Vital signs reviewed and stable  Post vital signs: Reviewed and stable  Last Vitals:  Vitals Value Taken Time  BP 115/71 07/20/21 1144  Temp 35.6 C 07/20/21 1144  Pulse 105 07/20/21 1146  Resp 25 07/20/21 1148  SpO2 100 % 07/20/21 1146  Vitals shown include unvalidated device data.  Last Pain:  Vitals:   07/20/21 1144  TempSrc: Temporal  PainSc: Asleep         Complications: No notable events documented.

## 2021-07-20 NOTE — Anesthesia Postprocedure Evaluation (Signed)
Anesthesia Post Note  Patient: Molly Stone  Procedure(s) Performed: ESOPHAGOGASTRODUODENOSCOPY (EGD) WITH PROPOFOL  Patient location during evaluation: Endoscopy Anesthesia Type: General Level of consciousness: awake and alert Pain management: pain level controlled Vital Signs Assessment: post-procedure vital signs reviewed and stable Respiratory status: spontaneous breathing, nonlabored ventilation and respiratory function stable Cardiovascular status: blood pressure returned to baseline and stable Postop Assessment: no apparent nausea or vomiting Anesthetic complications: no   No notable events documented.   Last Vitals:  Vitals:   07/20/21 1210 07/20/21 1220  BP: 125/82 138/83  Pulse: 96 90  Resp: 20 15  Temp:    SpO2: 100% 99%    Last Pain:  Vitals:   07/20/21 1220  TempSrc:   PainSc: 0-No pain                 [place] Ouch

## 2021-07-23 ENCOUNTER — Encounter: Payer: Self-pay | Admitting: Gastroenterology

## 2021-07-23 LAB — SURGICAL PATHOLOGY

## 2021-07-23 NOTE — Progress Notes (Deleted)
Cardiology Office Note:    Date:  07/24/2021   ID:  Chauncey Mann, DOB [birth year], MRN [id number]  PCP:  Crecencio Mc, MD  [facility] Cardiologist:  None  [facility] Electrophysiologist:  None   Referring MD: Crecencio Mc, MD   Chief Complaint: Follow-up  History of Present Illness:    Molly Stone is a 40 y.o. female with a hx of atrial tacycardia, paroxysmal tachycardia associated with alcohol intake, HTN, panic attacks, obesity, OSA not on CPAP who presents for follow-up for palpitations.   Patient first had tachypalpitations in summer 2019 while drinking. HR was up to 146bpm. Eventually self-resolved.   Prior event monitor showed NSR with I run of VT and I ron of SVT, with rare ectopy. Prior echo showed EF 55%.   Last seen 10/02/20 and palpitations were controlled on bystolic and propranolol as needed.   Today,    Past Medical History:  Diagnosis Date   Allergy    Anemia    Anxiety    Asthma    Chlamydia    h/o   Concussion without loss of consciousness 08/04/2016   DDD (degenerative disc disease), cervical 04/07/2019   Disorder of vocal cord    spasmodic dysphonia   Dysmenorrhea    Dyspareunia, female    Endometriosis    Family history of endometriosis    GERD (gastroesophageal reflux disease)    Headache    migraine   Heavy periods    Hiatal hernia    History of nephrolithiasis    Hypertension    Jaundice, physiologic, newborn    Lupus ([place])    Obesity (BMI 30-39.9)    Sleep apnea    Tobacco user    Vaginal Pap smear, abnormal     Past Surgical History:  Procedure Laterality Date   BONE MARROW BIOPSY     DIAGNOSTIC LAPAROSCOPY     DILATATION & CURETTAGE/HYSTEROSCOPY WITH MYOSURE N/A 08/27/2018   Procedure: DILATATION & CURETTAGE/HYSTEROSCOPY WITH MINERVA;  Surgeon: Gae Dry, MD;  Location: [facility] ORS;  Service: Gynecology;  Laterality: N/A;   DILATION AND CURETTAGE OF UTERUS     ESOPHAGOGASTRODUODENOSCOPY (EGD) WITH  PROPOFOL N/A 08/07/2018   Procedure: ESOPHAGOGASTRODUODENOSCOPY (EGD) WITH PROPOFOL;  Surgeon: Jonathon Bellows, MD;  Location: [facility] ENDOSCOPY;  Service: Gastroenterology;  Laterality: N/A;   ESOPHAGOGASTRODUODENOSCOPY (EGD) WITH PROPOFOL N/A 07/20/2021   Procedure: ESOPHAGOGASTRODUODENOSCOPY (EGD) WITH PROPOFOL;  Surgeon: Jonathon Bellows, MD;  Location: [facility] ENDOSCOPY;  Service: Gastroenterology;  Laterality: N/A;   laparoscopy     ROOT CANAL     WISDOM TOOTH EXTRACTION      Current Medications: No outpatient medications have been marked as taking for the 07/24/21 encounter (Appointment) with Kathlen Mody, Aziah Kaiser H, PA-C.     Allergies:   Buprenorphine hcl, Morphine and related, Prednisone, and Tramadol hcl   Social History   Socioeconomic History   Marital status: Married    Spouse name: Not on file   Number of children: Not on file   Years of education: Not on file   Highest education level: Not on file  Occupational History   Not on file  Tobacco Use   Smoking status: Every Day    Packs/day: 0.50    Years: 10.00    Pack years: 5.00    Types: Cigarettes   Smokeless tobacco: Never  Vaping Use   Vaping Use: Former  Substance and Sexual Activity   Alcohol use: Yes    Alcohol/week: 0.0 standard drinks  Comment: rarely   Drug use: No   Sexual activity: Yes    Birth control/protection: None    Comment: vasectomy  Other Topics Concern   Not on file  Social History Narrative   Not on file   Social Determinants of Health   Financial Resource Strain: Not on file  Food Insecurity: Not on file  Transportation Needs: Not on file  Physical Activity: Not on file  Stress: Not on file  Social Connections: Not on file     Family History: The patient's family history includes Allergic rhinitis in her mother; Breast cancer in her cousin; CAD (age of onset: 58) in her brother; Cancer in her father; Cancer (age of onset: 68) in her maternal grandmother; Cancer (age of onset: 30) in her  maternal grandfather; Diabetes in her maternal uncle; Endometriosis in her mother; Heart disease in her maternal grandfather and paternal grandmother; Immunodeficiency in her brother; Lupus in her father; Osteoporosis in her maternal grandmother; Ovarian cancer in her maternal grandmother; Stroke in her father. There is no history of Colon cancer, Kidney cancer, Bladder Cancer, Angioedema, Atopy, Asthma, Eczema, or Urticaria.  ROS:   Please see the history of present illness.     All other systems reviewed and are negative.  EKGs/Labs/Other Studies Reviewed:    The following studies were reviewed today:  Echo 10/2018 Study Conclusions   - Left ventricle: The cavity size was normal. Systolic function was    normal. The estimated ejection fraction was in the range of 60%    to 65%. Left ventricular diastolic function parameters were    normal.  - Mitral valve: There was mild regurgitation.  - Left atrium: The atrium was mildly dilated.  - Right ventricle: Systolic function was normal.  - Pulmonary arteries: Systolic pressure was within the normal    range.   Heart monitor 10/2018   1 run of Ventricular Tachycardia occurred lasting 16 beats with a max rate of 203 bpm (avg 181 bpm).    1 run of Supraventricular Tachycardia occurred lasting 4 beats with a max rate of 158 bpm (avg 144 bpm).    Isolated SVEs were rare (<1.0%), SVE Triplets were rare (<1.0%), and no SVE Couplets were present. Isolated VEs were rare (<1.0%), and no VE Couplets or VE Triplets were present.   Patient triggered events were not associated with significant arrhythmia   EKG:  EKG is *** ordered today.  The ekg ordered today demonstrates ***  Recent Labs: 10/03/2020: TSH 1.587 06/02/2021: BUN 13; Creatinine, Ser 0.74; Hemoglobin 14.1; Platelets 182; Potassium 3.7; Sodium 137  Recent Lipid Panel    Component Value Date/Time   CHOL 205 (H) 09/02/2019 0919   CHOL 210 (H) 04/08/2019 0949   TRIG 57.0 09/02/2019  0919   HDL 43.90 09/02/2019 0919   HDL 46 04/08/2019 0949   CHOLHDL 5 09/02/2019 0919   VLDL 11.4 09/02/2019 0919   LDLCALC 149 (H) 09/02/2019 0919   LDLCALC 151 (H) 04/08/2019 0949   LDLDIRECT 155 (H) 08/18/2015 1511     Risk Assessment/Calculations:   {Does this patient have ATRIAL FIBRILLATION?:[phone]}   Physical Exam:    VS:  LMP 06/28/2021 Comment: Pregnancy test yesterday at [facility]  NEGATIVE    Wt Readings from Last 3 Encounters:  07/20/21 282 lb 9.4 oz (128.2 kg)  07/19/21 282 lb 9.6 oz (128.2 kg)  06/20/21 282 lb (127.9 kg)     GEN: *** Well nourished, well developed in no acute distress HEENT: Normal  NECK: No JVD; No carotid bruits LYMPHATICS: No lymphadenopathy CARDIAC: ***RRR, no murmurs, rubs, gallops RESPIRATORY:  Clear to auscultation without rales, wheezing or rhonchi  ABDOMEN: Soft, non-tender, non-distended MUSCULOSKELETAL:  No edema; No deformity  SKIN: Warm and dry NEUROLOGIC:  Alert and oriented x 3 PSYCHIATRIC:  Normal affect   ASSESSMENT:    1. Paroxysmal tachycardia ([place])   2. SVT (supraventricular tachycardia) ([place])   3. Morbid obesity ([place])   4. Hyperlipidemia, mixed    PLAN:    In order of problems listed above:  Paroxysmal tachycardia  HLD  Obesity  Disposition: Follow up {follow up:15908} with ***   Shared Decision Making/Informed Consent   {Are you ordering a CV Procedure (e.g. stress test, cath, DCCV, TEE, etc)?   Press F2        :[id number]}    Signed, Phyllis Whitefield Ninfa Meeker, PA-C  07/24/2021 7:44 AM    [facility]

## 2021-07-24 ENCOUNTER — Ambulatory Visit: Payer: Commercial Managed Care - PPO | Admitting: Medical

## 2021-08-09 ENCOUNTER — Ambulatory Visit: Payer: Commercial Managed Care - PPO | Admitting: Allergy

## 2021-08-09 DIAGNOSIS — J309 Allergic rhinitis, unspecified: Secondary | ICD-10-CM

## 2021-08-09 NOTE — Progress Notes (Deleted)
Follow Up Note  RE: Molly Stone MRN: [id number] DOB: 1980/12/02 Date of Office Visit: 08/09/2021  Referring provider: Crecencio Mc, MD Primary care provider: Crecencio Mc, MD  Chief Complaint: No chief complaint on file.  History of Present Illness: I had the pleasure of seeing Molly Stone for a follow up visit at the [facility] on 08/09/2021. She is a 40 y.o. female, who is being followed for asthma, allergic rhinoconjunctivitis, heartburn, history of frequent upper respiratory infections and rash. Her previous allergy office visit was on 05/08/2021 with Dr. Maudie Mercury. Today is a regular follow up visit.  Moderate persistent asthma without complication Past history - Diagnosed with asthma over 20 years ago. Currently on Advair 268mcg 2 puffs BID, Singulair daily and using albuterol as needed. Albuterol was causing palpitations but xopenex nebulizer caused throat tightness. 2021 spirometry was normal with no improvement in FEV1 post bronchodilator treatment. Clinically feeling the same. Interestingly patient was coughing after the treatment.  Interim history - flared in June with bronchitis and had 1 course of prednisone. Now on prednisone for rash.  Today's spirometry was normal.  ACT score 15.  Daily controller medication(s): Advair 282mcg 2 puffs twice a day with spacer and rinse mouth afterwards. START Spiriva 1.43mcg 2 puffs once a day. Sample given. Demonstrated proper use. Coupon given. Continue Singulair 10mg  daily.  May use albuterol rescue inhaler 2 puffs every 4 to 6 hours as needed for shortness of breath, chest tightness, coughing, and wheezing. May use albuterol rescue inhaler 2 puffs 5 to 15 minutes prior to strenuous physical activities. Monitor frequency of use.  Get spirometry at next visit.   Seasonal and perennial allergic rhinoconjunctivitis Past history - Perennial rhino conjunctivitis symptoms for 30+ years. 3-4 sinus infections per year.  Prior blood work was positive to dust mites, cat, dog, grass, cockroach, tree.  Borderline positive to ragweed and weed. 2021 skin testing showed: Positive to grass, weed, ragweed, trees, mold, dust mites, cat, dog, cockroach. Negative control slightly positive as well.  Interim history - increased PND. Continue environmental control measures as below. Stop zyrtec. Start Ryvent (carbinoxamine 6mg ) every 8 hours as needed. Sample given. Use Flonase (fluticasone) nasal spray 1 spray per nostril twice a day as needed for nasal congestion.  Use azelastine nasal spray 1-2 sprays per nostril twice a day as needed for runny nose/drainage. Nasal saline spray (i.e., Simply Saline) or nasal saline lavage (i.e., NeilMed) is recommended as needed and prior to medicated nasal sprays. Continue Singulair (montelukast) 10mg  daily at night. Patient would be a good candidate for allergy immunotherapy however she lives 1 hour away and has not found a local allergist yet.    Heartburn Continue Protonix 40mg  twice a day. Continue lifestyle and dietary modifications.   History of frequent upper respiratory infection Past history - frequent URIs and had 6 antibiotics this year. Being followed by hematology for low WBC.  Interim history - 1 bronchitis in June.  Keep track of infections.   Rash and other nonspecific skin eruption Continue proper skin care.   Assessment and Plan: Molly Stone is a 40 y.o. female with: No problem-specific Assessment & Plan notes found for this encounter.  No follow-ups on file.  No orders of the defined types were placed in this encounter.  Lab Orders  No laboratory test(s) ordered today    Diagnostics: Spirometry:  Tracings reviewed. Her effort: {Blank single:19197::"Good reproducible efforts.","It was hard to get consistent efforts and there is  a question as to whether this reflects a maximal maneuver.","Poor effort, data can not be interpreted."} FVC: ***L FEV1: ***L, ***%  predicted FEV1/FVC ratio: ***% Interpretation: {Blank single:19197::"Spirometry consistent with mild obstructive disease","Spirometry consistent with moderate obstructive disease","Spirometry consistent with severe obstructive disease","Spirometry consistent with possible restrictive disease","Spirometry consistent with mixed obstructive and restrictive disease","Spirometry uninterpretable due to technique","Spirometry consistent with normal pattern","No overt abnormalities noted given today's efforts"}.  Please see scanned spirometry results for details.  Skin Testing: {Blank single:19197::"Select foods","Environmental allergy panel","Environmental allergy panel and select foods","Food allergy panel","None","Deferred due to recent antihistamines use"}. *** Results discussed with patient/family.   Medication List:  Current Outpatient Medications  Medication Sig Dispense Refill  . ADVAIR HFA 230-21 MCG/ACT inhaler USE 2 INHALATIONS TWICE A DAY 36 g 3  . albuterol (VENTOLIN HFA) 108 (90 Base) MCG/ACT inhaler USE 2 INHALATIONS EVERY 6 HOURS AS NEEDED FOR WHEEZING OR SHORTNESS OF BREATH 8.5 g 10  . ALPRAZolam (XANAX) 0.25 MG tablet TAKE 1 TABLET BY MOUTH AS NEEDED FOR PANIC ATTACKS (Patient not taking: Reported on 07/20/2021) 30 tablet 5  . azelastine (ASTELIN) 0.1 % nasal spray Place 1-2 sprays into both nostrils 2 (two) times daily as needed for rhinitis (nasal drainage). Use in each nostril as directed 90 mL 1  . Carbinoxamine Maleate (RYVENT) 6 MG TABS Take 1 tablet by mouth 3 (three) times daily as needed (allergies). 120 tablet 11  . Cholecalciferol (VITAMIN D3) 125 MCG (5000 UT) TABS Take 1 tablet by mouth daily.     . clindamycin-tretinoin (ZIANA) gel Apply 1 application topically daily.    Marland Kitchen EPINEPHrine 0.3 mg/0.3 mL IJ SOAJ injection See admin instructions. for allergic reaction    . fluticasone (FLONASE) 50 MCG/ACT nasal spray Place 1 spray into both nostrils 2 (two) times daily as needed  for allergies or rhinitis (nasal congestion). 48 g 1  . hydrocortisone 2.5 % cream Apply topically.    . metFORMIN (GLUCOPHAGE-XR) 500 MG 24 hr tablet Take by mouth.    . methylPREDNISolone (MEDROL DOSEPAK) 4 MG TBPK tablet Take by mouth. (Patient not taking: Reported on 07/20/2021)    . montelukast (SINGULAIR) 10 MG tablet TAKE 1 TABLET AT BEDTIME 90 tablet 3  . nebivolol (BYSTOLIC) 5 MG tablet Take 1 tablet (5 mg total) by mouth daily. 90 tablet 3  . pantoprazole (PROTONIX) 40 MG tablet TAKE 1 TABLET TWICE A DAY 180 tablet 1  . predniSONE (DELTASONE) 20 MG tablet Take 2 tablets (40 mg total) by mouth daily with breakfast. (Patient not taking: Reported on 07/20/2021) 10 tablet 0  . propranolol (INDERAL) 10 MG tablet Take 1 tablet (10 mg total) by mouth 3 (three) times daily. Take only as needed for tachycardia 60 tablet 1  . Tiotropium Bromide Monohydrate (SPIRIVA RESPIMAT) 1.25 MCG/ACT AERS Inhale 2 puffs into the lungs daily. 4 g 5   No current facility-administered medications for this visit.   Allergies: Allergies  Allergen Reactions  . Buprenorphine Hcl Itching and Other (See Comments)    And shaking  . Morphine And Related Itching and Other (See Comments)    And shaking  . Prednisone Anxiety and Palpitations  . Tramadol Hcl Other (See Comments)    anxious   I reviewed her past medical history, social history, family history, and environmental history and no significant changes have been reported from her previous visit.  Review of Systems  Constitutional:  Negative for appetite change, chills, fever and unexpected weight change.  HENT:  Positive for postnasal drip.  Negative for congestion and rhinorrhea.   Eyes:  Negative for itching.  Respiratory:  Negative for cough, chest tightness, shortness of breath and wheezing.   Cardiovascular:  Negative for chest pain.  Gastrointestinal:  Negative for abdominal pain.  Genitourinary:  Negative for difficulty urinating.  Skin:  Positive  for rash.  Allergic/Immunologic: Positive for environmental allergies.   Objective: There were no vitals taken for this visit. There is no height or weight on file to calculate BMI. Physical Exam Vitals and nursing note reviewed.  Constitutional:      Appearance: Normal appearance. She is well-developed. She is obese.  HENT:     Head: Normocephalic and atraumatic.     Right Ear: Tympanic membrane and external ear normal.     Left Ear: Tympanic membrane and external ear normal.     Nose: Nose normal.     Mouth/Throat:     Mouth: Mucous membranes are moist.     Pharynx: Oropharynx is clear.  Eyes:     Conjunctiva/sclera: Conjunctivae normal.  Cardiovascular:     Rate and Rhythm: Normal rate and regular rhythm.     Heart sounds: Normal heart sounds. No murmur heard.   No friction rub. No gallop.  Pulmonary:     Effort: Pulmonary effort is normal.     Breath sounds: Normal breath sounds. No wheezing, rhonchi or rales.  Musculoskeletal:     Cervical back: Neck supple.  Skin:    General: Skin is warm.     Findings: Rash present.     Comments: Erythematous rash on forehead  Neurological:     Mental Status: She is alert and oriented to person, place, and time.  Psychiatric:        Behavior: Behavior normal.  Previous notes and tests were reviewed. The plan was reviewed with the patient/family, and all questions/concerned were addressed.  It was my pleasure to see Molly Stone today and participate in her care. Please feel free to contact me with any questions or concerns.  Sincerely,  Rexene Alberts, DO Allergy & Immunology  [facility] office: [place] office: [phone]

## 2021-08-21 ENCOUNTER — Other Ambulatory Visit: Payer: Self-pay

## 2021-08-21 ENCOUNTER — Ambulatory Visit: Payer: Commercial Managed Care - PPO | Admitting: Obstetrics & Gynecology

## 2021-08-21 ENCOUNTER — Encounter: Payer: Self-pay | Admitting: Obstetrics & Gynecology

## 2021-08-21 VITALS — BP 118/80 | Ht 69.0 in | Wt 277.0 lb

## 2021-08-21 DIAGNOSIS — Z30013 Encounter for initial prescription of injectable contraceptive: Secondary | ICD-10-CM

## 2021-08-21 DIAGNOSIS — N926 Irregular menstruation, unspecified: Secondary | ICD-10-CM

## 2021-08-21 MED ORDER — MEDROXYPROGESTERONE ACETATE 150 MG/ML IM SUSP: 150.0000 mg | INTRAMUSCULAR | 3 refills | Status: DC

## 2021-08-21 NOTE — Patient Instructions (Signed)
Medroxyprogesterone Injection (Contraception) What is this medication? MEDROXYPROGESTERONE (me DROX ee proe JES te rone) prevents ovulation and pregnancy. It belongs to a group of medications called contraceptives. This medication is a progestin hormone. This medicine may be used for other purposes; ask your health care provider or pharmacist if you have questions. COMMON BRAND NAME(S): Depo-Provera, Depo-subQ Provera 104 What should I tell my care team before I take this medication? They need to know if you have any of these conditions: Asthma Blood clots Breast cancer or family history of breast cancer Depression Diabetes Eating disorder (anorexia nervosa) Heart attack High blood pressure HIV infection or AIDS If you often drink alcohol Kidney disease Liver disease Migraine headaches Osteoporosis, weak bones Seizures Stroke Tobacco smoker Vaginal bleeding An unusual or allergic reaction to medroxyprogesterone, other hormones, medications, foods, dyes, or preservatives Pregnant or trying to get pregnant Breast-feeding How should I use this medication? Depo-Provera CI contraceptive injection is given into a muscle. Depo-subQ Provera 104 injection is given under the skin. It is given in a hospital or clinic setting. The injection is usually given during the first 5 days after the start of a menstrual period or 6 weeks after delivery of a baby. A patient package insert for the product will be given with each prescription and refill. Be sure to read this information carefully each time. The sheet may change often. Talk to your care team about the use of this medication in children. Special care may be needed. These injections have been used in female children who have started having menstrual periods. Overdosage: If you think you have taken too much of this medicine contact a poison control center or emergency room at once. NOTE: This medicine is only for you. Do not share this medicine  with others. What if I miss a dose? Keep appointments for follow-up doses. You must get an injection once every 3 months. It is important not to miss your dose. Call your care team if you are unable to keep an appointment. What may interact with this medication? Antibiotics or medications for infections, especially rifampin and griseofulvin Antivirals for HIV or hepatitis Aprepitant Armodafinil Bexarotene Bosentan Medications for seizures like carbamazepine, felbamate, oxcarbazepine, phenytoin, phenobarbital, primidone, topiramate Mitotane Modafinil St. John's wort This list may not describe all possible interactions. Give your health care provider a list of all the medicines, herbs, non-prescription drugs, or dietary supplements you use. Also tell them if you smoke, drink alcohol, or use illegal drugs. Some items may interact with your medicine. What should I watch for while using this medication? This medication does not protect you against HIV infection (AIDS) or other sexually transmitted diseases. Use of this product may cause you to lose calcium from your bones. Loss of calcium may cause weak bones (osteoporosis). Only use this product for more than 2 years if other forms of birth control are not right for you. The longer you use this product for birth control the more likely you will be at risk for weak bones. Ask your care team how you can keep strong bones. You may have a change in bleeding pattern or irregular periods. Many females stop having periods while taking this medication. If you have received your injections on time, your chance of being pregnant is very low. If you think you may be pregnant, see your care team as soon as possible. Tell your care team if you want to get pregnant within the next year. The effect of this medication may last a  long time after you get your last injection. What side effects may I notice from receiving this medication? Side effects that you should  report to your care team as soon as possible: Allergic reactions-skin rash, itching, hives, swelling of the face, lips, tongue, or throat Blood clot-pain, swelling, or warmth in the leg, shortness of breath, chest pain Gallbladder problems-severe stomach pain, nausea, vomiting, fever Increase in blood pressure Liver injury-right upper belly pain, loss of appetite, nausea, light-colored stool, dark yellow or brown urine, yellowing skin or eyes, unusual weakness or fatigue New or worsening migraines or headaches Seizures Stroke-sudden numbness or weakness of the face, arm, or leg, trouble speaking, confusion, trouble walking, loss of balance or coordination, dizziness, severe headache, change in vision Unusual vaginal discharge, itching, or odor Worsening mood, feelings of depression Side effects that usually do not require medical attention (report to your care team if they continue or are bothersome): Breast pain or tenderness Dark patches of the skin on the face or other sun-exposed areas Irregular menstrual cycles or spotting Nausea Weight gain This list may not describe all possible side effects. Call your doctor for medical advice about side effects. You may report side effects to FDA at 1-800-FDA-1088. Where should I keep my medication? This injection is only given by a care team. It will not be stored at home. NOTE: This sheet is a summary. It may not cover all possible information. If you have questions about this medicine, talk to your doctor, pharmacist, or health care provider.  2022 Elsevier/Gold Standard (2020-11-13 12:23:33)   Levonorgestrel Intrauterine Device What is this medication? LEVONORGESTREL (LEE voe nor jes trel) prevents ovulation and pregnancy. It may also be used to treat heavy periods. It belongs to a group of medications called contraceptives. This medication is a progestin hormone. This medicine may be used for other purposes; ask your health care provider or  pharmacist if you have questions. COMMON BRAND NAME(S): Minette Headland What should I tell my care team before I take this medication? They need to know if you have any of these conditions: Abnormal Pap smear Cancer of the breast, uterus, or cervix Diabetes Endometritis Genital or pelvic infection now or in the past Have more than one sexual partner or your partner has more than one partner Heart disease History of an ectopic or tubal pregnancy Immune system problems IUD in place Liver disease or tumor Problems with blood clots or take blood-thinners Seizures Use intravenous drugs Uterus of unusual shape Vaginal bleeding that has not been explained An unusual or allergic reaction to levonorgestrel, other hormones, silicone, or polyethylene, medications, foods, dyes, or preservatives Pregnant or trying to get pregnant Breast-feeding How should I use this medication? This device is placed inside the uterus by your care team. A patient package insert for the product will be given each time it is inserted. Be sure to read this information carefully each time. The sheet may change often. Talk to your care team about use of this medication in children. Special care may be needed. Overdosage: If you think you have taken too much of this medicine contact a poison control center or emergency room at once. NOTE: This medicine is only for you. Do not share this medicine with others. What if I miss a dose? This does not apply. Depending on the brand of device you have inserted, the device will need to be replaced every 3 to 7 years if you wish to continue using this type of birth control.  What may interact with this medication? Interactions are not expected. Tell your care team about all the medications you take. This list may not describe all possible interactions. Give your health care provider a list of all the medicines, herbs, non-prescription drugs, or dietary supplements  you use. Also tell them if you smoke, drink alcohol, or use illegal drugs. Some items may interact with your medicine. What should I watch for while using this medication? Visit your care team for regular check-ups. Tell your care team if you or your partner becomes HIV positive or gets a sexually transmitted disease. This product does not protect you against HIV infection (AIDS) or other sexually transmitted diseases. You can check the placement of the IUD yourself by reaching up to the top of your vagina with clean fingers to feel the threads. Do not pull on the threads. It is a good habit to check placement after each menstrual period. Call your care team right away if you feel more of the IUD than just the threads or if you cannot feel the threads at all. The IUD may come out by itself. You may become pregnant if the device comes out. If you notice that the IUD has come out use a backup birth control method like condoms and call your care team. Using tampons will not change the position of the IUD and are okay to use during your period. This IUD can be safely scanned with magnetic resonance imaging (MRI) only under specific conditions. Before you have an MRI, tell your care team that you have an IUD in place, and which type of IUD you have in place. What side effects may I notice from receiving this medication? Side effects that you should report to your care team as soon as possible: Allergic reactions-skin rash, itching, hives, swelling of the face, lips, tongue, or throat Blood clot-pain, swelling, or warmth in the leg, shortness of breath, chest pain Gallbladder problems-severe stomach pain, nausea, vomiting, fever Increase in blood pressure Liver injury-right upper belly pain, loss of appetite, nausea, light-colored stool, dark yellow or brown urine, yellowing skin or eyes, unusual weakness or fatigue New or worsening migraines or headaches Pelvic inflammatory disease (PID)-fever, abdominal  pain, pelvic pain, pain or trouble passing urine, spotting, bleeding during or after sex Stroke-sudden numbness or weakness of the face, arm, or leg, trouble speaking, confusion, trouble walking, loss of balance or coordination, dizziness, severe headache, change in vision Unusual vaginal discharge, itching, or odor Vaginal pain, irritation, or sores Worsening mood, feelings of depression Side effects that usually do not require medical attention (report to your care team if they continue or are bothersome): Breast pain or tenderness Dark patches of skin on the face or other sun-exposed areas Irregular menstrual cycles or spotting Nausea Weight gain This list may not describe all possible side effects. Call your doctor for medical advice about side effects. You may report side effects to FDA at 1-800-FDA-1088. Where should I keep my medication? This does not apply. NOTE: This sheet is a summary. It may not cover all possible information. If you have questions about this medicine, talk to your doctor, pharmacist, or health care provider.  2022 Elsevier/Gold Standard (2020-11-13 16:46:12)

## 2021-08-21 NOTE — Progress Notes (Signed)
Contraception Counseling Patient is a 40 yo [id number] WF who presents for contraception counseling. The patient has no complaints today. The patient is sexually active.  Her former partner (now ex husband) had a vasectomy.  She is now needing contraception.  Prior ablation but did not work- has heavy 5-6 day periods. Planning gastric bypass surgery in Jan 2023 and needs longer term birth control for at least a year after surgery.   Pertinent past medical history: hypertension.   PMHx: She  has a past medical history of Allergy, Anemia, Anxiety, Asthma, Chlamydia, Concussion without loss of consciousness (08/04/2016), DDD (degenerative disc disease), cervical (04/07/2019), Disorder of vocal cord, Dysmenorrhea, Dyspareunia, female, Endometriosis, Family history of endometriosis, GERD (gastroesophageal reflux disease), Headache, Heavy periods, Hiatal hernia, History of nephrolithiasis, Hypertension, Jaundice, physiologic, newborn, Lupus ([place]), Obesity (BMI 30-39.9), Sleep apnea, Tobacco user, and Vaginal Pap smear, abnormal. Also,  has a past surgical history that includes Dilation and curettage of uterus; Wisdom tooth extraction; laparoscopy; Esophagogastroduodenoscopy (egd) with propofol (N/A, 08/07/2018); Dilatation & curettage/hysteroscopy with myosure (N/A, 08/27/2018); Root canal; Bone marrow biopsy; Diagnostic laparoscopy; and Esophagogastroduodenoscopy (egd) with propofol (N/A, 07/20/2021)., family history includes Allergic rhinitis in her mother; Breast cancer in her cousin; CAD (age of onset: 90) in her brother; Cancer in her father; Cancer (age of onset: 30) in her maternal grandmother; Cancer (age of onset: 55) in her maternal grandfather; Diabetes in her maternal uncle; Endometriosis in her mother; Heart disease in her maternal grandfather and paternal grandmother; Immunodeficiency in her brother; Lupus in her father; Osteoporosis in her maternal grandmother; Ovarian cancer in her maternal grandmother;  Stroke in her father.,  reports that she has been smoking cigarettes. She has a 5.00 pack-year smoking history. She has never used smokeless tobacco. She reports current alcohol use. She reports that she does not use drugs.  She has a current medication list which includes the following prescription(s): advair hfa, albuterol, alprazolam, azelastine, carbinoxamine maleate, vitamin d3, clindamycin-tretinoin, epinephrine, fluticasone, hydrocortisone, medroxyprogesterone, metformin, methylprednisolone, montelukast, nebivolol, pantoprazole, prednisone, propranolol, and spiriva respimat. Also, is allergic to buprenorphine hcl, morphine and related, prednisone, and tramadol hcl.  Review of Systems  Constitutional:  Negative for chills, fever and malaise/fatigue.  HENT:  Negative for congestion, sinus pain and sore throat.   Eyes:  Negative for blurred vision and pain.  Respiratory:  Negative for cough and wheezing.   Cardiovascular:  Negative for chest pain and leg swelling.  Gastrointestinal:  Negative for abdominal pain, constipation, diarrhea, heartburn, nausea and vomiting.  Genitourinary:  Negative for dysuria, frequency, hematuria and urgency.  Musculoskeletal:  Negative for back pain, joint pain, myalgias and neck pain.  Skin:  Negative for itching and rash.  Neurological:  Negative for dizziness, tremors and weakness.  Endo/Heme/Allergies:  Does not bruise/bleed easily.  Psychiatric/Behavioral:  Negative for depression. The patient is not nervous/anxious and does not have insomnia.    Objective: BP 118/80   Ht _0  (1.753 m)   Wt 277 lb (125.6 kg)   LMP 06/22/2021   BMI 40.91 kg/m  Physical Exam Constitutional:      General: She is not in acute distress.    Appearance: She is well-developed.  Musculoskeletal:        General: Normal range of motion.  Neurological:     Mental Status: She is alert and oriented to person, place, and time.  Skin:    General: Skin is warm and dry.   Vitals reviewed.    ASSESSMENT/PLAN:    Problem List  Items Addressed This Visit     Encounter for initial prescription of injectable contraceptive    -  Primary   Irregular periods       Birth Control I discussed multiple birth control options and methods with the patient.  The risks and benefits of each were reviewed.  The possible side effects including deep venous thrombosis, breast tenderness, fluid retention, mood changes and abnormal vaginal bleeding were discussed.  Combination as well as progesterone-only options, pros and cons counseled.  Plan Depo (second option Mirena) Info gv on each Rx    Barnett Applebaum, MD, [facility], [facility] 08/21/2021  4:19 PM

## 2021-08-23 ENCOUNTER — Telehealth: Payer: Self-pay | Admitting: Oncology

## 2021-08-23 ENCOUNTER — Ambulatory Visit (INDEPENDENT_AMBULATORY_CARE_PROVIDER_SITE_OTHER): Payer: Commercial Managed Care - PPO

## 2021-08-23 ENCOUNTER — Other Ambulatory Visit: Payer: Self-pay

## 2021-08-23 DIAGNOSIS — Z3042 Encounter for surveillance of injectable contraceptive: Secondary | ICD-10-CM

## 2021-08-23 LAB — POCT URINE PREGNANCY: Preg Test, Ur: NEGATIVE

## 2021-08-23 MED ORDER — MEDROXYPROGESTERONE ACETATE 150 MG/ML IM SUSP
150.0000 mg | Freq: Once | INTRAMUSCULAR | Status: AC
  Administered 2021-08-23: 150 mg via INTRAMUSCULAR

## 2021-08-23 NOTE — Progress Notes (Signed)
Pt here for her first inj of depo which was given IM right glut; negative urine preg test; pt tolerated well.  NDC# [phone]

## 2021-08-23 NOTE — Telephone Encounter (Signed)
Pt called in wanting to knoe her lab results. Please give her a call at [phone]

## 2021-08-23 NOTE — Telephone Encounter (Addendum)
Called pt and let her know that the last time she had labs was august.. she had appt but missed it due to family sickness and then in 3 weeks they passed away. She has asked to come and get labs since she missed them. She is off work 08/30/2021. Appt made and pt will be there

## 2021-08-27 ENCOUNTER — Other Ambulatory Visit: Payer: Self-pay

## 2021-08-30 ENCOUNTER — Other Ambulatory Visit: Payer: Self-pay | Admitting: Allergy

## 2021-08-30 ENCOUNTER — Inpatient Hospital Stay: Payer: Commercial Managed Care - PPO | Attending: Nurse Practitioner

## 2021-08-30 ENCOUNTER — Other Ambulatory Visit: Payer: Self-pay

## 2021-08-30 DIAGNOSIS — Z79899 Other long term (current) drug therapy: Secondary | ICD-10-CM | POA: Diagnosis not present

## 2021-08-30 DIAGNOSIS — D709 Neutropenia, unspecified: Secondary | ICD-10-CM | POA: Diagnosis not present

## 2021-08-30 DIAGNOSIS — G4733 Obstructive sleep apnea (adult) (pediatric): Secondary | ICD-10-CM | POA: Insufficient documentation

## 2021-08-30 DIAGNOSIS — D708 Other neutropenia: Secondary | ICD-10-CM

## 2021-08-30 LAB — CBC WITH DIFFERENTIAL/PLATELET
Abs Immature Granulocytes: 0.01 10*3/uL (ref 0.00–0.07)
Basophils Absolute: 0 10*3/uL (ref 0.0–0.1)
Basophils Relative: 1 %
Eosinophils Absolute: 0.4 10*3/uL (ref 0.0–0.5)
Eosinophils Relative: 10 %
HCT: 44.3 % (ref 36.0–46.0)
Hemoglobin: 15.3 g/dL — ABNORMAL HIGH (ref 12.0–15.0)
Immature Granulocytes: 0 %
Lymphocytes Relative: 32 %
Lymphs Abs: 1.4 10*3/uL (ref 0.7–4.0)
MCH: 31.9 pg (ref 26.0–34.0)
MCHC: 34.5 g/dL (ref 30.0–36.0)
MCV: 92.5 fL (ref 80.0–100.0)
Monocytes Absolute: 0.6 10*3/uL (ref 0.1–1.0)
Monocytes Relative: 14 %
Neutro Abs: 1.9 10*3/uL (ref 1.7–7.7)
Neutrophils Relative %: 43 %
Platelets: 177 10*3/uL (ref 150–400)
RBC: 4.79 MIL/uL (ref 3.87–5.11)
RDW: 13 % (ref 11.5–15.5)
WBC: 4.5 10*3/uL (ref 4.0–10.5)
nRBC: 0 % (ref 0.0–0.2)

## 2021-09-11 ENCOUNTER — Other Ambulatory Visit: Payer: Self-pay

## 2021-09-11 ENCOUNTER — Ambulatory Visit (INDEPENDENT_AMBULATORY_CARE_PROVIDER_SITE_OTHER): Payer: Commercial Managed Care - PPO

## 2021-09-11 DIAGNOSIS — Z23 Encounter for immunization: Secondary | ICD-10-CM | POA: Diagnosis not present

## 2021-09-18 ENCOUNTER — Other Ambulatory Visit: Payer: Self-pay | Admitting: Allergy

## 2021-09-28 ENCOUNTER — Telehealth: Payer: Self-pay | Admitting: Oncology

## 2021-09-28 NOTE — Telephone Encounter (Signed)
Pt call to reschedule her appt for 12-16. Please give her a call back at [phone]

## 2021-10-05 ENCOUNTER — Ambulatory Visit: Payer: Commercial Managed Care - PPO | Admitting: Nurse Practitioner

## 2021-10-05 ENCOUNTER — Other Ambulatory Visit: Payer: Commercial Managed Care - PPO

## 2021-10-05 ENCOUNTER — Telehealth: Payer: Self-pay

## 2021-10-05 NOTE — Telephone Encounter (Signed)
Spoke w/patient. Advised BTB normal with change/start of new birth control. Give time. Can discuss if still having bleeding after second injection. Contact office with any further questions or concerns prior.

## 2021-10-05 NOTE — Telephone Encounter (Signed)
Patient started Depo about 1 month. She has had a lot of break thru bleeding that's been going on for about 3 weeks. Cb#[phone]

## 2021-10-18 ENCOUNTER — Other Ambulatory Visit: Payer: Self-pay | Admitting: Internal Medicine

## 2021-10-18 ENCOUNTER — Other Ambulatory Visit: Payer: Self-pay | Admitting: Allergy

## 2021-10-30 ENCOUNTER — Telehealth: Payer: Self-pay | Admitting: Nurse Practitioner

## 2021-10-30 ENCOUNTER — Inpatient Hospital Stay: Payer: Commercial Managed Care - PPO | Admitting: Nurse Practitioner

## 2021-10-30 ENCOUNTER — Inpatient Hospital Stay: Payer: Commercial Managed Care - PPO | Attending: Nurse Practitioner

## 2021-10-30 NOTE — Telephone Encounter (Signed)
Pt called to reschedule her appt for today. Call back at [phone]

## 2021-11-02 DIAGNOSIS — D72819 Decreased white blood cell count, unspecified: Secondary | ICD-10-CM | POA: Insufficient documentation

## 2021-11-05 NOTE — Progress Notes (Signed)
Pt stated that symptoms started a week ago with runny nose, sore throat & watery eyes. Has progressed with cough, chest congestion that is yellow in color. Tested negative for Covid. Pt stated that she has still been working but feels run down.

## 2021-11-06 ENCOUNTER — Telehealth (INDEPENDENT_AMBULATORY_CARE_PROVIDER_SITE_OTHER): Payer: Commercial Managed Care - PPO | Admitting: Family

## 2021-11-06 ENCOUNTER — Other Ambulatory Visit: Payer: Self-pay | Admitting: Allergy

## 2021-11-06 DIAGNOSIS — J209 Acute bronchitis, unspecified: Secondary | ICD-10-CM

## 2021-11-06 DIAGNOSIS — R059 Cough, unspecified: Secondary | ICD-10-CM

## 2021-11-06 MED ORDER — FLUCONAZOLE 150 MG PO TABS: 150.0000 mg | ORAL_TABLET | Freq: Every day | ORAL | Status: DC

## 2021-11-06 MED ORDER — AMOXICILLIN-POT CLAVULANATE 875-125 MG PO TABS: 1.0000 | ORAL_TABLET | Freq: Two times a day (BID) | ORAL | 0 refills | Status: DC

## 2021-11-06 MED ORDER — METHYLPREDNISOLONE 4 MG PO TBPK: ORAL_TABLET | ORAL | 0 refills | Status: DC

## 2021-11-08 ENCOUNTER — Other Ambulatory Visit: Payer: Self-pay

## 2021-11-08 ENCOUNTER — Other Ambulatory Visit: Payer: Self-pay | Admitting: Internal Medicine

## 2021-11-08 ENCOUNTER — Telehealth: Payer: Self-pay | Admitting: Internal Medicine

## 2021-11-08 MED ORDER — FLUCONAZOLE 150 MG PO TABS: 150.0000 mg | ORAL_TABLET | Freq: Every day | ORAL | 0 refills | Status: DC

## 2021-11-08 MED ORDER — FLUCONAZOLE 150 MG PO TABS: 150.0000 mg | ORAL_TABLET | Freq: Every day | ORAL | Status: DC

## 2021-11-08 NOTE — Telephone Encounter (Signed)
Patient called in stated the pharmacy did not receive request  for  fluconazole (DIFLUCAN) 150 MG tablet  to be filled. Please send to pharmacy [facility]  # [phone]

## 2021-11-08 NOTE — Progress Notes (Signed)
Virtual Visit via Telephone Note  I connected with Molly Stone on 11/08/21 at  2:45 PM EST by telephone and verified that I am speaking with the correct person using two identifiers.  Location: Patient: Home Provider: [employer]   I discussed the limitations, risks, security and privacy concerns of performing an evaluation and management service by telephone and the availability of in person appointments. I also discussed with the patient that there may be a patient responsible charge related to this service. The patient expressed understanding and agreed to proceed.   History of Present Illness: 41 year old female presents virtually with cough, congestion, chest congestion, fatigue, and headache x 1 week. Has been taking Nyquil and Dayquil. COVID test is negative. Non smoker. No improvements. Reports antibiotics causing yeast infections.     Observations/Objective: A&O, NAD   Assessment and Plan: Molly Stone was seen today for cough, nasal congestion, chest congestion and fatigue.  Diagnoses and all orders for this visit:  Cough in adult  Acute bronchitis, unspecified organism  Other orders -     amoxicillin-clavulanate (AUGMENTIN) 875-125 MG tablet; Take 1 tablet by mouth 2 (two) times daily. -     Discontinue: fluconazole (DIFLUCAN) 150 MG tablet; Take 1 tablet (150 mg total) by mouth daily. -     methylPREDNISolone (MEDROL DOSEPAK) 4 MG TBPK tablet; As needed    Call the office if symptoms worsen or persist. Recheck as scheduled and sooner as needed.   Follow Up Instructions:    I discussed the assessment and treatment plan with the patient. The patient was provided an opportunity to ask questions and all were answered. The patient agreed with the plan and demonstrated an understanding of the instructions.   The patient was advised to call back or seek an in-person evaluation if the symptoms worsen or if the condition fails to improve as anticipated.  I  provided 20 minutes of non-face-to-face time during this encounter.   Kennyth Arnold, FNP

## 2021-11-08 NOTE — Telephone Encounter (Signed)
Sent to [facility]

## 2021-11-08 NOTE — Telephone Encounter (Signed)
Called patient & she is aware Diflucan sent.

## 2021-11-14 ENCOUNTER — Other Ambulatory Visit: Payer: Self-pay | Admitting: Internal Medicine

## 2021-11-15 ENCOUNTER — Ambulatory Visit: Payer: Commercial Managed Care - PPO

## 2021-11-16 ENCOUNTER — Other Ambulatory Visit: Payer: Self-pay

## 2021-11-16 ENCOUNTER — Ambulatory Visit (INDEPENDENT_AMBULATORY_CARE_PROVIDER_SITE_OTHER): Payer: Commercial Managed Care - PPO

## 2021-11-16 DIAGNOSIS — Z3042 Encounter for surveillance of injectable contraceptive: Secondary | ICD-10-CM

## 2021-11-16 MED ORDER — MEDROXYPROGESTERONE ACETATE 150 MG/ML IM SUSP
150.0000 mg | Freq: Once | INTRAMUSCULAR | Status: AC
  Administered 2021-11-16: 150 mg via INTRAMUSCULAR

## 2021-11-22 ENCOUNTER — Telehealth: Payer: Self-pay | Admitting: Internal Medicine

## 2021-11-22 NOTE — Telephone Encounter (Signed)
Pt called to reschedule an appt in 1-10, but no one called her back. So she is calling again to reschedule. Call back at [phone]

## 2021-11-22 NOTE — Telephone Encounter (Signed)
Attempted to contact pt. No answer, left VM to call back or send message via MyChart.

## 2021-11-26 ENCOUNTER — Telehealth: Payer: Self-pay | Admitting: Internal Medicine

## 2021-11-26 NOTE — Telephone Encounter (Signed)
[facility] called about a concerning allergy on medication received

## 2021-11-27 NOTE — Telephone Encounter (Signed)
[facility] called again about fluticasone (FLONASE) 50 MCG/ACT nasal spray, patient has a allergy to prednisone. Does Dr Derrel Nip still want this filled?

## 2021-11-27 NOTE — Telephone Encounter (Signed)
Spoke with pharmacist at [facility] to let him know that it is okay to fill the flonase nasal spray.

## 2021-11-28 ENCOUNTER — Telehealth: Payer: Self-pay | Admitting: Internal Medicine

## 2021-11-28 NOTE — Telephone Encounter (Signed)
Access nursed called stating pt will go to ER after work today but pt would like to see her primary doctor

## 2021-11-28 NOTE — Telephone Encounter (Signed)
Spoke with pt and advised that she should definitely be seen at the ED today and then schedule a follow up with you later. Pt stated that she will try to go to the ED when she gets off work. Pt has been scheduled for a follow up with you 12/07/2021.

## 2021-11-28 NOTE — Telephone Encounter (Signed)
Pt called in stating that she is having panic attacks at work that last 30 minutes or longer, her anxiety is high and her heart rate is high throughout the day 139,[phone] sent to access nurse

## 2021-11-30 ENCOUNTER — Emergency Department
Admission: EM | Admit: 2021-11-30 | Discharge: 2021-11-30 | Disposition: A | Discharge status: Home / Self Care | Payer: Commercial Managed Care - PPO | Attending: Emergency Medicine | Admitting: Emergency Medicine

## 2021-11-30 ENCOUNTER — Emergency Department: Payer: Commercial Managed Care - PPO

## 2021-11-30 DIAGNOSIS — R002 Palpitations: Secondary | ICD-10-CM | POA: Diagnosis present

## 2021-11-30 DIAGNOSIS — R000 Tachycardia, unspecified: Secondary | ICD-10-CM | POA: Diagnosis not present

## 2021-11-30 DIAGNOSIS — R109 Unspecified abdominal pain: Secondary | ICD-10-CM | POA: Insufficient documentation

## 2021-11-30 LAB — CBC
HCT: 42 % (ref 36.0–46.0)
Hemoglobin: 13.8 g/dL (ref 12.0–15.0)
MCH: 31.2 pg (ref 26.0–34.0)
MCHC: 32.9 g/dL (ref 30.0–36.0)
MCV: 95 fL (ref 80.0–100.0)
Platelets: 159 10*3/uL (ref 150–400)
RBC: 4.42 MIL/uL (ref 3.87–5.11)
RDW: 12.5 % (ref 11.5–15.5)
WBC: 3.3 10*3/uL — ABNORMAL LOW (ref 4.0–10.5)
nRBC: 0 % (ref 0.0–0.2)

## 2021-11-30 LAB — BASIC METABOLIC PANEL
Anion gap: 8 (ref 5–15)
BUN: 10 mg/dL (ref 6–20)
CO2: 21 mmol/L — ABNORMAL LOW (ref 22–32)
Calcium: 8.5 mg/dL — ABNORMAL LOW (ref 8.9–10.3)
Chloride: 107 mmol/L (ref 98–111)
Creatinine, Ser: 0.64 mg/dL (ref 0.44–1.00)
GFR, Estimated: >60 mL/min (ref >60)
Glucose, Bld: 100 mg/dL — ABNORMAL HIGH (ref 70–99)
Potassium: 3.6 mmol/L (ref 3.5–5.1)
Sodium: 136 mmol/L (ref 135–145)

## 2021-11-30 LAB — URINALYSIS, COMPLETE (UACMP) WITH MICROSCOPIC
Bacteria, UA: NONE SEEN
Bilirubin Urine: NEGATIVE
Glucose, UA: NEGATIVE mg/dL
Ketones, ur: NEGATIVE mg/dL
Leukocytes,Ua: NEGATIVE
Nitrite: NEGATIVE
Protein, ur: NEGATIVE mg/dL
Specific Gravity, Urine: 1.008 (ref 1.005–1.030)
WBC, UA: NONE SEEN WBC/hpf (ref 0–5)
pH: 6 (ref 5.0–8.0)

## 2021-11-30 LAB — TROPONIN I (HIGH SENSITIVITY)
Troponin I (High Sensitivity): <2 ng/L (ref <18)
Troponin I (High Sensitivity): 3 ng/L (ref <18)

## 2021-11-30 LAB — T4, FREE: Free T4: 1.06 ng/dL (ref 0.61–1.12)

## 2021-11-30 LAB — PREGNANCY, URINE: Preg Test, Ur: NEGATIVE

## 2021-11-30 LAB — TSH: TSH: 0.956 u[IU]/mL (ref 0.350–4.500)

## 2021-11-30 NOTE — Discharge Instructions (Signed)
I spoke with Dr. Rockie Neighbours.  He wants to leave you on the Bystolic 5 for now.  I have also texted Dr. Rockey Situ and he should get with you on Monday and arrange rapid follow-up.  Please return here if you have any shortness of breath or chest pain or if your back pain gets worse or you have any other problems

## 2021-11-30 NOTE — ED Provider Notes (Signed)
[facility] Provider Note    Event Date/Time   First MD Initiated Contact with Patient 11/30/21 1938     (approximate)   History   Tachycardia and Back Pain   HPI  Molly Stone is a 41 y.o. female who comes in complaining of bilateral flank pain today.  The pain is low in the back comes on both sides sharp stabbing lasted a few minutes and improves.  Never really goes away completely.  Is not really made better or worse by moving or bending sometimes breathing makes it seem a little bit worse.  There is no pain in the chest.  She is not short of breath.  She also complains of intermittent episodes of tachycardia.  She has been seeing Dr. Rockey Situ for this.  He had put her on Bystolic 10 mg but fairly recently cut it back to 5 because she was doing well.  Her Apple Watch says her heart rates been running from 46-1 66.  Here she has been running about 80 or 90.      Physical Exam   Triage Vital Signs: ED Triage Vitals  Enc Vitals Group     BP 11/30/21 1627 121/81     Pulse Rate 11/30/21 1627 97     Resp 11/30/21 1627 18     Temp 11/30/21 1627 99.4 F (37.4 C)     Temp Source 11/30/21 1627 Oral     SpO2 11/30/21 1627 100 %     Weight 11/30/21 1629 240 lb (108.9 kg)     Height 11/30/21 1629 5\' 9"  (1.753 m)     Head Circumference --      Peak Flow --      Pain Score 11/30/21 1628 5     Pain Loc --      Pain Edu? --      Excl. in [place]? --     Most recent vital signs: Vitals:   11/30/21 2030 11/30/21 2100  BP: 127/64 118/70  Pulse: 85 88  Resp: (!) 23 19  Temp:    SpO2: 98% 100%     General: Awake, alert, no distress.  CV:  Good peripheral perfusion.  Heart regular rate and rhythm no audible murmurs Resp:  Normal effort. Lungs are clear Abd:  No distention.  Back is supple nontender to palpation or percussion over the spine or the CVA areas.  Abdomen is soft bowel sounds are positive there is no tenderness to percussion even deep palpation  is negative. Extremities with no edema   ED Results / Procedures / Treatments   Labs (all labs ordered are listed, but only abnormal results are displayed) Labs Reviewed  BASIC METABOLIC PANEL - Abnormal; Notable for the following components:      Result Value   CO2 21 (*)    Glucose, Bld 100 (*)    Calcium 8.5 (*)    All other components within normal limits  CBC - Abnormal; Notable for the following components:   WBC 3.3 (*)    All other components within normal limits  URINALYSIS, COMPLETE (UACMP) WITH MICROSCOPIC - Abnormal; Notable for the following components:   Color, Urine YELLOW (*)    APPearance CLEAR (*)    Hgb urine dipstick SMALL (*)    All other components within normal limits  TSH  T4, FREE  PREGNANCY, URINE  TROPONIN I (HIGH SENSITIVITY)  TROPONIN I (HIGH SENSITIVITY)     EKG  EKG read interpreted by me  shows normal sinus rhythm rate of 99 normal axis nonspecific ST-T wave changes Of note patient has an Apple watch which records her heart rate has been between 46 and 166 today.  RADIOLOGY Chest x-ray read by radiology reviewed by me is negative   PROCEDURES:  Critical Care performed:   Procedures   MEDICATIONS ORDERED IN ED: Medications - No data to display   IMPRESSION / MDM / [place] / ED COURSE  I reviewed the triage vital signs and the nursing notes. ----------------------------------------- 9:04 PM on 11/30/2021 ----------------------------------------- I am going to sign the patient out to the B side doctor at this point.  I have called Dr. Aundra Dubin who is on-call for [facility] but he has not answered yet.  I plan to ask him about the Bystolic.  Also we have to check on the CT renal that I ordered to check on her history of kidney stones and sharp stabbing intermittent back pain which is low in her back.  Differential diagnosis includes, but is not limited to, renal colic, muscle spasm, slipped disc which is  much less likely by history, aortic aneurysm which is very unlikely, other causes of back pain.  The patient is on the cardiac monitor to evaluate for evidence of arrhythmia and/or significant heart rate changes. ----------------------------------------- 9:24 PM on 11/30/2021 ----------------------------------------- I have signed the patient out to the physicians and the PAs all for them.  They will keep an eye on the patient and discharged her or admit her as need be.  CT is pending everything else is negative.     FINAL CLINICAL IMPRESSION(S) / ED DIAGNOSES   Final diagnoses:  Palpitations     Rx / DC Orders   ED Discharge Orders     None        Note:  This document was prepared using Dragon voice recognition software and may include unintentional dictation errors.   Nena Polio, MD 11/30/21 2125

## 2021-11-30 NOTE — ED Notes (Signed)
Called lab to add on urine preg  

## 2021-11-30 NOTE — ED Notes (Signed)
First Nurse Note:  Pt to ED via POV c/o lower back pain and elevated heart rate. Pt is in NAD.

## 2021-11-30 NOTE — ED Triage Notes (Signed)
Pt to ED via [facility] from home. Pt states PCP wanted her evaluated for her high HR. Pt states highest on watch was 139. Pt with hx SVT. Pt follows up with Dr. Rockey Situ. Pt also reports bilateral low back pain that started this morning. Pt states decreased urine output but denies burning. Pt states had CP yesterday but not currently. Pt with hx kidney stones.

## 2021-12-06 ENCOUNTER — Encounter: Payer: Self-pay | Admitting: Nurse Practitioner

## 2021-12-06 ENCOUNTER — Inpatient Hospital Stay: Payer: Commercial Managed Care - PPO | Attending: Nurse Practitioner

## 2021-12-06 ENCOUNTER — Inpatient Hospital Stay (HOSPITAL_BASED_OUTPATIENT_CLINIC_OR_DEPARTMENT_OTHER): Payer: Commercial Managed Care - PPO | Admitting: Nurse Practitioner

## 2021-12-06 ENCOUNTER — Other Ambulatory Visit: Payer: Self-pay

## 2021-12-06 VITALS — BP 118/76 | HR 50 | Temp 98.7°F | Resp 16 | Wt 254.0 lb

## 2021-12-06 DIAGNOSIS — D709 Neutropenia, unspecified: Secondary | ICD-10-CM

## 2021-12-06 DIAGNOSIS — D708 Other neutropenia: Secondary | ICD-10-CM

## 2021-12-06 DIAGNOSIS — F17210 Nicotine dependence, cigarettes, uncomplicated: Secondary | ICD-10-CM | POA: Diagnosis not present

## 2021-12-06 LAB — CBC WITH DIFFERENTIAL/PLATELET
Abs Immature Granulocytes: 0 10*3/uL (ref 0.00–0.07)
Basophils Absolute: 0 10*3/uL (ref 0.0–0.1)
Basophils Relative: 1 %
Eosinophils Absolute: 0.2 10*3/uL (ref 0.0–0.5)
Eosinophils Relative: 7 %
HCT: 41.6 % (ref 36.0–46.0)
Hemoglobin: 14.1 g/dL (ref 12.0–15.0)
Immature Granulocytes: 0 %
Lymphocytes Relative: 52 %
Lymphs Abs: 1.7 10*3/uL (ref 0.7–4.0)
MCH: 31.8 pg (ref 26.0–34.0)
MCHC: 33.9 g/dL (ref 30.0–36.0)
MCV: 93.7 fL (ref 80.0–100.0)
Monocytes Absolute: 0.6 10*3/uL (ref 0.1–1.0)
Monocytes Relative: 17 %
Neutro Abs: 0.8 10*3/uL — ABNORMAL LOW (ref 1.7–7.7)
Neutrophils Relative %: 23 %
Platelets: 188 10*3/uL (ref 150–400)
RBC: 4.44 MIL/uL (ref 3.87–5.11)
RDW: 12.2 % (ref 11.5–15.5)
WBC: 3.4 10*3/uL — ABNORMAL LOW (ref 4.0–10.5)
nRBC: 0 % (ref 0.0–0.2)

## 2021-12-06 NOTE — Progress Notes (Signed)
Hematology/Oncology Consult Note [facility]  [phone] [phone]  Patient Care Team: Crecencio Mc, MD as PCP - General (Internal Medicine) Sindy Guadeloupe, MD as Consulting Physician (Hematology and Oncology)    Name of the patient: Molly Stone  [id number]  Aug 20, 1981    Date of visit: 10/03/20   Diagnosis-chronic idiopathic neutropenia   Chief complaint/ Reason for visit-routine follow-up of neutropenia  History of present illness: patient is a 41 year old female with a past medical history significant for spinal stenosis, obstructive sleep apnea referred for leukopenia/neutropenia.She has been feeling poorly for the last 1 month and reports having significant fatigue.  Reports occasional chest pain which lasts for a few minutes and then resolves on its own.  Denies any fever.  Appetite and weight have remained stable.  Denies any history of autoimmune disorders.  Looking back at her prior CBCs. Patient has had a normal white cell count with a normal differential up until 2019 when she was noted to have leukopenia for the first time in November 2019.  On 09/11/2018 her white count was 3.1 with an [place] of 1.  Following that her white cell count normalized but was again found to be low in June 2020 when her white count was 3.2 with an ANC of 0.9.  ANC improved to 1.3 in September 2020 but since then her [place] has been trending down to 800 in April 2021 and 700 on 03/09/2020.  Hemoglobin is always remained stable between 13-14 and platelets have been normal.  She had some initial work-up done by Dr. Derrel Nip and was found to have borderline low B12 levels of 287.  Copper levels and folate was normal.  Iron studies were within normal limits.  She also had HIV testing and hep B and hep C testing which was all negative.   Results of blood work from 03/27/2020 were as follows: CBC showed white count of 2.4, H&H of 14.1/41.7 and a platelet count of 191.  ANC was  low at 500.  Flow cytometry was unable to evaluate B-cell for allergy due to nonspecific light chain binding.  ANA comprehensive panel was negative.  Pathology smear review showed relative neutropenia.  Normal erythrocytes and normal platelets.   Bone marrow biopsy showed normocellular marrow with trilineage hematopoiesis including abundant granulocytic cells and mature neutrophils with nonspecific changes.  No increase in blastic cells.  No evidence of lymphoproliferative disorder.  In the setting peripheral neutropenia may be related to medication immune mediated process flow cytometry on the marrow was normal.    Interval history Patient returns to clinic for follow up of chronic neutropenia. She had labs performed today. Continues to report fatigue and low exercise tolerance. Has been intentionally losing weight. Reports increased stress at home recently. Has seen rheumatology, Dr. Gaylyn Cheers who diagnosed her with SLE/possible connective tissue disorder and plaquenil was recommended. She also saw Dr. Amil Amen who by her report, did not recommend plaquenil.    Review of Systems  Constitutional:  Positive for malaise/fatigue. Negative for chills, fever and weight loss.  HENT:  Negative for congestion, ear discharge and nosebleeds.   Eyes:  Negative for blurred vision.  Respiratory:  Negative for cough, hemoptysis, sputum production, shortness of breath and wheezing.   Cardiovascular:  Negative for chest pain, palpitations, orthopnea and claudication.  Gastrointestinal:  Negative for abdominal pain, blood in stool, constipation, diarrhea, heartburn, melena, nausea and vomiting.  Genitourinary:  Negative for dysuria, flank pain, frequency, hematuria and urgency.  Musculoskeletal:  Negative for back pain, joint pain and myalgias.  Skin:  Negative for rash.  Neurological:  Negative for dizziness, tingling, focal weakness, seizures, weakness and headaches.  Endo/Heme/Allergies:  Does not bruise/bleed easily.   Psychiatric/Behavioral:  Negative for depression and suicidal ideas. The patient does not have insomnia.     Allergies  Allergen Reactions   Buprenorphine Hcl Itching and Other (See Comments)    And shaking   Morphine And Related Itching and Other (See Comments)    And shaking   Prednisone Anxiety and Palpitations   Tramadol Hcl Other (See Comments)    anxious    Past Medical History:  Diagnosis Date   Allergy    Anemia    Anxiety    Asthma    Chlamydia    h/o   Concussion without loss of consciousness 08/04/2016   DDD (degenerative disc disease), cervical 04/07/2019   Disorder of vocal cord    spasmodic dysphonia   Dysmenorrhea    Dyspareunia, female    Endometriosis    Family history of endometriosis    GERD (gastroesophageal reflux disease)    Headache    migraine   Heavy periods    Hiatal hernia    History of nephrolithiasis    Hypertension    Jaundice, physiologic, newborn    Lupus (HCC)    Obesity (BMI 30-39.9)    Sleep apnea    Tobacco user    Vaginal Pap smear, abnormal     Past Surgical History:  Procedure Laterality Date   BONE MARROW BIOPSY     DIAGNOSTIC LAPAROSCOPY     DILATATION & CURETTAGE/HYSTEROSCOPY WITH MYOSURE N/A 08/27/2018   Procedure: DILATATION & CURETTAGE/HYSTEROSCOPY WITH MINERVA;  Surgeon: Gae Dry, MD;  Location: [facility] ORS;  Service: Gynecology;  Laterality: N/A;   DILATION AND CURETTAGE OF UTERUS     ESOPHAGOGASTRODUODENOSCOPY (EGD) WITH PROPOFOL N/A 08/07/2018   Procedure: ESOPHAGOGASTRODUODENOSCOPY (EGD) WITH PROPOFOL;  Surgeon: Jonathon Bellows, MD;  Location: [facility] ENDOSCOPY;  Service: Gastroenterology;  Laterality: N/A;   ESOPHAGOGASTRODUODENOSCOPY (EGD) WITH PROPOFOL N/A 07/20/2021   Procedure: ESOPHAGOGASTRODUODENOSCOPY (EGD) WITH PROPOFOL;  Surgeon: Jonathon Bellows, MD;  Location: [facility] ENDOSCOPY;  Service: Gastroenterology;  Laterality: N/A;   laparoscopy     ROOT CANAL     WISDOM TOOTH EXTRACTION      Social History    Socioeconomic History   Marital status: Married    Spouse name: Not on file   Number of children: Not on file   Years of education: Not on file   Highest education level: Not on file  Occupational History   Not on file  Tobacco Use   Smoking status: Every Day    Packs/day: 0.50    Years: 10.00    Pack years: 5.00    Types: Cigarettes   Smokeless tobacco: Never  Vaping Use   Vaping Use: Former  Substance and Sexual Activity   Alcohol use: Yes    Alcohol/week: 0.0 standard drinks    Comment: rarely   Drug use: No   Sexual activity: Yes    Birth control/protection: None    Comment: vasectomy  Other Topics Concern   Not on file  Social History Narrative   Not on file   Social Determinants of Health   Financial Resource Strain: Not on file  Food Insecurity: Not on file  Transportation Needs: Not on file  Physical Activity: Not on file  Stress: Not on file  Social Connections: Not on file  Intimate Partner Violence:  Not on file    Family History  Problem Relation Age of Onset   Endometriosis Mother    Allergic rhinitis Mother    Cancer Father        benign liver CA   Stroke Father        recurrent blood clots on the brain    Lupus Father    Cancer Maternal Grandmother 60       ovarian Ca, still surviving    Osteoporosis Maternal Grandmother    Ovarian cancer Maternal Grandmother    Heart disease Maternal Grandfather    Cancer Maternal Grandfather 71       colon CA. metastatic   Diabetes Maternal Uncle    Heart disease Paternal Grandmother    CAD Brother 52       early at age 51   Immunodeficiency Brother    Breast cancer Cousin    Colon cancer Neg Hx    Kidney cancer Neg Hx    Bladder Cancer Neg Hx    Angioedema Neg Hx    Atopy Neg Hx    Asthma Neg Hx    Eczema Neg Hx    Urticaria Neg Hx     Current Outpatient Medications:    ADVAIR HFA 230-21 MCG/ACT inhaler, USE 2 INHALATIONS TWICE A DAY, Disp: 36 g, Rfl: 3   albuterol (VENTOLIN HFA) 108 (90  Base) MCG/ACT inhaler, USE 2 INHALATIONS EVERY 6 HOURS AS NEEDED FOR WHEEZING OR SHORTNESS OF BREATH, Disp: 8.5 g, Rfl: 1   ALPRAZolam (XANAX) 0.25 MG tablet, TAKE 1 TABLET BY MOUTH AS NEEDED FOR PANIC ATTACKS (Patient not taking: Reported on 07/20/2021), Disp: 30 tablet, Rfl: 5   amoxicillin-clavulanate (AUGMENTIN) 875-125 MG tablet, Take 1 tablet by mouth 2 (two) times daily., Disp: 20 tablet, Rfl: 0   Azelastine HCl 137 MCG/SPRAY SOLN, USE 1 TO 2 SPRAYS IN EACH NOSTRIL TWICE A DAY AS NEEDED FOR RHINITIS, NASAL DRAINAGE AS DIRECTED, Disp: 30 mL, Rfl: 0   Carbinoxamine Maleate (RYVENT) 6 MG TABS, Take 1 tablet by mouth 3 (three) times daily as needed (allergies)., Disp: 120 tablet, Rfl: 11   Cholecalciferol (VITAMIN D3) 125 MCG (5000 UT) TABS, Take 1 tablet by mouth daily., Disp: , Rfl:    clindamycin-tretinoin (ZIANA) gel, Apply 1 application topically daily. (Patient not taking: Reported on 08/21/2021), Disp: , Rfl:    EPINEPHrine 0.3 mg/0.3 mL IJ SOAJ injection, See admin instructions. for allergic reaction, Disp: , Rfl:    fluconazole (DIFLUCAN) 150 MG tablet, Take 1 tablet (150 mg total) by mouth daily. Repeat in 3 days, Disp: 2 tablet, Rfl: 0   fluticasone (FLONASE) 50 MCG/ACT nasal spray, USE 1 SPRAY IN EACH NOSTRIL TWICE A DAY AS NEEDED FOR ALLERGIES OR RHINITIS (NASAL CONGESTION), Disp: 48 g, Rfl: 3   hydrocortisone 2.5 % cream, Apply topically. (Patient not taking: Reported on 08/21/2021), Disp: , Rfl:    medroxyPROGESTERone (DEPO-PROVERA) 150 MG/ML injection, Inject 1 mL (150 mg total) into the muscle every 3 (three) months., Disp: 1 mL, Rfl: 3   metFORMIN (GLUCOPHAGE-XR) 500 MG 24 hr tablet, Take by mouth. (Patient not taking: Reported on 08/21/2021), Disp: , Rfl:    methylPREDNISolone (MEDROL DOSEPAK) 4 MG TBPK tablet, As needed, Disp: 21 tablet, Rfl: 0   montelukast (SINGULAIR) 10 MG tablet, TAKE 1 TABLET AT BEDTIME, Disp: 90 tablet, Rfl: 3   nebivolol (BYSTOLIC) 5 MG tablet, Take 1 tablet  (5 mg total) by mouth daily., Disp: 90 tablet, Rfl: 3  pantoprazole (PROTONIX) 40 MG tablet, TAKE 1 TABLET TWICE A DAY, Disp: 180 tablet, Rfl: 1   propranolol (INDERAL) 10 MG tablet, Take 1 tablet (10 mg total) by mouth 3 (three) times daily. Take only as needed for tachycardia (Patient not taking: Reported on 08/21/2021), Disp: 60 tablet, Rfl: 1   Semaglutide,0.25 or 0.5MG/DOS, (OZEMPIC, 0.25 OR 0.5 MG/DOSE,) 2 MG/1.5ML SOPN, Inject 0.5 mg into the skin once a week., Disp: , Rfl:    Tiotropium Bromide Monohydrate (SPIRIVA RESPIMAT) 1.25 MCG/ACT AERS, Inhale 2 puffs into the lungs daily. (Patient not taking: Reported on 08/21/2021), Disp: 4 g, Rfl: 5  DG Chest 2 View  Result Date: 11/30/2021 CLINICAL DATA:  Tachycardia. EXAM: CHEST - 2 VIEW COMPARISON:  June 02, 2021. FINDINGS: The heart size and mediastinal contours are within normal limits. Both lungs are clear. The visualized skeletal structures are unremarkable. IMPRESSION: No active cardiopulmonary disease. Electronically Signed   By: Marijo Conception M.D.   On: 11/30/2021 16:46   CT Renal Stone Study  Result Date: 11/30/2021 CLINICAL DATA:  Flank pain.  Kidney stone suspected. EXAM: CT ABDOMEN AND PELVIS WITHOUT CONTRAST TECHNIQUE: Multidetector CT imaging of the abdomen and pelvis was performed following the standard protocol without IV contrast. RADIATION DOSE REDUCTION: This exam was performed according to the departmental dose-optimization program which includes automated exposure control, adjustment of the mA and/or kV according to patient size and/or use of iterative reconstruction technique. COMPARISON:  CT with contrast 09/17/2018 FINDINGS: Lower chest: No acute abnormality. Mild asymmetric elevation right hemidiaphragm. Hepatobiliary: No focal liver abnormality is seen without contrast. No gallstones, gallbladder wall thickening, or biliary dilatation. Pancreas: Unremarkable without contrast. Spleen: Unremarkable without contrast. Splenule  again noted the lower medial aspect. Adrenals/Urinary Tract: There is no adrenal mass, no focal abnormality in the unenhanced renal cortex. Solitary punctate nonobstructive caliceal stone inferior pole left kidney. There is no hydronephrosis or ureteral stone. The bladder thickness is normal. There is no stone in the bladder. Stomach/Bowel: No dilatation or wall thickening, including the appendix. There are colonic diverticula without evidence of focal colitis or diverticulitis. Vascular/Lymphatic: No significant vascular findings are present. No enlarged abdominal or pelvic lymph nodes. Reproductive: Uterus and bilateral adnexa are unremarkable. Other: Small umbilical fat hernia. 3.2 cm lipoma again noted in the left iliopsoas muscle. There is no free air, hemorrhage or fluid Musculoskeletal: Small left paracentral L5-S1 disc protrusion not seen previously. No worrisome regional skeletal lesions. IMPRESSION: 1. No acute noncontrast CT abnormality. 2. Small left paracentral L5-S1 disc herniation not seen previously. Is the patient's flank pain on the left? 3. Solitary punctate nonobstructive caliceal stone inferior pole left kidney. No hydronephrosis or ureteral stone. 4. Diverticulosis without evidence of diverticulitis. 5. Umbilical fat hernia. Electronically Signed   By: Telford Nab M.D.   On: 11/30/2021 22:22     Observation/objective: Today's Vitals   12/06/21 1440 12/06/21 1445  BP:  118/76  Pulse:  (!) 50  Resp:  16  Temp:  98.7 F (37.1 C)  TempSrc:  Tympanic  SpO2:  95%  Weight:  254 lb (115.2 kg)  PainSc: 4     There is no height or weight on file to calculate BMI.  Physical Exam Constitutional:      Appearance: She is not ill-appearing.  Pulmonary:     Effort: No respiratory distress.  Skin:    General: Skin is dry.     Coloration: Skin is not pale.  Neurological:     Mental Status:  She is alert and oriented to person, place, and time.  Psychiatric:        Mood and Affect:  Mood normal.        Behavior: Behavior normal.    CMP Latest Ref Rng & Units 11/30/2021  Glucose 70 - 99 mg/dL 100(H)  BUN 6 - 20 mg/dL 10  Creatinine 0.44 - 1.00 mg/dL 0.64  Sodium 135 - 145 mmol/L 136  Potassium 3.5 - 5.1 mmol/L 3.6  Chloride 98 - 111 mmol/L 107  CO2 22 - 32 mmol/L 21(L)  Calcium 8.9 - 10.3 mg/dL 8.5(L)  Total Protein 6.5 - 8.1 g/dL -  Total Bilirubin 0.3 - 1.2 mg/dL -  Alkaline Phos 38 - 126 U/L -  AST 15 - 41 U/L -  ALT 0 - 44 U/L -   CBC Latest Ref Rng & Units 12/06/2021  WBC 4.0 - 10.5 K/uL 3.4(L)  Hemoglobin 12.0 - 15.0 g/dL 14.1  Hematocrit 36.0 - 46.0 % 41.6  Platelets 150 - 400 K/uL 188   Iron/TIBC/Ferritin/ %Sat    Component Value Date/Time   IRON 74 07/10/2020 1008   TIBC 309 07/10/2020 1008   FERRITIN 37 07/10/2020 1008   IRONPCTSAT 24 07/10/2020 1008   IRONPCTSAT 27 03/13/2020 1446     Assessment and plan: Patient is a 41 year old female with chronic mild to moderate isolated neutropenia here for routine follow-up  Autoimmune neutropenia: Patient has had an entire work-up for neutropenia including a bone marrow biopsy which did not show any discernible etiology.  Working diagnosis for her neutropenia is possibly autoimmune and could be connected to her underlying undifferentiated connective tissue disorder as well.  She was evaluated by Dr. Gaylyn Cheers at [facility] who recommended that she should be started on Plaquenil. Patient says that this was not recommended by another rheumatologist that she sees. Neutropenia continues to wax and wane but has not caused repeated infections therefore, would not recommend neupogen at this time.   Follow-up instructions: CBC with differential in 3 and follow up with Dr. Janese Banks  I discussed the assessment and treatment plan with the patient. The patient was provided an opportunity to ask questions and all were answered. The patient agreed with the plan and demonstrated an understanding of the instructions.   The patient was  advised to call back or seek an in-person evaluation if the symptoms worsen or if the condition fails to improve as anticipated.  Visit Diagnosis: 1. Chronic neutropenia ([place])    Beckey Rutter, DNP, AGNP-C [place] at [facility] [phone] (clinic) 12/06/2021

## 2021-12-06 NOTE — Progress Notes (Signed)
Pt in for follow up, reports she is very tired and fatigue.  No energy levels.  Pt also reports is taking Ozempic now for weight loss.

## 2021-12-07 ENCOUNTER — Encounter: Payer: Self-pay | Admitting: Internal Medicine

## 2021-12-07 ENCOUNTER — Telehealth (INDEPENDENT_AMBULATORY_CARE_PROVIDER_SITE_OTHER): Payer: Commercial Managed Care - PPO | Admitting: Internal Medicine

## 2021-12-07 DIAGNOSIS — F320 Major depressive disorder, single episode, mild: Secondary | ICD-10-CM | POA: Diagnosis not present

## 2021-12-07 DIAGNOSIS — K3184 Gastroparesis: Secondary | ICD-10-CM | POA: Diagnosis not present

## 2021-12-07 MED ORDER — MONTELUKAST SODIUM 10 MG PO TABS: 10.0000 mg | ORAL_TABLET | Freq: Every day | ORAL | 1 refills | Status: DC

## 2021-12-07 MED ORDER — ALPRAZOLAM 0.25 MG PO TABS: ORAL_TABLET | ORAL | 0 refills | Status: AC

## 2021-12-07 MED ORDER — SERTRALINE HCL 50 MG PO TABS: 50.0000 mg | ORAL_TABLET | Freq: Every day | ORAL | 1 refills | Status: DC

## 2021-12-07 MED ORDER — BUPROPION HCL ER (XL) 150 MG PO TB24: 150.0000 mg | ORAL_TABLET | Freq: Every day | ORAL | 1 refills | Status: DC
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
## 2021-12-07 NOTE — Patient Instructions (Signed)
Please start the  Sertraline (generic for Zoloft) at 1/2 tablet daily in the morning with breakfast for the first week to avoid nausea.  You can increase to a full tablet after 14-5 days  if you havenot developed side effects of nausea.    You should start to feel a difference after two weeks on the full dose.  After 2 weeks you can add wellbutrin once daily IN THE MORNING.  This is an antidepressant that has stimulating effects on the brain.  CONTINUE THE SERTRALINE WITH THE WELLBUTRIN    PLEASE CONTACT ONE OF THE COUNSELLORS ON THE LIST .  I PERSONALLY RECOMMEND TINA THOMPSON

## 2021-12-07 NOTE — Assessment & Plan Note (Addendum)
Secondary to divorce,  Impending loss of home and financial stress.  She cites fatigue, difficulty concentrating and anxiety. Adding wellbutrin once anxiety is better controlled  with sertraline

## 2021-12-07 NOTE — Assessment & Plan Note (Signed)
Suggested by the presence of food particles found in stomach during prescreening EGD

## 2021-12-13 ENCOUNTER — Telehealth: Payer: Self-pay | Admitting: Internal Medicine

## 2021-12-13 ENCOUNTER — Ambulatory Visit
Admission: EM | Admit: 2021-12-13 | Discharge: 2021-12-13 | Disposition: A | Discharge status: Home / Self Care | Payer: Commercial Managed Care - PPO | Attending: Emergency Medicine | Admitting: Emergency Medicine

## 2021-12-13 ENCOUNTER — Encounter: Payer: Self-pay | Admitting: Emergency Medicine

## 2021-12-13 ENCOUNTER — Other Ambulatory Visit: Payer: Self-pay

## 2021-12-13 DIAGNOSIS — Z1152 Encounter for screening for COVID-19: Secondary | ICD-10-CM | POA: Diagnosis not present

## 2021-12-13 DIAGNOSIS — R051 Acute cough: Secondary | ICD-10-CM

## 2021-12-13 NOTE — Telephone Encounter (Addendum)
Pt called in stating she was exposed to covid at work and want to know next steps. Pt stated she has a low immune system. Pt want to know when does she test herself because this is the first time this has happen to her. Pt does not have any symptoms but allergies. I told the pt I was not clinical and I did not have all the answers but I will send a message back to the provider.

## 2021-12-13 NOTE — Telephone Encounter (Signed)
Pt called and stated she need a note for her employer stating what we said about her following the CDC guidelines and if she would like to test herself she has that option to do so. Pt stated that she need the note today because her company is stupid. I transferred the call to the manager for her to talk to.

## 2021-12-13 NOTE — ED Triage Notes (Signed)
Pt here with known exposure to COVID at work and now presents with nasal congestion, sore throat, headache, and cough x 2 days.

## 2021-12-13 NOTE — Discharge Instructions (Signed)
Take Zyrtec and Flonase over-the-counter. Monitor MyChart results for COVID-19 testing results.

## 2021-12-13 NOTE — ED Provider Notes (Signed)
[facility]  ____________________________________________  Time seen: Approximately 12:58 PM  I have reviewed the triage vital signs and the nursing notes.   HISTORY  Chief Complaint Nasal Congestion, Sore Throat, Cough, and Headache   Historian Patient     HPI Molly Stone is a 41 y.o. female presents to the urgent care after being exposed to [place] at work.  She had some mild nasal congestion and rhinorrhea and a mild sore throat for the past 2 days.  No chest pain, chest tightness or abdominal pain.   Past Medical History:  Diagnosis Date   Allergy    Anemia    Anxiety    Asthma    Chlamydia    h/o   Concussion without loss of consciousness 08/04/2016   DDD (degenerative disc disease), cervical 04/07/2019   Disorder of vocal cord    spasmodic dysphonia   Dysmenorrhea    Dyspareunia, female    Endometriosis    Family history of endometriosis    GERD (gastroesophageal reflux disease)    Headache    migraine   Heavy periods    Hiatal hernia    History of nephrolithiasis    Hypertension    Jaundice, physiologic, newborn    Lupus ([place])    Obesity (BMI 30-39.9)    Sleep apnea    Tobacco user    Vaginal Pap smear, abnormal      Immunizations up to date:  Yes.     Past Medical History:  Diagnosis Date   Allergy    Anemia    Anxiety    Asthma    Chlamydia    h/o   Concussion without loss of consciousness 08/04/2016   DDD (degenerative disc disease), cervical 04/07/2019   Disorder of vocal cord    spasmodic dysphonia   Dysmenorrhea    Dyspareunia, female    Endometriosis    Family history of endometriosis    GERD (gastroesophageal reflux disease)    Headache    migraine   Heavy periods    Hiatal hernia    History of nephrolithiasis    Hypertension    Jaundice, physiologic, newborn    Lupus ([place])    Obesity (BMI 30-39.9)    Sleep apnea    Tobacco user    Vaginal Pap smear, abnormal     Patient Active Problem List    Diagnosis Date Noted   Gastroparesis 12/07/2021   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   Other specified disorders involving the immune mechanism, not elsewhere classified ([place]) 07/16/2021   Polyarthralgia 07/16/2021   Left corneal abrasion 06/20/2021   Bronchitis 06/14/2021   Depression, major, single episode, mild ([place]) 06/14/2021   Asthma exacerbation 04/13/2021   Cough 03/26/2021   Immunization reaction 01/02/2021   Seasonal and perennial allergic rhinoconjunctivitis 01/02/2021   SLE (systemic lupus erythematosus related syndrome) ([place]) 11/09/2020   Drug reaction 09/06/2020   History of frequent upper respiratory infection 09/06/2020   History of atopic dermatitis 09/06/2020   Moderate persistent asthma without complication 63/84/5364   Rash and other nonspecific skin eruption 09/06/2020   Adverse food reaction 09/06/2020   Non-cardiac chest pain 03/30/2020   B12 deficiency 03/17/2020   Vasomotor flushing 02/07/2020   Osteoarthritis of knee 09/08/2019   Neutropenia ([place]) 07/12/2019   DDD (degenerative disc disease), cervical 04/07/2019   Hyperlipidemia 04/07/2019   Abnormal MRI, cervical spine 04/07/2019   Right calf pain 03/24/2019   Paroxysmal tachycardia (HCC) 01/28/2019   Chronic bilateral thoracic back pain 03/07/2017  Spinal stenosis 03/05/2017   OSA (obstructive sleep apnea) 07/24/2016   Generalized anxiety disorder 10/25/2015   Family history of endometriosis 10/12/2015   Dysmenorrhea 10/12/2015   H/O renal calculi 08/15/2015   Morbid obesity ([place]) 08/15/2015   Encounter for preventive health examination 06/16/2014   Sciatica 04/01/2014   Other allergic rhinitis 04/12/2012   Vitamin D deficiency 04/12/2012   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   GERD (gastroesophageal reflux disease)     Past Surgical History:  Procedure Laterality Date   BONE MARROW BIOPSY     DIAGNOSTIC LAPAROSCOPY     DILATATION & CURETTAGE/HYSTEROSCOPY WITH MYOSURE N/A 08/27/2018   Procedure:  DILATATION & CURETTAGE/HYSTEROSCOPY WITH MINERVA;  Surgeon: Gae Dry, MD;  Location: [facility] ORS;  Service: Gynecology;  Laterality: N/A;   DILATION AND CURETTAGE OF UTERUS     ESOPHAGOGASTRODUODENOSCOPY (EGD) WITH PROPOFOL N/A 08/07/2018   Procedure: ESOPHAGOGASTRODUODENOSCOPY (EGD) WITH PROPOFOL;  Surgeon: Jonathon Bellows, MD;  Location: [facility] ENDOSCOPY;  Service: Gastroenterology;  Laterality: N/A;   ESOPHAGOGASTRODUODENOSCOPY (EGD) WITH PROPOFOL N/A 07/20/2021   Procedure: ESOPHAGOGASTRODUODENOSCOPY (EGD) WITH PROPOFOL;  Surgeon: Jonathon Bellows, MD;  Location: [facility] ENDOSCOPY;  Service: Gastroenterology;  Laterality: N/A;   laparoscopy     ROOT CANAL     WISDOM TOOTH EXTRACTION      Prior to Admission medications   Medication Sig Start Date End Date Taking? Authorizing Provider  ADVAIR HFA 230-21 MCG/ACT inhaler USE 2 INHALATIONS TWICE A DAY 04/26/21   Crecencio Mc, MD  albuterol (VENTOLIN HFA) 108 (90 Base) MCG/ACT inhaler USE 2 INHALATIONS EVERY 6 HOURS AS NEEDED FOR WHEEZING OR SHORTNESS OF BREATH 10/18/21   Crecencio Mc, MD  ALPRAZolam Duanne Moron) 0.25 MG tablet TAKE 1 TABLET BY MOUTH AS NEEDED FOR PANIC ATTACKS 12/07/21   Crecencio Mc, MD  Azelastine HCl 137 MCG/SPRAY SOLN USE 1 TO 2 SPRAYS IN EACH NOSTRIL TWICE A DAY AS NEEDED FOR RHINITIS, NASAL DRAINAGE AS DIRECTED 09/18/21   Garnet Sierras, DO  buPROPion (WELLBUTRIN XL) 150 MG 24 hr tablet Take 1 tablet (150 mg total) by mouth daily. 12/07/21   Crecencio Mc, MD  Cholecalciferol (VITAMIN D3) 125 MCG (5000 UT) TABS Take 1 tablet by mouth daily.    [provider]  EPINEPHrine 0.3 mg/0.3 mL IJ SOAJ injection See admin instructions. for allergic reaction 02/17/20   [provider]  fluticasone (FLONASE) 50 MCG/ACT nasal spray USE 1 SPRAY IN EACH NOSTRIL TWICE A DAY AS NEEDED FOR ALLERGIES OR RHINITIS (NASAL CONGESTION) 11/14/21   Crecencio Mc, MD  montelukast (SINGULAIR) 10 MG tablet Take 1 tablet (10 mg total) by mouth  at bedtime. 12/07/21   Crecencio Mc, MD  nebivolol (BYSTOLIC) 5 MG tablet Take 1 tablet (5 mg total) by mouth daily. 11/08/20   Minna Merritts, MD  pantoprazole (PROTONIX) 40 MG tablet TAKE 1 TABLET TWICE A DAY 09/07/20   Jonathon Bellows, MD  propranolol (INDERAL) 10 MG tablet Take 1 tablet (10 mg total) by mouth 3 (three) times daily. Take only as needed for tachycardia Patient not taking: Reported on 08/21/2021 10/02/20   Minna Merritts, MD  Semaglutide,0.25 or 0.5MG/DOS, (OZEMPIC, 0.25 OR 0.5 MG/DOSE,) 2 MG/1.5ML SOPN Inject 0.5 mg into the skin once a week. 10/12/21   [provider]  sertraline (ZOLOFT) 50 MG tablet Take 1 tablet (50 mg total) by mouth daily. 12/07/21   Crecencio Mc, MD  Tiotropium Bromide Monohydrate (SPIRIVA RESPIMAT) 1.25 MCG/ACT AERS Inhale 2 puffs  into the lungs daily. 05/08/21   Garnet Sierras, DO    Allergies Buprenorphine hcl, Morphine and related, Prednisone, and Tramadol hcl  Family History  Problem Relation Age of Onset   Endometriosis Mother    Allergic rhinitis Mother    Cancer Father        benign liver CA   Stroke Father        recurrent blood clots on the brain    Lupus Father    Cancer Maternal Grandmother 75       ovarian Ca, still surviving    Osteoporosis Maternal Grandmother    Ovarian cancer Maternal Grandmother    Heart disease Maternal Grandfather    Cancer Maternal Grandfather 71       colon CA. metastatic   Diabetes Maternal Uncle    Heart disease Paternal Grandmother    CAD Brother 58       early at age 34   Immunodeficiency Brother    Breast cancer Cousin    Colon cancer Neg Hx    Kidney cancer Neg Hx    Bladder Cancer Neg Hx    Angioedema Neg Hx    Atopy Neg Hx    Asthma Neg Hx    Eczema Neg Hx    Urticaria Neg Hx     Social History Social History   Tobacco Use   Smoking status: Every Day    Packs/day: 0.50    Years: 10.00    Pack years: 5.00    Types: Cigarettes   Smokeless tobacco: Never  Vaping Use    Vaping Use: Former  Substance Use Topics   Alcohol use: Yes    Alcohol/week: 0.0 standard drinks    Comment: rarely   Drug use: No     Review of Systems  Constitutional: No fever/chills Eyes:  No discharge ENT: No upper respiratory complaints. Respiratory: Patient has cough. No SOB/ use of accessory muscles to breath Gastrointestinal:   No nausea, no vomiting.  No diarrhea.  No constipation. Musculoskeletal: Negative for musculoskeletal pain. Skin: Negative for rash, abrasions, lacerations, ecchymosis.   ____________________________________________   PHYSICAL EXAM:  VITAL SIGNS: ED Triage Vitals [12/13/21 1256]  Enc Vitals Group     BP      Pulse      Resp      Temp      Temp src      SpO2      Weight      Height      Head Circumference      Peak Flow      Pain Score 0     Pain Loc      Pain Edu?      Excl. in [place]?      Constitutional: Alert and oriented. Patient is lying supine. Eyes: Conjunctivae are normal. PERRL. EOMI. Head: Atraumatic. ENT:      Ears: Tympanic membranes are mildly injected with mild effusion bilaterally.       Nose: No congestion/rhinnorhea.      Mouth/Throat: Mucous membranes are moist. Posterior pharynx is mildly erythematous.  Hematological/Lymphatic/Immunilogical: No cervical lymphadenopathy.  Cardiovascular: Normal rate, regular rhythm. Normal S1 and S2.  Good peripheral circulation. Respiratory: Normal respiratory effort without tachypnea or retractions. Lungs CTAB. Good air entry to the bases with no decreased or absent breath sounds. Gastrointestinal: Bowel sounds 4 quadrants. Soft and nontender to palpation. No guarding or rigidity. No palpable masses. No distention. No CVA tenderness. Musculoskeletal: Full range of motion to all  extremities. No gross deformities appreciated. Neurologic:  Normal speech and language. No gross focal neurologic deficits are appreciated.  Skin:  Skin is warm, dry and intact. No rash  noted. Psychiatric: Mood and affect are normal. Speech and behavior are normal. Patient exhibits appropriate insight and judgement.  ____________________________________________   LABS (all labs ordered are listed, but only abnormal results are displayed)  Labs Reviewed  NOVEL CORONAVIRUS, NAA  COVID-19, FLU A+B NAA   ____________________________________________  EKG   ____________________________________________  RADIOLOGY   No results found.  ____________________________________________    PROCEDURES  Procedure(s) performed:     Procedures     Medications - No data to display   ____________________________________________   INITIAL IMPRESSION / ASSESSMENT AND PLAN / ED COURSE  Pertinent labs & imaging results that were available during my care of the patient were reviewed by me and considered in my medical decision making (see chart for details).      Assessment and plan Cough 41 year old female presents to the urgent care with cough, nasal congestion and rhinorrhea for the past 2 days after being exposed to [place] at work.  COVID-19 testing in process at this time.  Recommended Zyrtec and Flonase over-the-counter as well as Tylenol and ibuprofen.     ____________________________________________  FINAL CLINICAL IMPRESSION(S) / ED DIAGNOSES  Final diagnoses:  Encounter for screening for COVID-19  Acute cough      NEW MEDICATIONS STARTED DURING THIS VISIT:  ED Discharge Orders     None           This chart was dictated using voice recognition software/Dragon. Despite best efforts to proofread, errors can occur which can change the meaning. Any change was purely unintentional.     Lannie Fields, PA-C 12/13/21 1300

## 2021-12-14 LAB — NOVEL CORONAVIRUS, NAA: SARS-CoV-2, NAA: NOT DETECTED

## 2021-12-18 NOTE — Progress Notes (Deleted)
?Cardiology Office Note:   ? ?Date:  12/18/2021  ? ?ID:  Molly Stone, DOB [birth year], MRN [id number] ? ?PCP:  Crecencio Mc, MD  ?[facility] Cardiologist:  None  ?[facility] Electrophysiologist:  None  ? ?Referring MD: Crecencio Mc, MD  ? ?Chief Complaint:  1 year follow-up ? ?History of Present Illness:   ? ?Molly Stone is a 41 y.o. female with a hx of atrial tachycardia associated with alcohol intake, HTN, obesity, OSA not on CPAP who presents for follow-up. ? ?In 2019 she had palpitations and tachycardia with heart rates into the 140s. This was in the setting of alcohol use. This was not the first time this occurred.  ? ?Echo in 2020 showed LVEF 99-24%, normal diastolic parameters. Heart monitor showed NSR, 1 run of VT lasting 16 beats, 1 run SVT lasting 4 beats. Palpitations were well-controlled on Bystolic.  ? ?The patient was last seen 09/2020 and was doing well from a cardiac perspective. Coronary calcium score was discussed given family history.  ? ?Today,  ? ?Past Medical History:  ?Diagnosis Date  ? Allergy   ? Anemia   ? Anxiety   ? Asthma   ? Chlamydia   ? h/o  ? Concussion without loss of consciousness 08/04/2016  ? DDD (degenerative disc disease), cervical 04/07/2019  ? Disorder of vocal cord   ? spasmodic dysphonia  ? Dysmenorrhea   ? Dyspareunia, female   ? Endometriosis   ? Family history of endometriosis   ? GERD (gastroesophageal reflux disease)   ? Headache   ? migraine  ? Heavy periods   ? Hiatal hernia   ? History of nephrolithiasis   ? Hypertension   ? Jaundice, physiologic, newborn   ? Lupus ([place])   ? Obesity (BMI 30-39.9)   ? Sleep apnea   ? Tobacco user   ? Vaginal Pap smear, abnormal   ? ? ?Past Surgical History:  ?Procedure Laterality Date  ? BONE MARROW BIOPSY    ? DIAGNOSTIC LAPAROSCOPY    ? DILATATION & CURETTAGE/HYSTEROSCOPY WITH MYOSURE N/A 08/27/2018  ? Procedure: DILATATION & CURETTAGE/HYSTEROSCOPY WITH MINERVA;  Surgeon: Gae Dry, MD;  Location: [facility]  ORS;  Service: Gynecology;  Laterality: N/A;  ? DILATION AND CURETTAGE OF UTERUS    ? ESOPHAGOGASTRODUODENOSCOPY (EGD) WITH PROPOFOL N/A 08/07/2018  ? Procedure: ESOPHAGOGASTRODUODENOSCOPY (EGD) WITH PROPOFOL;  Surgeon: Jonathon Bellows, MD;  Location: [facility] ENDOSCOPY;  Service: Gastroenterology;  Laterality: N/A;  ? ESOPHAGOGASTRODUODENOSCOPY (EGD) WITH PROPOFOL N/A 07/20/2021  ? Procedure: ESOPHAGOGASTRODUODENOSCOPY (EGD) WITH PROPOFOL;  Surgeon: Jonathon Bellows, MD;  Location: [facility] ENDOSCOPY;  Service: Gastroenterology;  Laterality: N/A;  ? laparoscopy    ? ROOT CANAL    ? WISDOM TOOTH EXTRACTION    ? ? ?Current Medications: ?No outpatient medications have been marked as taking for the 12/19/21 encounter (Appointment) with Kathlen Mody, Gail Vendetti H, PA-C.  ?  ? ?Allergies:   Buprenorphine hcl, Morphine and related, Prednisone, and Tramadol hcl  ? ?Social History  ? ?Socioeconomic History  ? Marital status: Married  ?  Spouse name: Not on file  ? Number of children: Not on file  ? Years of education: Not on file  ? Highest education level: Not on file  ?Occupational History  ? Not on file  ?Tobacco Use  ? Smoking status: Every Day  ?  Packs/day: 0.50  ?  Years: 10.00  ?  Pack years: 5.00  ?  Types: Cigarettes  ? Smokeless tobacco: Never  ?Vaping  Use  ? Vaping Use: Former  ?Substance and Sexual Activity  ? Alcohol use: Yes  ?  Alcohol/week: 0.0 standard drinks  ?  Comment: rarely  ? Drug use: No  ? Sexual activity: Yes  ?  Birth control/protection: None  ?  Comment: vasectomy  ?Other Topics Concern  ? Not on file  ?Social History Narrative  ? Not on file  ? ?Social Determinants of Health  ? ?Financial Resource Strain: Not on file  ?Food Insecurity: Not on file  ?Transportation Needs: Not on file  ?Physical Activity: Not on file  ?Stress: Not on file  ?Social Connections: Not on file  ?  ? ?Family History: ?The patient's family history includes Allergic rhinitis in her mother; Breast cancer in her cousin; CAD (age of onset: 37) in her  brother; Cancer in her father; Cancer (age of onset: 45) in her maternal grandmother; Cancer (age of onset: 83) in her maternal grandfather; Diabetes in her maternal uncle; Endometriosis in her mother; Heart disease in her maternal grandfather and paternal grandmother; Immunodeficiency in her brother; Lupus in her father; Osteoporosis in her maternal grandmother; Ovarian cancer in her maternal grandmother; Stroke in her father. There is no history of Colon cancer, Kidney cancer, Bladder Cancer, Angioedema, Atopy, Asthma, Eczema, or Urticaria. ? ?ROS:   ?Please see the history of present illness.    ? All other systems reviewed and are negative. ? ?EKGs/Labs/Other Studies Reviewed:   ? ?The following studies were reviewed today: ? ?Heart monitor 10/2018 ?Event Monitor ?  ?Normal sinus rhythm ?Avg HR of 92 bpm.  ?  ?1 run of Ventricular Tachycardia occurred lasting 16 beats with a max rate of 203 bpm (avg 181 bpm).  ?  ?1 run of Supraventricular Tachycardia occurred lasting 4 beats with a max rate of 158 bpm (avg 144 bpm).  ?  ?Isolated SVEs were rare (<1.0%), SVE Triplets were rare (<1.0%), and no SVE Couplets ?were present. Isolated VEs were rare (<1.0%), and no VE Couplets or VE Triplets were present. ?  ?Patient triggered events were not associated with significant arrhythmia ?  ?Signed, ?Esmond Plants, MD, Ph.D ?[facility] ? ?Echo 10/2018 ?Study Conclusions  ? ?- Left ventricle: The cavity size was normal. Systolic function was  ?  normal. The estimated ejection fraction was in the range of 60%  ?  to 65%. Left ventricular diastolic function parameters were  ?  normal.  ?- Mitral valve: There was mild regurgitation.  ?- Left atrium: The atrium was mildly dilated.  ?- Right ventricle: Systolic function was normal.  ?- Pulmonary arteries: Systolic pressure was within the normal  ?  range.  ? ?EKG:  EKG is *** ordered today.  The ekg ordered today demonstrates *** ? ?Recent Labs: ?11/30/2021: BUN 10; Creatinine, Ser  0.64; Potassium 3.6; Sodium 136; TSH 0.956 ?12/06/2021: Hemoglobin 14.1; Platelets 188  ?Recent Lipid Panel ?   ?Component Value Date/Time  ? CHOL 205 (H) 09/02/2019 0919  ? CHOL 210 (H) 04/08/2019 0949  ? TRIG 57.0 09/02/2019 0919  ? HDL 43.90 09/02/2019 0919  ? HDL 46 04/08/2019 0949  ? CHOLHDL 5 09/02/2019 0919  ? VLDL 11.4 09/02/2019 0919  ? [place] 149 (H) 09/02/2019 0919  ? [place] 151 (H) 04/08/2019 0949  ? LDLDIRECT 155 (H) 08/18/2015 1511  ? ? ? ?Risk Assessment/Calculations:   ?{Does this patient have ATRIAL FIBRILLATION?:[id number]} ? ? ?Physical Exam:   ? ?VS:  There were no vitals taken for this visit.   ? ?  Wt Readings from Last 3 Encounters:  ?12/07/21 254 lb (115.2 kg)  ?12/06/21 254 lb (115.2 kg)  ?11/30/21 240 lb (108.9 kg)  ?  ? ?GEN: *** Well nourished, well developed in no acute distress ?HEENT: Normal ?NECK: No JVD; No carotid bruits ?LYMPHATICS: No lymphadenopathy ?CARDIAC: ***RRR, no murmurs, rubs, gallops ?RESPIRATORY:  Clear to auscultation without rales, wheezing or rhonchi  ?ABDOMEN: Soft, non-tender, non-distended ?MUSCULOSKELETAL:  No edema; No deformity  ?SKIN: Warm and dry ?NEUROLOGIC:  Alert and oriented x 3 ?PSYCHIATRIC:  Normal affect  ? ?ASSESSMENT:   ? ?No diagnosis found. ?PLAN:   ? ?In order of problems listed above: ? ?Atrial tachycardia ? ?HLD ? ?Obesity ? ?Disposition: Follow up {follow up:15908} with ***  ? ?Shared Decision Making/Informed Consent   ?{Are you ordering a CV Procedure (e.g. stress test, cath, DCCV, TEE, etc)?   Press F2        :[id number]}  ? ? ?Signed, ?Laiken Sandy Ninfa Meeker, PA-C  ?12/18/2021 12:22 PM    ?[place]  ?

## 2021-12-19 ENCOUNTER — Ambulatory Visit: Payer: Commercial Managed Care - PPO | Admitting: Medical

## 2022-01-03 ENCOUNTER — Other Ambulatory Visit: Payer: Self-pay | Admitting: Internal Medicine

## 2022-01-03 ENCOUNTER — Other Ambulatory Visit: Payer: Self-pay | Admitting: Cardiovascular Disease

## 2022-01-11 ENCOUNTER — Other Ambulatory Visit: Payer: Self-pay | Admitting: Cardiovascular Disease

## 2022-01-21 NOTE — Progress Notes (Signed)
?  ?  ? ? ? ?Date:  01/22/2022  ? ?ID:  Molly Stone, DOB [birth year], MRN [id number] ? ?Patient Location:  ?[place] ?Molly Stone [id number]  ? ?Provider location:   ?[place], [employer] office ? ?PCP:  Crecencio Mc, MD  ?Cardiologist:  [facility] ? ?Chief Complaint  ?Patient presents with  ? Tachycardia  ? ? ?History of Present Illness:   ? ?Molly Stone is a 41 y.o. female past medical history of ?Atrial tachycardia/paroxysmal tachycardia associated with alcohol intake, walking up steps in [place] ?hypertension .  ?obesity ?OSA  Not on CPAP ?Who presents for follow-up of her tachycardia/palpitations ? ?Last seen in clinic dec 2021 ?Tachycardia relatively well controlled ?Bystolic 5 once a day ?Rarely needs to take extra medication ?Has not had to take propranolol ? 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 ?No regular exercise program, tired at the end of the day ?Attributes this to long working hours ? ?EKG personally reviewed by myself on todays visit ?NSR rate 82 bpm, no st or t wave ? ?Other past medical history reviewed ?Event monitor,  ?Normal sinus rhythm ?Avg HR of 92 bpm.  ?1 run of Ventricular Tachycardia occurred lasting 16 beats with a max rate of 203 bpm (avg 181 bpm).  ?1 run of Supraventricular Tachycardia occurred lasting 4 beats with a max rate of 158 bpm (avg 144 bpm).  ?Isolated SVEs were rare (<1.0%), SVE Triplets were rare (<1.0%), and no SVE Couplets ?were present. Isolated VEs were rare (<1.0%), and no VE Couplets or VE Triplets were present. ?Patient triggered events were not associated with significant arrhythmia ?  ?Echocardiogram confirming ejection fraction greater than 55% ?  ?Previously with panic attacks ?Earlier in 2020 had bronchitis,  ?  ?Over the summer 2019, After eating out with alcoholic  drink, ?Developed faster heart rate, 146 bpm, would not slow  down, ?Diaphoretic, 1 1/2 hours for it to go away ?Eventually resolved on its own ?  ?Second time, again had a alcoholic drink, whisky, at a party ?Developed tachycardia again ?  ?Additional episode of tachycardia when walking in [place] ?It was hot, walking up the [facility] steps ?Had tachycardia had to sit down and recover ? ? ?Past Medical History:  ?Diagnosis Date  ? Allergy   ? Anemia   ? Anxiety   ? Asthma   ? Chlamydia   ? h/o  ? Concussion without loss of consciousness 08/04/2016  ? DDD (degenerative disc disease), cervical 04/07/2019  ? Disorder of vocal cord   ? spasmodic dysphonia  ? Dysmenorrhea   ? Dyspareunia, female   ? Endometriosis   ? Family history of endometriosis   ? GERD (gastroesophageal reflux disease)   ? Headache   ? migraine  ? Heavy periods   ? Hiatal hernia   ? History of nephrolithiasis   ? Hypertension   ? Jaundice, physiologic, newborn   ? Lupus ([place])   ? Obesity (BMI 30-39.9)   ? Sleep apnea   ? Tobacco user   ? Vaginal Pap smear, abnormal   ? ?Past Surgical History:  ?Procedure Laterality Date  ? BONE MARROW BIOPSY    ? DIAGNOSTIC LAPAROSCOPY    ? DILATATION & CURETTAGE/HYSTEROSCOPY WITH MYOSURE N/A 08/27/2018  ? Procedure: DILATATION & CURETTAGE/HYSTEROSCOPY WITH MINERVA;  Surgeon: Gae Dry, MD;  Location: [facility] ORS;  Service: Gynecology;  Laterality: N/A;  ?  DILATION AND CURETTAGE OF UTERUS    ? ESOPHAGOGASTRODUODENOSCOPY (EGD) WITH PROPOFOL N/A 08/07/2018  ? Procedure: ESOPHAGOGASTRODUODENOSCOPY (EGD) WITH PROPOFOL;  Surgeon: Jonathon Bellows, MD;  Location: [facility] ENDOSCOPY;  Service: Gastroenterology;  Laterality: N/A;  ? ESOPHAGOGASTRODUODENOSCOPY (EGD) WITH PROPOFOL N/A 07/20/2021  ? Procedure: ESOPHAGOGASTRODUODENOSCOPY (EGD) WITH PROPOFOL;  Surgeon: Jonathon Bellows, MD;  Location: [facility] ENDOSCOPY;  Service: Gastroenterology;  Laterality: N/A;  ? laparoscopy    ? ROOT CANAL    ? WISDOM TOOTH EXTRACTION    ?  ? ? ?Allergies:   Buprenorphine hcl, Morphine and related,  Prednisone, and Tramadol hcl  ? ?Social History  ? ?Tobacco Use  ? Smoking status: Every Day  ?  Packs/day: 0.50  ?  Years: 10.00  ?  Pack years: 5.00  ?  Types: Cigarettes  ? Smokeless tobacco: Never  ?Vaping Use  ? Vaping Use: Former  ?Substance Use Topics  ? Alcohol use: Yes  ?  Alcohol/week: 0.0 standard drinks  ?  Comment: rarely  ? Drug use: No  ?  ? ?Current Outpatient Medications on File Prior to Visit  ?Medication Sig Dispense Refill  ? ADVAIR HFA 230-21 MCG/ACT inhaler USE 2 INHALATIONS TWICE A DAY 36 g 3  ? albuterol (VENTOLIN HFA) 108 (90 Base) MCG/ACT inhaler USE 2 INHALATIONS EVERY 6 HOURS AS NEEDED FOR WHEEZING OR SHORTNESS OF BREATH 8.5 g 1  ? ALPRAZolam (XANAX) 0.25 MG tablet TAKE 1 TABLET BY MOUTH AS NEEDED FOR PANIC ATTACKS 20 tablet 0  ? Azelastine HCl 137 MCG/SPRAY SOLN USE 1 TO 2 SPRAYS IN EACH NOSTRIL TWICE A DAY AS NEEDED FOR RHINITIS, NASAL DRAINAGE AS DIRECTED 30 mL 0  ? Cholecalciferol (VITAMIN D3) 125 MCG (5000 UT) TABS Take 1 tablet by mouth daily.    ? EPINEPHrine 0.3 mg/0.3 mL IJ SOAJ injection See admin instructions. for allergic reaction    ? fluticasone (FLONASE) 50 MCG/ACT nasal spray USE 1 SPRAY IN EACH NOSTRIL TWICE A DAY AS NEEDED FOR ALLERGIES OR RHINITIS (NASAL CONGESTION) 48 g 3  ? montelukast (SINGULAIR) 10 MG tablet TAKE 1 TABLET AT BEDTIME 90 tablet 3  ? nebivolol (BYSTOLIC) 5 MG tablet TAKE 1 TABLET DAILY 30 tablet 0  ? pantoprazole (PROTONIX) 40 MG tablet TAKE 1 TABLET TWICE A DAY 180 tablet 1  ? propranolol (INDERAL) 10 MG tablet Take 1 tablet (10 mg total) by mouth 3 (three) times daily. Take only as needed for tachycardia 60 tablet 1  ? sertraline (ZOLOFT) 50 MG tablet Take 1 tablet (50 mg total) by mouth daily. 90 tablet 1  ? Tiotropium Bromide Monohydrate (SPIRIVA RESPIMAT) 1.25 MCG/ACT AERS Inhale 2 puffs into the lungs daily. 4 g 5  ? buPROPion (WELLBUTRIN XL) 150 MG 24 hr tablet Take 1 tablet (150 mg total) by mouth daily. (Patient not taking: Reported on  01/22/2022) 90 tablet 1  ? Semaglutide,0.25 or 0.5MG/DOS, (OZEMPIC, 0.25 OR 0.5 MG/DOSE,) 2 MG/1.5ML SOPN Inject 0.5 mg into the skin once a week. (Patient not taking: Reported on 01/22/2022)    ? ?No current facility-administered medications on file prior to visit.  ?  ? ?Family Hx: ?The patient's family history includes Allergic rhinitis in her mother; Breast cancer in her cousin; CAD (age of onset: 40) in her brother; Cancer in her father; Cancer (age of onset: 47) in her maternal grandmother; Cancer (age of onset: 40) in her maternal grandfather; Diabetes in her maternal uncle; Endometriosis in her mother; Heart disease in her maternal grandfather and paternal grandmother;  Immunodeficiency in her brother; Lupus in her father; Osteoporosis in her maternal grandmother; Ovarian cancer in her maternal grandmother; Stroke in her father. There is no history of Colon cancer, Kidney cancer, Bladder Cancer, Angioedema, Atopy, Asthma, Eczema, or Urticaria. ? ?ROS:   ?Please see the history of present illness.    ?Review of Systems  ?Constitutional: Negative.   ?HENT: Negative.    ?Respiratory: Negative.    ?Cardiovascular: Negative.   ?Gastrointestinal: Negative.   ?Musculoskeletal: Negative.   ?Neurological: Negative.   ?Psychiatric/Behavioral: Negative.    ?All other systems reviewed and are negative.  ? ?Labs/Other Tests and Data Reviewed:   ? ?Recent Labs: ?11/30/2021: BUN 10; Creatinine, Ser 0.64; Potassium 3.6; Sodium 136; TSH 0.956 ?12/06/2021: Hemoglobin 14.1; Platelets 188  ? ?Recent Lipid Panel ?Lab Results  ?Component Value Date/Time  ? CHOL 205 (H) 09/02/2019 09:19 AM  ? CHOL 210 (H) 04/08/2019 09:49 AM  ? TRIG 57.0 09/02/2019 09:19 AM  ? HDL 43.90 09/02/2019 09:19 AM  ? HDL 46 04/08/2019 09:49 AM  ? CHOLHDL 5 09/02/2019 09:19 AM  ? LDLCALC 149 (H) 09/02/2019 09:19 AM  ? LDLCALC 151 (H) 04/08/2019 09:49 AM  ? LDLDIRECT 155 (H) 08/18/2015 03:11 PM  ? ? ?Wt Readings from Last 3 Encounters:  ?01/22/22 115.2 kg   ?12/07/21 115.2 kg  ?12/06/21 115.2 kg  ?  ? ?Exam:   ? ?BP 100/72 (BP Location: Left Arm, Patient Position: Sitting, Cuff Size: Normal)   Pulse 82   Ht '5\' 9"'  (1.753 m)   Wt 115.2 kg   SpO2 99%   BMI 37.51 kg/m?   ?Constitutional

## 2022-01-22 ENCOUNTER — Ambulatory Visit: Payer: Commercial Managed Care - PPO | Admitting: Cardiovascular Disease

## 2022-01-22 ENCOUNTER — Encounter: Payer: Self-pay | Admitting: Cardiovascular Disease

## 2022-01-22 VITALS — BP 100/72 | HR 82 | Ht 69.0 in | Wt 254.0 lb

## 2022-01-22 DIAGNOSIS — I471 Supraventricular tachycardia: Secondary | ICD-10-CM

## 2022-01-22 DIAGNOSIS — E782 Mixed hyperlipidemia: Secondary | ICD-10-CM | POA: Diagnosis not present

## 2022-01-22 DIAGNOSIS — G4733 Obstructive sleep apnea (adult) (pediatric): Secondary | ICD-10-CM

## 2022-01-22 DIAGNOSIS — I479 Paroxysmal tachycardia, unspecified: Secondary | ICD-10-CM

## 2022-01-22 MED ORDER — PROPRANOLOL HCL 10 MG PO TABS: 10.0000 mg | ORAL_TABLET | Freq: Three times a day (TID) | ORAL | 3 refills | Status: AC

## 2022-01-22 MED ORDER — NEBIVOLOL HCL 5 MG PO TABS: 5.0000 mg | ORAL_TABLET | Freq: Every day | ORAL | 3 refills | Status: DC

## 2022-01-22 NOTE — Patient Instructions (Signed)
Medication Instructions:  No changes  If you need a refill on your cardiac medications before your next appointment, please call your pharmacy.   Lab work: No new labs needed  Testing/Procedures: No new testing needed  Follow-Up: At [facility], you and your health needs are our priority.  As part of our continuing mission to provide you with exceptional heart care, we have created designated Provider Care Teams.  These Care Teams include your primary Cardiologist (physician) and Advanced Practice Providers (APPs -  Physician Assistants and Nurse Practitioners) who all work together to provide you with the care you need, when you need it.  You will need a follow up appointment in 12 months  Providers on your designated Care Team:   Christopher Berge, NP Ryan Dunn, PA-C Cadence Furth, PA-C  COVID-19 Vaccine Information can be found at: [email or web address] For questions related to vaccine distribution or appointments, please email [email or web address] or call [phone].   

## 2022-01-28 ENCOUNTER — Telehealth: Payer: Self-pay

## 2022-01-28 NOTE — Telephone Encounter (Signed)
Pt called triage reporting vaginal odor after intercourse, transferred to front desk to schedule an appointment ?

## 2022-02-05 ENCOUNTER — Ambulatory Visit: Payer: Commercial Managed Care - PPO | Admitting: Obstetrics and Gynecology

## 2022-02-13 ENCOUNTER — Encounter: Payer: Self-pay | Admitting: Internal Medicine

## 2022-02-13 ENCOUNTER — Telehealth (INDEPENDENT_AMBULATORY_CARE_PROVIDER_SITE_OTHER): Payer: Commercial Managed Care - PPO | Admitting: Internal Medicine

## 2022-02-13 DIAGNOSIS — M5412 Radiculopathy, cervical region: Secondary | ICD-10-CM

## 2022-02-13 MED ORDER — METHYLPREDNISOLONE 4 MG PO TBPK
ORAL_TABLET | ORAL | 0 refills | Status: DC
Start: 2022-02-13 — End: 2022-06-05

## 2022-02-13 NOTE — Progress Notes (Signed)
Virtual Visit via Video Note ? ?I connected with Dasha Kawabata ? on 02/13/22 at  3:40 PM EDT by a video enabled telemedicine application and verified that I am speaking with the correct person using two identifiers. ? Location patient: [place] ?Location provider:work or home office ?Persons participating in the virtual visit: patient, provider ? ?I discussed the limitations and requested verbal permission for telemedicine visit. The patient expressed understanding and agreed to proceed. ? ? ?HPI: ? ?Acute telemedicine visit for : ?C/o x 2 days worse at night while lying in bed with covers over her arms stabbing/burning pain like fire ant pricks b/l arms and burning/itching sensation tried otc hydrocortisone did not work using same moonlight gain detergent, cocoa butter skin soap and not new. No bugs bites on new meds h/o shingles on left back and this feels like this in b/l arms ?She reports no rash and she is not taking ozempic  ?Mri neck 2018 ddd c3/4 disc protrusion and she has been having neck pain recently  ? ?: ?-Pertinent past medical history: see below ?-Pertinent medication allergies: ?Allergies  ?Allergen Reactions  ? Buprenorphine Hcl Itching and Other (See Comments)  ?  And shaking  ? Morphine And Related Itching and Other (See Comments)  ?  And shaking  ? Prednisone Anxiety and Palpitations  ? Tramadol Hcl Other (See Comments)  ?  anxious  ? ?-COVID-19 vaccine status:  ?Immunization History  ?Administered Date(s) Administered  ? Influenza,inj,Quad PF,6+ Mos 07/23/2016, 06/25/2017, 06/12/2019, 09/08/2020, 09/11/2021  ? Influenza-Unspecified 08/15/2015, 07/09/2018, 06/12/2019  ? PFIZER Comirnaty(Gray Top)Covid-19 Tri-Sucrose Vaccine 01/02/2021  ? PFIZER(Purple Top)SARS-COV-2 Vaccination 01/07/2020, 02/01/2020  ? PPD Test 07/18/2021  ? Pneumococcal Conjugate-13 09/28/2020  ? Td 08/18/2015  ? ? ? ?ROS: See pertinent positives and negatives per HPI. ? ?Past Medical History:  ?Diagnosis Date  ? Allergy   ?  Anemia   ? Anxiety   ? Asthma   ? Chlamydia   ? h/o  ? Concussion without loss of consciousness 08/04/2016  ? DDD (degenerative disc disease), cervical 04/07/2019  ? Disorder of vocal cord   ? spasmodic dysphonia  ? Dysmenorrhea   ? Dyspareunia, female   ? Endometriosis   ? Family history of endometriosis   ? GERD (gastroesophageal reflux disease)   ? Headache   ? migraine  ? Heavy periods   ? Hiatal hernia   ? History of nephrolithiasis   ? Hypertension   ? Jaundice, physiologic, newborn   ? Lupus ([place])   ? Obesity (BMI 30-39.9)   ? Sleep apnea   ? Tobacco user   ? Vaginal Pap smear, abnormal   ? ? ?Past Surgical History:  ?Procedure Laterality Date  ? BONE MARROW BIOPSY    ? DIAGNOSTIC LAPAROSCOPY    ? DILATATION & CURETTAGE/HYSTEROSCOPY WITH MYOSURE N/A 08/27/2018  ? Procedure: DILATATION & CURETTAGE/HYSTEROSCOPY WITH MINERVA;  Surgeon: Gae Dry, MD;  Location: [facility] ORS;  Service: Gynecology;  Laterality: N/A;  ? DILATION AND CURETTAGE OF UTERUS    ? ESOPHAGOGASTRODUODENOSCOPY (EGD) WITH PROPOFOL N/A 08/07/2018  ? Procedure: ESOPHAGOGASTRODUODENOSCOPY (EGD) WITH PROPOFOL;  Surgeon: Jonathon Bellows, MD;  Location: [facility] ENDOSCOPY;  Service: Gastroenterology;  Laterality: N/A;  ? ESOPHAGOGASTRODUODENOSCOPY (EGD) WITH PROPOFOL N/A 07/20/2021  ? Procedure: ESOPHAGOGASTRODUODENOSCOPY (EGD) WITH PROPOFOL;  Surgeon: Jonathon Bellows, MD;  Location: [facility] ENDOSCOPY;  Service: Gastroenterology;  Laterality: N/A;  ? laparoscopy    ? ROOT CANAL    ? WISDOM TOOTH EXTRACTION    ? ? ? ?Current Outpatient  Medications:  ?  ADVAIR HFA 161-09 MCG/ACT inhaler, USE 2 INHALATIONS TWICE A DAY, Disp: 36 g, Rfl: 3 ?  albuterol (VENTOLIN HFA) 108 (90 Base) MCG/ACT inhaler, USE 2 INHALATIONS EVERY 6 HOURS AS NEEDED FOR WHEEZING OR SHORTNESS OF BREATH, Disp: 8.5 g, Rfl: 1 ?  ALPRAZolam (XANAX) 0.25 MG tablet, TAKE 1 TABLET BY MOUTH AS NEEDED FOR PANIC ATTACKS, Disp: 20 tablet, Rfl: 0 ?  Azelastine HCl 137 MCG/SPRAY SOLN, USE 1 TO 2 SPRAYS IN  EACH NOSTRIL TWICE A DAY AS NEEDED FOR RHINITIS, NASAL DRAINAGE AS DIRECTED, Disp: 30 mL, Rfl: 0 ?  Cholecalciferol (VITAMIN D3) 125 MCG (5000 UT) TABS, Take 1 tablet by mouth daily., Disp: , Rfl:  ?  EPINEPHrine 0.3 mg/0.3 mL IJ SOAJ injection, See admin instructions. for allergic reaction, Disp: , Rfl:  ?  fluticasone (FLONASE) 50 MCG/ACT nasal spray, USE 1 SPRAY IN EACH NOSTRIL TWICE A DAY AS NEEDED FOR ALLERGIES OR RHINITIS (NASAL CONGESTION), Disp: 48 g, Rfl: 3 ?  methylPREDNISolone (MEDROL DOSEPAK) 4 MG TBPK tablet, Use as directed take in the am with food, Disp: 21 tablet, Rfl: 0 ?  montelukast (SINGULAIR) 10 MG tablet, TAKE 1 TABLET AT BEDTIME, Disp: 90 tablet, Rfl: 3 ?  nebivolol (BYSTOLIC) 5 MG tablet, Take 1 tablet (5 mg total) by mouth daily., Disp: 90 tablet, Rfl: 3 ?  pantoprazole (PROTONIX) 40 MG tablet, TAKE 1 TABLET TWICE A DAY, Disp: 180 tablet, Rfl: 1 ?  propranolol (INDERAL) 10 MG tablet, Take 1 tablet (10 mg total) by mouth 3 (three) times daily. Take only as needed for tachycardia, Disp: 60 tablet, Rfl: 3 ?  buPROPion (WELLBUTRIN XL) 150 MG 24 hr tablet, Take 1 tablet (150 mg total) by mouth daily. (Patient not taking: Reported on 01/22/2022), Disp: 90 tablet, Rfl: 1 ?  Semaglutide,0.25 or 0.5MG/DOS, (OZEMPIC, 0.25 OR 0.5 MG/DOSE,) 2 MG/1.5ML SOPN, Inject 0.5 mg into the skin once a week. (Patient not taking: Reported on 01/22/2022), Disp: , Rfl:  ?  sertraline (ZOLOFT) 50 MG tablet, Take 1 tablet (50 mg total) by mouth daily. (Patient not taking: Reported on 02/13/2022), Disp: 90 tablet, Rfl: 1 ?  Tiotropium Bromide Monohydrate (SPIRIVA RESPIMAT) 1.25 MCG/ACT AERS, Inhale 2 puffs into the lungs daily. (Patient not taking: Reported on 02/13/2022), Disp: 4 g, Rfl: 5 ? ?EXAM: ? ?VITALS per patient if applicable: ? ?GENERAL: alert, oriented, appears well and in no acute distress ? ?HEENT: atraumatic, conjunttiva clear, no obvious abnormalities on inspection of external nose and ears ? ?NECK: normal  movements of the head and neck ? ?LUNGS: on inspection no signs of respiratory distress, breathing rate appears normal, no obvious gross SOB, gasping or wheezing ? ?CV: no obvious cyanosis ? ?MS: moves all visible extremities without noticeable abnormality ? ?PSYCH/NEURO: pleasant and cooperative, no obvious depression or anxiety, speech and thought processing grossly intact ? ?ASSESSMENT AND PLAN: ? ?Discussed the following assessment and plan: ? ?Cervical radiculopathy - Plan: methylPREDNISolone (MEDROL DOSEPAK) 4 MG TBPK tablet ?Consider [facility] spine vs pm&R for neck injections if steroids dont help  ?Consider repeat MRI cervical as sxs maybe related abnormal mri 12/18/16 ? ?-we discussed possible serious and likely etiologies, options for evaluation and workup, limitations of telemedicine visit vs in person visit, treatment, treatment risks and precautions. Pt is agreeable to treatment via telemedicine at this moment.  ? ?I discussed the assessment and treatment plan with the patient. The patient was provided an opportunity to ask questions and all were  answered. The patient agreed with the plan and demonstrated an understanding of the instructions. ?  ? ?Time spent 20 minutes ?Nino Glow McLean-Scocuzza, MD   ?

## 2022-03-06 ENCOUNTER — Inpatient Hospital Stay: Payer: Commercial Managed Care - PPO | Admitting: Oncology

## 2022-03-06 ENCOUNTER — Inpatient Hospital Stay: Payer: Commercial Managed Care - PPO | Attending: Oncology

## 2022-03-08 ENCOUNTER — Encounter: Payer: Self-pay | Admitting: Internal Medicine

## 2022-03-08 ENCOUNTER — Telehealth (INDEPENDENT_AMBULATORY_CARE_PROVIDER_SITE_OTHER): Payer: Commercial Managed Care - PPO | Admitting: Internal Medicine

## 2022-03-08 DIAGNOSIS — Z720 Tobacco use: Secondary | ICD-10-CM | POA: Insufficient documentation

## 2022-03-08 DIAGNOSIS — J0101 Acute recurrent maxillary sinusitis: Secondary | ICD-10-CM

## 2022-03-08 DIAGNOSIS — Z3042 Encounter for surveillance of injectable contraceptive: Secondary | ICD-10-CM | POA: Diagnosis not present

## 2022-03-08 DIAGNOSIS — J302 Other seasonal allergic rhinitis: Secondary | ICD-10-CM

## 2022-03-08 DIAGNOSIS — J3089 Other allergic rhinitis: Secondary | ICD-10-CM

## 2022-03-08 DIAGNOSIS — H1010 Acute atopic conjunctivitis, unspecified eye: Secondary | ICD-10-CM

## 2022-03-08 MED ORDER — PREDNISONE 10 MG PO TABS: ORAL_TABLET | ORAL | 0 refills | Status: DC

## 2022-03-08 MED ORDER — AMOXICILLIN-POT CLAVULANATE 875-125 MG PO TABS: 1.0000 | ORAL_TABLET | Freq: Two times a day (BID) | ORAL | 0 refills | Status: DC

## 2022-03-08 NOTE — Assessment & Plan Note (Signed)
Encouraged to quit. 

## 2022-03-08 NOTE — Progress Notes (Signed)
Virtual Visit via [place]  Note  This visit type was conducted due to national recommendations for restrictions regarding the COVID-19 pandemic (e.g. social distancing).  This format is felt to be most appropriate for this patient at this time.  All issues noted in this document were discussed and addressed.  No physical exam was performed (except for noted visual exam findings with Video Visits).   I connected withNAME@ on 03/08/22 at  4:30 PM EDT by a video enabled telemedicine application  and verified that I am speaking with the correct person using two identifiers. Location patient: home Location provider: work or home office Persons participating in the virtual visit: patient, provider  I discussed the limitations, risks, security and privacy concerns of performing an evaluation and management service by telephone and the availability of in person appointments. I also discussed with the patient that there may be a patient responsible charge related to this service. The patient expressed understanding and agreed to proceed.  Reason for visit: sinusitis , acute   HPI:  Left sided frontal and maxillary pain ,  nasal drainage and cough for the last 4 days. Multiple family members sick .  Getting married in one week.    ROS: See pertinent positives and negatives per HPI.  Past Medical History:  Diagnosis Date   Allergy    Anemia    Anxiety    Asthma    Chlamydia    h/o   Concussion without loss of consciousness 08/04/2016   DDD (degenerative disc disease), cervical 04/07/2019   Disorder of vocal cord    spasmodic dysphonia   Dysmenorrhea    Dyspareunia, female    Endometriosis    Family history of endometriosis    GERD (gastroesophageal reflux disease)    Headache    migraine   Heavy periods    Hiatal hernia    History of nephrolithiasis    Hypertension    Jaundice, physiologic, newborn    Lupus ([place])    Obesity (BMI 30-39.9)    Sleep apnea    Tobacco user     Vaginal Pap smear, abnormal     Past Surgical History:  Procedure Laterality Date   BONE MARROW BIOPSY     DIAGNOSTIC LAPAROSCOPY     DILATATION & CURETTAGE/HYSTEROSCOPY WITH MYOSURE N/A 08/27/2018   Procedure: DILATATION & CURETTAGE/HYSTEROSCOPY WITH MINERVA;  Surgeon: Gae Dry, MD;  Location: [facility] ORS;  Service: Gynecology;  Laterality: N/A;   DILATION AND CURETTAGE OF UTERUS     ESOPHAGOGASTRODUODENOSCOPY (EGD) WITH PROPOFOL N/A 08/07/2018   Procedure: ESOPHAGOGASTRODUODENOSCOPY (EGD) WITH PROPOFOL;  Surgeon: Jonathon Bellows, MD;  Location: [facility] ENDOSCOPY;  Service: Gastroenterology;  Laterality: N/A;   ESOPHAGOGASTRODUODENOSCOPY (EGD) WITH PROPOFOL N/A 07/20/2021   Procedure: ESOPHAGOGASTRODUODENOSCOPY (EGD) WITH PROPOFOL;  Surgeon: Jonathon Bellows, MD;  Location: [facility] ENDOSCOPY;  Service: Gastroenterology;  Laterality: N/A;   laparoscopy     ROOT CANAL     WISDOM TOOTH EXTRACTION      Family History  Problem Relation Age of Onset   Endometriosis Mother    Allergic rhinitis Mother    Cancer Father        benign liver CA   Stroke Father        recurrent blood clots on the brain    Lupus Father    Cancer Maternal Grandmother 39       ovarian Ca, still surviving    Osteoporosis Maternal Grandmother    Ovarian cancer Maternal Grandmother    Heart disease Maternal  Grandfather    Cancer Maternal Grandfather 61       colon CA. metastatic   Diabetes Maternal Uncle    Heart disease Paternal Grandmother    CAD Brother 18       early at age 51   Immunodeficiency Brother    Breast cancer Cousin    Colon cancer Neg Hx    Kidney cancer Neg Hx    Bladder Cancer Neg Hx    Angioedema Neg Hx    Atopy Neg Hx    Asthma Neg Hx    Eczema Neg Hx    Urticaria Neg Hx     SOCIAL HX:  reports that she has been smoking cigarettes. She has a 5.00 pack-year smoking history. She has never used smokeless tobacco. She reports current alcohol use. She reports that she does not use drugs.     Current Outpatient Medications:    ADVAIR HFA 230-21 MCG/ACT inhaler, USE 2 INHALATIONS TWICE A DAY, Disp: 36 g, Rfl: 3   albuterol (VENTOLIN HFA) 108 (90 Base) MCG/ACT inhaler, USE 2 INHALATIONS EVERY 6 HOURS AS NEEDED FOR WHEEZING OR SHORTNESS OF BREATH, Disp: 8.5 g, Rfl: 1   ALPRAZolam (XANAX) 0.25 MG tablet, TAKE 1 TABLET BY MOUTH AS NEEDED FOR PANIC ATTACKS, Disp: 20 tablet, Rfl: 0   amoxicillin-clavulanate (AUGMENTIN) 875-125 MG tablet, Take 1 tablet by mouth 2 (two) times daily., Disp: 14 tablet, Rfl: 0   Azelastine HCl 137 MCG/SPRAY SOLN, USE 1 TO 2 SPRAYS IN EACH NOSTRIL TWICE A DAY AS NEEDED FOR RHINITIS, NASAL DRAINAGE AS DIRECTED, Disp: 30 mL, Rfl: 0   Cholecalciferol (VITAMIN D3) 125 MCG (5000 UT) TABS, Take 1 tablet by mouth daily., Disp: , Rfl:    EPINEPHrine 0.3 mg/0.3 mL IJ SOAJ injection, See admin instructions. for allergic reaction, Disp: , Rfl:    fluticasone (FLONASE) 50 MCG/ACT nasal spray, USE 1 SPRAY IN EACH NOSTRIL TWICE A DAY AS NEEDED FOR ALLERGIES OR RHINITIS (NASAL CONGESTION), Disp: 48 g, Rfl: 3   methylPREDNISolone (MEDROL DOSEPAK) 4 MG TBPK tablet, Use as directed take in the am with food, Disp: 21 tablet, Rfl: 0   montelukast (SINGULAIR) 10 MG tablet, TAKE 1 TABLET AT BEDTIME, Disp: 90 tablet, Rfl: 3   nebivolol (BYSTOLIC) 5 MG tablet, Take 1 tablet (5 mg total) by mouth daily., Disp: 90 tablet, Rfl: 3   pantoprazole (PROTONIX) 40 MG tablet, TAKE 1 TABLET TWICE A DAY, Disp: 180 tablet, Rfl: 1   predniSONE (DELTASONE) 10 MG tablet, 6 tablets on Day 1 , then reduce by 1 tablet daily until gone, Disp: 21 tablet, Rfl: 0   propranolol (INDERAL) 10 MG tablet, Take 1 tablet (10 mg total) by mouth 3 (three) times daily. Take only as needed for tachycardia, Disp: 60 tablet, Rfl: 3   Semaglutide,0.25 or 0.5MG/DOS, (OZEMPIC, 0.25 OR 0.5 MG/DOSE,) 2 MG/1.5ML SOPN, Inject 0.5 mg into the skin once a week., Disp: , Rfl:    sertraline (ZOLOFT) 50 MG tablet, Take 1 tablet (50  mg total) by mouth daily., Disp: 90 tablet, Rfl: 1   Tiotropium Bromide Monohydrate (SPIRIVA RESPIMAT) 1.25 MCG/ACT AERS, Inhale 2 puffs into the lungs daily., Disp: 4 g, Rfl: 5   buPROPion (WELLBUTRIN XL) 150 MG 24 hr tablet, Take 1 tablet (150 mg total) by mouth daily. (Patient not taking: Reported on 01/22/2022), Disp: 90 tablet, Rfl: 1  EXAM:  VITALS per patient if applicable:  GENERAL: alert, oriented, appears well and in no acute distress  HEENT:  atraumatic, conjunttiva clear, no obvious abnormalities on inspection of external nose and ears  NECK: normal movements of the head and neck  LUNGS: on inspection no signs of respiratory distress, breathing rate appears normal, no obvious gross SOB, gasping or wheezing  CV: no obvious cyanosis  MS: moves all visible extremities without noticeable abnormality  PSYCH/NEURO: pleasant and cooperative, no obvious depression or anxiety, speech and thought processing grossly intact  ASSESSMENT AND PLAN:  Discussed the following assessment and plan:  Acute recurrent maxillary sinusitis  Morbid obesity (HCC)  Depot contraception  Tobacco abuse  Seasonal and perennial allergic rhinoconjunctivitis  Acute sinusitis Sinus drainage and congestion withleft sided facial pain and  cough productive of purulent sputum for the past 4 days.  Empiric augmentin and prednisone taper prescribed.  Daily use of a probiotic advised for 3 weeks.   Morbid obesity ([place]) She lost weight using Ozempic prescribed by [facility] physician but was lost to follow up .  She would like to resume medication after her wedding   Depot contraception Taking Depo provera OBGYN. . Estrogen contraindicated due to tobacco abuse.   Tobacco abuse Encouraged to quit  Seasonal and perennial allergic rhinoconjunctivitis She is currently using a steroid nasal spray and a non sedating antihistamine. Advised to add benadryl at night     I discussed the assessment and treatment plan  with the patient. The patient was provided an opportunity to ask questions and all were answered. The patient agreed with the plan and demonstrated an understanding of the instructions.   The patient was advised to call back or seek an in-person evaluation if the symptoms worsen or if the condition fails to improve as anticipated.   I spent 20 minutes dedicated to the care of this patient on the date of this encounter to include pre-visit review of her medical history,  recent office visits at [facility] , Face-to-face time with the patient , and post visit ordering of testing and therapeutics.    Crecencio Mc, MD

## 2022-03-08 NOTE — Assessment & Plan Note (Addendum)
Sinus drainage and congestion withleft sided facial pain and  cough productive of purulent sputum for the past 4 days.  Empiric augmentin and prednisone taper prescribed.  Daily use of a probiotic advised for 3 weeks.

## 2022-03-08 NOTE — Assessment & Plan Note (Addendum)
Taking Depo provera OBGYN. . Estrogen contraindicated due to tobacco abuse.

## 2022-03-08 NOTE — Assessment & Plan Note (Addendum)
She is currently using a steroid nasal spray and a non sedating antihistamine. Advised to add benadryl at night

## 2022-03-08 NOTE — Assessment & Plan Note (Signed)
She lost weight using Ozempic prescribed by [facility] physician but was lost to follow up .  She would like to resume medication after her wedding

## 2022-03-08 NOTE — Patient Instructions (Signed)
WE can restart Ozempic/Wegovy after you get married   .[email or web address]

## 2022-04-03 ENCOUNTER — Other Ambulatory Visit: Payer: Self-pay | Admitting: Internal Medicine

## 2022-06-05 ENCOUNTER — Telehealth (INDEPENDENT_AMBULATORY_CARE_PROVIDER_SITE_OTHER): Payer: Commercial Managed Care - PPO | Admitting: Internal Medicine

## 2022-06-05 ENCOUNTER — Encounter: Payer: Self-pay | Admitting: Internal Medicine

## 2022-06-05 DIAGNOSIS — J4541 Moderate persistent asthma with (acute) exacerbation: Secondary | ICD-10-CM | POA: Diagnosis not present

## 2022-06-05 DIAGNOSIS — L739 Follicular disorder, unspecified: Secondary | ICD-10-CM | POA: Diagnosis not present

## 2022-06-05 MED ORDER — PREDNISONE 10 MG PO TABS: 20.0000 mg | ORAL_TABLET | Freq: Every day | ORAL | 0 refills | Status: DC

## 2022-06-05 MED ORDER — ALBUTEROL SULFATE HFA 108 (90 BASE) MCG/ACT IN AERS: INHALATION_SPRAY | RESPIRATORY_TRACT | 1 refills | Status: AC

## 2022-06-05 MED ORDER — DOXYCYCLINE HYCLATE 100 MG PO TABS: 100.0000 mg | ORAL_TABLET | Freq: Two times a day (BID) | ORAL | 0 refills | Status: DC

## 2022-06-05 NOTE — Progress Notes (Unsigned)
Virtual Visit via [facility]   Note    This format is felt to be most appropriate for this patient at this time.  All issues noted in this document were discussed and addressed.  No physical exam was performed (except for noted visual exam findings with Video Visits).   I connected with Molly Stone on 06/05/22 at  4:30 PM EDT by a video enabled telemedicine application and verified that I am speaking with the correct person using two identifiers. Location patient: home Location provider: work or home office Persons participating in the virtual visit: patient, provider  I discussed the limitations, risks, security and privacy concerns of performing an evaluation and management service by telephone and the availability of in person appointments. I also discussed with the patient that there may be a patient responsible charge related to this service. The patient expressed understanding and agreed to proceed.  Reason for visit: multiple issues   HPI:  1) Treated for impetigo by urgent care  with mupirocin for the past 5 days,  with no improvement.  Symptoms are red papular rash on checks, forehead and one on tight shoulder.  Children not affected   2) URI with cough,  tonsillar erythema and congestion  for the past 2 weeks . Short of breath with exertion.  Using advair.  Needs albuterol refill. No fevers,  cough stared when she quit smoking a few weeks ago.  Cough productive of brown sputum.    ROS: See pertinent positives and negatives per HPI.  Past Medical History:  Diagnosis Date   Allergy    Anemia    Anxiety    Asthma    Chlamydia    h/o   Concussion without loss of consciousness 08/04/2016   DDD (degenerative disc disease), cervical 04/07/2019   Disorder of vocal cord    spasmodic dysphonia   Dysmenorrhea    Dyspareunia, female    Endometriosis    Family history of endometriosis    GERD (gastroesophageal reflux disease)    Headache    migraine   Heavy periods    Hiatal hernia     History of nephrolithiasis    Hypertension    Jaundice, physiologic, newborn    Lupus ([place])    Obesity (BMI 30-39.9)    Sleep apnea    Tobacco user    Vaginal Pap smear, abnormal     Past Surgical History:  Procedure Laterality Date   BONE MARROW BIOPSY     DIAGNOSTIC LAPAROSCOPY     DILATATION & CURETTAGE/HYSTEROSCOPY WITH MYOSURE N/A 08/27/2018   Procedure: DILATATION & CURETTAGE/HYSTEROSCOPY WITH MINERVA;  Surgeon: Gae Dry, MD;  Location: [facility] ORS;  Service: Gynecology;  Laterality: N/A;   DILATION AND CURETTAGE OF UTERUS     ESOPHAGOGASTRODUODENOSCOPY (EGD) WITH PROPOFOL N/A 08/07/2018   Procedure: ESOPHAGOGASTRODUODENOSCOPY (EGD) WITH PROPOFOL;  Surgeon: Jonathon Bellows, MD;  Location: [facility] ENDOSCOPY;  Service: Gastroenterology;  Laterality: N/A;   ESOPHAGOGASTRODUODENOSCOPY (EGD) WITH PROPOFOL N/A 07/20/2021   Procedure: ESOPHAGOGASTRODUODENOSCOPY (EGD) WITH PROPOFOL;  Surgeon: Jonathon Bellows, MD;  Location: [facility] ENDOSCOPY;  Service: Gastroenterology;  Laterality: N/A;   laparoscopy     ROOT CANAL     WISDOM TOOTH EXTRACTION      Family History  Problem Relation Age of Onset   Endometriosis Mother    Allergic rhinitis Mother    Cancer Father        benign liver CA   Stroke Father        recurrent blood clots on the brain  Lupus Father    Cancer Maternal Grandmother 57       ovarian Ca, still surviving    Osteoporosis Maternal Grandmother    Ovarian cancer Maternal Grandmother    Heart disease Maternal Grandfather    Cancer Maternal Grandfather 71       colon CA. metastatic   Diabetes Maternal Uncle    Heart disease Paternal Grandmother    CAD Brother 50       early at age 24   Immunodeficiency Brother    Breast cancer Cousin    Colon cancer Neg Hx    Kidney cancer Neg Hx    Bladder Cancer Neg Hx    Angioedema Neg Hx    Atopy Neg Hx    Asthma Neg Hx    Eczema Neg Hx    Urticaria Neg Hx     SOCIAL HX: recently relocated to [facility]. No tobacco  or alcohol use.    Current Outpatient Medications:    ADVAIR HFA 230-21 MCG/ACT inhaler, USE 2 INHALATIONS TWICE A DAY, Disp: 36 g, Rfl: 3   Cholecalciferol (VITAMIN D3) 125 MCG (5000 UT) TABS, Take 1 tablet by mouth daily., Disp: , Rfl:    doxycycline (VIBRA-TABS) 100 MG tablet, Take 1 tablet (100 mg total) by mouth 2 (two) times daily., Disp: 14 tablet, Rfl: 0   EPINEPHrine 0.3 mg/0.3 mL IJ SOAJ injection, See admin instructions. for allergic reaction, Disp: , Rfl:    fluticasone (FLONASE) 50 MCG/ACT nasal spray, USE 1 SPRAY IN EACH NOSTRIL TWICE A DAY AS NEEDED FOR ALLERGIES OR RHINITIS (NASAL CONGESTION), Disp: 48 g, Rfl: 3   montelukast (SINGULAIR) 10 MG tablet, TAKE 1 TABLET AT BEDTIME, Disp: 90 tablet, Rfl: 3   nebivolol (BYSTOLIC) 5 MG tablet, Take 1 tablet (5 mg total) by mouth daily., Disp: 90 tablet, Rfl: 3   pantoprazole (PROTONIX) 40 MG tablet, TAKE 1 TABLET TWICE A DAY, Disp: 180 tablet, Rfl: 1   albuterol (VENTOLIN HFA) 108 (90 Base) MCG/ACT inhaler, USE 2 INHALATIONS EVERY 6 HOURS AS NEEDED FOR WHEEZING OR SHORTNESS OF BREATH, Disp: 8.5 g, Rfl: 1   ALPRAZolam (XANAX) 0.25 MG tablet, TAKE 1 TABLET BY MOUTH AS NEEDED FOR PANIC ATTACKS (Patient not taking: Reported on 06/05/2022), Disp: 20 tablet, Rfl: 0   Azelastine HCl 137 MCG/SPRAY SOLN, USE 1 TO 2 SPRAYS IN EACH NOSTRIL TWICE A DAY AS NEEDED FOR RHINITIS, NASAL DRAINAGE AS DIRECTED (Patient not taking: Reported on 06/05/2022), Disp: 30 mL, Rfl: 0   mupirocin ointment (BACTROBAN) 2 %, Apply topically 3 (three) times daily., Disp: , Rfl:    predniSONE (DELTASONE) 10 MG tablet, Take 2 tablets (20 mg total) by mouth daily with breakfast., Disp: 5 tablet, Rfl: 0   propranolol (INDERAL) 10 MG tablet, Take 1 tablet (10 mg total) by mouth 3 (three) times daily. Take only as needed for tachycardia (Patient not taking: Reported on 06/05/2022), Disp: 60 tablet, Rfl: 3  EXAM:  VITALS per patient if applicable:  GENERAL: alert, oriented,  appears well and in no acute distress  HEENT: atraumatic, conjunttiva clear, no obvious abnormalities on inspection of external nose and ears  NECK: normal movements of the head and neck  LUNGS: on inspection no signs of respiratory distress, breathing rate appears normal, no obvious gross SOB, gasping or wheezing  CV: no obvious cyanosis  MS: moves all visible extremities without noticeable abnormality  PSYCH/NEURO: pleasant and cooperative, no obvious depression or anxiety, speech and thought processing grossly intact  ASSESSMENT AND PLAN:  Discussed the following assessment and plan:  Moderate persistent asthma with exacerbation  Folliculitis  Asthma exacerbation Symptoms are mild,  And she is not wheezing. Prednisone and doxycycline prescribed.    Folliculitis Unresponsive to mupirocine ointment prescribed by urgent care.  Doxycycline prescribed. Nancy Fetter avoidance recommended and probiotic advised.     I discussed the assessment and treatment plan with the patient. The patient was provided an opportunity to ask questions and all were answered. The patient agreed with the plan and demonstrated an understanding of the instructions.   The patient was advised to call back or seek an in-person evaluation if the symptoms worsen or if the condition fails to improve as anticipated.   I spent 30 minutes dedicated to the care of this patient on the date of this encounter to include pre-visit review of his medical history,  Face-to-face time with the patient , and post visit ordering of testing and therapeutics.    Crecencio Mc, MD

## 2022-06-06 ENCOUNTER — Telehealth: Payer: Self-pay | Admitting: Internal Medicine

## 2022-06-06 ENCOUNTER — Telehealth: Payer: Self-pay

## 2022-06-06 DIAGNOSIS — L739 Follicular disorder, unspecified: Secondary | ICD-10-CM | POA: Insufficient documentation

## 2022-06-06 MED ORDER — PREDNISONE 10 MG PO TABS: 20.0000 mg | ORAL_TABLET | Freq: Every day | ORAL | 0 refills | Status: AC

## 2022-06-06 NOTE — Telephone Encounter (Signed)
My mistake.  Resent with clearer instructions

## 2022-06-06 NOTE — Assessment & Plan Note (Signed)
Unresponsive to mupirocine ointment prescribed by urgent care.  Doxycycline prescribed. Nancy Fetter avoidance recommended and probiotic advised.

## 2022-06-06 NOTE — Telephone Encounter (Signed)
Received a fax from [facility] stating that they need clarification on the prednisone directions that was sent in yesterday.

## 2022-06-06 NOTE — Telephone Encounter (Signed)
Pt called stating the pharmacy need clarification for the medication-predniSONE

## 2022-06-06 NOTE — Telephone Encounter (Signed)
See previous message

## 2022-06-06 NOTE — Assessment & Plan Note (Signed)
Symptoms are mild,  And she is not wheezing. Prednisone and doxycycline prescribed.

## 2022-06-06 NOTE — Addendum Note (Signed)
Addended by: Crecencio Mc on: 06/06/2022 05:25 PM   Modules accepted: Orders

## 2022-06-07 NOTE — Telephone Encounter (Signed)
noted 

## 2022-07-02 ENCOUNTER — Ambulatory Visit: Payer: Commercial Managed Care - PPO | Admitting: Internal Medicine

## 2022-07-12 ENCOUNTER — Encounter: Payer: Self-pay | Admitting: Internal Medicine

## 2022-07-31 MED ORDER — MONTELUKAST SODIUM 10 MG PO TABS: 10.0000 mg | ORAL_TABLET | Freq: Every day | ORAL | 3 refills | Status: AC

## 2022-09-05 ENCOUNTER — Ambulatory Visit: Payer: Medicaid Other | Admitting: Internal Medicine

## 2022-10-03 ENCOUNTER — Telehealth: Payer: Self-pay | Admitting: Family Medicine

## 2022-10-03 DIAGNOSIS — J029 Acute pharyngitis, unspecified: Secondary | ICD-10-CM

## 2022-10-04 MED ORDER — AMOXICILLIN 500 MG PO CAPS: 500.0000 mg | ORAL_CAPSULE | Freq: Two times a day (BID) | ORAL | 0 refills | Status: AC

## 2022-10-04 NOTE — Addendum Note (Signed)
Addended by: Dellia Nims on: 10/04/2022 11:12 AM   Modules accepted: Orders

## 2022-10-04 NOTE — Progress Notes (Signed)
E-Visit for Sore Throat - Strep Symptoms  We are sorry that you are not feeling well.  Here is how we plan to help!  Based on what you have shared with me it is likely that you have strep pharyngitis.  Strep pharyngitis is inflammation and infection in the back of the throat.  This is an infection cause by bacteria and is treated with antibiotics.  I have prescribed Amoxicillin 500 mg twice a day for 10 days. For throat pain, we recommend over the counter oral pain relief medications such as acetaminophen or aspirin, or anti-inflammatory medications such as ibuprofen or naproxen sodium. Topical treatments such as oral throat lozenges or sprays may be used as needed. Strep infections are not as easily transmitted as other respiratory infections, however we still recommend that you avoid close contact with loved ones, especially the very young and elderly.  Remember to wash your hands thoroughly throughout the day as this is the number one way to prevent the spread of infection and wipe down door knobs and counters with disinfectant.   Home Care: Only take medications as instructed by your medical team. Complete the entire course of an antibiotic. Do not take these medications with alcohol. A steam or ultrasonic humidifier can help congestion.  You can place a towel over your head and breathe in the steam from hot water coming from a faucet. Avoid close contacts especially the very young and the elderly. Cover your mouth when you cough or sneeze. Always remember to wash your hands.  Get Help Right Away If: You develop worsening fever or sinus pain. You develop a severe head ache or visual changes. Your symptoms persist after you have completed your treatment plan.  Make sure you Understand these instructions. Will watch your condition. Will get help right away if you are not doing well or get worse.   Thank you for choosing an e-visit.  Your e-visit answers were reviewed by a board  certified advanced clinical practitioner to complete your personal care plan. Depending upon the condition, your plan could have included both over the counter or prescription medications.  Please review your pharmacy choice. Make sure the pharmacy is open so you can pick up prescription now. If there is a problem, you may contact your provider through [employer] and have the prescription routed to another pharmacy.  Your safety is important to [place]. If you have drug allergies check your prescription carefully.   For the next 24 hours you can use MyChart to ask questions about today's visit, request a non-urgent call back, or ask for a work or school excuse. You will get an email in the next two days asking about your experience. I hope that your e-visit has been valuable and will speed your recovery.   I have provided 5 minutes of non face to face time during this encounter for chart review and documentation.

## 2022-10-14 ENCOUNTER — Encounter: Payer: Self-pay | Admitting: Cardiovascular Disease

## 2022-10-17 ENCOUNTER — Telehealth: Payer: Self-pay | Admitting: Cardiovascular Disease

## 2022-10-17 ENCOUNTER — Telehealth: Payer: Self-pay

## 2022-10-17 MED ORDER — NEBIVOLOL HCL 10 MG PO TABS: 10.0000 mg | ORAL_TABLET | Freq: Every day | ORAL | 3 refills | Status: DC

## 2022-10-17 NOTE — Telephone Encounter (Signed)
Prior Authorization initiated for nebivolol 10 mg tablets through [email or web address] Patient also currently taking propranolol. KEY: [id number] Response: [employer] is reviewing your PA request and will respond within 24 hours for Medicaid or up to 72 hours for non-Medicaid plans, based on the required timeframe determined by state or federal regulations.

## 2022-10-17 NOTE — Telephone Encounter (Signed)
Nurse consulted with Gerrie Nordmann, NP would stated ok to send increase dose of Bystolic 10 mg daily if pt's systolic has remained above 100 with increased dose. Nurse spoke to pt. Pt voiced systolic has averaged around 110's Refill sent to pharmacy as requested

## 2022-10-17 NOTE — Telephone Encounter (Signed)
Pt c/o medication issue:  1. Name of Medication: Bystolic 10   2. How are you currently taking this medication (dosage and times per day)?   3. Are you having a reaction (difficulty breathing--STAT)?   4. What is your medication issue? Patient states she was suppose to hear back in regards to dose increase via MyChart and has been waiting to hear back. Would like a return call to discuss. She states she only has 2 pills left.

## 2022-10-17 NOTE — Telephone Encounter (Signed)
Please see updated encounter  

## 2022-10-18 ENCOUNTER — Telehealth: Payer: Self-pay

## 2022-10-18 ENCOUNTER — Other Ambulatory Visit: Payer: Self-pay | Admitting: Cardiology

## 2022-10-18 MED ORDER — CARVEDILOL 12.5 MG PO TABS: 12.5000 mg | ORAL_TABLET | Freq: Two times a day (BID) | ORAL | 0 refills | Status: DC

## 2022-10-18 NOTE — Telephone Encounter (Signed)
Per Melinda Crutch, NP, discontinue nebivolol and start carvedilol 12.5 mg BID. Patient switched insurance plans and [employer] will not cover nebivolol unless patient has tried/failed two preferred alternatives: atenolol and carvedilol. Spoke with patient and explained this to her. She expressed verbal understanding. Sherri also requested to have patient F/U with Dr. Rockey Situ two weeks after starting carvedilol. Patient scheduled for appointment on 11/11/22 at 11 am.

## 2022-10-18 NOTE — Telephone Encounter (Signed)
Patient's insurance plan was changed to [employer] Per fax received from [employer], request for nebivolol has been denied. Coverage is provided in situations where the patient has tried and failed or was intolerant to two generic beta-blockers. Coverage cannot be authorized at this time.

## 2022-10-20 ENCOUNTER — Other Ambulatory Visit: Payer: Self-pay | Admitting: Cardiovascular Disease

## 2022-10-22 MED ORDER — NEBIVOLOL HCL 10 MG PO TABS: 10.0000 mg | ORAL_TABLET | Freq: Every day | ORAL | 3 refills | Status: AC

## 2022-10-22 NOTE — Addendum Note (Signed)
Addended by: Valora Corporal on: 10/22/2022 12:25 PM   Modules accepted: Orders

## 2022-10-22 NOTE — Telephone Encounter (Signed)
Pt is calling back in regards to this medication and would like to continue with nebivolol 10 mg and would like to discuss this. Please advise.

## 2022-10-22 NOTE — Telephone Encounter (Signed)
Spoke with patient and reviewed that provider approved her switch back to her bystolic. She was appreciative for the call with no further questions at this time.

## 2022-11-10 NOTE — Progress Notes (Deleted)
Date:  11/10/2022   ID:  Chauncey Mann, DOB 03-08-1981, MRN [id number]  Patient Location:  [place] [address]   Provider location:   [facility], [place] office  PCP:  Crecencio Mc, MD  Cardiologist:  [facility]  No chief complaint on file.   History of Present Illness:    Molly Stone is a 42 y.o. female past medical history of Atrial tachycardia/paroxysmal tachycardia associated with alcohol intake, walking up steps in [place] hypertension .  obesity OSA  Not on CPAP Who presents for follow-up of her tachycardia/palpitations  Last seen in clinic April 2023  Tachycardia relatively well controlled Bystolic 5 once a day Rarely needs to take extra medication Has not had to take propranolol  Long work hours, Teenagers at home Missing meals Normalized her divorce Has plans to get married this May 2023  No regular exercise program, tired at the end of the day Attributes this to long working hours  EKG personally reviewed by myself on todays visit NSR rate 82 bpm, no st or t wave  Other past medical history reviewed Event monitor,  Normal sinus rhythm Avg HR of 92 bpm.  1 run of Ventricular Tachycardia occurred lasting 16 beats with a max rate of 203 bpm (avg 181 bpm).  1 run of Supraventricular Tachycardia occurred lasting 4 beats with a max rate of 158 bpm (avg 144 bpm).  Isolated SVEs were rare (<1.0%), SVE Triplets were rare (<1.0%), and no SVE Couplets were present. Isolated VEs were rare (<1.0%), and no VE Couplets or VE Triplets were present. Patient triggered events were not associated with significant arrhythmia   Echocardiogram confirming ejection fraction greater than 55%   Previously with panic attacks Earlier in 2020 had bronchitis,    Over the summer 2019, After eating out with alcoholic  drink, Developed faster heart rate, 146 bpm, would not slow down, Diaphoretic, 1 1/2 hours for it to  go away Eventually resolved on its own   Second time, again had a alcoholic drink, whisky, at a party Developed tachycardia again   Additional episode of tachycardia when walking in [place] It was hot, walking up the [facility] steps Had tachycardia had to sit down and recover   Past Medical History:  Diagnosis Date   Allergy    Anemia    Anxiety    Asthma    Chlamydia    h/o   Concussion without loss of consciousness 08/04/2016   DDD (degenerative disc disease), cervical 04/07/2019   Disorder of vocal cord    spasmodic dysphonia   Dysmenorrhea    Dyspareunia, female    Endometriosis    Family history of endometriosis    GERD (gastroesophageal reflux disease)    Headache    migraine   Heavy periods    Hiatal hernia    History of nephrolithiasis    Hypertension    Jaundice, physiologic, newborn    Lupus ([place])    Obesity (BMI 30-39.9)    Sleep apnea    Tobacco user    Vaginal Pap smear, abnormal    Past Surgical History:  Procedure Laterality Date   BONE MARROW BIOPSY     DIAGNOSTIC LAPAROSCOPY     DILATATION & CURETTAGE/HYSTEROSCOPY WITH MYOSURE N/A 08/27/2018   Procedure: DILATATION & CURETTAGE/HYSTEROSCOPY WITH MINERVA;  Surgeon: Gae Dry, MD;  Location: [facility] ORS;  Service: Gynecology;  Laterality: N/A;   DILATION AND CURETTAGE  OF UTERUS     ESOPHAGOGASTRODUODENOSCOPY (EGD) WITH PROPOFOL N/A 08/07/2018   Procedure: ESOPHAGOGASTRODUODENOSCOPY (EGD) WITH PROPOFOL;  Surgeon: Jonathon Bellows, MD;  Location: [facility] ENDOSCOPY;  Service: Gastroenterology;  Laterality: N/A;   ESOPHAGOGASTRODUODENOSCOPY (EGD) WITH PROPOFOL N/A 07/20/2021   Procedure: ESOPHAGOGASTRODUODENOSCOPY (EGD) WITH PROPOFOL;  Surgeon: Jonathon Bellows, MD;  Location: [facility] ENDOSCOPY;  Service: Gastroenterology;  Laterality: N/A;   laparoscopy     ROOT CANAL     WISDOM TOOTH EXTRACTION        Allergies:   Buprenorphine hcl, Morphine and related, Prednisone, and Tramadol hcl   Social History    Tobacco Use   Smoking status: Former    Packs/day: 0.50    Years: 10.00    Total pack years: 5.00    Types: Cigarettes    Quit date: 03/2022    Years since quitting: 0.6   Smokeless tobacco: Never  Vaping Use   Vaping Use: Former  Substance Use Topics   Alcohol use: Yes    Alcohol/week: 0.0 standard drinks of alcohol    Comment: rarely   Drug use: No     Current Outpatient Medications on File Prior to Visit  Medication Sig Dispense Refill   ADVAIR HFA 230-21 MCG/ACT inhaler USE 2 INHALATIONS TWICE A DAY 36 g 3   albuterol (VENTOLIN HFA) 108 (90 Base) MCG/ACT inhaler USE 2 INHALATIONS EVERY 6 HOURS AS NEEDED FOR WHEEZING OR SHORTNESS OF BREATH 8.5 g 1   ALPRAZolam (XANAX) 0.25 MG tablet TAKE 1 TABLET BY MOUTH AS NEEDED FOR PANIC ATTACKS (Patient not taking: Reported on 06/05/2022) 20 tablet 0   Azelastine HCl 137 MCG/SPRAY SOLN USE 1 TO 2 SPRAYS IN EACH NOSTRIL TWICE A DAY AS NEEDED FOR RHINITIS, NASAL DRAINAGE AS DIRECTED (Patient not taking: Reported on 06/05/2022) 30 mL 0   Cholecalciferol (VITAMIN D3) 125 MCG (5000 UT) TABS Take 1 tablet by mouth daily.     EPINEPHrine 0.3 mg/0.3 mL IJ SOAJ injection See admin instructions. for allergic reaction     fluticasone (FLONASE) 50 MCG/ACT nasal spray USE 1 SPRAY IN EACH NOSTRIL TWICE A DAY AS NEEDED FOR ALLERGIES OR RHINITIS (NASAL CONGESTION) 48 g 3   montelukast (SINGULAIR) 10 MG tablet Take 1 tablet (10 mg total) by mouth at bedtime. 90 tablet 3   mupirocin ointment (BACTROBAN) 2 % Apply topically 3 (three) times daily.     nebivolol (BYSTOLIC) 10 MG tablet Take 1 tablet (10 mg total) by mouth daily. 90 tablet 3   pantoprazole (PROTONIX) 40 MG tablet TAKE 1 TABLET TWICE A DAY 180 tablet 1   predniSONE (DELTASONE) 10 MG tablet Take 2 tablets (20 mg total) by mouth daily with breakfast. 5 tablet 0   propranolol (INDERAL) 10 MG tablet Take 1 tablet (10 mg total) by mouth 3 (three) times daily. Take only as needed for tachycardia  (Patient not taking: Reported on 06/05/2022) 60 tablet 3   No current facility-administered medications on file prior to visit.     Family Hx: The patient's family history includes Allergic rhinitis in her mother; Breast cancer in her cousin; CAD (age of onset: 95) in her brother; Cancer in her father; Cancer (age of onset: 62) in her maternal grandmother; Cancer (age of onset: 84) in her maternal grandfather; Diabetes in her maternal uncle; Endometriosis in her mother; Heart disease in her maternal grandfather and paternal grandmother; Immunodeficiency in her brother; Lupus in her father; Osteoporosis in her maternal grandmother; Ovarian cancer in her maternal grandmother; Stroke in  her father. There is no history of Colon cancer, Kidney cancer, Bladder Cancer, Angioedema, Atopy, Asthma, Eczema, or Urticaria.  ROS:   Please see the history of present illness.    Review of Systems  Constitutional: Negative.   HENT: Negative.    Respiratory: Negative.    Cardiovascular: Negative.   Gastrointestinal: Negative.   Musculoskeletal: Negative.   Neurological: Negative.   Psychiatric/Behavioral: Negative.    All other systems reviewed and are negative.    Labs/Other Tests and Data Reviewed:    Recent Labs: 11/30/2021: BUN 10; Creatinine, Ser 0.64; Potassium 3.6; Sodium 136; TSH 0.956 12/06/2021: Hemoglobin 14.1; Platelets 188   Recent Lipid Panel Lab Results  Component Value Date/Time   CHOL 205 (H) 09/02/2019 09:19 AM   CHOL 210 (H) 04/08/2019 09:49 AM   TRIG 57.0 09/02/2019 09:19 AM   HDL 43.90 09/02/2019 09:19 AM   HDL 46 04/08/2019 09:49 AM   CHOLHDL 5 09/02/2019 09:19 AM   LDLCALC 149 (H) 09/02/2019 09:19 AM   LDLCALC 151 (H) 04/08/2019 09:49 AM   LDLDIRECT 155 (H) 08/18/2015 03:11 PM    Wt Readings from Last 3 Encounters:  06/05/22 254 lb (115.2 kg)  03/08/22 254 lb (115.2 kg)  01/22/22 254 lb (115.2 kg)     Exam:    There were no vitals taken for this visit.   Constitutional:  oriented to person, place, and time. No distress.  HENT:  Head: Grossly normal Eyes:  no discharge. No scleral icterus.  Neck: No JVD, no carotid bruits  Cardiovascular: Regular rate and rhythm, no murmurs appreciated Pulmonary/Chest: Clear to auscultation bilaterally, no wheezes or rails Abdominal: Soft.  no distension.  no tenderness.  Musculoskeletal: Normal range of motion Neurological:  normal muscle tone. Coordination normal. No atrophy Skin: Skin warm and dry Psychiatric: normal affect, pleasant  ASSESSMENT & PLAN:    Problem List Items Addressed This Visit   None Paroxysmal tachycardia Well-controlled on bystolic 5 mg daily propranolol 10 as needed Refills provided, no changes, no further work-up  Hyperlipidemia On Ozempic We have encouraged continued exercise, careful diet management in an effort to lose weight.  Obesity On Ozempic Missing meals at work   Total encounter time more than 30 minutes  Greater than 50% was spent in counseling and coordination of care with the patient   Signed, Ida Rogue, [place] [facility] [address], [place], [place] [address]

## 2022-11-11 ENCOUNTER — Ambulatory Visit: Payer: Self-pay | Attending: Cardiovascular Disease | Admitting: Cardiovascular Disease

## 2022-11-12 ENCOUNTER — Encounter: Payer: Self-pay | Admitting: Cardiovascular Disease

## 2023-01-10 ENCOUNTER — Encounter: Payer: Self-pay | Admitting: Cardiovascular Disease

## 2023-01-10 ENCOUNTER — Telehealth: Payer: Self-pay | Admitting: *Deleted

## 2023-01-10 ENCOUNTER — Other Ambulatory Visit: Payer: Self-pay | Admitting: *Deleted

## 2023-01-10 DIAGNOSIS — I471 Supraventricular tachycardia, unspecified: Secondary | ICD-10-CM

## 2023-01-10 DIAGNOSIS — I479 Paroxysmal tachycardia, unspecified: Secondary | ICD-10-CM

## 2023-01-10 NOTE — Telephone Encounter (Signed)
Can you please do that

## 2023-01-10 NOTE — Telephone Encounter (Signed)
Patient called and states that she moved to [facility] 6 months ago and needs a referral from [place] to [facility]emat[facility]

## 2023-01-10 NOTE — Telephone Encounter (Signed)
Called and spoke with the office to see if they require the referral. Raquel Sarna with that office confirmed she does need referral, last ov note, and demographics. Will review with provider for referral.

## 2023-01-10 NOTE — Telephone Encounter (Signed)
I called the [facility] oncology and hematology and got fax [phone]. I told the staff member that pt moved to [facility] for 6 months and needs referral to start seeing their office and I do not know the new address but her cell phone [phone]. I faxed the notes and waiting to see if it goes through.

## 2023-01-10 NOTE — Progress Notes (Signed)
Patient moving to [facility] area and requested referral to [facility]. Called that office and they provided different fax number to send records. Patient made aware this has been sent.   Fax# [phone]

## 2023-01-14 ENCOUNTER — Telehealth: Payer: Self-pay | Admitting: *Deleted

## 2023-01-14 NOTE — Telephone Encounter (Signed)
Rn contacted [facility] to f/u on pt's referral.  Confirmed that Pt's referral has been received but it's in review by the hematology team. The office will be in touch with the patient once the referral has been reviewed by team.

## 2023-01-21 ENCOUNTER — Telehealth: Payer: Self-pay | Admitting: Cardiovascular Disease

## 2023-01-21 NOTE — Telephone Encounter (Signed)
Patient states that she needs referral given from MyChart message needs to be sent to a different number.   Phone # [phone].

## 2023-01-21 NOTE — Telephone Encounter (Signed)
Left voicemail message that referral was faxed to that number but if she should have any further questions to give [place] a call back.

## 2023-09-25 ENCOUNTER — Other Ambulatory Visit: Payer: Self-pay | Admitting: Cardiology
